# Patient Record
Sex: Female | Born: 1935 | Race: White | Hispanic: No | Smoking: Former smoker
Health system: Southern US, Community
[De-identification: ages and names within clinical notes are randomized; demographics above are authoritative.]

## PROBLEM LIST (undated history)

## (undated) DIAGNOSIS — I5030 Unspecified diastolic (congestive) heart failure: Secondary | ICD-10-CM

## (undated) DIAGNOSIS — E039 Hypothyroidism, unspecified: Secondary | ICD-10-CM

## (undated) DIAGNOSIS — E119 Type 2 diabetes mellitus without complications: Secondary | ICD-10-CM

## (undated) DIAGNOSIS — N189 Chronic kidney disease, unspecified: Secondary | ICD-10-CM

## (undated) DIAGNOSIS — J449 Chronic obstructive pulmonary disease, unspecified: Secondary | ICD-10-CM

## (undated) DIAGNOSIS — F32A Depression, unspecified: Secondary | ICD-10-CM

## (undated) DIAGNOSIS — I255 Ischemic cardiomyopathy: Secondary | ICD-10-CM

## (undated) DIAGNOSIS — I10 Essential (primary) hypertension: Secondary | ICD-10-CM

## (undated) DIAGNOSIS — M5410 Radiculopathy, site unspecified: Secondary | ICD-10-CM

## (undated) DIAGNOSIS — K5792 Diverticulitis of intestine, part unspecified, without perforation or abscess without bleeding: Secondary | ICD-10-CM

## (undated) DIAGNOSIS — M1990 Unspecified osteoarthritis, unspecified site: Secondary | ICD-10-CM

## (undated) DIAGNOSIS — J45909 Unspecified asthma, uncomplicated: Secondary | ICD-10-CM

## (undated) DIAGNOSIS — Z950 Presence of cardiac pacemaker: Secondary | ICD-10-CM

## (undated) DIAGNOSIS — E785 Hyperlipidemia, unspecified: Secondary | ICD-10-CM

## (undated) DIAGNOSIS — I509 Heart failure, unspecified: Secondary | ICD-10-CM

## (undated) DIAGNOSIS — I2510 Atherosclerotic heart disease of native coronary artery without angina pectoris: Secondary | ICD-10-CM

## (undated) DIAGNOSIS — I499 Cardiac arrhythmia, unspecified: Secondary | ICD-10-CM

## (undated) DIAGNOSIS — D649 Anemia, unspecified: Secondary | ICD-10-CM

## (undated) DIAGNOSIS — I4819 Other persistent atrial fibrillation: Secondary | ICD-10-CM

## (undated) DIAGNOSIS — I219 Acute myocardial infarction, unspecified: Secondary | ICD-10-CM

## (undated) DIAGNOSIS — I519 Heart disease, unspecified: Secondary | ICD-10-CM

## (undated) DIAGNOSIS — F329 Major depressive disorder, single episode, unspecified: Secondary | ICD-10-CM

## (undated) DIAGNOSIS — K219 Gastro-esophageal reflux disease without esophagitis: Secondary | ICD-10-CM

## (undated) HISTORY — DX: Atherosclerotic heart disease of native coronary artery without angina pectoris: I25.10

## (undated) HISTORY — DX: Essential (primary) hypertension: I10

## (undated) HISTORY — DX: Type 2 diabetes mellitus without complications: E11.9

## (undated) HISTORY — DX: Acute myocardial infarction, unspecified: I21.9

## (undated) HISTORY — DX: Unspecified diastolic (congestive) heart failure: I50.30

## (undated) HISTORY — DX: Radiculopathy, site unspecified: M54.10

## (undated) HISTORY — DX: Hypothyroidism, unspecified: E03.9

## (undated) HISTORY — PX: JOINT REPLACEMENT: SHX530

## (undated) HISTORY — DX: Hyperlipidemia, unspecified: E78.5

## (undated) HISTORY — DX: Cardiac arrhythmia, unspecified: I49.9

## (undated) HISTORY — DX: Unspecified asthma, uncomplicated: J45.909

## (undated) HISTORY — PX: CARDIAC CATHETERIZATION: SHX172

## (undated) HISTORY — DX: Major depressive disorder, single episode, unspecified: F32.9

## (undated) HISTORY — DX: Depression, unspecified: F32.A

## (undated) HISTORY — DX: Diverticulitis of intestine, part unspecified, without perforation or abscess without bleeding: K57.92

## (undated) HISTORY — DX: Heart failure, unspecified: I50.9

## (undated) HISTORY — DX: Heart disease, unspecified: I51.9

## (undated) HISTORY — PX: FOOT SURGERY: SHX648

## (undated) HISTORY — DX: Unspecified osteoarthritis, unspecified site: M19.90

## (undated) HISTORY — PX: APPENDECTOMY: SHX54

## (undated) HISTORY — PX: ABDOMINAL HYSTERECTOMY: SHX81

## (undated) HISTORY — DX: Chronic obstructive pulmonary disease, unspecified: J44.9

## (undated) HISTORY — DX: Gastro-esophageal reflux disease without esophagitis: K21.9

---

## 1993-12-11 HISTORY — PX: CORONARY ANGIOPLASTY WITH STENT PLACEMENT: SHX49

## 2012-09-11 HISTORY — PX: INSERT / REPLACE / REMOVE PACEMAKER: SUR710

## 2013-06-03 LAB — LIPID PANEL
Cholesterol: 191 mg/dL (ref 0–200)
HDL: 50 mg/dL (ref 35–70)
LDL Cholesterol: 101 mg/dL
Triglycerides: 121 mg/dL (ref 40–160)

## 2013-06-03 LAB — HEPATIC FUNCTION PANEL
ALT: 12 U/L (ref 7–35)
AST: 15 U/L (ref 13–35)
Alkaline Phosphatase: 59 U/L (ref 25–125)
Bilirubin, Total: 0.2 mg/dL

## 2013-06-03 LAB — BASIC METABOLIC PANEL
BUN: 25 mg/dL — AB (ref 4–21)
Creatinine: 1 mg/dL (ref 0.5–1.1)
Glucose: 138 mg/dL
POTASSIUM: 4.1 mmol/L (ref 3.4–5.3)
SODIUM: 133 mmol/L — AB (ref 137–147)

## 2013-06-03 LAB — HEMOGLOBIN A1C: HEMOGLOBIN A1C: 6.6 % — AB (ref 4.0–6.0)

## 2014-03-14 LAB — HM DIABETES EYE EXAM

## 2014-04-20 ENCOUNTER — Ambulatory Visit (INDEPENDENT_AMBULATORY_CARE_PROVIDER_SITE_OTHER): Payer: Medicare Other | Admitting: Nurse Practitioner

## 2014-04-20 ENCOUNTER — Encounter: Payer: Self-pay | Admitting: Nurse Practitioner

## 2014-04-20 VITALS — BP 136/72 | HR 74 | Temp 98.0°F | Resp 14 | Ht 59.0 in | Wt 204.5 lb

## 2014-04-20 DIAGNOSIS — Z7189 Other specified counseling: Secondary | ICD-10-CM

## 2014-04-20 DIAGNOSIS — Z7689 Persons encountering health services in other specified circumstances: Secondary | ICD-10-CM | POA: Insufficient documentation

## 2014-04-20 DIAGNOSIS — E119 Type 2 diabetes mellitus without complications: Secondary | ICD-10-CM | POA: Diagnosis not present

## 2014-04-20 DIAGNOSIS — M5442 Lumbago with sciatica, left side: Secondary | ICD-10-CM | POA: Diagnosis not present

## 2014-04-20 DIAGNOSIS — Z950 Presence of cardiac pacemaker: Secondary | ICD-10-CM

## 2014-04-20 DIAGNOSIS — M25462 Effusion, left knee: Secondary | ICD-10-CM

## 2014-04-20 DIAGNOSIS — E669 Obesity, unspecified: Secondary | ICD-10-CM

## 2014-04-20 DIAGNOSIS — E1169 Type 2 diabetes mellitus with other specified complication: Secondary | ICD-10-CM

## 2014-04-20 NOTE — Progress Notes (Signed)
Pre visit review using our clinic review tool, if applicable. No additional management support is needed unless otherwise documented below in the visit note. 

## 2014-04-20 NOTE — Progress Notes (Signed)
Subjective:    Patient ID: Evelyn Simmons, female    DOB: December 15, 1929, 81 y.o.   MRN: [id number]  HPI  Evelyn Simmons is a 81 yo female establishing care. Is accompanied by daughter.   1) Health Maintenance-   Diet- Balanced meals 3 daily  Exercise- Sedentary, has a cane to walk, walker at home pain and breathing   Immunizations- Unsure  Mammogram- N/A  Bone Density- Unsure   Colonoscopy- N/A  Eye Exam- UTD  Dental Exam- UTD- false teeth  2) Chronic Problems-  Pace maker- 71 BPM is the setting   2 L of Oxygen at night   3) Acute Problems-  Moved from [place], knee pain left knee. Bilateral knee replacement, but left knee has sharp pains and is swollen. She has trouble ambulating because of the pain. She is currently on hydrocodone and gabapentin. Pt also reports bilateral lower back pain and left leg radiation to knee.    Review of Systems  Constitutional: Negative for fever, chills, diaphoresis and fatigue.  Respiratory: Negative for chest tightness, shortness of breath and wheezing.   Cardiovascular: Positive for leg swelling. Negative for chest pain and palpitations.  Gastrointestinal: Negative for nausea, vomiting, abdominal pain and diarrhea.  Musculoskeletal: Positive for back pain, arthralgias and gait problem.       Ambulates with cane Left knee pain  Skin: Negative for rash.  Neurological: Negative for dizziness, weakness, numbness and headaches.  Psychiatric/Behavioral: The patient is not nervous/anxious.    Past Medical History  Diagnosis Date  . Asthma   . Arthritis   . Depression   . Diabetes mellitus without complication   . COPD (chronic obstructive pulmonary disease)   . Diverticulitis   . GERD (gastroesophageal reflux disease)   . Cardiac arrhythmia   . Heart disease   . Hyperlipidemia   . Hypertension   . Hypothyroid     pt unsure, not currently on medication    History   Social History  . Marital Status: Widowed    Spouse Name: N/A  . Number  of Children: N/A  . Years of Education: N/A   Occupational History  . Not on file.   Social History Main Topics  . Smoking status: Former Smoker    Quit date: 12/10/1993  . Smokeless tobacco: Former Systems developer  . Alcohol Use: 0.0 oz/week    0 Standard drinks or equivalent per week     Comment: Rare  . Drug Use: No  . Sexual Activity: Not Currently   Other Topics Concern  . Not on file   Social History Narrative   Moved from [place] with eldest daughter   7 children, 11 grandchildren, 6 GG children        Past Surgical History  Procedure Laterality Date  . Appendectomy    . Abdominal hysterectomy    . Foot surgery Bilateral     Over 25 years ago- Bunionectomy  . Joint replacement      bilateral knee replacements    Family History  Problem Relation Age of Onset  . Arthritis Mother   . Heart disease Mother   . Hypertension Mother   . Diabetes Mother   . Arthritis Father   . Heart disease Father   . Hypertension Father   . Cancer Sister     breast cancer  . Diabetes Brother   . Cancer Daughter     lung cancer  . Diabetes Sister     Allergies  Allergen Reactions  .  Sulfa Antibiotics     Pt unsure reaction, was told in hospital she had allergy  . Penicillins Nausea And Vomiting and Rash    No current outpatient prescriptions on file prior to visit.   No current facility-administered medications on file prior to visit.      Objective:   Physical Exam  Constitutional: She is oriented to person, place, and time. She appears well-developed and well-nourished. No distress.  BP 136/72 mmHg  Pulse 74  Temp(Src) 98 F (36.7 C) (Oral)  Resp 14  Ht 4\' 11"  (1.499 m)  Wt 204 lb 8 oz (92.761 kg)  BMI 41.28 kg/m2  SpO2 95%  HENT:  Head: Normocephalic and atraumatic.  Right Ear: External ear normal.  Left Ear: External ear normal.  Eyes: Right eye exhibits no discharge. Left eye exhibits no discharge. No scleral icterus.  Cardiovascular: Normal rate,  regular rhythm, normal heart sounds and intact distal pulses.  Exam reveals no gallop and no friction rub.   No murmur heard. Pulmonary/Chest: Effort normal. No respiratory distress. She has wheezes. She has no rales. She exhibits no tenderness.  Musculoskeletal: She exhibits edema and tenderness.  Left knee decreased ROM and effusion  Neurological: She is alert and oriented to person, place, and time. No cranial nerve deficit. She exhibits normal muscle tone. Coordination normal.  Skin: Skin is warm and dry. No rash noted. She is not diaphoretic.  Psychiatric: She has a normal mood and affect. Her behavior is normal. Judgment and thought content normal.      Assessment & Plan:

## 2014-04-20 NOTE — Patient Instructions (Addendum)
Welcome to [facility]!   We will contact you with your referrals- Cardiology, Ortho, and Endocrine.   Follow up next month to see how you are doing.

## 2014-04-21 ENCOUNTER — Encounter: Payer: Self-pay | Admitting: Nurse Practitioner

## 2014-04-21 DIAGNOSIS — M5442 Lumbago with sciatica, left side: Secondary | ICD-10-CM | POA: Insufficient documentation

## 2014-04-21 DIAGNOSIS — E1169 Type 2 diabetes mellitus with other specified complication: Secondary | ICD-10-CM | POA: Insufficient documentation

## 2014-04-21 DIAGNOSIS — Z950 Presence of cardiac pacemaker: Secondary | ICD-10-CM | POA: Insufficient documentation

## 2014-04-21 DIAGNOSIS — M25469 Effusion, unspecified knee: Secondary | ICD-10-CM | POA: Insufficient documentation

## 2014-04-21 DIAGNOSIS — E669 Obesity, unspecified: Secondary | ICD-10-CM

## 2014-04-21 DIAGNOSIS — N183 Chronic kidney disease, stage 3 unspecified: Secondary | ICD-10-CM | POA: Insufficient documentation

## 2014-04-21 NOTE — Assessment & Plan Note (Signed)
Discussed acute and chronic issues. Reviewed health maintenance measures, PFSHx, and immunizations. Referrals to Orthopedics, Cardiology, and Endocrinology.

## 2014-04-21 NOTE — Assessment & Plan Note (Addendum)
Needs to establish with a Cardiologist for any pacemaker and heart related needs she may have in the future. She reports having her pacemaker set to 71 BPM

## 2014-04-21 NOTE — Assessment & Plan Note (Signed)
Gait is off due to left knee effusion and pain. Knee was replaced in the past and has been problematic ever since. Referral to Ortho for further evaluation.

## 2014-04-21 NOTE — Assessment & Plan Note (Signed)
Low back pain with left leg radiculopathy with her left knee effusion. Will refer to ortho for further evaluation and treatment.

## 2014-04-21 NOTE — Assessment & Plan Note (Signed)
Pt was seeing an endocrinologist in the past about her diabetes. Will refer to Dr. Howell Rucks for further evaluation and treatment.

## 2014-04-28 ENCOUNTER — Other Ambulatory Visit: Payer: Self-pay | Admitting: *Deleted

## 2014-04-28 ENCOUNTER — Telehealth: Payer: Self-pay | Admitting: *Deleted

## 2014-04-28 MED ORDER — AMLODIPINE BESYLATE 2.5 MG PO TABS: 2.5000 mg | ORAL_TABLET | Freq: Every day | ORAL | Status: DC

## 2014-04-28 MED ORDER — ALPRAZOLAM 0.5 MG PO TABS: 0.5000 mg | ORAL_TABLET | Freq: Two times a day (BID) | ORAL | Status: DC | PRN

## 2014-04-28 NOTE — Telephone Encounter (Signed)
pts daughter called reports pt fell on Sunday night had left knee injury, was seen at [place].

## 2014-04-28 NOTE — Telephone Encounter (Signed)
Thanks for letting me know!

## 2014-05-09 ENCOUNTER — Encounter: Payer: Self-pay | Admitting: Cardiovascular Disease

## 2014-05-09 ENCOUNTER — Ambulatory Visit (INDEPENDENT_AMBULATORY_CARE_PROVIDER_SITE_OTHER): Payer: Medicare Other | Admitting: Cardiovascular Disease

## 2014-05-09 VITALS — BP 152/78 | HR 72 | Ht 59.0 in | Wt 204.8 lb

## 2014-05-09 DIAGNOSIS — Z955 Presence of coronary angioplasty implant and graft: Secondary | ICD-10-CM

## 2014-05-09 DIAGNOSIS — E1169 Type 2 diabetes mellitus with other specified complication: Secondary | ICD-10-CM

## 2014-05-09 DIAGNOSIS — E669 Obesity, unspecified: Secondary | ICD-10-CM

## 2014-05-09 DIAGNOSIS — E785 Hyperlipidemia, unspecified: Secondary | ICD-10-CM

## 2014-05-09 DIAGNOSIS — E119 Type 2 diabetes mellitus without complications: Secondary | ICD-10-CM

## 2014-05-09 DIAGNOSIS — I2510 Atherosclerotic heart disease of native coronary artery without angina pectoris: Secondary | ICD-10-CM | POA: Diagnosis not present

## 2014-05-09 DIAGNOSIS — Z950 Presence of cardiac pacemaker: Secondary | ICD-10-CM | POA: Diagnosis not present

## 2014-05-09 MED ORDER — ROSUVASTATIN CALCIUM 40 MG PO TABS: 40.0000 mg | ORAL_TABLET | Freq: Every day | ORAL | Status: DC

## 2014-05-09 MED ORDER — AMLODIPINE BESYLATE 2.5 MG PO TABS: 2.5000 mg | ORAL_TABLET | Freq: Every day | ORAL | Status: DC

## 2014-05-09 MED ORDER — FUROSEMIDE 40 MG PO TABS: 40.0000 mg | ORAL_TABLET | Freq: Two times a day (BID) | ORAL | Status: DC

## 2014-05-09 MED ORDER — AMIODARONE HCL 200 MG PO TABS: 200.0000 mg | ORAL_TABLET | Freq: Every day | ORAL | Status: DC

## 2014-05-09 NOTE — Progress Notes (Signed)
Patient ID: Evelyn Simmons, female    DOB: [birth year], 81 y.o.   MRN: [id number]  HPI Comments: [name] is a very pleasant 81 year old woman with a history of coronary artery disease dating back to October 1995 at which time she had 2 stents placed to her RCA, 2 additional stents at a later date that she is unaware of the details, bilateral total knee replacements, pacemaker in August 2014 presumably for sick sinus syndrome who presents to establish care in the [place] office.  She denies any significant shortness of breath or chest pain with exertion. She is relatively sedentary given her leg weakness. She has had falls in the past. She walks with a walker, reports having significant discomfort in her left leg with weakness, sciatica. She is scheduled to see orthopedics 05/19/2014. She wonders if it could be her hip. She's not had her pacemaker checked since 2014. It is a Sports coach K-1 [id number] implant August 2014  Daughter who presents with her reports that the patient is very sedentary at baseline, no regular exercise. She recently fell, and because of her injury was in bed for approximately 2 weeks during which time she became very weak. She is interested in PT for left leg weakness, gait instability. She will talk with orthopedics to order this.  EKG on today's visit shows normal sinus rhythm with rate 72 bpm, no significant ST or T-wave changes   Allergies  Allergen Reactions  . Sulfa Antibiotics     Pt unsure reaction, was told in hospital she had allergy  . Penicillins Nausea And Vomiting and Rash    Outpatient Encounter Prescriptions as of 05/09/2014  Medication Sig  . ALPRAZolam (XANAX) 0.5 MG tablet Take 1 tablet (0.5 mg total) by mouth 2 (two) times daily as needed for anxiety.  Marland Kitchen amiodarone (PACERONE) 200 MG tablet Take 1 tablet (200 mg total) by mouth daily.  Marland Kitchen amLODipine (NORVASC) 2.5 MG tablet Take 1 tablet (2.5 mg total) by mouth daily.  Marland Kitchen aspirin EC 81  MG tablet Take 81 mg by mouth daily.  . ferrous sulfate 325 (65 FE) MG tablet Take 325 mg by mouth 3 (three) times daily with meals.  . furosemide (LASIX) 40 MG tablet Take 1 tablet (40 mg total) by mouth 2 (two) times daily.  Marland Kitchen gabapentin (NEURONTIN) 300 MG capsule Take 300 mg by mouth 3 (three) times daily.  . Glucose Blood (PRODIGY AUTOCODE BLOOD GLUCOSE VI) by In Vitro route.  Marland Kitchen HYDROcodone-acetaminophen (NORCO) 10-325 MG per tablet Take 1 tablet by mouth every 6 (six) hours as needed.  . Liraglutide 18 MG/3ML SOPN Inject 0.6 mg into the skin daily.  . pantoprazole (PROTONIX) 40 MG tablet Take 40 mg by mouth daily.  . rosuvastatin (CRESTOR) 40 MG tablet Take 1 tablet (40 mg total) by mouth daily.  . traZODone (DESYREL) 100 MG tablet Take 100 mg by mouth at bedtime.  . [DISCONTINUED] amiodarone (PACERONE) 200 MG tablet Take 200 mg by mouth daily.  . [DISCONTINUED] amLODipine (NORVASC) 2.5 MG tablet Take 1 tablet (2.5 mg total) by mouth daily.  . [DISCONTINUED] furosemide (LASIX) 40 MG tablet Take 40 mg by mouth 2 (two) times daily.  . [DISCONTINUED] rosuvastatin (CRESTOR) 40 MG tablet Take 40 mg by mouth daily.    Past Medical History  Diagnosis Date  . Asthma   . Arthritis   . Depression   . Diabetes mellitus without complication   . COPD (chronic obstructive pulmonary disease)   . Diverticulitis   .  GERD (gastroesophageal reflux disease)   . Cardiac arrhythmia   . Heart disease   . Hyperlipidemia   . Hypertension   . Hypothyroid     pt unsure, not currently on medication  . MI (myocardial infarction)     1995  . Coronary artery disease     Past Surgical History  Procedure Laterality Date  . Appendectomy    . Abdominal hysterectomy    . Foot surgery Bilateral     Over 25 years ago- Bunionectomy  . Joint replacement      bilateral knee replacements  . Insert / replace / remove pacemaker  09/2012    Model # [id number]  serial # [id number]  . Cardiac catheterization    . Coronary  angioplasty with stent placement  12/11/1993    RCA    Social History  reports that she quit smoking about 20 years ago. She has quit using smokeless tobacco. She reports that she drinks alcohol. She reports that she does not use illicit drugs.  Family History family history includes Arthritis in her father and mother; Cancer in her daughter and sister; Diabetes in her brother, mother, and sister; Heart disease in her father and mother; Hypertension in her father and mother.   Review of Systems  Constitutional: Negative.   Respiratory: Negative.   Cardiovascular: Negative.   Gastrointestinal: Negative.   Musculoskeletal: Negative.   Skin: Negative.   Neurological: Negative.   Hematological: Negative.   Psychiatric/Behavioral: Negative.   All other systems reviewed and are negative.   BP 152/78 mmHg  Pulse 72  Ht 4\' 11"  (1.499 m)  Wt 204 lb 12 oz (92.874 kg)  BMI 41.33 kg/m2 Blood pressure with primary care recently was [id number] systolic  Physical Exam  Constitutional: She is oriented to person, place, and time. She appears well-developed and well-nourished.  HENT:  Head: Normocephalic.  Nose: Nose normal.  Mouth/Throat: Oropharynx is clear and moist.  Eyes: Conjunctivae are normal. Pupils are equal, round, and reactive to light.  Neck: Normal range of motion. Neck supple. No JVD present.  Cardiovascular: Normal rate, regular rhythm, S1 normal, S2 normal, normal heart sounds and intact distal pulses.  Exam reveals no gallop and no friction rub.   No murmur heard. Pulmonary/Chest: Effort normal and breath sounds normal. No respiratory distress. She has no wheezes. She has no rales. She exhibits no tenderness.  Abdominal: Soft. Bowel sounds are normal. She exhibits no distension. There is no tenderness.  Musculoskeletal: Normal range of motion. She exhibits no edema or tenderness.  Lymphadenopathy:    She has no cervical adenopathy.  Neurological: She is alert and oriented to  person, place, and time. Coordination normal.  Skin: Skin is warm and dry. No rash noted. No erythema.  Psychiatric: She has a normal mood and affect. Her behavior is normal. Judgment and thought content normal.    Assessment and Plan  Nursing note and vitals reviewed.

## 2014-05-09 NOTE — Patient Instructions (Addendum)
You are doing well. No medication changes were made.  We will schedule a pacemaker check  Call us if you would like to schedule PT Call the office if you would like [place] to order labs  Please call us if you have new issues that need to be addressed before your next appt.  Your physician wants you to follow-up in: 6 months.  You will receive a reminder letter in the mail two months in advance. If you don't receive a letter, please call our office to schedule the follow-up appointment.

## 2014-05-09 NOTE — Assessment & Plan Note (Signed)
Currently with no symptoms of angina. No further workup at this time. Continue current medication regimen. Records have been requested

## 2014-05-09 NOTE — Assessment & Plan Note (Signed)
We will schedule appointment with Dr. Caryl Comes, EP. She has Product/process development scientist. Presumably sick sinus syndrome placed August 2014 prior to stent placement to the RCA in October 2015

## 2014-05-09 NOTE — Assessment & Plan Note (Signed)
We have recommended that she stay on her Crestor. Lab work with primary care. Goal LDL less than 70

## 2014-05-09 NOTE — Assessment & Plan Note (Signed)
2 stents placed to her RCA. Additional stents placed but details are unavailable. We have requested records from [facility]

## 2014-05-09 NOTE — Assessment & Plan Note (Signed)
We have encouraged continued exercise, careful diet management in an effort to lose weight. Follow-up with primary care for medication adjustment if needed

## 2014-05-11 ENCOUNTER — Encounter: Payer: Self-pay | Admitting: Endocrinology

## 2014-05-11 ENCOUNTER — Ambulatory Visit (INDEPENDENT_AMBULATORY_CARE_PROVIDER_SITE_OTHER): Payer: Medicare Other | Admitting: Endocrinology

## 2014-05-11 VITALS — BP 136/78 | HR 73 | Resp 14 | Ht 59.0 in | Wt 201.5 lb

## 2014-05-11 DIAGNOSIS — E669 Obesity, unspecified: Secondary | ICD-10-CM | POA: Diagnosis not present

## 2014-05-11 DIAGNOSIS — E119 Type 2 diabetes mellitus without complications: Secondary | ICD-10-CM | POA: Diagnosis not present

## 2014-05-11 DIAGNOSIS — E785 Hyperlipidemia, unspecified: Secondary | ICD-10-CM | POA: Diagnosis not present

## 2014-05-11 DIAGNOSIS — I2510 Atherosclerotic heart disease of native coronary artery without angina pectoris: Secondary | ICD-10-CM | POA: Diagnosis not present

## 2014-05-11 DIAGNOSIS — E1169 Type 2 diabetes mellitus with other specified complication: Secondary | ICD-10-CM

## 2014-05-11 LAB — HM DIABETES FOOT EXAM: HM DIABETIC FOOT EXAM: NORMAL

## 2014-05-11 NOTE — Patient Instructions (Signed)
Check sugars 2 x daily ( before breakfast and before supper).  Record them in a log book and bring that/meter to next appointment.   Continue current medications for Diabetes- may need adjustments if A1c above target.   Will set up for brand name DM testing suuplies.   Please come back for a follow-up appointment in 4 months

## 2014-05-11 NOTE — Progress Notes (Signed)
Reason for visit-  Evelyn Simmons is a 81 y.o.-year-old female, referred by her PCP,  Doss, Velora Heckler, NP for management of Type 2 diabetes, uncontrolled, without complications. Associated hx of MI s/p stents, arrthymia s/p Pacemaker, cardiomyopathy and diastolic heart failure. Here with daughter.   Moved from [facility] to [place] about 1 month ago. Recently established care with Lorane Gell. Prior to that was seeing Dr Lucianne Lei in [place], [place]. Last seen around July 2015. Notes from Dr Reece Levy reviewed after the clinic visit.   HPI- Patient has been diagnosed with diabetes in 1995 when she had a MI. Recalls being initially on lifestyle modifications.   she has  been on insulin since a few years~2013/2014.   Patient is not sure of which medications she takes for DM. PCP notes mention her being on basal/bolus with lantus/novolog along with Victoza. Unclear what exactly the patient is taking, because she frequently changes her answers.    Pt is currently on a regimen of: - Victoza 0.6 mg Geronimo daily -lantus 10 units Reyno daily -Novolog? taking   Last hemoglobin A1c was: Reports that it was well controlled. 6.6% 05/2013 Lab Results  Component Value Date   HGBA1C 6.5 05/12/2014     Pt checks her sugars 1-2 a day . Uses Prodigy meter- prefers AccuChek glucometer. By meter review they are:  Gets them through [employer] [facility]. Date and time on meter not set right  PREMEAL Breakfast Lunch Dinner Bedtime Overall  Glucose range:     107-233  Mean/median:        POST-MEAL PC Breakfast PC Lunch PC Dinner  Glucose range:     Mean/median:      * higher sugars very occasional with dietary indiscretions.   Hypoglycemia-  No  Recent lows. Lowest sugar was n/a; she has hypoglycemia awareness at 70.  One low reading at night several months  FS 62 After skipping meals   Dietary habits- eats three times daily. Tries to limit carbs, sweetened beverages, sodas, desserts. At her heaviset 318 lbs  Was drinking  15 coke cans week now mainly water after being with daughter for past 3 weeks  Exercise- wheelchair mainly - fell recently and hurt left knee, total knee replacments b/l Weight -  Wt Readings from Last 3 Encounters:  05/11/14 201 lb 8 oz (91.4 kg)  05/09/14 204 lb 12 oz (92.874 kg)  04/20/14 204 lb 8 oz (92.761 kg)    Diabetes Complications- prior AKI secndary to NSAID ; 4 years ago  Nephropathy- Yes, ?stage 3  CKD, last BUN/creatinine- GFR  Lab Results  Component Value Date   BUN 22 05/12/2014   CREATININE 1.13 05/12/2014    05/2013- Creatinine 1.02, GFR 52, BUN 26  Retinopathy- No, Last DEE was in Feb 2016. [place].  Neuropathy- no numbness and tingling in )her feet. No known neuropathy. being treated with neurontin due to back issues/radiculopathy.   Associated history - has CAD and other cardiac hx . No prior stroke. No hypothyroidism. her last TSH was  Lab Results  Component Value Date   TSH 2.75 05/12/2014    Hyperlipidemia-  her last set of lipids were- Currently on Crestor 40. Tolerating well.   Lab Results  Component Value Date   CHOL 184 05/12/2014   HDL 42.10 05/12/2014   LDLCALC 113* 05/12/2014   TRIG 144.0 05/12/2014   CHOLHDL 4 05/12/2014    05/2013: TC 191, TG 121, LDL 101, HDL 50. Normal liver tests  Blood Pressure/HTN-  Patient's blood pressure is well controlled today on current regimen.  Pt has FH of DM in mother, most of siblngs.  I have reviewed the patient's past medical history, family and social history, surgical history, medications and allergies.  Past Medical History  Diagnosis Date  . Asthma   . Arthritis   . Depression   . Diabetes mellitus without complication   . COPD (chronic obstructive pulmonary disease)   . Diverticulitis   . GERD (gastroesophageal reflux disease)   . Cardiac arrhythmia   . Heart disease   . Hyperlipidemia   . Hypertension   . Hypothyroid     pt unsure, not currently on medication  . MI (myocardial  infarction)     1995  . Coronary artery disease   . Diastolic heart failure   . Radiculopathy    Past Surgical History  Procedure Laterality Date  . Appendectomy    . Abdominal hysterectomy    . Foot surgery Bilateral     Over 25 years ago- Bunionectomy  . Joint replacement      bilateral knee replacements  . Insert / replace / remove pacemaker  09/2012    Model # [id number]  serial # [id number]  . Cardiac catheterization    . Coronary angioplasty with stent placement  12/11/1993    RCA   Family History  Problem Relation Age of Onset  . Arthritis Mother   . Heart disease Mother   . Hypertension Mother   . Diabetes Mother   . Arthritis Father   . Heart disease Father   . Hypertension Father   . Cancer Sister     breast cancer  . Diabetes Brother   . Cancer Daughter     lung cancer  . Diabetes Sister    History   Social History  . Marital Status: Widowed    Spouse Name: N/A  . Number of Children: N/A  . Years of Education: N/A   Occupational History  . Not on file.   Social History Main Topics  . Smoking status: Former Smoker    Quit date: 12/10/1993  . Smokeless tobacco: Former Systems developer  . Alcohol Use: 0.0 oz/week    0 Standard drinks or equivalent per week     Comment: Rare  . Drug Use: No  . Sexual Activity: Not Currently   Other Topics Concern  . Not on file   Social History Narrative   Moved from [place] with eldest daughter   7 children, 11 grandchildren, 40 GG children       Current Outpatient Prescriptions on File Prior to Visit  Medication Sig Dispense Refill  . ALPRAZolam (XANAX) 0.5 MG tablet Take 1 tablet (0.5 mg total) by mouth 2 (two) times daily as needed for anxiety. 30 tablet 0  . amiodarone (PACERONE) 200 MG tablet Take 1 tablet (200 mg total) by mouth daily. 90 tablet 3  . amLODipine (NORVASC) 2.5 MG tablet Take 1 tablet (2.5 mg total) by mouth daily. 90 tablet 3  . aspirin EC 81 MG tablet Take 81 mg by mouth daily.    . ferrous  sulfate 325 (65 FE) MG tablet Take 325 mg by mouth 3 (three) times daily with meals.    . furosemide (LASIX) 40 MG tablet Take 1 tablet (40 mg total) by mouth 2 (two) times daily. 180 tablet 3  . gabapentin (NEURONTIN) 300 MG capsule Take 300 mg by mouth 3 (three) times daily.    . Glucose Blood (  PRODIGY AUTOCODE BLOOD GLUCOSE VI) by In Vitro route.    Marland Kitchen HYDROcodone-acetaminophen (NORCO) 10-325 MG per tablet Take 1 tablet by mouth every 6 (six) hours as needed.    . Liraglutide 18 MG/3ML SOPN Inject 0.6 mg into the skin daily.    . pantoprazole (PROTONIX) 40 MG tablet Take 40 mg by mouth daily.    . rosuvastatin (CRESTOR) 40 MG tablet Take 1 tablet (40 mg total) by mouth daily. 90 tablet 3  . traZODone (DESYREL) 100 MG tablet Take 100 mg by mouth at bedtime.     No current facility-administered medications on file prior to visit.   Allergies  Allergen Reactions  . Sulfa Antibiotics     Pt unsure reaction, was told in hospital she had allergy  . Penicillins Nausea And Vomiting and Rash     Review of Systems: [x]  complains of  [  ] denies General:   [  ] Recent weight change [ x ] Fatigue  [  ] Loss of appetite Eyes: [  ]  Vision Difficulty [  ]  Eye pain ENT: [  ]  Hearing difficulty [  ]  Difficulty Swallowing CVS: [  ] Chest pain [  ]  Palpitations/Irregular Heart beat [  ]  Shortness of breath lying flat [ x ] Swelling of legs Resp: [  ] Frequent Cough [  ] Shortness of Breath  [  ]  Wheezing GI: [  ] Heartburn  [  ] Nausea or Vomiting  [  ] Diarrhea [x  ] Constipation  [  ] Abdominal Pain GU: [ x ]  Polyuria  [ x ]  nocturia Bones/joints:  [  ]  Muscle aches  [  ] Joint Pain  [  ] Bone pain Skin/Hair/Nails: [  ]  Rash  [  ] New stretch marks [  ]  Itching [  ] Hair loss [  ]  Excessive hair growth Reproduction: [  ] Low sexual desire , [  ]  Women: Menstrual cycle problems [  ]  Women: Breast Discharge [  ] Men: Difficulty with erections [  ]  Men: Enlarged Breasts CNS: [  ]  Frequent Headaches [  ] Blurry vision [  ] Tremors [  ] Seizures [  ] Loss of consciousness [  ] Localized weakness Endocrine: [ x ]  Excess thirst [  ]  Feeling excessively hot [  ]  Feeling excessively cold Heme: [ x ]  Easy bruising [  ]  Enlarged glands or lumps in neck Allergy: [  ]  Food allergies [  ] Environmental allergies  PE: BP 136/78 mmHg  Pulse 73  Resp 14  Ht 4\' 11"  (1.499 m)  Wt 201 lb 8 oz (91.4 kg)  BMI 40.68 kg/m2  SpO2 95% Wt Readings from Last 3 Encounters:  05/11/14 201 lb 8 oz (91.4 kg)  05/09/14 204 lb 12 oz (92.874 kg)  04/20/14 204 lb 8 oz (92.761 kg)   GENERAL: No acute distress, well developed HEENT:  Eye exam shows normal external appearance. Oral exam shows normal mucosa .  NECK:   Neck exam shows no lymphadenopathy. No Carotids bruits. Thyroid is not enlarged and no nodules felt.  no acanthosis nigricans LUNGS:         Chest is symmetrical. Lungs are clear to auscultation.Marland Kitchen   HEART:         Heart sounds:  S1 and S2 are normal. ?systolic murmur.  ABDOMEN:  No Distention present. Liver and spleen are not palpable. No other mass or tenderness present.  EXTREMITIES:     There is min edema. 2+ DP pulses  NEUROLOGICAL:     Grossly intact.            Diabetic foot exam done with shoes and socks removed: Normal Monofilament testing bilaterally. No deformities of toes.  Right great toe nail dystrophic. Skin normal color. No open wounds. Dry skin.  MUSCULOSKELETAL:       There is no enlargement or gross deformity of the joints.  SKIN:       No rash  ASSESSMENT AND PLAN: Problem List Items Addressed This Visit      Endocrine   Diabetes mellitus type 2 in obese - Primary    Reviewed outside medical records and they were summarized in various sections of the note.  It appears from the sugar log and her last A1c that she has been very well controlled. I explained that given her age and comorbidities, I would aim for a higher A1c closer to 7.5% to reduce risk of  hypoglycemia.  She is past due for labs and will return for fasting labs this week.  At that time, she will also let me know what medications she is taking for Diabetes by looking at her supplies at home.  Will make further adjustments based on labs and corrected medication regimen reported by patient. For now, I have asked her to continue what she is doing. Will set her up for brand Accuchek meter with [facility]. She was asked to check 2 x daily.  Foot care, hypoglycemia, dietary modifications discussed in detail with patient and her daughter.        Relevant Orders   Comprehensive metabolic panel (Completed)   Hemoglobin A1c (Completed)   Microalbumin / creatinine urine ratio (Completed)   Lipid panel (Completed)   TSH (Completed)   T4, free (Completed)     Other   Hyperlipidemia    Continue current statin. Update fasting lipids later this week to assess control.       Relevant Orders   Comprehensive metabolic panel (Completed)   Hemoglobin A1c (Completed)   Microalbumin / creatinine urine ratio (Completed)   Lipid panel (Completed)   TSH (Completed)   T4, free (Completed)       - Return to clinic in 4 mo with sugar log/meter. Sooner if med changes are to be made.  [facility] 05/13/2014 1:41 PM

## 2014-05-11 NOTE — Progress Notes (Signed)
Pre visit review using our clinic review tool, if applicable. No additional management support is needed unless otherwise documented below in the visit note. 

## 2014-05-12 ENCOUNTER — Telehealth: Payer: Self-pay | Admitting: *Deleted

## 2014-05-12 ENCOUNTER — Ambulatory Visit: Payer: TRICARE For Life (TFL) | Admitting: Cardiovascular Disease

## 2014-05-12 ENCOUNTER — Other Ambulatory Visit (INDEPENDENT_AMBULATORY_CARE_PROVIDER_SITE_OTHER): Payer: Medicare Other

## 2014-05-12 DIAGNOSIS — E669 Obesity, unspecified: Secondary | ICD-10-CM

## 2014-05-12 DIAGNOSIS — E119 Type 2 diabetes mellitus without complications: Secondary | ICD-10-CM | POA: Diagnosis not present

## 2014-05-12 DIAGNOSIS — E1169 Type 2 diabetes mellitus with other specified complication: Secondary | ICD-10-CM

## 2014-05-12 DIAGNOSIS — E785 Hyperlipidemia, unspecified: Secondary | ICD-10-CM

## 2014-05-12 LAB — COMPREHENSIVE METABOLIC PANEL
ALT: 12 U/L (ref 0–35)
AST: 18 U/L (ref 0–37)
Albumin: 3.5 g/dL (ref 3.5–5.2)
Alkaline Phosphatase: 57 U/L (ref 39–117)
BILIRUBIN TOTAL: 0.3 mg/dL (ref 0.2–1.2)
BUN: 22 mg/dL (ref 6–23)
CO2: 30 meq/L (ref 19–32)
CREATININE: 1.13 mg/dL (ref 0.40–1.20)
Calcium: 10.4 mg/dL (ref 8.4–10.5)
Chloride: 104 mEq/L (ref 96–112)
GFR: 48.92 mL/min — AB (ref >60.00)
GLUCOSE: 105 mg/dL — AB (ref 70–99)
Potassium: 4.5 mEq/L (ref 3.5–5.1)
SODIUM: 141 meq/L (ref 135–145)
TOTAL PROTEIN: 6.2 g/dL (ref 6.0–8.3)

## 2014-05-12 LAB — LIPID PANEL
CHOLESTEROL: 184 mg/dL (ref 0–200)
HDL: 42.1 mg/dL (ref >39.00)
LDL Cholesterol: 113 mg/dL — ABNORMAL HIGH (ref 0–99)
NONHDL: 141.9
Total CHOL/HDL Ratio: 4
Triglycerides: 144 mg/dL (ref 0.0–149.0)
VLDL: 28.8 mg/dL (ref 0.0–40.0)

## 2014-05-12 LAB — MICROALBUMIN / CREATININE URINE RATIO
Creatinine,U: 109.2 mg/dL
Microalb Creat Ratio: 20.9 mg/g (ref 0.0–30.0)
Microalb, Ur: 22.8 mg/dL — ABNORMAL HIGH (ref 0.0–1.9)

## 2014-05-12 LAB — T4, FREE: Free T4: 0.92 ng/dL (ref 0.60–1.60)

## 2014-05-12 LAB — TSH: TSH: 2.75 u[IU]/mL (ref 0.35–4.50)

## 2014-05-12 LAB — HEMOGLOBIN A1C: HEMOGLOBIN A1C: 6.5 % (ref 4.6–6.5)

## 2014-05-12 NOTE — Telephone Encounter (Signed)
Pt left note with front desk that she is taking Novolog and Victoza every day. Called pt to clarify. States she is not taking Lantus. States she takes Victoza 0.6 mg daily and Novolog at bedtime 10 units if sugar > 150. Takes about 7:00-8:00 pm, without a snack. Denies any low readings overnight. Advised I would call back with any further advice from Dr. Howell Rucks

## 2014-05-13 ENCOUNTER — Encounter: Payer: Self-pay | Admitting: Endocrinology

## 2014-05-13 ENCOUNTER — Encounter: Payer: Self-pay | Admitting: *Deleted

## 2014-05-13 MED ORDER — ACCU-CHEK NANO SMARTVIEW W/DEVICE KIT: PACK | Status: DC

## 2014-05-13 MED ORDER — ACCU-CHEK MULTICLIX LANCETS MISC: Status: DC

## 2014-05-13 NOTE — Telephone Encounter (Signed)
Recent labs reviewed- see annotation.  Please ask her to do the following-  Check sugars 2 x daily.  Continue Victoza 0.6 mg daily.  Stop Novolog.  Donot take lantus.   Follow back in 1 month. Please make appt.  Report back sooner if seeing sugars that are higher than 200 on most occasions.

## 2014-05-13 NOTE — Telephone Encounter (Signed)
Spoke to patient and patient's daughter, verbalized understanding. 1 month appt scheduled 06/15/14

## 2014-05-13 NOTE — Assessment & Plan Note (Signed)
Reviewed outside medical records and they were summarized in various sections of the note.  It appears from the sugar log and her last A1c that she has been very well controlled. I explained that given her age and comorbidities, I would aim for a higher A1c closer to 7.5% to reduce risk of hypoglycemia.  She is past due for labs and will return for fasting labs this week.  At that time, she will also let me know what medications she is taking for Diabetes by looking at her supplies at home.  Will make further adjustments based on labs and corrected medication regimen reported by patient. For now, I have asked her to continue what she is doing. Will set her up for brand Accuchek meter with [facility]. She was asked to check 2 x daily.  Foot care, hypoglycemia, dietary modifications discussed in detail with patient and her daughter.

## 2014-05-13 NOTE — Assessment & Plan Note (Signed)
Continue current statin. Update fasting lipids later this week to assess control.

## 2014-05-16 ENCOUNTER — Ambulatory Visit: Payer: TRICARE For Life (TFL) | Admitting: Nurse Practitioner

## 2014-05-23 ENCOUNTER — Encounter: Payer: Self-pay | Admitting: Nurse Practitioner

## 2014-05-23 ENCOUNTER — Ambulatory Visit (INDEPENDENT_AMBULATORY_CARE_PROVIDER_SITE_OTHER): Payer: Medicare Other | Admitting: Nurse Practitioner

## 2014-05-23 VITALS — BP 118/62 | HR 70 | Temp 94.7°F | Resp 16 | Ht 59.0 in | Wt 206.1 lb

## 2014-05-23 DIAGNOSIS — E119 Type 2 diabetes mellitus without complications: Secondary | ICD-10-CM | POA: Diagnosis not present

## 2014-05-23 DIAGNOSIS — I2510 Atherosclerotic heart disease of native coronary artery without angina pectoris: Secondary | ICD-10-CM

## 2014-05-23 DIAGNOSIS — M25469 Effusion, unspecified knee: Secondary | ICD-10-CM

## 2014-05-23 DIAGNOSIS — E669 Obesity, unspecified: Secondary | ICD-10-CM

## 2014-05-23 DIAGNOSIS — E1169 Type 2 diabetes mellitus with other specified complication: Secondary | ICD-10-CM

## 2014-05-23 DIAGNOSIS — Z7409 Other reduced mobility: Secondary | ICD-10-CM | POA: Diagnosis not present

## 2014-05-23 DIAGNOSIS — M5442 Lumbago with sciatica, left side: Secondary | ICD-10-CM

## 2014-05-23 MED ORDER — HYDROCODONE-ACETAMINOPHEN 10-325 MG PO TABS: 1.0000 | ORAL_TABLET | ORAL | Status: DC | PRN

## 2014-05-23 NOTE — Patient Instructions (Addendum)
We will contact you about your referral for aquatic physical therapy.   We will follow up in 3 months.   We will contact the [facility] program to see if they need anything on our end.

## 2014-05-23 NOTE — Progress Notes (Signed)
Subjective:    Patient ID: Evelyn Simmons, female    DOB: [birth year], 81 y.o.   MRN: [id number]  HPI  Ms. Schinke is a 81 yo female following up for Diabetes, knee effusion, and pacemaker. She is with her daughter today.   1) Referred to ortho- Back was the main problem, lower back   Only using wheelchair Trying to get into [facility]  Already completed orientation, currently working with Education officer, museum, wants me to speak with Binnie Kand (spelling?)  Cardiology- Dr. Rockey Situ w/ EKG  Still on Crestor And Endocrinology- Dr. Howell Rucks  2) Xanax- This week last dose, cannot remember date  Norco- daily, states it stops working after 4 hours   3) Diet- Not drinking sweet drinks, no white bread or potatoes  Still eating pie, cake  Wt Readings from Last 3 Encounters:  05/23/14 206 lb 1.9 oz (93.495 kg)  05/11/14 201 lb 8 oz (91.4 kg)  05/09/14 204 lb 12 oz (92.874 kg)   Mobility- None, lying in the bed most of the time due to pain from back and into leg.   Review of Systems  Constitutional: Negative for fever, chills, diaphoresis, appetite change and fatigue.  Respiratory: Negative for chest tightness, shortness of breath and wheezing.   Cardiovascular: Negative for chest pain, palpitations and leg swelling.  Gastrointestinal: Negative for nausea, vomiting and diarrhea.  Musculoskeletal: Positive for back pain and arthralgias.  Skin: Negative for rash.  Neurological: Negative for dizziness, weakness, numbness and headaches.  Psychiatric/Behavioral: The patient is nervous/anxious.    Past Medical History  Diagnosis Date  . Asthma   . Arthritis   . Depression   . Diabetes mellitus without complication   . COPD (chronic obstructive pulmonary disease)   . Diverticulitis   . GERD (gastroesophageal reflux disease)   . Cardiac arrhythmia   . Heart disease   . Hyperlipidemia   . Hypertension   . Hypothyroid     pt unsure, not currently on medication  . MI (myocardial infarction)    1995  . Coronary artery disease   . Diastolic heart failure   . Radiculopathy     History   Social History  . Marital Status: Widowed    Spouse Name: N/A  . Number of Children: N/A  . Years of Education: N/A   Occupational History  . Not on file.   Social History Main Topics  . Smoking status: Former Smoker    Quit date: 12/10/1993  . Smokeless tobacco: Former Systems developer  . Alcohol Use: 0.0 oz/week    0 Standard drinks or equivalent per week     Comment: Rare  . Drug Use: No  . Sexual Activity: Not Currently   Other Topics Concern  . Not on file   Social History Narrative   Moved from [place] with eldest daughter   7 children, 11 grandchildren, 18 GG children        Past Surgical History  Procedure Laterality Date  . Appendectomy    . Abdominal hysterectomy    . Foot surgery Bilateral     Over 25 years ago- Bunionectomy  . Joint replacement      bilateral knee replacements  . Insert / replace / remove pacemaker  09/2012    Model # [id number]  serial # [id number]  . Cardiac catheterization    . Coronary angioplasty with stent placement  12/11/1993    RCA    Family History  Problem Relation Age of Onset  .  Arthritis Mother   . Heart disease Mother   . Hypertension Mother   . Diabetes Mother   . Arthritis Father   . Heart disease Father   . Hypertension Father   . Cancer Sister     breast cancer  . Diabetes Brother   . Cancer Daughter     lung cancer  . Diabetes Sister     Allergies  Allergen Reactions  . Sulfa Antibiotics     Pt unsure reaction, was told in hospital she had allergy  . Penicillins Nausea And Vomiting and Rash    Current Outpatient Prescriptions on File Prior to Visit  Medication Sig Dispense Refill  . ALPRAZolam (XANAX) 0.5 MG tablet Take 1 tablet (0.5 mg total) by mouth 2 (two) times daily as needed for anxiety. 30 tablet 0  . amiodarone (PACERONE) 200 MG tablet Take 1 tablet (200 mg total) by mouth daily. 90 tablet 3  . amLODipine  (NORVASC) 2.5 MG tablet Take 1 tablet (2.5 mg total) by mouth daily. 90 tablet 3  . aspirin EC 81 MG tablet Take 81 mg by mouth daily.    . Blood Glucose Monitoring Suppl (ACCU-CHEK NANO SMARTVIEW) W/DEVICE KIT Please issue nano meter by AccuChek along with test strips at checking frequency of 2x daily. 90 day supply. E11.9 1 kit 1  . ferrous sulfate 325 (65 FE) MG tablet Take 325 mg by mouth 3 (three) times daily with meals.    . furosemide (LASIX) 40 MG tablet Take 1 tablet (40 mg total) by mouth 2 (two) times daily. 180 tablet 3  . gabapentin (NEURONTIN) 300 MG capsule Take 300 mg by mouth 3 (three) times daily.    . Glucose Blood (PRODIGY AUTOCODE BLOOD GLUCOSE VI) by In Vitro route.    . Lancets (ACCU-CHEK MULTICLIX) lancets Test sugars twice daily 200 each 2  . pantoprazole (PROTONIX) 40 MG tablet Take 40 mg by mouth daily.    . rosuvastatin (CRESTOR) 40 MG tablet Take 1 tablet (40 mg total) by mouth daily. 90 tablet 3  . traZODone (DESYREL) 100 MG tablet Take 100 mg by mouth at bedtime.     No current facility-administered medications on file prior to visit.       Objective:   Physical Exam  Constitutional: She is oriented to person, place, and time. She appears well-developed and well-nourished. No distress.  BP 118/62 mmHg  Pulse 70  Temp(Src) 94.7 F (34.8 C) (Oral)  Resp 16  Ht _0  (1.499 m)  Wt 206 lb 1.9 oz (93.495 kg)  BMI 41.61 kg/m2  SpO2 94%   HENT:  Head: Normocephalic and atraumatic.  Right Ear: External ear normal.  Left Ear: External ear normal.  Eyes: Right eye exhibits no discharge. Left eye exhibits no discharge. No scleral icterus.  Neck: Normal range of motion. Neck supple.  Cardiovascular: Normal rate, regular rhythm and normal heart sounds.  Exam reveals no gallop and no friction rub.   No murmur heard. Pulmonary/Chest: Effort normal and breath sounds normal. No respiratory distress. She has no wheezes. She has no rales. She exhibits no tenderness.    Musculoskeletal: She exhibits edema and tenderness.  Knees swollen from OA.   Lymphadenopathy:    She has no cervical adenopathy.  Neurological: She is alert and oriented to person, place, and time. No cranial nerve deficit. She exhibits normal muscle tone. Coordination normal.  Skin: Skin is warm and dry. No rash noted. She is not diaphoretic.  Psychiatric: She has  a normal mood and affect. Her behavior is normal. Judgment and thought content normal.      Assessment & Plan:

## 2014-05-23 NOTE — Progress Notes (Signed)
Pre visit review using our clinic review tool, if applicable. No additional management support is needed unless otherwise documented below in the visit note. 

## 2014-05-29 DIAGNOSIS — Z7409 Other reduced mobility: Secondary | ICD-10-CM | POA: Insufficient documentation

## 2014-05-29 NOTE — Assessment & Plan Note (Signed)
Seeing ortho.   

## 2014-05-29 NOTE — Assessment & Plan Note (Signed)
Referring for aquatic physical therapy.

## 2014-05-29 NOTE — Assessment & Plan Note (Signed)
Seeing Dr. Howell Rucks for endocrinology work up. Not drinking sweet drinks, cut out white bread and potatoes, likes pie and cake.

## 2014-05-29 NOTE — Assessment & Plan Note (Addendum)
Still present. Will refill Norco and move from every 6 to every 4 hours. She reports ortho said there was nothing they can do for her.

## 2014-05-30 ENCOUNTER — Other Ambulatory Visit: Payer: Self-pay | Admitting: *Deleted

## 2014-05-30 MED ORDER — FREESTYLE LANCETS MISC: Status: DC

## 2014-05-30 MED ORDER — GLUCOSE BLOOD VI STRP: ORAL_STRIP | Status: DC

## 2014-06-03 ENCOUNTER — Telehealth: Payer: Self-pay

## 2014-06-03 NOTE — Telephone Encounter (Signed)
The pt's daughter dropped off a handicap parking request on 4/22 at 3:09

## 2014-06-06 NOTE — Telephone Encounter (Signed)
Noted. Given to NP.

## 2014-06-09 ENCOUNTER — Telehealth: Payer: Self-pay | Admitting: Nurse Practitioner

## 2014-06-09 NOTE — Telephone Encounter (Signed)
[facility] called to notify the patient has been admitted to the [facility] program as of May 1st. If you have any question please call Butch Penny at [phone].

## 2014-06-10 NOTE — Telephone Encounter (Signed)
Thanks! Handicap parking pass is ready to be picked up.

## 2014-06-10 NOTE — Telephone Encounter (Signed)
Message left paperwork is complete and ready to be picked up.

## 2014-06-15 ENCOUNTER — Ambulatory Visit: Payer: Medicare Other | Admitting: Endocrinology

## 2014-06-23 ENCOUNTER — Encounter: Payer: Self-pay | Admitting: Nurse Practitioner

## 2014-06-27 ENCOUNTER — Ambulatory Visit: Payer: Medicare Other | Admitting: Physical Therapy

## 2014-06-28 ENCOUNTER — Encounter: Payer: Self-pay | Admitting: Internal Medicine

## 2014-06-28 ENCOUNTER — Ambulatory Visit (INDEPENDENT_AMBULATORY_CARE_PROVIDER_SITE_OTHER): Payer: PRIVATE HEALTH INSURANCE | Admitting: Internal Medicine

## 2014-06-28 ENCOUNTER — Other Ambulatory Visit: Payer: Self-pay

## 2014-06-28 VITALS — BP 112/78 | HR 67 | Ht 59.0 in | Wt 204.2 lb

## 2014-06-28 DIAGNOSIS — I480 Paroxysmal atrial fibrillation: Secondary | ICD-10-CM

## 2014-06-28 DIAGNOSIS — I5030 Unspecified diastolic (congestive) heart failure: Secondary | ICD-10-CM

## 2014-06-28 DIAGNOSIS — I509 Heart failure, unspecified: Secondary | ICD-10-CM

## 2014-06-28 DIAGNOSIS — I2510 Atherosclerotic heart disease of native coronary artery without angina pectoris: Secondary | ICD-10-CM

## 2014-06-28 DIAGNOSIS — Z950 Presence of cardiac pacemaker: Secondary | ICD-10-CM | POA: Diagnosis not present

## 2014-06-28 DIAGNOSIS — I495 Sick sinus syndrome: Secondary | ICD-10-CM | POA: Diagnosis not present

## 2014-06-28 DIAGNOSIS — I25119 Atherosclerotic heart disease of native coronary artery with unspecified angina pectoris: Secondary | ICD-10-CM | POA: Diagnosis not present

## 2014-06-28 LAB — CUP PACEART INCLINIC DEVICE CHECK
Brady Statistic RA Percent Paced: 64 %
Date Time Interrogation Session: 20160517040000
Lead Channel Impedance Value: 445 Ohm
Lead Channel Pacing Threshold Amplitude: 0.6 V
Lead Channel Pacing Threshold Amplitude: 0.6 V
Lead Channel Pacing Threshold Pulse Width: 0.4 ms
Lead Channel Pacing Threshold Pulse Width: 0.5 ms
Lead Channel Sensing Intrinsic Amplitude: 12 mV
Lead Channel Setting Pacing Amplitude: 2 V
Lead Channel Setting Sensing Sensitivity: 2.5 mV
MDC IDC MSMT LEADCHNL RA IMPEDANCE VALUE: 502 Ohm
MDC IDC MSMT LEADCHNL RA SENSING INTR AMPL: 4.4 mV
MDC IDC SET LEADCHNL RV PACING AMPLITUDE: 1.4 V
MDC IDC SET LEADCHNL RV PACING PULSEWIDTH: 0.4 ms
MDC IDC STAT BRADY RV PERCENT PACED: 1 % — AB
Pulse Gen Serial Number: 161924
Zone Setting Detection Interval: 375 ms

## 2014-06-28 NOTE — Progress Notes (Signed)
ELECTROPHYSIOLOGY CONSULT NOTE  Patient ID: Evelyn Simmons, MRN: [id number], DOB/AGE: 1929-10-25 81 y.o. Admit date: (Not on file) Date of Consult: 06/28/2014  Primary Physician: Rubbie Battiest, NP Primary Cardiologist: TG  Chief Complaint: establish   HPI Evelyn Simmons is a 81 y.o. 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 They described atrial fibrillation for which ELIQUIS was started and then subsequently discontinued for reasons that are not clear. This described anemia with iron replacement therapy.  She has a history of coronary artery disease with a history of stenting in 1995. She last underwent catheterization 12/11 at which point she had high-grade stenosis of her RCA stented. Echocardiograms had demonstrated normal LV function variably and questions of aortic stenosis. Most recent echo 2013 was read as no aortic stenosis and moderate MR. She has dyspnea on exertion.         Past Medical History  Diagnosis Date  . Asthma   . Arthritis   . Depression   . Diabetes mellitus without complication   . COPD (chronic obstructive pulmonary disease)   . Diverticulitis   . GERD (gastroesophageal reflux disease)   . Cardiac arrhythmia   . Heart disease   . Hyperlipidemia   . Hypertension   . Hypothyroid     pt unsure, not currently on medication  . MI (myocardial infarction)     1995  . Coronary artery disease   . Diastolic heart failure   . Radiculopathy       Surgical History:  Past Surgical History  Procedure Laterality Date  . Appendectomy    . Abdominal hysterectomy    . Foot surgery Bilateral     Over 25 years ago- Bunionectomy  . Joint replacement      bilateral knee replacements  . Insert / replace / remove pacemaker  09/2012    Model # [id number]  serial # [id number]  . Cardiac catheterization    . Coronary angioplasty with stent placement  12/11/1993    RCA     Home  Meds: Prior to Admission medications   Medication Sig Start Date End Date Taking? Authorizing Provider  ALPRAZolam Duanne Moron) 0.5 MG tablet Take 1 tablet (0.5 mg total) by mouth 2 (two) times daily as needed for anxiety. 04/28/14   Rubbie Battiest, NP  amiodarone (PACERONE) 200 MG tablet Take 1 tablet (200 mg total) by mouth daily. 05/09/14   Minna Merritts, MD  amLODipine (NORVASC) 2.5 MG tablet Take 1 tablet (2.5 mg total) by mouth daily. 05/09/14   Minna Merritts, MD  aspirin EC 81 MG tablet Take 81 mg by mouth daily.    Historical Provider, MD  Blood Glucose Monitoring Suppl (ACCU-CHEK NANO SMARTVIEW) W/DEVICE KIT Please issue nano meter by AccuChek along with test strips at checking frequency of 2x daily. 90 day supply. E11.9 05/13/14   Haydee Monica, MD  ferrous sulfate 325 (65 FE) MG tablet Take 325 mg by mouth 3 (three) times daily with meals.    Historical Provider, MD  furosemide (LASIX) 40 MG tablet Take 1 tablet (40 mg total) by mouth 2 (two) times daily. 05/09/14   Minna Merritts, MD  gabapentin (NEURONTIN) 300 MG capsule Take 300 mg by mouth 3 (three) times daily.    Historical Provider, MD  glucose blood (FREESTYLE LITE) test strip Use as instructed 05/30/14   Rubbie Battiest, NP  HYDROcodone-acetaminophen [facility]) [id number]  MG per tablet Take 1 tablet by mouth every 4 (four) hours as needed. 05/23/14   Rubbie Battiest, NP  Lancets (FREESTYLE) lancets Use as instructed 05/30/14   Rubbie Battiest, NP  pantoprazole (PROTONIX) 40 MG tablet Take 40 mg by mouth daily.    Historical Provider, MD  rosuvastatin (CRESTOR) 40 MG tablet Take 1 tablet (40 mg total) by mouth daily. 05/09/14   Minna Merritts, MD  traZODone (DESYREL) 100 MG tablet Take 100 mg by mouth at bedtime.    Historical Provider, MD    Allergies:  Allergies  Allergen Reactions  . Sulfa Antibiotics     Pt unsure reaction, was told in hospital she had allergy  . Penicillins Nausea And Vomiting and Rash    History   Social History    . Marital Status: Widowed    Spouse Name: N/A  . Number of Children: N/A  . Years of Education: N/A   Occupational History  . Not on file.   Social History Main Topics  . Smoking status: Former Smoker    Quit date: 12/10/1993  . Smokeless tobacco: Former Systems developer  . Alcohol Use: 0.0 oz/week    0 Standard drinks or equivalent per week     Comment: Rare  . Drug Use: No  . Sexual Activity: Not Currently   Other Topics Concern  . Not on file   Social History Narrative   Moved from [place] with eldest daughter   7 children, 11 grandchildren, 63 GG children         Family History  Problem Relation Age of Onset  . Arthritis Mother   . Heart disease Mother   . Hypertension Mother   . Diabetes Mother   . Arthritis Father   . Heart disease Father   . Hypertension Father   . Cancer Sister     breast cancer  . Diabetes Brother   . Cancer Daughter     lung cancer  . Diabetes Sister      ROS:  Please see the history of present illness.  No ear All other systems reviewed and negative.    Physical Exam:   Blood pressure 112/78, pulse 67, height _0  (1.499 m), weight 204 lb 4 oz (92.647 kg). General: Well developed, well nourished female in no acute distress. Head: Normocephalic, atraumatic, sclera non-icteric, no xanthomas, nares are without discharge. EENT: normal Lymph Nodes:  none Back: without scoliosis/kyphosis , no CVA tendersness Neck: Negative for carotid bruits. JVD not elevated. Lungs: Clear bilaterally to auscultation without wheezes, rales, or rhonchi. Breathing is unlabored. Heart: RRR with S1 S2. 2 /6 systolic  murmur , rubs, or gallops appreciated. Abdomen: Soft, non-tender, non-distended with normoactive bowel sounds. No hepatomegaly. No rebound/guarding. No obvious abdominal masses. Msk:  Strength and tone appear normal for age. Extremities: No clubbing or cyanosis. tr edema.  Distal pedal pulses are 2+ and equal bilaterally. Skin: Warm and  Dry Neuro: Alert and oriented X 3. CN III-XII intact Grossly normal sensory and motor function . Psych:  Responds to questions appropriately with a normal affect.      Labs: Cardiac Enzymes No results for input(s): CKTOTAL, CKMB, TROPONINI in the last 72 hours. CBC No results found for: WBC, HGB, HCT, MCV, PLT PROTIME: No results for input(s): LABPROT, INR in the last 72 hours. Chemistry No results for input(s): NA, K, CL, CO2, BUN, CREATININE, CALCIUM, PROT, BILITOT, ALKPHOS, ALT, AST, GLUCOSE in the last 168 hours.  Invalid input(s): LABALBU Lipids Lab Results  Component Value Date   CHOL 184 05/12/2014   HDL 42.10 05/12/2014   LDLCALC 113* 05/12/2014   TRIG 144.0 05/12/2014   BNP No results found for: PROBNP Thyroid Function Tests: No results for input(s): TSH, T4TOTAL, T3FREE, THYROIDAB in the last 72 hours.  Invalid input(s): FREET3    Miscellaneous No results found for: DDIMER  Radiology/Studies:  No results found.  EKG: sinus 72  21/11/39 IMI   Assessment and Plan  Sinus node dysfunction  Pacemaker-[place]  Atrial fibrillation  Ischemic heart disease with prior stenting  HFpEF    The patient is euvolemic. We will not adjust her medications. There is no evidence of atrial fibrillation. Hence, while is not clear to me why her apixaban was stopped previously, we will leave her on her aspirin for now. In the event that she has interval atrial fibrillation, we will resume anticoagulation.  Without symptoms of ischemia  Stable sinus node dysfunction she is very symptomatic bradycardia   She is on amiodarone presumably for atrial fibrillation. LFTs and thyroid function testing were normal 3/16 I would be inclined at the next visit to discontinue it   The patient is euvolemic. Virl Axe

## 2014-06-28 NOTE — Patient Instructions (Signed)
Medication Instructions:  Your physician recommends that you continue on your current medications as directed. Please refer to the Current Medication list given to you today.   Labwork: None  Testing/Procedures: Remote monitoring is used to monitor your Pacemaker of ICD from home. This monitoring reduces the number of office visits required to check your device to one time per year. It allows [place] to keep an eye on the functioning of your device to ensure it is working properly. You are scheduled for a device check from home on September 27, 2014. You may send your transmission at any time that day. If you have a wireless device, the transmission will be sent automatically. After your physician reviews your transmission, you will receive a postcard with your next transmission date.    Follow-Up: Your physician wants you to follow-up in: ONE YEAR with Dr. Caryl Comes.  You will receive a reminder letter in the mail two months in advance. If you don't receive a letter, please call our office to schedule the follow-up appointment.   Any Other Special Instructions Will Be Listed Below (If Applicable).

## 2014-06-29 ENCOUNTER — Telehealth: Payer: Self-pay

## 2014-06-29 NOTE — Telephone Encounter (Signed)
Pt sister called, states when pt was in the office yesterday, she was supposed to get a "cable to check her device", but she did not get it. Please call.

## 2014-06-29 NOTE — Telephone Encounter (Signed)
Daughter made aware that monitor cell adapter and power cord were ordered and will be arriving by mail. Understanding verbalized.

## 2014-06-30 ENCOUNTER — Encounter: Payer: Medicare Other | Admitting: Physical Therapy

## 2014-07-04 ENCOUNTER — Encounter: Payer: Medicare Other | Admitting: Physical Therapy

## 2014-07-07 ENCOUNTER — Encounter: Payer: Medicare Other | Admitting: Physical Therapy

## 2014-08-29 ENCOUNTER — Ambulatory Visit: Payer: Medicare Other | Admitting: Nurse Practitioner

## 2014-09-07 ENCOUNTER — Ambulatory Visit: Payer: Medicare Other | Admitting: Endocrinology

## 2014-09-27 ENCOUNTER — Ambulatory Visit (INDEPENDENT_AMBULATORY_CARE_PROVIDER_SITE_OTHER): Payer: PRIVATE HEALTH INSURANCE | Admitting: *Deleted

## 2014-09-27 DIAGNOSIS — I495 Sick sinus syndrome: Secondary | ICD-10-CM

## 2014-09-27 NOTE — Progress Notes (Signed)
Remote pacemaker transmission.   

## 2014-10-04 LAB — CUP PACEART REMOTE DEVICE CHECK
Battery Remaining Percentage: 100 %
Brady Statistic RA Percent Paced: 86 %
Date Time Interrogation Session: 20160816040200
Lead Channel Pacing Threshold Pulse Width: 0.4 ms
Lead Channel Pacing Threshold Pulse Width: 0.5 ms
Lead Channel Setting Pacing Amplitude: 1.3 V
Lead Channel Setting Pacing Pulse Width: 0.4 ms
Lead Channel Setting Sensing Sensitivity: 2.5 mV
MDC IDC MSMT BATTERY REMAINING LONGEVITY: 78 mo
MDC IDC MSMT LEADCHNL RA IMPEDANCE VALUE: 397 Ohm
MDC IDC MSMT LEADCHNL RA PACING THRESHOLD AMPLITUDE: 0.6 V
MDC IDC MSMT LEADCHNL RV IMPEDANCE VALUE: 394 Ohm
MDC IDC MSMT LEADCHNL RV PACING THRESHOLD AMPLITUDE: 0.9 V
MDC IDC PG SERIAL: 161924
MDC IDC SET LEADCHNL RA PACING AMPLITUDE: 2 V
MDC IDC STAT BRADY RV PERCENT PACED: 0 %
Zone Setting Detection Interval: 375 ms

## 2014-10-21 ENCOUNTER — Encounter: Payer: Self-pay | Admitting: Cardiology

## 2014-11-01 ENCOUNTER — Encounter: Payer: Self-pay | Admitting: Internal Medicine

## 2014-12-29 ENCOUNTER — Ambulatory Visit (INDEPENDENT_AMBULATORY_CARE_PROVIDER_SITE_OTHER): Payer: PRIVATE HEALTH INSURANCE | Admitting: *Deleted

## 2014-12-29 DIAGNOSIS — I495 Sick sinus syndrome: Secondary | ICD-10-CM | POA: Diagnosis not present

## 2014-12-29 NOTE — Progress Notes (Signed)
Remote pacemaker transmission.   

## 2015-01-10 LAB — CUP PACEART REMOTE DEVICE CHECK
Battery Remaining Longevity: 78 mo
Brady Statistic RA Percent Paced: 82 %
Date Time Interrogation Session: 20161117050100
Implantable Lead Location: 753859
Implantable Lead Location: 753860
Implantable Lead Model: 4469
Implantable Lead Serial Number: 581162
Lead Channel Impedance Value: 406 Ohm
Lead Channel Pacing Threshold Amplitude: 0.8 V
Lead Channel Pacing Threshold Pulse Width: 0.4 ms
Lead Channel Sensing Intrinsic Amplitude: 11.6 mV
Lead Channel Setting Pacing Amplitude: 2 V
Lead Channel Setting Pacing Pulse Width: 0.4 ms
MDC IDC LEAD IMPLANT DT: 20140813
MDC IDC LEAD IMPLANT DT: 20140813
MDC IDC LEAD MODEL: 4470
MDC IDC LEAD SERIAL: 755425
MDC IDC MSMT BATTERY REMAINING PERCENTAGE: 100 %
MDC IDC MSMT LEADCHNL RA IMPEDANCE VALUE: 367 Ohm
MDC IDC SET LEADCHNL RV PACING AMPLITUDE: 1.3 V
MDC IDC SET LEADCHNL RV SENSING SENSITIVITY: 2.5 mV
MDC IDC STAT BRADY RV PERCENT PACED: 0 %
Pulse Gen Serial Number: 161924

## 2015-01-11 ENCOUNTER — Encounter: Payer: Self-pay | Admitting: Cardiology

## 2015-03-30 ENCOUNTER — Ambulatory Visit (INDEPENDENT_AMBULATORY_CARE_PROVIDER_SITE_OTHER): Payer: PRIVATE HEALTH INSURANCE | Admitting: *Deleted

## 2015-03-30 DIAGNOSIS — I495 Sick sinus syndrome: Secondary | ICD-10-CM | POA: Diagnosis not present

## 2015-03-30 NOTE — Progress Notes (Signed)
Remote pacemaker transmission.   

## 2015-04-16 LAB — CUP PACEART REMOTE DEVICE CHECK
Battery Remaining Longevity: 72 mo
Brady Statistic RA Percent Paced: 89 %
Brady Statistic RV Percent Paced: 0 %
Implantable Lead Implant Date: 20140813
Implantable Lead Location: 753860
Implantable Lead Model: 4469
Implantable Lead Model: 4470
Implantable Lead Serial Number: 581162
Implantable Lead Serial Number: 755425
Lead Channel Impedance Value: 404 Ohm
Lead Channel Impedance Value: 411 Ohm
Lead Channel Pacing Threshold Amplitude: 0.4 V
Lead Channel Pacing Threshold Amplitude: 0.8 V
Lead Channel Pacing Threshold Pulse Width: 0.4 ms
Lead Channel Setting Pacing Amplitude: 1.2 V
Lead Channel Setting Sensing Sensitivity: 2.5 mV
MDC IDC LEAD IMPLANT DT: 20140813
MDC IDC LEAD LOCATION: 753859
MDC IDC MSMT BATTERY REMAINING PERCENTAGE: 100 %
MDC IDC MSMT LEADCHNL RA PACING THRESHOLD PULSEWIDTH: 0.5 ms
MDC IDC PG SERIAL: 161924
MDC IDC SESS DTM: 20170216054900
MDC IDC SET LEADCHNL RA PACING AMPLITUDE: 2 V
MDC IDC SET LEADCHNL RV PACING PULSEWIDTH: 0.4 ms

## 2015-04-16 NOTE — Progress Notes (Signed)
Normal remote reviewed.  Next follow up 06/2015 in clinic.  

## 2015-04-26 ENCOUNTER — Encounter: Payer: Self-pay | Admitting: Cardiology

## 2015-09-04 ENCOUNTER — Ambulatory Visit (INDEPENDENT_AMBULATORY_CARE_PROVIDER_SITE_OTHER): Payer: PRIVATE HEALTH INSURANCE | Admitting: *Deleted

## 2015-09-04 DIAGNOSIS — I495 Sick sinus syndrome: Secondary | ICD-10-CM

## 2015-09-04 NOTE — Progress Notes (Signed)
Remote pacemaker transmission.   

## 2015-09-06 ENCOUNTER — Encounter: Payer: Self-pay | Admitting: Cardiology

## 2015-09-06 LAB — CUP PACEART REMOTE DEVICE CHECK
Battery Remaining Longevity: 66 mo
Battery Remaining Percentage: 100 %
Brady Statistic RA Percent Paced: 82 %
Brady Statistic RV Percent Paced: 0 %
Date Time Interrogation Session: 20170723051600
Implantable Lead Implant Date: 20140813
Implantable Lead Location: 753860
Implantable Lead Model: 4469
Lead Channel Impedance Value: 442 Ohm
Lead Channel Pacing Threshold Amplitude: 0.9 V
Lead Channel Pacing Threshold Pulse Width: 0.4 ms
Lead Channel Setting Pacing Amplitude: 2 V
Lead Channel Setting Pacing Pulse Width: 0.4 ms
Lead Channel Setting Sensing Sensitivity: 2.5 mV
MDC IDC LEAD IMPLANT DT: 20140813
MDC IDC LEAD LOCATION: 753859
MDC IDC LEAD MODEL: 4470
MDC IDC LEAD SERIAL: 581162
MDC IDC LEAD SERIAL: 755425
MDC IDC MSMT LEADCHNL RA PACING THRESHOLD AMPLITUDE: 0.4 V
MDC IDC MSMT LEADCHNL RA PACING THRESHOLD PULSEWIDTH: 0.5 ms
MDC IDC MSMT LEADCHNL RV IMPEDANCE VALUE: 407 Ohm
MDC IDC PG SERIAL: 161924
MDC IDC SET LEADCHNL RV PACING AMPLITUDE: 1.4 V

## 2015-09-28 ENCOUNTER — Telehealth: Payer: Self-pay | Admitting: *Deleted

## 2015-09-28 NOTE — Telephone Encounter (Signed)
LMTCB/sss  Patient needs appt for PAF.

## 2015-10-02 NOTE — Telephone Encounter (Signed)
[facility] requesting call back.  Gave device clinic phone number.  Patient is scheduled for an appointment in [place] with Dr. Caryl Comes on 10/03/15 at 8:45am.

## 2015-10-02 NOTE — Telephone Encounter (Signed)
Pt daughter calling asking about upcoming appointment  She states everything is to go through pace Please call back.

## 2015-10-03 ENCOUNTER — Encounter: Payer: TRICARE For Life (TFL) | Admitting: Internal Medicine

## 2015-10-03 ENCOUNTER — Encounter: Payer: Self-pay | Admitting: *Deleted

## 2015-10-03 NOTE — Progress Notes (Deleted)
Patient Care Team: Rubbie Battiest, NP as PCP - General (Nurse Practitioner)   HPI  Evelyn Simmons is a 82 y.o. 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 They described atrial fibrillation for which ELIQUIS was started and then subsequently discontinued for reasons that are not clear. This described anemia with iron replacement therapy.  She has a history of coronary artery disease with a history of stenting in 1995. She last underwent catheterization 12/11 at which point she had high-grade stenosis of her RCA stented. Echocardiograms had demonstrated normal LV function variably and questions of aortic stenosis. Most recent echo 2013 was read as no aortic stenosis and moderate MR. She has dyspnea on exertion. Records and Results Reviewed***  Past Medical History:  Diagnosis Date  . Arthritis   . Asthma   . Cardiac arrhythmia   . COPD (chronic obstructive pulmonary disease)   . Coronary artery disease   . Depression   . Diabetes mellitus without complication   . Diastolic heart failure   . Diverticulitis   . GERD (gastroesophageal reflux disease)   . Heart disease   . Hyperlipidemia   . Hypertension   . Hypothyroid    pt unsure, not currently on medication  . MI (myocardial infarction)    1995  . Radiculopathy     Past Surgical History:  Procedure Laterality Date  . ABDOMINAL HYSTERECTOMY    . APPENDECTOMY    . CARDIAC CATHETERIZATION    . CORONARY ANGIOPLASTY WITH STENT PLACEMENT  12/11/1993   RCA  . FOOT SURGERY Bilateral    Over 25 years ago- Bunionectomy  . INSERT / REPLACE / REMOVE PACEMAKER  09/2012   Model # [id number]  serial # [id number]  . JOINT REPLACEMENT     bilateral knee replacements    Current Outpatient Prescriptions  Medication Sig Dispense Refill  . ALPRAZolam (XANAX) 0.5 MG tablet Take 1 tablet (0.5 mg total) by mouth 2 (two) times daily as needed for anxiety.  30 tablet 0  . amiodarone (PACERONE) 200 MG tablet Take 1 tablet (200 mg total) by mouth daily. 90 tablet 3  . amLODipine (NORVASC) 2.5 MG tablet Take 1 tablet (2.5 mg total) by mouth daily. 90 tablet 3  . aspirin EC 81 MG tablet Take 81 mg by mouth daily.    . Blood Glucose Monitoring Suppl (ACCU-CHEK NANO SMARTVIEW) W/DEVICE KIT Please issue nano meter by AccuChek along with test strips at checking frequency of 2x daily. 90 day supply. E11.9 1 kit 1  . ferrous sulfate 325 (65 FE) MG tablet Take 325 mg by mouth 3 (three) times daily with meals.    . furosemide (LASIX) 40 MG tablet Take 1 tablet (40 mg total) by mouth 2 (two) times daily. 180 tablet 3  . gabapentin (NEURONTIN) 300 MG capsule Take 300 mg by mouth 3 (three) times daily.    Marland Kitchen glucose blood (FREESTYLE LITE) test strip Use as instructed 200 each 3  . HYDROcodone-acetaminophen (NORCO) 10-325 MG per tablet Take 1 tablet by mouth every 4 (four) hours as needed. 90 tablet 0  . Lancets (FREESTYLE) lancets Use as instructed 200 each 3  . pantoprazole (PROTONIX) 40 MG tablet Take 40 mg by mouth daily.    . rosuvastatin (CRESTOR) 40 MG tablet Take 1 tablet (40 mg total) by mouth daily. 90 tablet 3  . traZODone (DESYREL) 100 MG tablet Take 100  mg by mouth at bedtime.     No current facility-administered medications for this visit.     Allergies  Allergen Reactions  . Sulfa Antibiotics     Pt unsure reaction, was told in hospital she had allergy  . Penicillins Nausea And Vomiting and Rash      Review of Systems negative except from HPI and PMH  Physical Exam There were no vitals taken for this visit. Well developed and well nourished in no acute distress HENT normal E scleral and icterus clear Neck Supple JVP flat; carotids brisk and full Clear to ausculation {CARD RHYTHM:10874} ***Regular rate and rhythm, no murmurs gallops or rub Soft with active bowel sounds No clubbing cyanosis {Numbers; edema:17696} Edema Alert and  oriented, grossly normal motor and sensory function Skin Warm and Dry    Assessment and  Plan  Sinus node dysfunction  Pacemaker-[place]  Atrial fibrillation  Ischemic heart disease with prior stenting  HFpEF     Current medicines are reviewed at length with the patient today .  The patient does not*** have concerns regarding medicines.

## 2015-10-10 ENCOUNTER — Telehealth: Payer: Self-pay

## 2015-10-10 NOTE — Telephone Encounter (Signed)
l mom to schedule missed appt with Dr. Caryl Comes

## 2015-10-10 NOTE — Telephone Encounter (Signed)
-----   Message from Emily Filbert, RN sent at 10/10/2015  8:14 AM EDT ----- Patient no showed on 8/22 with SK. Do you mind calling to try to get her back in (not super urgent). She had some a-fib on her device interrogation and needs to discuss anticoagulation. She/ her family were aware of this per the device clinic.  Thanks!

## 2015-11-07 ENCOUNTER — Encounter: Payer: TRICARE For Life (TFL) | Admitting: Internal Medicine

## 2016-01-02 ENCOUNTER — Encounter: Payer: Self-pay | Admitting: Internal Medicine

## 2016-01-02 ENCOUNTER — Ambulatory Visit (INDEPENDENT_AMBULATORY_CARE_PROVIDER_SITE_OTHER): Payer: PRIVATE HEALTH INSURANCE | Admitting: Internal Medicine

## 2016-01-02 VITALS — BP 164/70 | HR 71 | Ht 59.0 in | Wt 184.2 lb

## 2016-01-02 DIAGNOSIS — I495 Sick sinus syndrome: Secondary | ICD-10-CM | POA: Diagnosis not present

## 2016-01-02 DIAGNOSIS — I509 Heart failure, unspecified: Secondary | ICD-10-CM

## 2016-01-02 DIAGNOSIS — I4891 Unspecified atrial fibrillation: Secondary | ICD-10-CM

## 2016-01-02 DIAGNOSIS — Z79899 Other long term (current) drug therapy: Secondary | ICD-10-CM

## 2016-01-02 DIAGNOSIS — I2510 Atherosclerotic heart disease of native coronary artery without angina pectoris: Secondary | ICD-10-CM | POA: Diagnosis not present

## 2016-01-02 DIAGNOSIS — Z950 Presence of cardiac pacemaker: Secondary | ICD-10-CM

## 2016-01-02 MED ORDER — AMLODIPINE BESYLATE 5 MG PO TABS: 5.0000 mg | ORAL_TABLET | Freq: Every day | ORAL | Status: DC

## 2016-01-02 NOTE — Patient Instructions (Addendum)
Medication Instructions: - Your physician has recommended you make the following change in your medication: 1) Decrease amiodarone to 200 mg- take one tablet by mouth once daily 2) Start amlodipine 5 mg- take one tablet by mouth once daily  Labwork: -Your physician recommends that you return for lab work next week: CMET/ TSH (please go the [facility]- 1st desk on the right to check in for labs)  Procedures/Testing: - none ordered  Follow-Up: - Remote monitoring is used to monitor your Pacemaker of ICD from home. This monitoring reduces the number of office visits required to check your device to one time per year. It allows [place] to keep an eye on the functioning of your device to ensure it is working properly. You are scheduled for a device check from home on 04/02/17. You may send your transmission at any time that day. If you have a wireless device, the transmission will be sent automatically. After your physician reviews your transmission, you will receive a postcard with your next transmission date.  - Your physician wants you to follow-up in: 1 year with Dr. Caryl Comes. You will receive a reminder letter in the mail two months in advance. If you don't receive a letter, please call our office to schedule the follow-up appointment.  Any Additional Special Instructions Will Be Listed Below (If Applicable).     If you need a refill on your cardiac medications before your next appointment, please call your pharmacy.

## 2016-01-02 NOTE — Progress Notes (Signed)
Patient Care Team: Rubbie Battiest, NP as PCP - General (Nurse Practitioner)   HPI  Evelyn Simmons is a 82 y.o. 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 She treated with iron replacement therapy.  Denies interval problems with chest pain or shortness of breath. She's had no edema. Her biggest issue is back pain  She has a history of coronary artery disease with a history of stenting in 1995. She last underwent catheterization 12/11 at which point she had high-grade stenosis of her RCA stented. Echocardiograms had demonstrated normal LV function variably and questions of aortic stenosis. Most recent echo 2013 was read as no aortic stenosis and moderate MR.   She notes her whole blood pressure 150 range; she is not aware of her home medicines   3/16 Cr 1.13    Past Medical History:  Diagnosis Date  . Arthritis   . Asthma   . Cardiac arrhythmia   . COPD (chronic obstructive pulmonary disease) ([place])   . Coronary artery disease   . Depression   . Diabetes mellitus without complication ([place])   . Diastolic heart failure ([place])   . Diverticulitis   . GERD (gastroesophageal reflux disease)   . Heart disease   . Hyperlipidemia   . Hypertension   . Hypothyroid    pt unsure, not currently on medication  . MI (myocardial infarction)    1995  . Radiculopathy     Past Surgical History:  Procedure Laterality Date  . ABDOMINAL HYSTERECTOMY    . APPENDECTOMY    . CARDIAC CATHETERIZATION    . CORONARY ANGIOPLASTY WITH STENT PLACEMENT  12/11/1993   RCA  . FOOT SURGERY Bilateral    Over 25 years ago- Bunionectomy  . INSERT / REPLACE / REMOVE PACEMAKER  09/2012   Model # [id number]  serial # [id number]  . JOINT REPLACEMENT     bilateral knee replacements    Current Outpatient Prescriptions    Medication Sig Dispense Refill  . ALPRAZolam (XANAX) 0.5 MG tablet Take 1 tablet (0.5 mg total) by mouth 2 (two) times daily as needed for anxiety. 30 tablet 0  . amiodarone (PACERONE) 200 MG tablet Take 1 tablet (200 mg total) by mouth daily. 90 tablet 3  . amLODipine (NORVASC) 2.5 MG tablet Take 1 tablet (2.5 mg total) by mouth daily. 90 tablet 3  . aspirin EC 81 MG tablet Take 81 mg by mouth daily.    . Blood Glucose Monitoring Suppl (ACCU-CHEK NANO SMARTVIEW) W/DEVICE KIT Please issue nano meter by AccuChek along with test strips at checking frequency of 2x daily. 90 day supply. E11.9 1 kit 1  . ferrous sulfate 325 (65 FE) MG tablet Take 325 mg by mouth 3 (three) times daily with meals.    . furosemide (LASIX) 40 MG tablet Take 1 tablet (40 mg total) by mouth 2 (two) times daily. 180 tablet 3  . gabapentin (NEURONTIN) 300 MG capsule Take 300 mg by mouth 3 (three) times daily.    Marland Kitchen glucose blood (FREESTYLE LITE) test strip Use as instructed 200 each 3  . HYDROcodone-acetaminophen (NORCO) 10-325 MG per tablet Take 1 tablet by mouth every 4 (four) hours as needed. 90 tablet 0  . Lancets (FREESTYLE) lancets Use as instructed 200 each 3  . pantoprazole (PROTONIX) 40 MG tablet  Take 40 mg by mouth daily.    . rosuvastatin (CRESTOR) 40 MG tablet Take 1 tablet (40 mg total) by mouth daily. 90 tablet 3  . traZODone (DESYREL) 100 MG tablet Take 100 mg by mouth at bedtime.     No current facility-administered medications for this visit.     Allergies  Allergen Reactions  . Sulfa Antibiotics     Pt unsure reaction, was told in hospital she had allergy  . Penicillins Nausea And Vomiting and Rash      Review of Systems negative except from HPI and PMH  Physical Exam BP (!) 164/70 (BP Location: Right Arm, Patient Position: Sitting, Cuff Size: Normal)   Pulse 71   Ht '4\' 11"'  (1.499 m)   Wt 184 lb 4 oz (83.6 kg)   BMI 37.21 kg/m  Well developed and well nourished in no acute distress HENT  normal E scleral and icterus clear Neck Supple JVP flat; carotids brisk and full Clear to ausculation Device pocket well healed; without hematoma or erythema.  There is no tethering Regular rate and rhythm, no murmurs gallops or rub Soft with active bowel sounds No clubbing cyanosis  Edema Alert and oriented, grossly normal motor and sensory function Skin Warm and Dry    Assessment and  Plan  Sinus node dysfunction  Pacemaker-[place]  Atrial fibrillation  Hypertension   Ischemic heart disease with prior stenting  HFpEF  Euvolemic continue current meds  Without symptoms of ischemia-- will continue ASA  Will need to clarify if she is on amiodarone--she is not sure  Afib will be left anticoagulated 2/2 hx of transfusion requiring bleeding  BP poorly controlled  Will have to get med list, and wil plan to double amlodipine     Current medicines are reviewed at length with the patient today .  The patient does not  have concerns regarding medicines but also is largely unaware.

## 2016-01-03 ENCOUNTER — Telehealth: Payer: Self-pay | Admitting: *Deleted

## 2016-01-03 NOTE — Telephone Encounter (Signed)
I called and spoke with Dr. Benjiman Core at [facility]) in [facility]- [phone]. I was told by staff there that I would need to relay information on the patient regarding medication changes directly to Dr. Lutricia Feil.  I advised Dr. Lutricia Feil that Dr. Caryl Comes had seen the patient in the office on 11/21 and she was very unclear about her medications. She called when she got home and advised she was taking amiodarone 200 mg BID, and we need to have her take amiodarone 200 mg once daily- per Dr. Lutricia Feil, that is the dose the patient was supposed to be taking.  She is unsure if the patient was looking at her bottle from her initial RX as their patient's receive medications in a bubble pack. I also advised her the patient's blood pressure was 164/70 in the office and that Dr. Caryl Comes was going to start amlodipine 5 mg once daily. I inquired that I thought the patient may have been on this before as it was on her medication list for [place], but she did not report taking this when she called from home. Per Dr. Lutricia Feil, the patient had previously been on this and SBP's were down in the 100-120 range- she states she will order amlodipine 2.5 mg once daily for the patient.   I also advised she will need a repeat CMET/ TSH due to her amiodarone. Per Dr. Lutricia Feil, she did check these on the patient back in the summer- 08/2015= BUN/ Creatinine- 30/1.98 & AST/ ALT- 21/23 10/11/15= TSH- 1.92  Will forward to Dr. Caryl Comes to review.

## 2016-01-11 ENCOUNTER — Encounter: Payer: Self-pay | Admitting: Internal Medicine

## 2016-01-11 NOTE — Telephone Encounter (Signed)
Dr Caryl Comes aware of my conversation with the MD at [facility].

## 2016-02-29 LAB — CUP PACEART INCLINIC DEVICE CHECK
Date Time Interrogation Session: 20171121050000
Implantable Lead Location: 753859
Implantable Lead Model: 4469
Implantable Lead Serial Number: 755425
Implantable Pulse Generator Implant Date: 20140813
Lead Channel Pacing Threshold Amplitude: 0.6 V
Lead Channel Pacing Threshold Pulse Width: 0.4 ms
Lead Channel Sensing Intrinsic Amplitude: 12.1 mV
Lead Channel Sensing Intrinsic Amplitude: 7.6 mV
Lead Channel Setting Pacing Amplitude: 2 V
Lead Channel Setting Pacing Pulse Width: 0.4 ms
MDC IDC LEAD IMPLANT DT: 20140813
MDC IDC LEAD IMPLANT DT: 20140813
MDC IDC LEAD LOCATION: 753860
MDC IDC LEAD SERIAL: 581162
MDC IDC MSMT LEADCHNL RA IMPEDANCE VALUE: 503 Ohm
MDC IDC MSMT LEADCHNL RA PACING THRESHOLD AMPLITUDE: 0.4 V
MDC IDC MSMT LEADCHNL RA PACING THRESHOLD PULSEWIDTH: 0.5 ms
MDC IDC MSMT LEADCHNL RV IMPEDANCE VALUE: 408 Ohm
MDC IDC PG SERIAL: 161924
MDC IDC SET LEADCHNL RV PACING AMPLITUDE: 1.3 V
MDC IDC SET LEADCHNL RV SENSING SENSITIVITY: 2.5 mV
MDC IDC STAT BRADY RA PERCENT PACED: 81 %
MDC IDC STAT BRADY RV PERCENT PACED: 1 % — AB

## 2016-04-02 ENCOUNTER — Ambulatory Visit (INDEPENDENT_AMBULATORY_CARE_PROVIDER_SITE_OTHER): Payer: Medicare (Managed Care) | Admitting: *Deleted

## 2016-04-02 DIAGNOSIS — I495 Sick sinus syndrome: Secondary | ICD-10-CM

## 2016-04-02 NOTE — Progress Notes (Signed)
Remote pacemaker transmission.   

## 2016-04-03 ENCOUNTER — Encounter: Payer: Self-pay | Admitting: Cardiology

## 2016-04-03 LAB — CUP PACEART REMOTE DEVICE CHECK
Battery Remaining Longevity: 60 mo
Brady Statistic RA Percent Paced: 80 %
Brady Statistic RV Percent Paced: 0 %
Implantable Lead Implant Date: 20140813
Implantable Lead Location: 753860
Implantable Lead Model: 4469
Implantable Lead Model: 4470
Implantable Lead Serial Number: 581162
Lead Channel Impedance Value: 382 Ohm
Lead Channel Impedance Value: 456 Ohm
Lead Channel Pacing Threshold Amplitude: 0.9 V
Lead Channel Pacing Threshold Pulse Width: 0.4 ms
Lead Channel Setting Pacing Amplitude: 1.4 V
Lead Channel Setting Pacing Amplitude: 2 V
Lead Channel Setting Sensing Sensitivity: 2.5 mV
MDC IDC LEAD IMPLANT DT: 20140813
MDC IDC LEAD LOCATION: 753859
MDC IDC LEAD SERIAL: 755425
MDC IDC MSMT BATTERY REMAINING PERCENTAGE: 94 %
MDC IDC MSMT LEADCHNL RA PACING THRESHOLD AMPLITUDE: 0.4 V
MDC IDC MSMT LEADCHNL RA PACING THRESHOLD PULSEWIDTH: 0.5 ms
MDC IDC PG IMPLANT DT: 20140813
MDC IDC PG SERIAL: 161924
MDC IDC SESS DTM: 20180220050100
MDC IDC SET LEADCHNL RV PACING PULSEWIDTH: 0.4 ms

## 2016-07-02 ENCOUNTER — Ambulatory Visit (INDEPENDENT_AMBULATORY_CARE_PROVIDER_SITE_OTHER): Payer: Medicare (Managed Care) | Admitting: *Deleted

## 2016-07-02 DIAGNOSIS — I495 Sick sinus syndrome: Secondary | ICD-10-CM

## 2016-07-02 NOTE — Progress Notes (Signed)
Remote pacemaker transmission.   

## 2016-07-03 ENCOUNTER — Encounter: Payer: Self-pay | Admitting: Cardiology

## 2016-07-09 LAB — CUP PACEART REMOTE DEVICE CHECK
Battery Remaining Longevity: 60 mo
Brady Statistic RV Percent Paced: 0 %
Implantable Lead Implant Date: 20140813
Implantable Lead Location: 753860
Implantable Lead Model: 4469
Implantable Lead Model: 4470
Implantable Pulse Generator Implant Date: 20140813
Lead Channel Impedance Value: 454 Ohm
Lead Channel Pacing Threshold Amplitude: 0.9 V
Lead Channel Setting Pacing Amplitude: 2 V
MDC IDC LEAD IMPLANT DT: 20140813
MDC IDC LEAD LOCATION: 753859
MDC IDC LEAD SERIAL: 581162
MDC IDC LEAD SERIAL: 755425
MDC IDC MSMT BATTERY REMAINING PERCENTAGE: 90 %
MDC IDC MSMT LEADCHNL RA PACING THRESHOLD AMPLITUDE: 0.4 V
MDC IDC MSMT LEADCHNL RA PACING THRESHOLD PULSEWIDTH: 0.5 ms
MDC IDC MSMT LEADCHNL RV IMPEDANCE VALUE: 383 Ohm
MDC IDC MSMT LEADCHNL RV PACING THRESHOLD PULSEWIDTH: 0.4 ms
MDC IDC PG SERIAL: 161924
MDC IDC SESS DTM: 20180522041300
MDC IDC SET LEADCHNL RV PACING AMPLITUDE: 1.3 V
MDC IDC SET LEADCHNL RV PACING PULSEWIDTH: 0.4 ms
MDC IDC SET LEADCHNL RV SENSING SENSITIVITY: 2.5 mV
MDC IDC STAT BRADY RA PERCENT PACED: 82 %

## 2016-08-27 ENCOUNTER — Encounter (INDEPENDENT_AMBULATORY_CARE_PROVIDER_SITE_OTHER): Payer: Self-pay | Admitting: Ophthalmology

## 2016-08-28 ENCOUNTER — Encounter (INDEPENDENT_AMBULATORY_CARE_PROVIDER_SITE_OTHER): Payer: Self-pay | Admitting: Ophthalmology

## 2016-10-01 ENCOUNTER — Ambulatory Visit (INDEPENDENT_AMBULATORY_CARE_PROVIDER_SITE_OTHER): Payer: No Typology Code available for payment source | Admitting: *Deleted

## 2016-10-01 DIAGNOSIS — I495 Sick sinus syndrome: Secondary | ICD-10-CM

## 2016-10-02 NOTE — Progress Notes (Signed)
Remote pacemaker transmission.   

## 2016-10-11 ENCOUNTER — Encounter: Payer: Self-pay | Admitting: Cardiology

## 2016-11-19 LAB — CUP PACEART REMOTE DEVICE CHECK
Battery Remaining Percentage: 85 %
Brady Statistic RA Percent Paced: 85 %
Implantable Lead Implant Date: 20140813
Implantable Lead Location: 753859
Implantable Lead Location: 753860
Implantable Lead Model: 4469
Implantable Lead Serial Number: 581162
Lead Channel Pacing Threshold Amplitude: 0.4 V
Lead Channel Pacing Threshold Pulse Width: 0.4 ms
Lead Channel Setting Pacing Amplitude: 1.4 V
Lead Channel Setting Pacing Amplitude: 2 V
Lead Channel Setting Pacing Pulse Width: 0.4 ms
Lead Channel Setting Sensing Sensitivity: 2.5 mV
MDC IDC LEAD IMPLANT DT: 20140813
MDC IDC LEAD SERIAL: 755425
MDC IDC MSMT BATTERY REMAINING LONGEVITY: 54 mo
MDC IDC MSMT LEADCHNL RA IMPEDANCE VALUE: 448 Ohm
MDC IDC MSMT LEADCHNL RA PACING THRESHOLD PULSEWIDTH: 0.5 ms
MDC IDC MSMT LEADCHNL RV IMPEDANCE VALUE: 364 Ohm
MDC IDC MSMT LEADCHNL RV PACING THRESHOLD AMPLITUDE: 0.9 V
MDC IDC PG IMPLANT DT: 20140813
MDC IDC PG SERIAL: 161924
MDC IDC SESS DTM: 20180821040100
MDC IDC STAT BRADY RV PERCENT PACED: 0 %

## 2016-12-31 ENCOUNTER — Ambulatory Visit (INDEPENDENT_AMBULATORY_CARE_PROVIDER_SITE_OTHER): Payer: No Typology Code available for payment source | Admitting: *Deleted

## 2016-12-31 DIAGNOSIS — I495 Sick sinus syndrome: Secondary | ICD-10-CM

## 2017-01-01 NOTE — Progress Notes (Signed)
Remote pacemaker transmission.   

## 2017-01-02 LAB — CUP PACEART REMOTE DEVICE CHECK
Brady Statistic RA Percent Paced: 85 %
Date Time Interrogation Session: 20181120051500
Implantable Lead Implant Date: 20140813
Implantable Lead Location: 753860
Implantable Lead Model: 4469
Implantable Lead Model: 4470
Implantable Lead Serial Number: 755425
Implantable Pulse Generator Implant Date: 20140813
Lead Channel Impedance Value: 377 Ohm
Lead Channel Pacing Threshold Amplitude: 0.9 V
Lead Channel Pacing Threshold Pulse Width: 0.4 ms
Lead Channel Setting Pacing Amplitude: 1.5 V
MDC IDC LEAD IMPLANT DT: 20140813
MDC IDC LEAD LOCATION: 753859
MDC IDC LEAD SERIAL: 581162
MDC IDC MSMT BATTERY REMAINING LONGEVITY: 54 mo
MDC IDC MSMT BATTERY REMAINING PERCENTAGE: 82 %
MDC IDC MSMT LEADCHNL RA IMPEDANCE VALUE: 445 Ohm
MDC IDC MSMT LEADCHNL RA PACING THRESHOLD AMPLITUDE: 0.4 V
MDC IDC MSMT LEADCHNL RA PACING THRESHOLD PULSEWIDTH: 0.5 ms
MDC IDC PG SERIAL: 161924
MDC IDC SET LEADCHNL RA PACING AMPLITUDE: 2 V
MDC IDC SET LEADCHNL RV PACING PULSEWIDTH: 0.4 ms
MDC IDC SET LEADCHNL RV SENSING SENSITIVITY: 2.5 mV
MDC IDC STAT BRADY RV PERCENT PACED: 0 %

## 2017-01-09 ENCOUNTER — Encounter: Payer: Self-pay | Admitting: Cardiology

## 2017-04-01 ENCOUNTER — Ambulatory Visit (INDEPENDENT_AMBULATORY_CARE_PROVIDER_SITE_OTHER): Payer: No Typology Code available for payment source | Admitting: *Deleted

## 2017-04-01 DIAGNOSIS — I495 Sick sinus syndrome: Secondary | ICD-10-CM | POA: Diagnosis not present

## 2017-04-01 NOTE — Progress Notes (Signed)
Remote pacemaker transmission.   

## 2017-04-02 LAB — CUP PACEART REMOTE DEVICE CHECK
Battery Remaining Longevity: 48 mo
Battery Remaining Percentage: 77 %
Brady Statistic RA Percent Paced: 84 %
Brady Statistic RV Percent Paced: 0 %
Date Time Interrogation Session: 20190219050100
Implantable Lead Implant Date: 20140813
Implantable Lead Location: 753859
Implantable Lead Location: 753860
Implantable Lead Model: 4470
Implantable Lead Serial Number: 755425
Lead Channel Impedance Value: 419 Ohm
Lead Channel Pacing Threshold Amplitude: 0.4 V
Lead Channel Pacing Threshold Pulse Width: 0.4 ms
Lead Channel Pacing Threshold Pulse Width: 0.5 ms
Lead Channel Setting Pacing Amplitude: 1.4 V
Lead Channel Setting Pacing Amplitude: 2 V
Lead Channel Setting Pacing Pulse Width: 0.4 ms
MDC IDC LEAD IMPLANT DT: 20140813
MDC IDC LEAD SERIAL: 581162
MDC IDC MSMT LEADCHNL RV IMPEDANCE VALUE: 352 Ohm
MDC IDC MSMT LEADCHNL RV PACING THRESHOLD AMPLITUDE: 0.9 V
MDC IDC PG IMPLANT DT: 20140813
MDC IDC SET LEADCHNL RV SENSING SENSITIVITY: 2.5 mV
Pulse Gen Serial Number: 161924

## 2017-04-03 ENCOUNTER — Encounter: Payer: Self-pay | Admitting: Cardiology

## 2017-07-01 ENCOUNTER — Ambulatory Visit (INDEPENDENT_AMBULATORY_CARE_PROVIDER_SITE_OTHER): Payer: No Typology Code available for payment source | Admitting: *Deleted

## 2017-07-01 DIAGNOSIS — I495 Sick sinus syndrome: Secondary | ICD-10-CM

## 2017-07-01 DIAGNOSIS — R001 Bradycardia, unspecified: Secondary | ICD-10-CM

## 2017-07-01 NOTE — Progress Notes (Signed)
Remote pacemaker transmission.   

## 2017-07-08 ENCOUNTER — Encounter: Payer: No Typology Code available for payment source | Admitting: Internal Medicine

## 2017-07-18 LAB — CUP PACEART REMOTE DEVICE CHECK
Battery Remaining Percentage: 73 %
Brady Statistic RA Percent Paced: 82 %
Date Time Interrogation Session: 20190521040200
Implantable Lead Implant Date: 20140813
Implantable Lead Location: 753859
Implantable Lead Serial Number: 581162
Implantable Pulse Generator Implant Date: 20140813
Lead Channel Impedance Value: 363 Ohm
Lead Channel Pacing Threshold Amplitude: 1 V
Lead Channel Pacing Threshold Pulse Width: 0.4 ms
Lead Channel Pacing Threshold Pulse Width: 0.5 ms
Lead Channel Setting Pacing Pulse Width: 0.4 ms
Lead Channel Setting Sensing Sensitivity: 2.5 mV
MDC IDC LEAD IMPLANT DT: 20140813
MDC IDC LEAD LOCATION: 753860
MDC IDC LEAD SERIAL: 755425
MDC IDC MSMT BATTERY REMAINING LONGEVITY: 48 mo
MDC IDC MSMT LEADCHNL RA IMPEDANCE VALUE: 464 Ohm
MDC IDC MSMT LEADCHNL RA PACING THRESHOLD AMPLITUDE: 0.4 V
MDC IDC SET LEADCHNL RA PACING AMPLITUDE: 2 V
MDC IDC SET LEADCHNL RV PACING AMPLITUDE: 1.4 V
MDC IDC STAT BRADY RV PERCENT PACED: 0 %
Pulse Gen Serial Number: 161924

## 2017-07-24 ENCOUNTER — Other Ambulatory Visit: Payer: Self-pay | Admitting: Ophthalmology

## 2017-07-24 DIAGNOSIS — H3412 Central retinal artery occlusion, left eye: Secondary | ICD-10-CM

## 2017-08-01 ENCOUNTER — Ambulatory Visit: Payer: No Typology Code available for payment source

## 2017-08-12 ENCOUNTER — Encounter: Payer: Self-pay | Admitting: Internal Medicine

## 2017-08-12 ENCOUNTER — Encounter

## 2017-08-12 ENCOUNTER — Ambulatory Visit (INDEPENDENT_AMBULATORY_CARE_PROVIDER_SITE_OTHER): Payer: No Typology Code available for payment source | Admitting: Internal Medicine

## 2017-08-12 VITALS — BP 120/58 | HR 76 | Ht 59.0 in | Wt 176.0 lb

## 2017-08-12 DIAGNOSIS — I4891 Unspecified atrial fibrillation: Secondary | ICD-10-CM | POA: Diagnosis not present

## 2017-08-12 DIAGNOSIS — Z79899 Other long term (current) drug therapy: Secondary | ICD-10-CM

## 2017-08-12 DIAGNOSIS — H499 Unspecified paralytic strabismus: Secondary | ICD-10-CM

## 2017-08-12 DIAGNOSIS — I495 Sick sinus syndrome: Secondary | ICD-10-CM | POA: Diagnosis not present

## 2017-08-12 DIAGNOSIS — H5462 Unqualified visual loss, left eye, normal vision right eye: Secondary | ICD-10-CM | POA: Diagnosis not present

## 2017-08-12 DIAGNOSIS — Z950 Presence of cardiac pacemaker: Secondary | ICD-10-CM

## 2017-08-12 MED ORDER — AMIODARONE HCL 100 MG PO TABS: 100.0000 mg | ORAL_TABLET | Freq: Every day | ORAL | 6 refills | Status: DC

## 2017-08-12 NOTE — Progress Notes (Addendum)
Patient Care Team: Leticia Penna, MD as PCP - General (Family Medicine)   HPI  Evelyn Simmons is a 84 y.o. 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 She treated with iron replacement therapy. Takes amiodarone  History of coronary artery disease with stenting in 1995. Last underwent catheterization 12/11-- high-grade stenosis of her RCA >>stented.    Patient denies symptoms of GI intolerance, sun sensitivity, neurological symptoms attributable to amiodarone.    Date Cr K TSH LFTs Hgb PFTs  3/16  1.13 4.5 2.75 12     2/17       9.3    7/19 1.1 4.0 1.45 19 10.9    DATE TEST EF   12/11 LHC  RCA stented   12/13 TEE 45%   8/17 Echo   >55 % AS mild (mean Grad 13)         No real issues with shortness of breath or chest pain.  She ambulates with a walker.  About 2 weeks ago she had the abrupt loss of vision in her left eye.  It was painless.      Past Medical History:  Diagnosis Date  . Arthritis   . Asthma   . Cardiac arrhythmia   . COPD (chronic obstructive pulmonary disease) ([place])   . Coronary artery disease   . Depression   . Diabetes mellitus without complication ([place])   . Diastolic heart failure ([place])   . Diverticulitis   . GERD (gastroesophageal reflux disease)   . Heart disease   . Hyperlipidemia   . Hypertension   . Hypothyroid    pt unsure, not currently on medication  . MI (myocardial infarction) ([place])    1995  . Radiculopathy     Past Surgical History:  Procedure Laterality Date  . ABDOMINAL HYSTERECTOMY    . APPENDECTOMY    . CARDIAC CATHETERIZATION    . CORONARY ANGIOPLASTY WITH STENT PLACEMENT  12/11/1993   RCA  . FOOT SURGERY Bilateral    Over 25 years ago- Bunionectomy  . INSERT / REPLACE / REMOVE PACEMAKER  09/2012   Model # O270   serial # [id number]  . JOINT REPLACEMENT     bilateral knee replacements    Current Outpatient Medications  Medication Sig Dispense Refill  . albuterol (PROVENTIL HFA;VENTOLIN HFA) 108 (90 Base) MCG/ACT inhaler Inhale 2 puffs into the lungs every 6 (six) hours as needed for wheezing or shortness of breath.    Marland Kitchen amiodarone (PACERONE) 200 MG tablet Take one tablet (200 mg) by mouth once daily    . amLODipine (NORVASC) 2.5 MG tablet Take 2.5 mg by mouth daily.    Marland Kitchen aspirin EC 81 MG tablet Take 81 mg by mouth daily.    . Blood Glucose Monitoring Suppl (ACCU-CHEK NANO SMARTVIEW) W/DEVICE KIT Please issue nano meter by AccuChek along with test strips at checking frequency of 2x daily. 90 day supply. E11.9 1 kit 1  . cholecalciferol (VITAMIN D) 1000 units tablet Take 1,000 Units by mouth daily.    . cyclobenzaprine (FLEXERIL) 5 MG tablet Take 5 mg by mouth 3 (three) times daily as needed for muscle spasms.    . DULoxetine (CYMBALTA) 30 MG capsule Take 30 mg by mouth daily.    . furosemide (LASIX) 40 MG tablet Take 1 tablet (40  mg total) by mouth 2 (two) times daily. 180 tablet 3  . glucose blood (FREESTYLE LITE) test strip Use as instructed 200 each 3  . HYDROcodone-acetaminophen (NORCO) 10-325 MG per tablet Take 1 tablet by mouth every 4 (four) hours as needed. 90 tablet 0  . ipratropium-albuterol (DUONEB) 0.5-2.5 (3) MG/3ML SOLN Take 3 mLs by nebulization.    . Iron-Vitamin C (VITRON-C PO) Take by mouth. Take one tablet by mouth twice daily on Monday/ Wednesday/ Friday    . Lancets (FREESTYLE) lancets Use as instructed 200 each 3  . liraglutide (VICTOZA) 18 MG/3ML SOPN Inject into the skin.    . pantoprazole (PROTONIX) 20 MG tablet Take 20 mg by mouth daily.    . rosuvastatin (CRESTOR) 40 MG tablet Take 1 tablet (40 mg total) by mouth daily. 90 tablet 3  . Sennosides-Docusate Sodium (SENEXON-S PO) Take by mouth. Take two tablets by mouth once daily    . traZODone (DESYREL) 100 MG tablet Take 100 mg  by mouth at bedtime.     No current facility-administered medications for this visit.     Allergies  Allergen Reactions  . Other Other (See Comments)  . Sulfa Antibiotics     Pt unsure reaction, was told in hospital she had allergy  . Penicillin G Rash  . Penicillins Nausea And Vomiting and Rash      Review of Systems negative except from HPI and PMH  Physical Exam BP (!) 120/58 (BP Location: Left Arm, Patient Position: Sitting, Cuff Size: Normal)   Pulse 76   Ht 4' 11" (1.499 m)   Wt 176 lb (79.8 kg)   BMI 35.55 kg/m  Well developed and nourished in no acute distress HENT normal Neck supple with JVP-flat Clear Perhaps a low pitched bruit in her left neck Regular rate and rhythm, no murmurs or gallops Abd-soft with active BS No Clubbing cyanosis trace edema Skin-warm and dry A & Oriented  Grossly normal sensory and motor function Minimal vision to her left eye    ECG Atrial paced at 76 Interval 23/11/39  Assessment and  Plan  Sinus node dysfunction  Pacemaker-[place]  Atrial fibrillation  Hypertension   Ischemic heart disease with prior stenting  HFpEF  Anemia  Loss of vision left eye   Without symptoms of ischemia  Her visual loss unassociated with a field cut suggests retinal artery occlusion.  This is supported by the lack of pain which might of suggested retinal venous occlusion.  She has had no atrial fibrillation detected on her device and statistically retinal artery occlusion is much more frequently associate with carotid artery disease and atrial fibrillation.  Still.  I have spoken about her with Dr. Erlinda Hong from neurology.  Will obtain carotid Dopplers CTA of the vessels of her head and neck and she is to see her ophthalmologist in the next couple of weeks.  For now we will continue her on aspirin although antiplatelet therapy with clopidogrel might be preferred  As there is been no interval atrial fibrillation of note, we will decrease the  amiodarone from 200--100 mg daily.  Will need to check her amiodarone surveillance labs.  Last hemoglobin in the chart was a couple years ago was 9.3.  We will recheck this.  More than 50% of 40 min was spent in counseling related to the above  Current medicines are reviewed at length with the patient today .  The patient does not  have concerns regarding medicines but also is largely unaware.

## 2017-08-12 NOTE — Patient Instructions (Addendum)
Medication Instructions: - Your physician has recommended you make the following change in your medication:   1) DECREASE amiodarone to 100 mg- take 1 tablet (100 mg) by mouth once daily  Labwork: - Your physician recommends that you have lab work today: CMET/ CBC/ TSH  Procedures/Testing: - Non-Cardiac CT Angiography (CTA) of the head & neck, is a special type of CT scan that uses a computer to produce multi-dimensional views of major blood vessels throughout the body. In CT angiography, a contrast material is injected through an IV to help visualize the blood vessels  Wednesday 08/20/17 at 2:00 pm (arrive at 1:45 pm) -[place] off of [address] on for 4 hours prior to the procedure   - Your physician has requested that you have a carotid duplex. This test is an ultrasound of the carotid arteries in your neck. It looks at blood flow through these arteries that supply the brain with blood. Allow one hour for this exam. There are no restrictions or special instructions.  Follow-Up: - Remote monitoring is used to monitor your Pacemaker of ICD from home. This monitoring reduces the number of office visits required to check your device to one time per year. It allows [place] to keep an eye on the functioning of your device to ensure it is working properly. You are scheduled for a device check from home on 09/30/17. You may send your transmission at any time that day. If you have a wireless device, the transmission will be sent automatically. After your physician reviews your transmission, you will receive a postcard with your next transmission date.  - Your physician wants you to follow-up in: 6 months with Dr. Caryl Comes. You will receive a reminder letter in the mail two months in advance. If you don't receive a letter, please call our office to schedule the follow-up appointment.  Any Additional Special Instructions Will Be Listed Below (If Applicable).     If you  need a refill on your cardiac medications before your next appointment, please call your pharmacy.

## 2017-08-20 ENCOUNTER — Telehealth: Payer: Self-pay | Admitting: Internal Medicine

## 2017-08-20 ENCOUNTER — Ambulatory Visit: Admission: RE | Admit: 2017-08-20 | Payer: Medicare (Managed Care) | Source: Ambulatory Visit

## 2017-08-20 ENCOUNTER — Other Ambulatory Visit: Payer: No Typology Code available for payment source

## 2017-08-20 NOTE — Telephone Encounter (Signed)
Patient calling to let us know ct was cancelled because of insurance per [facility]   Patient has [facility] and was advised to touch base with them as well.

## 2017-08-21 ENCOUNTER — Telehealth: Payer: Self-pay | Admitting: Internal Medicine

## 2017-08-21 NOTE — Telephone Encounter (Signed)
I called the patient with her lab results Dr. Caryl Comes had ordered for her to have done through [facility]- BMP/CBC/ TSH. All labs normal.  The patient proceeded to tell me that her CTA of the head and neck was cancelled by someone yesterday and she was unsure why.  I reviewed her appointments as we did not cancel the test. Per review of her cancelled appts, this was done by someone not in our practice with a comment that no authorization had been obtained.  I advised the patient I would have to call to find out who cancelled this and follow back up with her.  She voices understanding.   I called [facility] outpatient imaging where this was scheduled and they were unaware of anybody who had/ would cancel this.  I have sent a message to our pre-cert dept to please see if the CTA of the head/ neck requires a PA. I will also call the scheduling dept after I find out from pre-cert if this requires an authorization.

## 2017-08-22 NOTE — Telephone Encounter (Signed)
I spoke with the patient. She is aware that we will need to cancel her carotid US for Monday and that Dr. Caryl Comes will have to call and speak with the physician at [facility] to try to get approval for her test.  I advised her I will call her back once Dr. Caryl Comes speaks with [facility].  She is agreeable and voices understanding.

## 2017-08-22 NOTE — Telephone Encounter (Signed)
Pt is returning the call from yesterday

## 2017-08-22 NOTE — Telephone Encounter (Signed)
I left a message for the patient to call.  Reviewed with the pre-cert dept regarding the patient's CT head/ neck. Per Charmaine, the patient's CT head/ neck & carotid US have all been denied by the [facility] physician stating that they are not needed.   Dr. Caryl Comes will need to call [facility] to discuss.  Per Charmaine: Dr. Caryl Comes if you wish to speak w/[facility] MD please call [phone], Dr. Manson Allan (?spelling from VM)

## 2017-08-29 ENCOUNTER — Other Ambulatory Visit: Payer: Self-pay | Admitting: Family Medicine

## 2017-08-29 DIAGNOSIS — H3412 Central retinal artery occlusion, left eye: Secondary | ICD-10-CM

## 2017-09-02 NOTE — Telephone Encounter (Signed)
Late entry- per Dr. Caryl Comes, he called and spoke with the physician at [facility] on 08/26/17 and was told they reached out to the patient and she decided she would most likely not want anything done if something was shown on her ultrasound/ CT. She has decided not to proceed with either test.

## 2017-09-10 ENCOUNTER — Ambulatory Visit
Admission: RE | Admit: 2017-09-10 | Discharge: 2017-09-10 | Disposition: A | Discharge status: Home / Self Care | Payer: Medicare (Managed Care) | Source: Ambulatory Visit | Attending: Family Medicine | Admitting: Family Medicine

## 2017-09-10 DIAGNOSIS — H3412 Central retinal artery occlusion, left eye: Secondary | ICD-10-CM | POA: Diagnosis not present

## 2017-09-10 DIAGNOSIS — I6523 Occlusion and stenosis of bilateral carotid arteries: Secondary | ICD-10-CM | POA: Diagnosis not present

## 2017-09-30 ENCOUNTER — Ambulatory Visit (INDEPENDENT_AMBULATORY_CARE_PROVIDER_SITE_OTHER): Payer: Medicare (Managed Care) | Admitting: *Deleted

## 2017-09-30 DIAGNOSIS — I495 Sick sinus syndrome: Secondary | ICD-10-CM | POA: Diagnosis not present

## 2017-10-01 NOTE — Progress Notes (Signed)
Remote pacemaker transmission.   

## 2017-11-04 LAB — CUP PACEART REMOTE DEVICE CHECK
Implantable Lead Location: 753859
Implantable Lead Model: 4469
Implantable Lead Model: 4470
Implantable Lead Serial Number: 581162
Implantable Lead Serial Number: 755425
MDC IDC LEAD IMPLANT DT: 20140813
MDC IDC LEAD IMPLANT DT: 20140813
MDC IDC LEAD LOCATION: 753860
MDC IDC PG IMPLANT DT: 20140813
MDC IDC SESS DTM: 20190924072922
Pulse Gen Serial Number: 161924

## 2017-12-29 ENCOUNTER — Other Ambulatory Visit: Payer: Self-pay | Admitting: Family Medicine

## 2017-12-29 DIAGNOSIS — R1084 Generalized abdominal pain: Secondary | ICD-10-CM

## 2017-12-30 ENCOUNTER — Ambulatory Visit (INDEPENDENT_AMBULATORY_CARE_PROVIDER_SITE_OTHER): Payer: No Typology Code available for payment source

## 2017-12-30 ENCOUNTER — Ambulatory Visit: Admission: RE | Admit: 2017-12-30 | Payer: No Typology Code available for payment source | Source: Ambulatory Visit

## 2017-12-30 DIAGNOSIS — I495 Sick sinus syndrome: Secondary | ICD-10-CM | POA: Diagnosis not present

## 2018-01-01 NOTE — Progress Notes (Signed)
Remote pacemaker transmission.   

## 2018-02-25 LAB — CUP PACEART REMOTE DEVICE CHECK
Brady Statistic RV Percent Paced: 0 %
Date Time Interrogation Session: 20191119061000
Implantable Lead Implant Date: 20140813
Implantable Lead Location: 753860
Implantable Lead Serial Number: 755425
Implantable Pulse Generator Implant Date: 20140813
Lead Channel Impedance Value: 364 Ohm
Lead Channel Pacing Threshold Amplitude: 0.5 V
Lead Channel Pacing Threshold Amplitude: 1.1 V
Lead Channel Pacing Threshold Pulse Width: 0.5 ms
MDC IDC LEAD IMPLANT DT: 20140813
MDC IDC LEAD LOCATION: 753859
MDC IDC LEAD SERIAL: 581162
MDC IDC MSMT BATTERY REMAINING LONGEVITY: 42 mo
MDC IDC MSMT BATTERY REMAINING PERCENTAGE: 64 %
MDC IDC MSMT LEADCHNL RA IMPEDANCE VALUE: 457 Ohm
MDC IDC MSMT LEADCHNL RV PACING THRESHOLD PULSEWIDTH: 0.4 ms
MDC IDC SET LEADCHNL RA PACING AMPLITUDE: 2 V
MDC IDC SET LEADCHNL RV PACING AMPLITUDE: 1.5 V
MDC IDC SET LEADCHNL RV PACING PULSEWIDTH: 0.4 ms
MDC IDC SET LEADCHNL RV SENSING SENSITIVITY: 2.5 mV
MDC IDC STAT BRADY RA PERCENT PACED: 64 %
Pulse Gen Serial Number: 161924

## 2018-03-31 ENCOUNTER — Ambulatory Visit (INDEPENDENT_AMBULATORY_CARE_PROVIDER_SITE_OTHER): Payer: No Typology Code available for payment source

## 2018-03-31 DIAGNOSIS — I495 Sick sinus syndrome: Secondary | ICD-10-CM

## 2018-04-01 LAB — CUP PACEART REMOTE DEVICE CHECK
Battery Remaining Longevity: 36 mo
Battery Remaining Percentage: 61 %
Brady Statistic RA Percent Paced: 66 %
Date Time Interrogation Session: 20200218050100
Implantable Lead Implant Date: 20140813
Implantable Lead Location: 753859
Implantable Lead Model: 4469
Implantable Lead Model: 4470
Lead Channel Impedance Value: 470 Ohm
Lead Channel Pacing Threshold Pulse Width: 0.4 ms
Lead Channel Setting Pacing Amplitude: 1.4 V
Lead Channel Setting Pacing Amplitude: 2 V
Lead Channel Setting Pacing Pulse Width: 0.4 ms
Lead Channel Setting Sensing Sensitivity: 2.5 mV
MDC IDC LEAD IMPLANT DT: 20140813
MDC IDC LEAD LOCATION: 753860
MDC IDC LEAD SERIAL: 581162
MDC IDC LEAD SERIAL: 755425
MDC IDC MSMT LEADCHNL RA PACING THRESHOLD AMPLITUDE: 0.5 V
MDC IDC MSMT LEADCHNL RA PACING THRESHOLD PULSEWIDTH: 0.5 ms
MDC IDC MSMT LEADCHNL RV IMPEDANCE VALUE: 356 Ohm
MDC IDC MSMT LEADCHNL RV PACING THRESHOLD AMPLITUDE: 0.9 V
MDC IDC PG IMPLANT DT: 20140813
MDC IDC PG SERIAL: 161924
MDC IDC STAT BRADY RV PERCENT PACED: 0 %

## 2018-04-08 NOTE — Progress Notes (Signed)
Remote pacemaker transmission.   

## 2018-04-13 ENCOUNTER — Encounter: Payer: Self-pay | Admitting: Cardiology

## 2018-05-05 ENCOUNTER — Telehealth: Payer: Self-pay | Admitting: Internal Medicine

## 2018-05-05 NOTE — Telephone Encounter (Signed)
   Primary Cardiologist:  Virl Axe   Patient contacted directly by Dr. Caryl Comes.  History reviewed.  No symptoms to suggest any unstable cardiac conditions.  Based on discussion, with current pandemic situation, we will be postponing this appointment for [facility] with a plan for f/u in 8-12 weeks or sooner if feasible/necessary.  If symptoms change, she has been instructed to contact our office.   Routing to C19 CANCEL pool for tracking (P CV DIV CV19 CANCEL - reason for visit "other.") and assigning priority (1 = 4-6 wks, 2 = 6-12 wks, 3 = >12 wks).   Alvis Lemmings, RN  05/05/2018 4:21 PM         .

## 2018-05-05 NOTE — Telephone Encounter (Signed)
Called and spoke to pt regarding appointment 05/12/18 for device followup  Currently symptoms are stable* and we Discussed rescheduling of the appt  8-12 weeks from now  Pt is agreeable and advised to call if interval problems

## 2018-05-12 ENCOUNTER — Encounter: Payer: No Typology Code available for payment source | Admitting: Internal Medicine

## 2018-05-21 NOTE — Telephone Encounter (Signed)
RECALL PLACED FOR 09/12/2018 

## 2018-06-30 ENCOUNTER — Other Ambulatory Visit: Payer: Self-pay

## 2018-06-30 ENCOUNTER — Ambulatory Visit (INDEPENDENT_AMBULATORY_CARE_PROVIDER_SITE_OTHER): Payer: No Typology Code available for payment source | Admitting: *Deleted

## 2018-06-30 DIAGNOSIS — I495 Sick sinus syndrome: Secondary | ICD-10-CM

## 2018-06-30 LAB — CUP PACEART REMOTE DEVICE CHECK
Battery Remaining Longevity: 36 mo
Battery Remaining Percentage: 56 %
Brady Statistic RA Percent Paced: 68 %
Brady Statistic RV Percent Paced: 0 %
Date Time Interrogation Session: 20200519045400
Implantable Lead Implant Date: 20140813
Implantable Lead Implant Date: 20140813
Implantable Lead Location: 753859
Implantable Lead Location: 753860
Implantable Lead Model: 4469
Implantable Lead Model: 4470
Implantable Lead Serial Number: 581162
Implantable Lead Serial Number: 755425
Implantable Pulse Generator Implant Date: 20140813
Lead Channel Impedance Value: 351 Ohm
Lead Channel Impedance Value: 467 Ohm
Lead Channel Pacing Threshold Amplitude: 0.5 V
Lead Channel Pacing Threshold Amplitude: 1 V
Lead Channel Pacing Threshold Pulse Width: 0.4 ms
Lead Channel Pacing Threshold Pulse Width: 0.5 ms
Lead Channel Setting Pacing Amplitude: 1.5 V
Lead Channel Setting Pacing Amplitude: 2 V
Lead Channel Setting Pacing Pulse Width: 0.4 ms
Lead Channel Setting Sensing Sensitivity: 2.5 mV
Pulse Gen Serial Number: 161924

## 2018-07-09 ENCOUNTER — Encounter: Payer: Self-pay | Admitting: Cardiology

## 2018-07-09 NOTE — Progress Notes (Signed)
Remote pacemaker transmission.   

## 2018-09-29 ENCOUNTER — Encounter: Payer: No Typology Code available for payment source | Admitting: *Deleted

## 2018-09-30 ENCOUNTER — Telehealth: Payer: Self-pay

## 2018-09-30 NOTE — Telephone Encounter (Signed)
Left message for patient to remind of missed remote transmission.  

## 2018-11-27 ENCOUNTER — Ambulatory Visit (INDEPENDENT_AMBULATORY_CARE_PROVIDER_SITE_OTHER): Payer: Medicare (Managed Care) | Admitting: *Deleted

## 2018-11-27 DIAGNOSIS — I495 Sick sinus syndrome: Secondary | ICD-10-CM

## 2018-11-27 DIAGNOSIS — R001 Bradycardia, unspecified: Secondary | ICD-10-CM

## 2018-11-30 LAB — CUP PACEART REMOTE DEVICE CHECK
Battery Remaining Longevity: 30 mo
Battery Remaining Percentage: 53 %
Brady Statistic RA Percent Paced: 70 %
Brady Statistic RV Percent Paced: 0 %
Date Time Interrogation Session: 20201016055600
Implantable Lead Implant Date: 20140813
Implantable Lead Implant Date: 20140813
Implantable Lead Location: 753859
Implantable Lead Location: 753860
Implantable Lead Model: 4469
Implantable Lead Model: 4470
Implantable Lead Serial Number: 581162
Implantable Lead Serial Number: 755425
Implantable Pulse Generator Implant Date: 20140813
Lead Channel Impedance Value: 376 Ohm
Lead Channel Impedance Value: 500 Ohm
Lead Channel Pacing Threshold Amplitude: 0.5 V
Lead Channel Pacing Threshold Amplitude: 1 V
Lead Channel Pacing Threshold Pulse Width: 0.4 ms
Lead Channel Pacing Threshold Pulse Width: 0.5 ms
Lead Channel Setting Pacing Amplitude: 1.5 V
Lead Channel Setting Pacing Amplitude: 2 V
Lead Channel Setting Pacing Pulse Width: 0.4 ms
Lead Channel Setting Sensing Sensitivity: 2.5 mV
Pulse Gen Serial Number: 161924

## 2018-12-02 NOTE — Progress Notes (Signed)
Remote pacemaker transmission.   

## 2019-05-28 ENCOUNTER — Ambulatory Visit (INDEPENDENT_AMBULATORY_CARE_PROVIDER_SITE_OTHER): Payer: Medicare (Managed Care) | Admitting: *Deleted

## 2019-05-28 DIAGNOSIS — Z950 Presence of cardiac pacemaker: Secondary | ICD-10-CM

## 2019-05-28 LAB — CUP PACEART REMOTE DEVICE CHECK
Battery Remaining Longevity: 30 mo
Battery Remaining Percentage: 52 %
Brady Statistic RA Percent Paced: 71 %
Brady Statistic RV Percent Paced: 0 %
Date Time Interrogation Session: 20210416002800
Implantable Lead Implant Date: 20140813
Implantable Lead Implant Date: 20140813
Implantable Lead Location: 753859
Implantable Lead Location: 753860
Implantable Lead Model: 4469
Implantable Lead Model: 4470
Implantable Lead Serial Number: 581162
Implantable Lead Serial Number: 755425
Implantable Pulse Generator Implant Date: 20140813
Lead Channel Impedance Value: 371 Ohm
Lead Channel Impedance Value: 485 Ohm
Lead Channel Pacing Threshold Amplitude: 0.5 V
Lead Channel Pacing Threshold Amplitude: 1 V
Lead Channel Pacing Threshold Pulse Width: 0.4 ms
Lead Channel Pacing Threshold Pulse Width: 0.5 ms
Lead Channel Setting Pacing Amplitude: 1.5 V
Lead Channel Setting Pacing Amplitude: 2 V
Lead Channel Setting Pacing Pulse Width: 0.4 ms
Lead Channel Setting Sensing Sensitivity: 2.5 mV
Pulse Gen Serial Number: 161924

## 2019-05-28 NOTE — Progress Notes (Signed)
PPM Remote  

## 2019-08-27 ENCOUNTER — Ambulatory Visit (INDEPENDENT_AMBULATORY_CARE_PROVIDER_SITE_OTHER): Payer: Medicare (Managed Care) | Admitting: *Deleted

## 2019-08-27 DIAGNOSIS — I495 Sick sinus syndrome: Secondary | ICD-10-CM

## 2019-08-27 LAB — CUP PACEART REMOTE DEVICE CHECK
Battery Remaining Longevity: 30 mo
Battery Remaining Percentage: 50 %
Brady Statistic RA Percent Paced: 72 %
Brady Statistic RV Percent Paced: 0 %
Date Time Interrogation Session: 20210716073900
Implantable Lead Implant Date: 20140813
Implantable Lead Implant Date: 20140813
Implantable Lead Location: 753859
Implantable Lead Location: 753860
Implantable Lead Model: 4469
Implantable Lead Model: 4470
Implantable Lead Serial Number: 581162
Implantable Lead Serial Number: 755425
Implantable Pulse Generator Implant Date: 20140813
Lead Channel Impedance Value: 364 Ohm
Lead Channel Impedance Value: 478 Ohm
Lead Channel Pacing Threshold Amplitude: 0.5 V
Lead Channel Pacing Threshold Amplitude: 1 V
Lead Channel Pacing Threshold Pulse Width: 0.4 ms
Lead Channel Pacing Threshold Pulse Width: 0.5 ms
Lead Channel Setting Pacing Amplitude: 1.5 V
Lead Channel Setting Pacing Amplitude: 2 V
Lead Channel Setting Pacing Pulse Width: 0.4 ms
Lead Channel Setting Sensing Sensitivity: 2.5 mV
Pulse Gen Serial Number: 161924

## 2019-08-30 NOTE — Progress Notes (Signed)
Remote pacemaker transmission.   

## 2019-09-01 ENCOUNTER — Telehealth: Payer: Self-pay

## 2019-09-01 NOTE — Telephone Encounter (Signed)
AF alert >6 hours and ongoing. Presenting AF w/ VR 80-120bpm. NST events show AF w/ RVR at times. On Pacerone, no [place] noted.   Pt not on [place] due to history of bleeding.  No upcoming appts scheduled.    Attmepted to reach pt, no answer, LVM with Dc hours and # to return call.

## 2019-09-16 NOTE — Telephone Encounter (Addendum)
Second attempt:  LMOVM requesting call back to DC. Direct number and office hours provided.  Contacted Polly (DPR). LMOVM requesting that pt call the DC.

## 2019-09-17 ENCOUNTER — Inpatient Hospital Stay
Admission: EM | Admit: 2019-09-17 | Discharge: 2019-09-19 | DRG: 291 | Disposition: A | Discharge status: Home / Self Care | Payer: Medicare (Managed Care) | Attending: Internal Medicine | Admitting: Internal Medicine

## 2019-09-17 ENCOUNTER — Other Ambulatory Visit: Payer: Self-pay

## 2019-09-17 ENCOUNTER — Emergency Department: Payer: Medicare (Managed Care)

## 2019-09-17 DIAGNOSIS — I5031 Acute diastolic (congestive) heart failure: Secondary | ICD-10-CM | POA: Diagnosis not present

## 2019-09-17 DIAGNOSIS — E785 Hyperlipidemia, unspecified: Secondary | ICD-10-CM | POA: Diagnosis present

## 2019-09-17 DIAGNOSIS — K219 Gastro-esophageal reflux disease without esophagitis: Secondary | ICD-10-CM | POA: Diagnosis present

## 2019-09-17 DIAGNOSIS — M1990 Unspecified osteoarthritis, unspecified site: Secondary | ICD-10-CM | POA: Diagnosis present

## 2019-09-17 DIAGNOSIS — I5033 Acute on chronic diastolic (congestive) heart failure: Secondary | ICD-10-CM | POA: Diagnosis present

## 2019-09-17 DIAGNOSIS — I2510 Atherosclerotic heart disease of native coronary artery without angina pectoris: Secondary | ICD-10-CM | POA: Diagnosis present

## 2019-09-17 DIAGNOSIS — I495 Sick sinus syndrome: Secondary | ICD-10-CM | POA: Diagnosis present

## 2019-09-17 DIAGNOSIS — Z881 Allergy status to other antibiotic agents status: Secondary | ICD-10-CM

## 2019-09-17 DIAGNOSIS — F329 Major depressive disorder, single episode, unspecified: Secondary | ICD-10-CM | POA: Diagnosis present

## 2019-09-17 DIAGNOSIS — Z950 Presence of cardiac pacemaker: Secondary | ICD-10-CM

## 2019-09-17 DIAGNOSIS — Z8249 Family history of ischemic heart disease and other diseases of the circulatory system: Secondary | ICD-10-CM

## 2019-09-17 DIAGNOSIS — I509 Heart failure, unspecified: Secondary | ICD-10-CM

## 2019-09-17 DIAGNOSIS — I4891 Unspecified atrial fibrillation: Secondary | ICD-10-CM | POA: Diagnosis present

## 2019-09-17 DIAGNOSIS — I4819 Other persistent atrial fibrillation: Secondary | ICD-10-CM | POA: Diagnosis present

## 2019-09-17 DIAGNOSIS — Z9181 History of falling: Secondary | ICD-10-CM

## 2019-09-17 DIAGNOSIS — I130 Hypertensive heart and chronic kidney disease with heart failure and stage 1 through stage 4 chronic kidney disease, or unspecified chronic kidney disease: Secondary | ICD-10-CM | POA: Diagnosis not present

## 2019-09-17 DIAGNOSIS — I252 Old myocardial infarction: Secondary | ICD-10-CM

## 2019-09-17 DIAGNOSIS — Z8261 Family history of arthritis: Secondary | ICD-10-CM

## 2019-09-17 DIAGNOSIS — Z882 Allergy status to sulfonamides status: Secondary | ICD-10-CM

## 2019-09-17 DIAGNOSIS — N184 Chronic kidney disease, stage 4 (severe): Secondary | ICD-10-CM | POA: Diagnosis not present

## 2019-09-17 DIAGNOSIS — J9611 Chronic respiratory failure with hypoxia: Secondary | ICD-10-CM

## 2019-09-17 DIAGNOSIS — Z87891 Personal history of nicotine dependence: Secondary | ICD-10-CM

## 2019-09-17 DIAGNOSIS — D631 Anemia in chronic kidney disease: Secondary | ICD-10-CM | POA: Diagnosis present

## 2019-09-17 DIAGNOSIS — Z79899 Other long term (current) drug therapy: Secondary | ICD-10-CM

## 2019-09-17 DIAGNOSIS — Z9981 Dependence on supplemental oxygen: Secondary | ICD-10-CM

## 2019-09-17 DIAGNOSIS — J9621 Acute and chronic respiratory failure with hypoxia: Secondary | ICD-10-CM | POA: Diagnosis present

## 2019-09-17 DIAGNOSIS — E1122 Type 2 diabetes mellitus with diabetic chronic kidney disease: Secondary | ICD-10-CM

## 2019-09-17 DIAGNOSIS — Z880 Allergy status to penicillin: Secondary | ICD-10-CM

## 2019-09-17 DIAGNOSIS — Z7982 Long term (current) use of aspirin: Secondary | ICD-10-CM

## 2019-09-17 DIAGNOSIS — I959 Hypotension, unspecified: Secondary | ICD-10-CM | POA: Diagnosis present

## 2019-09-17 DIAGNOSIS — J449 Chronic obstructive pulmonary disease, unspecified: Secondary | ICD-10-CM | POA: Diagnosis present

## 2019-09-17 DIAGNOSIS — Z833 Family history of diabetes mellitus: Secondary | ICD-10-CM

## 2019-09-17 DIAGNOSIS — N1832 Chronic kidney disease, stage 3b: Secondary | ICD-10-CM | POA: Diagnosis present

## 2019-09-17 DIAGNOSIS — Z955 Presence of coronary angioplasty implant and graft: Secondary | ICD-10-CM

## 2019-09-17 DIAGNOSIS — E039 Hypothyroidism, unspecified: Secondary | ICD-10-CM | POA: Diagnosis present

## 2019-09-17 DIAGNOSIS — Z20822 Contact with and (suspected) exposure to covid-19: Secondary | ICD-10-CM | POA: Diagnosis present

## 2019-09-17 DIAGNOSIS — M5410 Radiculopathy, site unspecified: Secondary | ICD-10-CM | POA: Diagnosis present

## 2019-09-17 DIAGNOSIS — E7800 Pure hypercholesterolemia, unspecified: Secondary | ICD-10-CM | POA: Diagnosis present

## 2019-09-17 HISTORY — DX: Atherosclerotic heart disease of native coronary artery without angina pectoris: I25.10

## 2019-09-17 HISTORY — DX: Other persistent atrial fibrillation: I48.19

## 2019-09-17 LAB — TROPONIN I (HIGH SENSITIVITY)
Troponin I (High Sensitivity): 19 ng/L — ABNORMAL HIGH (ref <18)
Troponin I (High Sensitivity): 21 ng/L — ABNORMAL HIGH (ref <18)

## 2019-09-17 LAB — BASIC METABOLIC PANEL
Anion gap: 11 (ref 5–15)
BUN: 19 mg/dL (ref 8–23)
CO2: 23 mmol/L (ref 22–32)
Calcium: 9.8 mg/dL (ref 8.9–10.3)
Chloride: 106 mmol/L (ref 98–111)
Creatinine, Ser: 1.58 mg/dL — ABNORMAL HIGH (ref 0.44–1.00)
GFR calc Af Amer: 34 mL/min — ABNORMAL LOW (ref >60)
GFR calc non Af Amer: 29 mL/min — ABNORMAL LOW (ref >60)
Glucose, Bld: 195 mg/dL — ABNORMAL HIGH (ref 70–99)
Potassium: 3.7 mmol/L (ref 3.5–5.1)
Sodium: 140 mmol/L (ref 135–145)

## 2019-09-17 LAB — BRAIN NATRIURETIC PEPTIDE: B Natriuretic Peptide: 175.3 pg/mL — ABNORMAL HIGH (ref 0.0–100.0)

## 2019-09-17 LAB — CBC
HCT: 34.2 % — ABNORMAL LOW (ref 36.0–46.0)
Hemoglobin: 11 g/dL — ABNORMAL LOW (ref 12.0–15.0)
MCH: 30.1 pg (ref 26.0–34.0)
MCHC: 32.2 g/dL (ref 30.0–36.0)
MCV: 93.4 fL (ref 80.0–100.0)
Platelets: 316 10*3/uL (ref 150–400)
RBC: 3.66 MIL/uL — ABNORMAL LOW (ref 3.87–5.11)
RDW: 14.9 % (ref 11.5–15.5)
WBC: 6.4 10*3/uL (ref 4.0–10.5)
nRBC: 0 % (ref 0.0–0.2)

## 2019-09-17 LAB — TSH: TSH: 1.461 u[IU]/mL (ref 0.350–4.500)

## 2019-09-17 LAB — SARS CORONAVIRUS 2 BY RT PCR (HOSPITAL ORDER, PERFORMED IN ~~LOC~~ HOSPITAL LAB): SARS Coronavirus 2: NEGATIVE

## 2019-09-17 MED ORDER — ONDANSETRON HCL 4 MG/2ML IJ SOLN: 4.0000 mg | Freq: Four times a day (QID) | INTRAMUSCULAR | Status: DC | PRN

## 2019-09-17 MED ORDER — ROSUVASTATIN CALCIUM 20 MG PO TABS
40.0000 mg | ORAL_TABLET | Freq: Every day | ORAL | Status: DC
  Administered 2019-09-18: 40 mg via ORAL
  Filled 2019-09-17 (×2): qty 2
  Filled 2019-09-17: qty 4
  Filled 2019-09-17: qty 2

## 2019-09-17 MED ORDER — SODIUM CHLORIDE 0.9% FLUSH
3.0000 mL | Freq: Two times a day (BID) | INTRAVENOUS | Status: DC
  Administered 2019-09-17 – 2019-09-18 (×2): 3 mL via INTRAVENOUS

## 2019-09-17 MED ORDER — ASPIRIN EC 81 MG PO TBEC
81.0000 mg | DELAYED_RELEASE_TABLET | Freq: Every day | ORAL | Status: DC
  Administered 2019-09-18: 81 mg via ORAL
  Filled 2019-09-17 (×2): qty 1

## 2019-09-17 MED ORDER — ENOXAPARIN SODIUM 30 MG/0.3ML ~~LOC~~ SOLN
30.0000 mg | SUBCUTANEOUS | Status: DC
  Administered 2019-09-17 – 2019-09-18 (×2): 30 mg via SUBCUTANEOUS
  Filled 2019-09-17 (×2): qty 0.3

## 2019-09-17 MED ORDER — METOPROLOL TARTRATE 25 MG PO TABS
12.5000 mg | ORAL_TABLET | Freq: Four times a day (QID) | ORAL | Status: DC
  Administered 2019-09-18 (×2): 12.5 mg via ORAL
  Filled 2019-09-17 (×2): qty 1

## 2019-09-17 MED ORDER — SODIUM CHLORIDE 0.9% FLUSH: 3.0000 mL | INTRAVENOUS | Status: DC | PRN

## 2019-09-17 MED ORDER — PANTOPRAZOLE SODIUM 20 MG PO TBEC
20.0000 mg | DELAYED_RELEASE_TABLET | Freq: Every day | ORAL | Status: DC
  Administered 2019-09-18: 20 mg via ORAL
  Filled 2019-09-17 (×3): qty 1

## 2019-09-17 MED ORDER — AMIODARONE HCL IN DEXTROSE 360-4.14 MG/200ML-% IV SOLN
60.0000 mg/h | INTRAVENOUS | Status: DC
  Administered 2019-09-17 (×2): 60 mg/h via INTRAVENOUS
  Filled 2019-09-17 (×3): qty 200

## 2019-09-17 MED ORDER — CARVEDILOL 6.25 MG PO TABS: 6.2500 mg | ORAL_TABLET | Freq: Two times a day (BID) | ORAL | Status: DC

## 2019-09-17 MED ORDER — AMIODARONE HCL IN DEXTROSE 360-4.14 MG/200ML-% IV SOLN: 30.0000 mg/h | INTRAVENOUS | Status: DC

## 2019-09-17 MED ORDER — TRAZODONE HCL 100 MG PO TABS
100.0000 mg | ORAL_TABLET | Freq: Every day | ORAL | Status: DC
  Filled 2019-09-17: qty 1

## 2019-09-17 MED ORDER — ACETAMINOPHEN 325 MG PO TABS
650.0000 mg | ORAL_TABLET | ORAL | Status: DC | PRN
  Administered 2019-09-17 – 2019-09-18 (×2): 650 mg via ORAL
  Filled 2019-09-17 (×2): qty 2

## 2019-09-17 MED ORDER — SODIUM CHLORIDE 0.9% FLUSH: 3.0000 mL | Freq: Once | INTRAVENOUS | Status: DC

## 2019-09-17 MED ORDER — FUROSEMIDE 10 MG/ML IJ SOLN: 40.0000 mg | Freq: Two times a day (BID) | INTRAMUSCULAR | Status: DC

## 2019-09-17 MED ORDER — HYDROCODONE-ACETAMINOPHEN 10-325 MG PO TABS: 1.0000 | ORAL_TABLET | Freq: Four times a day (QID) | ORAL | Status: DC | PRN

## 2019-09-17 MED ORDER — FUROSEMIDE 10 MG/ML IJ SOLN
40.0000 mg | Freq: Every day | INTRAMUSCULAR | Status: DC
  Administered 2019-09-18: 40 mg via INTRAVENOUS
  Filled 2019-09-17 (×2): qty 4

## 2019-09-17 MED ORDER — IPRATROPIUM-ALBUTEROL 0.5-2.5 (3) MG/3ML IN SOLN: 3.0000 mL | Freq: Four times a day (QID) | RESPIRATORY_TRACT | Status: DC | PRN

## 2019-09-17 MED ORDER — CYCLOBENZAPRINE HCL 10 MG PO TABS: 5.0000 mg | ORAL_TABLET | Freq: Three times a day (TID) | ORAL | Status: DC | PRN

## 2019-09-17 MED ORDER — VITAMIN D 25 MCG (1000 UNIT) PO TABS
1000.0000 [IU] | ORAL_TABLET | Freq: Every day | ORAL | Status: DC
  Administered 2019-09-18: 1000 [IU] via ORAL
  Filled 2019-09-17 (×2): qty 1

## 2019-09-17 MED ORDER — AMIODARONE LOAD VIA INFUSION
150.0000 mg | Freq: Once | INTRAVENOUS | Status: AC
  Administered 2019-09-17: 150 mg via INTRAVENOUS
  Filled 2019-09-17: qty 83.34

## 2019-09-17 MED ORDER — FUROSEMIDE 10 MG/ML IJ SOLN
40.0000 mg | Freq: Once | INTRAMUSCULAR | Status: AC
  Administered 2019-09-17: 40 mg via INTRAVENOUS
  Filled 2019-09-17: qty 4

## 2019-09-17 MED ORDER — SENNOSIDES-DOCUSATE SODIUM 8.6-50 MG PO TABS
2.0000 | ORAL_TABLET | Freq: Every day | ORAL | Status: DC
  Administered 2019-09-17 – 2019-09-18 (×2): 2 via ORAL
  Filled 2019-09-17 (×2): qty 2

## 2019-09-17 MED ORDER — DULOXETINE HCL 30 MG PO CPEP
30.0000 mg | ORAL_CAPSULE | Freq: Every day | ORAL | Status: DC
  Administered 2019-09-18: 30 mg via ORAL
  Filled 2019-09-17 (×2): qty 1

## 2019-09-17 MED ORDER — SODIUM CHLORIDE 0.9 % IV SOLN: 250.0000 mL | INTRAVENOUS | Status: DC | PRN

## 2019-09-17 MED ORDER — INSULIN ASPART 100 UNIT/ML ~~LOC~~ SOLN: 0.0000 [IU] | Freq: Three times a day (TID) | SUBCUTANEOUS | Status: DC

## 2019-09-17 MED ORDER — AMIODARONE HCL 100 MG PO TABS
100.0000 mg | ORAL_TABLET | Freq: Every day | ORAL | Status: DC
  Filled 2019-09-17: qty 1

## 2019-09-17 NOTE — ED Notes (Signed)
Pt transported to xray 

## 2019-09-17 NOTE — Progress Notes (Signed)
pacerone decreased to 16.7 cc hr

## 2019-09-17 NOTE — Consult Note (Signed)
  Amiodarone Drug - Drug Interaction Consult Note  Recommendations:   No changes recommended at this time  pharmacy will continue to follow for DDIs with amiodarone  Current DDIs include carvedilol, ondansetron (prn), rosuvastatin and furosemide  Amiodarone is metabolized by the cytochrome P450 system and therefore has the potential to cause many drug interactions. Amiodarone has an average plasma half-life of 50 days (range 20 to 100 days).   There is potential for drug interactions to occur several weeks or months after stopping treatment and the onset of drug interactions may be slow after initiating amiodarone.   [x]  Statins: Increased risk of myopathy. Simvastatin- restrict dose to 20mg  daily. Other statins: counsel patients to report any muscle pain or weakness immediately.  []  Anticoagulants: Amiodarone can increase anticoagulant effect. Consider warfarin dose reduction. Patients should be monitored closely and the dose of anticoagulant altered accordingly, remembering that amiodarone levels take several weeks to stabilize.  []  Antiepileptics: Amiodarone can increase plasma concentration of phenytoin, the dose should be reduced. Note that small changes in phenytoin dose can result in large changes in levels. Monitor patient and counsel on signs of toxicity.  [x]  Beta blockers: increased risk of bradycardia, AV block and myocardial depression. Sotalol - avoid concomitant use.  []   Calcium channel blockers (diltiazem and verapamil): increased risk of bradycardia, AV block and myocardial depression.  []   Cyclosporine: Amiodarone increases levels of cyclosporine. Reduced dose of cyclosporine is recommended.  []  Digoxin dose should be halved when amiodarone is started.  [x]  Diuretics: increased risk of cardiotoxicity if hypokalemia occurs.  []  Oral hypoglycemic agents (glyburide, glipizide, glimepiride): increased risk of hypoglycemia. Patient's glucose levels should be monitored  closely when initiating amiodarone therapy.   []  Drugs that prolong the QT interval:  Torsades de pointes risk may be increased with concurrent use - avoid if possible.  Monitor QTc, also keep magnesium/potassium WNL if concurrent therapy can't be avoided. Marland Kitchen Antibiotics: e.g. fluoroquinolones, erythromycin. . Antiarrhythmics: e.g. quinidine, procainamide, disopyramide, sotalol. . Antipsychotics: e.g. phenothiazines, haloperidol.  . Lithium, tricyclic antidepressants, and methadone. Thank You,  Evelyn Simmons  09/17/2019 12:59 PM

## 2019-09-17 NOTE — Consult Note (Signed)
Cardiology Consultation:   Patient ID: Lucella Pommier; [id number]; [birth year]   Admit date: 09/17/2019 Date of Consult: 09/17/2019  Primary Care Provider: Leticia Penna, MD Primary Cardiologist: Gaynelle Cage Electrophysiologist:  Caryl Comes   Patient Profile:   Ellisyn Icenhower is a 86 y.o. female with a hx of CAD status post remote PCI to the LAD followed by PCI/stenting to the RCA in 01/2010, sick sinus syndrome s/p [employer] PPM in 2014, persistent Afib previously on Eliquis which was subsequently discontinued secondary to bleeding requiring transfusions and iron infusions currently on aspirin and amiodarone, HFpEF, anemia, DM2, HTN, HLD, chronic hypoxic respiratory failure on supplemental oxygen via nasal cannula at 3 L secondary to COPD secondary to prior tobacco use, and GERD who is being seen today for the evaluation of acute on chronic HFpEF and A. fib with RVR at the request of Dr. Francine Graven.  History of Present Illness:   Ms. Zervas was previously followed by cardiology in [place], [place] for persistent A. fib and sick sinus syndrome, undergoing [employer] dual-chamber pacemaker on 09/23/2012.  Her anticoagulation has previously been discontinued secondary to bleeding requiring transfusions and iron infusion.  She has been maintained on aspirin and amiodarone therapy.  Regarding her ischemic heart disease, she last underwent nuclear stress testing in 2016 which revealed a normal LVEF and no evidence of ischemia.  Most recent echo from 2016 showed an LVEF of 55%, basal to mid inferior wall hypokinesis, grade 2 diastolic dysfunction, mild aortic stenosis, mitral annular calcification with moderate mitral regurgitation, mild tricuspid regurgitation, mild biatrial enlargement, and normal RV systolic function.  She was last seen in the office in 08/2017 and had recently noted abrupt vision loss of the left eye suggestive of retinal artery occlusion.  Leading up to this event  device interrogation showed no evidence of A. fib around that timeframe.  Subsequent carotid artery ultrasound showed less than 50% bilateral ICA stenosis.  She is followed by Claudia Desanctis.  Our office was notified on 09/01/2019 she developed A. fib with ventricular rates ranging from 80 to 120 bpm with a duration greater than 6 hours.  Multiple messages were left for the patient to contact our office.  She was in her usual state of health until a couple days prior when she reported feeling like her "pacemaker was firing."  She was hoping to go see [facility] for this though prior to being seen she developed sudden onset of dyspnea.  EMS was called who found the patient in A. fib with RVR with relative hypotension and was started on an amnio drip.  Upon the patient's arrival to [facility] they were found to have BP 119 trending to 98 systolic, HR 92J to 1 teens bpm, temp afebrile, oxygen saturation 100% on nasal cannula at 3 L, weight documented at 79.4 kg. EKG showed A. fib with RVR transitioning to V paced rhythm, 114 bpm, no acute ST-T changes, CXR showed cardiomegaly with pulmonary vascular congestion as well as a small left pleural effusion and mild interstitial edema. Labs showed BNP 175, initial high-sensitivity troponin 19 with a delta pending, potassium 3.7, BUN 19, serum creatinine 1.58 with last serum 1.1 from 08/2017, Hgb 11.0 which is stable when compared to CBC from 08/2017.  In the ED she received IV Lasix 40 mg x 1 and reported vigorous urine output, though none documented.  She was also continued on amiodarone drip secondary to A. fib with RVR with relative hypotension precluding beta-blocker or calcium  channel blocker.  She has not received digoxin given renal dysfunction of uncertain chronicity.  She has denied any chest pain, palpitations, dizziness, presyncope, or syncope.  She has stable 3 pillow orthopnea.  She does report some lower extremity swelling without abdominal distention, PND, or  early satiety.  She does add excess salt to her food.  She is uncertain what her baseline weight is or how her weight trend has been.  She reports compliance with PTA dose to Lasix 40 mg twice daily.  Currently, she notes her dyspnea is improving.  She remains in A. fib on amiodarone drip with ventricular rates in the 90s to low 100s bpm.  Past Medical History:  Diagnosis Date  . Arthritis   . Asthma   . CAD (coronary artery disease)   . COPD (chronic obstructive pulmonary disease) ([place])   . Depression   . Diabetes mellitus without complication ([place])   . Diastolic heart failure ([place])   . Diverticulitis   . GERD (gastroesophageal reflux disease)   . Heart disease   . Hyperlipidemia   . Hypertension   . Hypothyroid    pt unsure, not currently on medication  . MI (myocardial infarction) ([place])    1995  . Persistent atrial fibrillation ([place])   . Radiculopathy     Past Surgical History:  Procedure Laterality Date  . ABDOMINAL HYSTERECTOMY    . APPENDECTOMY    . CARDIAC CATHETERIZATION    . CORONARY ANGIOPLASTY WITH STENT PLACEMENT  12/11/1993   RCA  . FOOT SURGERY Bilateral    Over 25 years ago- Bunionectomy  . INSERT / REPLACE / REMOVE PACEMAKER  09/2012   Model # S063  serial # [id number]  . JOINT REPLACEMENT     bilateral knee replacements     Home Meds: Prior to Admission medications   Medication Sig Start Date End Date Taking? Authorizing Provider  albuterol (PROVENTIL HFA;VENTOLIN HFA) 108 (90 Base) MCG/ACT inhaler Inhale 2 puffs into the lungs every 6 (six) hours as needed for wheezing or shortness of breath.    [provider]  amiodarone (PACERONE) 100 MG tablet Take 1 tablet (100 mg total) by mouth daily. 08/12/17   Deboraha Sprang, MD  amLODipine (NORVASC) 2.5 MG tablet Take 2.5 mg by mouth daily.    [provider]  aspirin EC 81 MG tablet Take 81 mg by mouth daily.    [provider]  Blood Glucose Monitoring Suppl (ACCU-CHEK NANO SMARTVIEW)  W/DEVICE KIT Please issue nano meter by AccuChek along with test strips at checking frequency of 2x daily. 90 day supply. E11.9 05/13/14   Phadke, Karsten Ro, MD  cholecalciferol (VITAMIN D) 1000 units tablet Take 1,000 Units by mouth daily.    [provider]  cyclobenzaprine (FLEXERIL) 5 MG tablet Take 5 mg by mouth 3 (three) times daily as needed for muscle spasms.    [provider]  DULoxetine (CYMBALTA) 30 MG capsule Take 30 mg by mouth daily.    [provider]  furosemide (LASIX) 40 MG tablet Take 1 tablet (40 mg total) by mouth 2 (two) times daily. 05/09/14   Minna Merritts, MD  glucose blood (FREESTYLE LITE) test strip Use as instructed 05/30/14   Rubbie Battiest, RN  HYDROcodone-acetaminophen (NORCO) 10-325 MG per tablet Take 1 tablet by mouth every 4 (four) hours as needed. 05/23/14   Rubbie Battiest, RN  ipratropium-albuterol (DUONEB) 0.5-2.5 (3) MG/3ML SOLN Take 3 mLs by nebulization.    [provider]  Iron-Vitamin C (VITRON-C PO) Take by mouth. Take one tablet by mouth twice daily on Monday/ Wednesday/ Friday    [provider]  Lancets (FREESTYLE) lancets Use as instructed 05/30/14   Rubbie Battiest, RN  liraglutide (VICTOZA) 18 MG/3ML SOPN Inject into the skin.    [provider]  pantoprazole (PROTONIX) 20 MG tablet Take 20 mg by mouth daily.    [provider]  rosuvastatin (CRESTOR) 40 MG tablet Take 1 tablet (40 mg total) by mouth daily. 05/09/14   Minna Merritts, MD  Sennosides-Docusate Sodium (SENEXON-S PO) Take by mouth. Take two tablets by mouth once daily    [provider]  traZODone (DESYREL) 100 MG tablet Take 100 mg by mouth at bedtime.    [provider]    Inpatient Medications: Scheduled Meds: . aspirin EC  81 mg Oral Daily  . cholecalciferol  1,000 Units Oral Daily  . DULoxetine  30 mg Oral Daily  . enoxaparin (LOVENOX) injection  30 mg Subcutaneous Q24H  . [START ON 09/18/2019]  furosemide  40 mg Intravenous Daily  . insulin aspart  0-15 Units Subcutaneous TID WC  . metoprolol tartrate  12.5 mg Oral Q6H  . pantoprazole  20 mg Oral Daily  . rosuvastatin  40 mg Oral Daily  . senna-docusate  2 tablet Oral QHS  . sodium chloride flush  3 mL Intravenous Once  . sodium chloride flush  3 mL Intravenous Q12H  . traZODone  100 mg Oral QHS   Continuous Infusions: . sodium chloride     PRN Meds: sodium chloride, acetaminophen, cyclobenzaprine, HYDROcodone-acetaminophen, ipratropium-albuterol, ondansetron (ZOFRAN) IV, sodium chloride flush  Allergies:   Allergies  Allergen Reactions  . Other Other (See Comments)  . Sulfa Antibiotics     Pt unsure reaction, was told in hospital she had allergy  . Penicillin G Rash  . Penicillins Nausea And Vomiting and Rash    Social History:   Social History   Socioeconomic History  . Marital status: Widowed    Spouse name: Not on file  . Number of children: Not on file  . Years of education: Not on file  . Highest education level: Not on file  Occupational History  . Not on file  Tobacco Use  . Smoking status: Former Smoker    Quit date: 12/10/1993    Years since quitting: 25.7  . Smokeless tobacco: Former Network engineer and Sexual Activity  . Alcohol use: Yes    Alcohol/week: 0.0 standard drinks    Comment: Rare  . Drug use: No  . Sexual activity: Not Currently  Other Topics Concern  . Not on file  Social History Narrative   Moved from [place] with eldest daughter   7 children, 11 grandchildren, 68 GG children    Social Determinants of Health   Financial Resource Strain:   . Difficulty of Paying Living Expenses:   Food Insecurity:   . Worried About Charity fundraiser in the Last Year:   . Arboriculturist in the Last Year:   Transportation Needs:   . Film/video editor (Medical):   Marland Kitchen Lack of Transportation (Non-Medical):   Physical Activity:   . Days of Exercise per Week:   . Minutes of  Exercise per Session:   Stress:   . Feeling of Stress :   Social Connections:   . Frequency of Communication with Friends and Family:   . Frequency of Social  Gatherings with Friends and Family:   . Attends Religious Services:   . Active Member of Clubs or Organizations:   . Attends Archivist Meetings:   Marland Kitchen Marital Status:   Intimate Partner Violence:   . Fear of Current or Ex-Partner:   . Emotionally Abused:   Marland Kitchen Physically Abused:   . Sexually Abused:      Family History:   Family History  Problem Relation Age of Onset  . Arthritis Mother   . Heart disease Mother   . Hypertension Mother   . Diabetes Mother   . Arthritis Father   . Heart disease Father   . Hypertension Father   . Cancer Sister        breast cancer  . Diabetes Brother   . Cancer Daughter        lung cancer  . Diabetes Sister     ROS:  Review of Systems  Constitutional: Positive for malaise/fatigue. Negative for chills, diaphoresis, fever and weight loss.  HENT: Positive for congestion.   Eyes: Negative for discharge and redness.  Respiratory: Positive for shortness of breath. Negative for cough, sputum production and wheezing.   Cardiovascular: Positive for orthopnea and leg swelling. Negative for chest pain, palpitations, claudication and PND.       Stable, 3-pillow orthopnea   Gastrointestinal: Negative for abdominal pain, heartburn, nausea and vomiting.  Musculoskeletal: Negative for falls and myalgias.  Skin: Negative for rash.  Neurological: Positive for weakness. Negative for dizziness, tingling, tremors, sensory change, speech change, focal weakness and loss of consciousness.  Endo/Heme/Allergies: Does not bruise/bleed easily.  Psychiatric/Behavioral: Negative for substance abuse. The patient is not nervous/anxious.   All other systems reviewed and are negative.     Physical Exam/Data:   Vitals:   09/17/19 1300 09/17/19 1330 09/17/19 1334 09/17/19 1529  BP: 115/65 123/73  98/68    Pulse: (!) 105 (!) 104 95 89  Resp: 18 17 (!) 25 19  Temp:    98.2 F (36.8 C)  TempSrc:    Oral  SpO2: 100% 100% 100% 100%  Weight:    79.2 kg  Height:    '4\' 11"'  (1.499 m)   No intake or output data in the 24 hours ending 09/17/19 1703 Filed Weights   09/17/19 1050 09/17/19 1529  Weight: 79.4 kg 79.2 kg   Body mass index is 35.27 kg/m.   Physical Exam: General: Well developed, well nourished, in no acute distress. Head: Normocephalic, atraumatic, sclera non-icteric, no xanthomas, nares without discharge.  Neck: Negative for carotid bruits. JVD is mildly elevated. Lungs: Clear bilaterally to auscultation without wheezes, rales, or rhonchi. Breathing is unlabored. Heart: Irregularly irregular and mildly tachycardic with S1 S2. No murmurs, rubs, or gallops appreciated. Abdomen: Soft, non-tender, non-distended with normoactive bowel sounds. No hepatomegaly. No rebound/guarding. No obvious abdominal masses. Msk:  Strength and tone appear normal for age. Extremities: No clubbing or cyanosis. No edema. Distal pedal pulses are 2+ and equal bilaterally. Neuro: Alert and oriented X 3. No facial asymmetry. No focal deficit. Moves all extremities spontaneously. Psych:  Responds to questions appropriately with a normal affect.   EKG:  The EKG was personally reviewed and demonstrates: A. fib with RVR with transition to V pacing, 114 bpm, no acute ST-T changes Telemetry:  Telemetry was personally reviewed and demonstrates: Afib, 90s to low 100s bpm  Weights: Filed Weights   09/17/19 1050 09/17/19 1529  Weight: 79.4 kg 79.2 kg    Relevant CV Studies:  Nuclear  stress test 07/2014 Jefm Bryant): LVEF= 63 %   FINDINGS:  Regional wall motion:reveals normal myocardial thickening and wall  motion.  The overall quality of the study is good.  Artifacts noted: yes  Left ventricular cavity: enlarged.Permanent right ventricular uptake   Perfusion Analysis:SPECT images demonstrate  homogeneous tracer  distribution throughout the myocardium. __________  2D echo 07/2014 Jefm Bryant): INTERPRETATION  NORMAL LEFT VENTRICULAR SYSTOLIC FUNCTION  NORMAL RIGHT VENTRICULAR SYSTOLIC FUNCTION  MODERATE VALVULAR REGURGITATION (See above)  MILD VALVULAR STENOSIS (See above)  MILD BIATRIAL ENLARGEMENT  BASAL-TO-MID INFERIOR WALL HYPOKINESIS  PACER WIRE NOTED  Normal overall left ventricular function ejection fraction greater than 55%     Laboratory Data:  Chemistry Recent Labs  Lab 09/17/19 1055  NA 140  K 3.7  CL 106  CO2 23  GLUCOSE 195*  BUN 19  CREATININE 1.58*  CALCIUM 9.8  GFRNONAA 29*  GFRAA 34*  ANIONGAP 11    No results for input(s): PROT, ALBUMIN, AST, ALT, ALKPHOS, BILITOT in the last 168 hours. Hematology Recent Labs  Lab 09/17/19 1055  WBC 6.4  RBC 3.66*  HGB 11.0*  HCT 34.2*  MCV 93.4  MCH 30.1  MCHC 32.2  RDW 14.9  PLT 316   Cardiac EnzymesNo results for input(s): TROPONINI in the last 168 hours. No results for input(s): TROPIPOC in the last 168 hours.  BNP Recent Labs  Lab 09/17/19 1344  BNP 175.3*    DDimer No results for input(s): DDIMER in the last 168 hours.  Radiology/Studies:  DG Chest 2 View  Result Date: 09/17/2019 IMPRESSION: Cardiomegaly with pulmonary vascular congestion. Pacemaker leads attached to right atrium and right ventricle. There is a small left pleural effusion with mild interstitial edema. The appearance is felt to be indicative of a degree of congestive heart failure. No consolidation. Aortic Atherosclerosis (ICD10-I70.0). Electronically Signed   By: Lowella Grip III M.D.   On: 09/17/2019 11:27    Assessment and Plan:   1.  Persistent A. fib with RVR: -Onset appears to be approximately 09/01/2018 in looking at telephone note from that date in which she was noted to have greater than 6 hours of A. fib with ventricular rates ranging from 180 to 120 bpm -Ventricular rates overall are reasonably  controlled in the 90s to low 100s bpm at this time -Overall, this is somewhat of a difficult situation given her prior history of GI bleeding on Eliquis and preference to avoid anticoagulation moving forward -CHA2DS2-VASc at least 7 (CHF, HTN, age x 2, DM, vascular disease, gender) -Given she has not been anticoagulated and declines anticoagulation at this time we will discontinue amiodarone drip and oral amiodarone in an effort to reduce potential for pharmacologic cardioversion as onset of her A. fib is uncertain -Start Lopressor 12.5 mg every 6 hours with hold parameters systolic BP less than 734 mmHg systolic -Digoxin is not a great option given her underlying renal dysfunction of uncertain chronicity with a serum creatinine of 1.58 at this time -Replete potassium to goal 4.0 -TSH pending  2.  Acute on chronic HFpEF: -Improving following IV Lasix in the ED -She does not appear grossly volume overloaded on exam with only minimal elevation of JVP, faint crackles along the bilateral bases, and no lower extremity swelling -Likely exacerbated by A. fib -No current documented urine output to date, though patient reports vigorous urine output in the ED with significant symptom improvement -Decrease Lasix to 40 mg daily starting on 8/7 with holding of p.m. dose Lasix this  afternoon -Echo pending -Daily weights -Strict I's and O's -CHF education  3.  CAD involving native coronary arteries with elevated troponin: -Initial troponin minimally elevated at 19 with delta pending -No symptoms of chest pain -No indication for heparin drip at this time without dynamic elevation of troponin or symptoms consistent with unstable angina -Echo pending -ASA -Crestor -At this time, barring significant elevation in troponin or abnormal echo there are no plans for inpatient ischemic evaluation  4.  AKI: -Chronicity of her kidney disease is uncertain with a serum creatinine of 1.58 today with most recent serum  creatinine 1.1 from 2019  -Monitor with diuresis  5.  Anemia: -Uncertain etiology with history of prior GI bleeding -Hemoglobin this admission of 11.0 is stable when compared to lab value from 08/2017 -Management and follow-up per primary service  6.  HLD: -PTA Crestor -Check lipid panel for further risk stratification  7.  HTN: -Blood pressure soft on IV amiodarone which will be discontinued as outlined above -Hold PTA amlodipine to allow for BP room for adequate rate control -Metoprolol as above  8.  Sick sinus syndrome: -Status post [employer] PPM -Device appears to be functioning normally -Recommend follow-up with EP as an outpatient as she has not been seen since 2019  9. Chronic hypoxic respiratory failure: -Dyspnea is improving -Remains on supplemental oxygen at baseline 3 L -Management per internal medicine    For questions or updates, please contact [facility] Please consult [email or web address] for contact info under Cardiology/STEMI.   Signed, Christell Faith, PA-C [place] Pager: [phone] 09/17/2019, 5:03 PM

## 2019-09-17 NOTE — ED Provider Notes (Signed)
[facility] Emergency Department Provider Note    First MD Initiated Contact with Patient 09/17/19 1045     (approximate)  I have reviewed the triage vital signs and the nursing notes.   HISTORY  Chief Complaint Shortness of Breath (afib RVR)    HPI Evelyn Simmons is a 86 y.o. female with past medical history as listed below presents to the ER for evaluation of shortness of breath and palpitations chest discomfort feeling like something was stuck in her throat earlier this morning.  States his symptoms have since resolved.  Denies any chest pain.  Does wear chronic oxygen.  Denies any pressure.  No cough or congestion.    Past Medical History:  Diagnosis Date  . Arthritis   . Asthma   . Cardiac arrhythmia   . COPD (chronic obstructive pulmonary disease) ([place])   . Coronary artery disease   . Depression   . Diabetes mellitus without complication ([place])   . Diastolic heart failure ([place])   . Diverticulitis   . GERD (gastroesophageal reflux disease)   . Heart disease   . Hyperlipidemia   . Hypertension   . Hypothyroid    pt unsure, not currently on medication  . MI (myocardial infarction) ([place])    1995  . Radiculopathy    Family History  Problem Relation Age of Onset  . Arthritis Mother   . Heart disease Mother   . Hypertension Mother   . Diabetes Mother   . Arthritis Father   . Heart disease Father   . Hypertension Father   . Cancer Sister        breast cancer  . Diabetes Brother   . Cancer Daughter        lung cancer  . Diabetes Sister    Past Surgical History:  Procedure Laterality Date  . ABDOMINAL HYSTERECTOMY    . APPENDECTOMY    . CARDIAC CATHETERIZATION    . CORONARY ANGIOPLASTY WITH STENT PLACEMENT  12/11/1993   RCA  . FOOT SURGERY Bilateral    Over 25 years ago- Bunionectomy  . INSERT / REPLACE / REMOVE PACEMAKER  09/2012   Model # N629  serial # [id number]  . JOINT REPLACEMENT     bilateral knee replacements   Patient  Active Problem List   Diagnosis Date Noted  . Atrial fibrillation with rapid ventricular response ([place]) 09/17/2019  . Acute diastolic CHF (congestive heart failure) ([place]) 09/17/2019  . Mobility impaired 05/29/2014  . CAD (coronary artery disease) 05/09/2014  . History of coronary artery stent placement 05/09/2014  . Hyperlipidemia 05/09/2014  . Bilateral low back pain with left-sided sciatica 04/21/2014  . Knee effusion 04/21/2014  . Type 2 diabetes mellitus with stage 4 chronic kidney disease ([place]) 04/21/2014  . Pacemaker 04/21/2014  . Encounter to establish care 04/20/2014      Prior to Admission medications   Medication Sig Start Date End Date Taking? Authorizing Provider  albuterol (PROVENTIL HFA;VENTOLIN HFA) 108 (90 Base) MCG/ACT inhaler Inhale 2 puffs into the lungs every 6 (six) hours as needed for wheezing or shortness of breath.    [provider]  amiodarone (PACERONE) 100 MG tablet Take 1 tablet (100 mg total) by mouth daily. 08/12/17   Deboraha Sprang, MD  amLODipine (NORVASC) 2.5 MG tablet Take 2.5 mg by mouth daily.    [provider]  aspirin EC 81 MG tablet Take 81 mg by mouth daily.    [provider]  Blood Glucose Monitoring  Suppl (ACCU-CHEK NANO SMARTVIEW) W/DEVICE KIT Please issue nano meter by AccuChek along with test strips at checking frequency of 2x daily. 90 day supply. E11.9 05/13/14   Phadke, Karsten Ro, MD  cholecalciferol (VITAMIN D) 1000 units tablet Take 1,000 Units by mouth daily.    [provider]  cyclobenzaprine (FLEXERIL) 5 MG tablet Take 5 mg by mouth 3 (three) times daily as needed for muscle spasms.    [provider]  DULoxetine (CYMBALTA) 30 MG capsule Take 30 mg by mouth daily.    [provider]  furosemide (LASIX) 40 MG tablet Take 1 tablet (40 mg total) by mouth 2 (two) times daily. 05/09/14   Minna Merritts, MD  glucose blood (FREESTYLE LITE) test strip Use as instructed 05/30/14   Rubbie Battiest, RN  HYDROcodone-acetaminophen (NORCO) 10-325 MG per tablet Take 1 tablet by mouth every 4 (four) hours as needed. 05/23/14   Rubbie Battiest, RN  ipratropium-albuterol (DUONEB) 0.5-2.5 (3) MG/3ML SOLN Take 3 mLs by nebulization.    [provider]  Iron-Vitamin C (VITRON-C PO) Take by mouth. Take one tablet by mouth twice daily on Monday/ Wednesday/ Friday    [provider]  Lancets (FREESTYLE) lancets Use as instructed 05/30/14   Rubbie Battiest, RN  liraglutide (VICTOZA) 18 MG/3ML SOPN Inject into the skin.    [provider]  pantoprazole (PROTONIX) 20 MG tablet Take 20 mg by mouth daily.    [provider]  rosuvastatin (CRESTOR) 40 MG tablet Take 1 tablet (40 mg total) by mouth daily. 05/09/14   Minna Merritts, MD  Sennosides-Docusate Sodium (SENEXON-S PO) Take by mouth. Take two tablets by mouth once daily    [provider]  traZODone (DESYREL) 100 MG tablet Take 100 mg by mouth at bedtime.    [provider]    Allergies Other, Sulfa antibiotics, Penicillin g, and Penicillins    Social History Social History   Tobacco Use  . Smoking status: Former Smoker    Quit date: 12/10/1993    Years since quitting: 25.7  . Smokeless tobacco: Former Network engineer Use Topics  . Alcohol use: Yes    Alcohol/week: 0.0 standard drinks    Comment: Rare  . Drug use: No    Review of Systems Patient denies headaches, rhinorrhea, blurry vision, numbness, shortness of breath, chest pain, edema, cough, abdominal pain, nausea, vomiting, diarrhea, dysuria, fevers, rashes or hallucinations unless otherwise stated above in HPI. ____________________________________________   PHYSICAL EXAM:  VITAL SIGNS: Vitals:   09/17/19 1330 09/17/19 1334  BP: 123/73   Pulse: (!) 104 95  Resp: 17 (!) 25  Temp:    SpO2: 100% 100%    Constitutional: Alert and oriented.  Eyes: Conjunctivae are normal.  Head: Atraumatic. Nose: No  congestion/rhinnorhea. Mouth/Throat: Mucous membranes are moist.   Neck: No stridor. Painless ROM.  Cardiovascular: Normal rate, regular rhythm. Grossly normal heart sounds.  Good peripheral circulation. Respiratory: tachypnea with bibasilar crackles Gastrointestinal: Soft and nontender. No distention. No abdominal bruits. No CVA tenderness. Genitourinary:  Musculoskeletal: No lower extremity tenderness, 2+ edema.  No joint effusions. Neurologic:  Normal speech and language. No gross focal neurologic deficits are appreciated. No facial droop Skin:  Skin is warm, dry and intact. No rash noted. Psychiatric: Mood and affect are normal. Speech and behavior are normal.  ____________________________________________   LABS (all labs ordered are listed, but only abnormal results are displayed)  Results for orders placed or performed during  the hospital encounter of 09/17/19 (from the past 24 hour(s))  Basic metabolic panel     Status: Abnormal   Collection Time: 09/17/19 10:55 AM  Result Value Ref Range   Sodium 140 135 - 145 mmol/L   Potassium 3.7 3.5 - 5.1 mmol/L   Chloride 106 98 - 111 mmol/L   CO2 23 22 - 32 mmol/L   Glucose, Bld 195 (H) 70 - 99 mg/dL   BUN 19 8 - 23 mg/dL   Creatinine, Ser 1.58 (H) 0.44 - 1.00 mg/dL   Calcium 9.8 8.9 - 10.3 mg/dL   GFR calc non Af Amer 29 (L) >60 mL/min   GFR calc Af Amer 34 (L) >60 mL/min   Anion gap 11 5 - 15  CBC     Status: Abnormal   Collection Time: 09/17/19 10:55 AM  Result Value Ref Range   WBC 6.4 4.0 - 10.5 K/uL   RBC 3.66 (L) 3.87 - 5.11 MIL/uL   Hemoglobin 11.0 (L) 12.0 - 15.0 g/dL   HCT 34.2 (L) 36 - 46 %   MCV 93.4 80.0 - 100.0 fL   MCH 30.1 26.0 - 34.0 pg   MCHC 32.2 30.0 - 36.0 g/dL   RDW 14.9 11.5 - 15.5 %   Platelets 316 150 - 400 K/uL   nRBC 0.0 0.0 - 0.2 %  Troponin I (High Sensitivity)     Status: Abnormal   Collection Time: 09/17/19 10:55 AM  Result Value Ref Range   Troponin I (High Sensitivity) 19 (H) <18 ng/L    Brain natriuretic peptide     Status: Abnormal   Collection Time: 09/17/19  1:44 PM  Result Value Ref Range   B Natriuretic Peptide 175.3 (H) 0.0 - 100.0 pg/mL   ____________________________________________  EKG My review and personal interpretation at Time: 10:49   Indication: sob  Rate: 115  Rhythm: afib Axis: normal Other: afib with rvr, wide qrs complex, abnormal ekg ____________________________________________  RADIOLOGY  I personally reviewed all radiographic images ordered to evaluate for the above acute complaints and reviewed radiology reports and findings.  These findings were personally discussed with the patient.  Please see medical record for radiology report.  ____________________________________________   PROCEDURES  Procedure(s) performed:  .Critical Care Performed by: Merlyn Lot, MD Authorized by: Merlyn Lot, MD   Critical care provider statement:    Critical care time (minutes):  35   Critical care time was exclusive of:  Separately billable procedures and treating other patients   Critical care was necessary to treat or prevent imminent or life-threatening deterioration of the following conditions:  Cardiac failure and respiratory failure   Critical care was time spent personally by me on the following activities:  Development of treatment plan with patient or surrogate, discussions with consultants, evaluation of patient's response to treatment, examination of patient, obtaining history from patient or surrogate, ordering and performing treatments and interventions, ordering and review of laboratory studies, ordering and review of radiographic studies, pulse oximetry, re-evaluation of patient's condition and review of old charts      Critical Care performed: yes ____________________________________________   INITIAL IMPRESSION / ASSESSMENT AND PLAN / ED COURSE  Pertinent labs & imaging results that were available during my care of the  patient were reviewed by me and considered in my medical decision making (see chart for details).   DDX: Asthma, copd, CHF, pna, ptx, malignancy, Pe, anemia   Adalei Ransom is a 86 y.o. who presents to the ED with shortness  of breath and symptoms as described above.  Patient presents with A. fib with RVR and hypoxia with O2 requirement.  Chest x-ray with signs of pulmonary edema.  Not febrile.  Does have some lower extremity edema as well.  The patient will be placed on continuous pulse oximetry and telemetry for monitoring.  Laboratory evaluation will be sent to evaluate for the above complaints.     Clinical Course as of Sep 16 1449  Fri Sep 17, 2019  1158 Discussed case with patient's primary care provider he states that she only uses oxygen as needed and as she is needing oxygen with findings suggestive of CHF will give a dose of Lasix.  Her heart rate is improving on amiodarone infusion.   [PR]    Clinical Course User Index [PR] Merlyn Lot, MD    The patient was evaluated in Emergency Department today for the symptoms described in the history of present illness. He/she was evaluated in the context of the global COVID-19 pandemic, which necessitated consideration that the patient might be at risk for infection with the SARS-CoV-2 virus that causes COVID-19. Institutional protocols and algorithms that pertain to the evaluation of patients at risk for COVID-19 are in a state of rapid change based on information released by regulatory bodies including the CDC and federal and state organizations. These policies and algorithms were followed during the patient's care in the ED.  As part of my medical decision making, I reviewed the following data within the [place] notes reviewed and incorporated, Labs reviewed, notes from prior ED visits and [place] Controlled Substance Database   ____________________________________________   FINAL CLINICAL IMPRESSION(S) / ED  DIAGNOSES  Final diagnoses:  Acute on chronic congestive heart failure, unspecified heart failure type ([place])  Atrial fibrillation with rapid ventricular response (HCC)  Acute on chronic respiratory failure with hypoxia ([place])      NEW MEDICATIONS STARTED DURING THIS VISIT:  New Prescriptions   No medications on file     Note:  This document was prepared using Dragon voice recognition software and may include unintentional dictation errors.    Merlyn Lot, MD 09/17/19 1451

## 2019-09-17 NOTE — H&P (Addendum)
History and Physical    Evelyn Simmons HEN:[id number] DOB: May 08, 1929 DOA: 09/17/2019  PCP: Leticia Penna, MD   Patient coming from: [facility]  I have personally briefly reviewed patient's old medical records in [place]  Chief Complaint: Shortness of breath  HPI: Evelyn Simmons is a 86 y.o. female with medical history significant for COPD with chronic respiratory failure on 3 L of oxygen as needed, history of A. fib and status post pacemaker placement.  Patient was brought into the ER by EMS for evaluation of shortness of breath which started suddenly this morning.  Shortness of breath is mostly with exertion.  She denies having any chest pain, no cough, no fever, no chills, no nausea, no vomiting, no diaphoresis or palpitations.  She has no lower extremity swelling EMS patient was noted to be in rapid A. fib, blood pressure was 100/68 and her pulse oximetry was 97% on 3 L. Upon arrival to the ER patient was noted to be in rapid A. fib and was started on amiodarone drip. Labs show a BUN of 19, creatinine of 1.58, BNP of 175, troponin of 19 Twelve-lead EKG reviewed by me shows rapid atrial fibrillation Chest x-ray reviewed by me shows cardiomegaly with pulmonary vascular congestion. Pacemaker leads attached to right atrium and right ventricle. There is a small left pleural effusion with mild interstitial edema. The appearance is felt to be indicative of a degree of congestive heart failure.   ED Course: Patient is an 86 year old female who presents to the ER for evaluation of shortness of breath and is noted to be in A. fib with a rapid ventricular rate.  Chest x-ray is also suggestive of CHF.  Patient was started on amiodarone drip and will be admitted to the hospital for further evaluation.  Review of Systems: As per HPI otherwise 10 point review of systems negative.    Past Medical History:  Diagnosis Date  . Arthritis   . Asthma   . Cardiac arrhythmia   . COPD  (chronic obstructive pulmonary disease) ([place])   . Coronary artery disease   . Depression   . Diabetes mellitus without complication ([place])   . Diastolic heart failure ([place])   . Diverticulitis   . GERD (gastroesophageal reflux disease)   . Heart disease   . Hyperlipidemia   . Hypertension   . Hypothyroid    pt unsure, not currently on medication  . MI (myocardial infarction) ([place])    1995  . Radiculopathy     Past Surgical History:  Procedure Laterality Date  . ABDOMINAL HYSTERECTOMY    . APPENDECTOMY    . CARDIAC CATHETERIZATION    . CORONARY ANGIOPLASTY WITH STENT PLACEMENT  12/11/1993   RCA  . FOOT SURGERY Bilateral    Over 25 years ago- Bunionectomy  . INSERT / REPLACE / REMOVE PACEMAKER  09/2012   Model # T614  serial # [id number]  . JOINT REPLACEMENT     bilateral knee replacements     reports that she quit smoking about 25 years ago. She has quit using smokeless tobacco. She reports current alcohol use. She reports that she does not use drugs.  Allergies  Allergen Reactions  . Other Other (See Comments)  . Sulfa Antibiotics     Pt unsure reaction, was told in hospital she had allergy  . Penicillin G Rash  . Penicillins Nausea And Vomiting and Rash    Family History  Problem Relation Age of Onset  . Arthritis Mother   .  Heart disease Mother   . Hypertension Mother   . Diabetes Mother   . Arthritis Father   . Heart disease Father   . Hypertension Father   . Cancer Sister        breast cancer  . Diabetes Brother   . Cancer Daughter        lung cancer  . Diabetes Sister      Prior to Admission medications   Medication Sig Start Date End Date Taking? Authorizing Provider  albuterol (PROVENTIL HFA;VENTOLIN HFA) 108 (90 Base) MCG/ACT inhaler Inhale 2 puffs into the lungs every 6 (six) hours as needed for wheezing or shortness of breath.    [provider]  amiodarone (PACERONE) 100 MG tablet Take 1 tablet (100 mg total) by mouth daily. 08/12/17   Deboraha Sprang, MD  amLODipine (NORVASC) 2.5 MG tablet Take 2.5 mg by mouth daily.    [provider]  aspirin EC 81 MG tablet Take 81 mg by mouth daily.    [provider]  Blood Glucose Monitoring Suppl (ACCU-CHEK NANO SMARTVIEW) W/DEVICE KIT Please issue nano meter by AccuChek along with test strips at checking frequency of 2x daily. 90 day supply. E11.9 05/13/14   Phadke, Karsten Ro, MD  cholecalciferol (VITAMIN D) 1000 units tablet Take 1,000 Units by mouth daily.    [provider]  cyclobenzaprine (FLEXERIL) 5 MG tablet Take 5 mg by mouth 3 (three) times daily as needed for muscle spasms.    [provider]  DULoxetine (CYMBALTA) 30 MG capsule Take 30 mg by mouth daily.    [provider]  furosemide (LASIX) 40 MG tablet Take 1 tablet (40 mg total) by mouth 2 (two) times daily. 05/09/14   Minna Merritts, MD  glucose blood (FREESTYLE LITE) test strip Use as instructed 05/30/14   Rubbie Battiest, RN  HYDROcodone-acetaminophen (NORCO) 10-325 MG per tablet Take 1 tablet by mouth every 4 (four) hours as needed. 05/23/14   Rubbie Battiest, RN  ipratropium-albuterol (DUONEB) 0.5-2.5 (3) MG/3ML SOLN Take 3 mLs by nebulization.    [provider]  Iron-Vitamin C (VITRON-C PO) Take by mouth. Take one tablet by mouth twice daily on Monday/ Wednesday/ Friday    [provider]  Lancets (FREESTYLE) lancets Use as instructed 05/30/14   Rubbie Battiest, RN  liraglutide (VICTOZA) 18 MG/3ML SOPN Inject into the skin.    [provider]  pantoprazole (PROTONIX) 20 MG tablet Take 20 mg by mouth daily.    [provider]  rosuvastatin (CRESTOR) 40 MG tablet Take 1 tablet (40 mg total) by mouth daily. 05/09/14   Minna Merritts, MD  Sennosides-Docusate Sodium (SENEXON-S PO) Take by mouth. Take two tablets by mouth once daily    [provider]  traZODone (DESYREL) 100 MG tablet Take 100 mg by mouth at bedtime.    [provider]    Physical Exam: Vitals:   09/17/19 1050 09/17/19 1300 09/17/19 1330 09/17/19 1334  BP:  115/65 123/73   Pulse:  (!) 105 (!) 104 95  Resp:  18 17 (!) 25  Temp:      TempSrc:      SpO2:  100% 100% 100%  Weight: 79.4 kg     Height: _0  (1.499 m)        Vitals:   09/17/19 1050 09/17/19 1300 09/17/19 1330 09/17/19 1334  BP:  115/65 123/73   Pulse:  (!) 105 (!) 104 95  Resp:  18 17 (!) 25  Temp:      TempSrc:      SpO2:  100% 100% 100%  Weight: 79.4 kg     Height: _0  (1.499 m)       Constitutional: NAD, alert and oriented x 3 Eyes: PERRL, lids and conjunctivae normal ENMT: Mucous membranes are moist.  Neck: normal, supple, no masses, no thyromegaly Respiratory: Rales at the bases, scattered wheezing, no crackles. Normal respiratory effort. No accessory muscle use.  Cardiovascular: Rapid A. fib, no murmurs / rubs / gallops. No extremity edema. 2+ pedal pulses. No carotid bruits.  Abdomen: no tenderness, no masses palpated. No hepatosplenomegaly. Bowel sounds positive.  Musculoskeletal: no clubbing / cyanosis. No joint deformity upper and lower extremities.  Skin: no rashes, lesions, ulcers.  Neurologic: No gross focal neurologic deficit. Psychiatric: Normal mood and affect.   Labs on Admission: I have personally reviewed following labs and imaging studies  CBC: Recent Labs  Lab 09/17/19 1055  WBC 6.4  HGB 11.0*  HCT 34.2*  MCV 93.4  PLT 409   Basic Metabolic Panel: Recent Labs  Lab 09/17/19 1055  NA 140  K 3.7  CL 106  CO2 23  GLUCOSE 195*  BUN 19  CREATININE 1.58*  CALCIUM 9.8   GFR: Estimated Creatinine Clearance: 22.8 mL/min (A) (by C-G formula based on SCr of 1.58 mg/dL (H)). Liver Function Tests: No results for input(s): AST, ALT, ALKPHOS, BILITOT, PROT, ALBUMIN in the last 168 hours. No results for input(s): LIPASE, AMYLASE in the last 168 hours. No results for input(s): AMMONIA in the last 168 hours. Coagulation Profile: No results  for input(s): INR, PROTIME in the last 168 hours. Cardiac Enzymes: No results for input(s): CKTOTAL, CKMB, CKMBINDEX, TROPONINI in the last 168 hours. BNP (last 3 results) No results for input(s): PROBNP in the last 8760 hours. HbA1C: No results for input(s): HGBA1C in the last 72 hours. CBG: No results for input(s): GLUCAP in the last 168 hours. Lipid Profile: No results for input(s): CHOL, HDL, LDLCALC, TRIG, CHOLHDL, LDLDIRECT in the last 72 hours. Thyroid Function Tests: No results for input(s): TSH, T4TOTAL, FREET4, T3FREE, THYROIDAB in the last 72 hours. Anemia Panel: No results for input(s): VITAMINB12, FOLATE, FERRITIN, TIBC, IRON, RETICCTPCT in the last 72 hours. Urine analysis: No results found for: COLORURINE, APPEARANCEUR, LABSPEC, [place], GLUCOSEU, HGBUR, BILIRUBINUR, KETONESUR, PROTEINUR, UROBILINOGEN, NITRITE, LEUKOCYTESUR  Radiological Exams on Admission: DG Chest 2 View  Result Date: 09/17/2019 CLINICAL DATA:  Shortness of breath EXAM: CHEST - 2 VIEW COMPARISON:  None. FINDINGS: There is cardiomegaly with a degree of pulmonary venous hypertension. There is mild interstitial edema bilaterally. There is a small left pleural effusion. There is no appreciable airspace consolidation. There is aortic atherosclerosis. Pacemaker leads are attached to the right atrium and right ventricle. No adenopathy. No bone lesions. IMPRESSION: Cardiomegaly with pulmonary vascular congestion. Pacemaker leads attached to right atrium and right ventricle. There is a small left pleural effusion with mild interstitial edema. The appearance is felt to be indicative of a degree of congestive heart failure. No consolidation. Aortic Atherosclerosis (ICD10-I70.0). Electronically Signed   By: Lowella Grip III M.D.   On: 09/17/2019 11:27    EKG: Independently reviewed.  A. fib rapid ventricular rate  Assessment/Plan Principal Problem:   Acute diastolic CHF (congestive heart failure) (HCC) Active  Problems:   Type 2 diabetes mellitus with stage 4 chronic kidney disease (HCC)   Atrial fibrillation with rapid ventricular response ([place])  Acute diastolic dysfunction CHF Most likely secondary to rapid A. Fib Patient had a 2D echocardiogram in 2017 showed an LVEF of 55% We will place patient on Lasix 40 mg IV every 12 Patient now placed on a beta-blocker since he has an acute exacerbation Repeat 2D echocardiogram Consult cardiology Maintain low-sodium diet   A. fib with rapid ventricular rate Patient has a known history of atrial fibrillation but presents today tachycardic She is currently on amiodarone drip We will resume oral amiodarone and wean off the drip as tolerated Obtain TSH level Serial cardiac enzymes   Type 2 diabetes mellitus with stage IV chronic kidney disease Maintain consistent carbohydrate diet Glycemic control with sliding scale coverage   Anemia secondary to chronic kidney disease H&H is stable  DVT prophylaxis: Lovenox Code Status: Full code Family Communication: Greater than 50% of time was spent discussing patient's condition and plan of care with her at the bedside.  CODE STATUS was discussed and she wants to be a DO NOT RESUSCITATE.  She verbalizes understanding and agrees with the plan. Disposition Plan: Back to previous home environment Consults called: Cardiology    Aleeta Schmaltz MD [facility]     09/17/2019, 2:35 PM

## 2019-09-17 NOTE — ED Triage Notes (Signed)
Pt to ED via [facility] from [facility]. Per EMS pt was getting in wheelchair Lucianne Lei when she started having sudden SOB. Pt is on 3L [place] chronically. Pt denies CP. Pt stating she felt like her pacemaker wasn't firing. Upon EMS arrival pt afib RVR, BP 100/68, O2 96-97% 3L .   Upon arrival pt in NAD. Pt stating she hasn't felt good x2 days. Pt stating she feels like there is a lump in her throat.

## 2019-09-18 ENCOUNTER — Inpatient Hospital Stay (HOSPITAL_COMMUNITY)
Admit: 2019-09-18 | Discharge: 2019-09-18 | Disposition: A | Discharge status: Home / Self Care | Payer: Medicare (Managed Care) | Attending: Internal Medicine | Admitting: Internal Medicine

## 2019-09-18 DIAGNOSIS — I5033 Acute on chronic diastolic (congestive) heart failure: Secondary | ICD-10-CM | POA: Diagnosis present

## 2019-09-18 DIAGNOSIS — I252 Old myocardial infarction: Secondary | ICD-10-CM | POA: Diagnosis not present

## 2019-09-18 DIAGNOSIS — E785 Hyperlipidemia, unspecified: Secondary | ICD-10-CM

## 2019-09-18 DIAGNOSIS — N183 Chronic kidney disease, stage 3 unspecified: Secondary | ICD-10-CM

## 2019-09-18 DIAGNOSIS — Z9981 Dependence on supplemental oxygen: Secondary | ICD-10-CM | POA: Diagnosis not present

## 2019-09-18 DIAGNOSIS — I5031 Acute diastolic (congestive) heart failure: Secondary | ICD-10-CM | POA: Diagnosis not present

## 2019-09-18 DIAGNOSIS — N1832 Chronic kidney disease, stage 3b: Secondary | ICD-10-CM | POA: Diagnosis present

## 2019-09-18 DIAGNOSIS — D631 Anemia in chronic kidney disease: Secondary | ICD-10-CM | POA: Diagnosis present

## 2019-09-18 DIAGNOSIS — E7800 Pure hypercholesterolemia, unspecified: Secondary | ICD-10-CM | POA: Diagnosis present

## 2019-09-18 DIAGNOSIS — K219 Gastro-esophageal reflux disease without esophagitis: Secondary | ICD-10-CM | POA: Diagnosis present

## 2019-09-18 DIAGNOSIS — F329 Major depressive disorder, single episode, unspecified: Secondary | ICD-10-CM | POA: Diagnosis present

## 2019-09-18 DIAGNOSIS — M5410 Radiculopathy, site unspecified: Secondary | ICD-10-CM | POA: Diagnosis present

## 2019-09-18 DIAGNOSIS — I130 Hypertensive heart and chronic kidney disease with heart failure and stage 1 through stage 4 chronic kidney disease, or unspecified chronic kidney disease: Secondary | ICD-10-CM | POA: Diagnosis present

## 2019-09-18 DIAGNOSIS — J9621 Acute and chronic respiratory failure with hypoxia: Secondary | ICD-10-CM | POA: Diagnosis present

## 2019-09-18 DIAGNOSIS — M1990 Unspecified osteoarthritis, unspecified site: Secondary | ICD-10-CM | POA: Diagnosis present

## 2019-09-18 DIAGNOSIS — I495 Sick sinus syndrome: Secondary | ICD-10-CM | POA: Diagnosis present

## 2019-09-18 DIAGNOSIS — I4891 Unspecified atrial fibrillation: Secondary | ICD-10-CM | POA: Diagnosis present

## 2019-09-18 DIAGNOSIS — I509 Heart failure, unspecified: Secondary | ICD-10-CM

## 2019-09-18 DIAGNOSIS — I2510 Atherosclerotic heart disease of native coronary artery without angina pectoris: Secondary | ICD-10-CM | POA: Diagnosis present

## 2019-09-18 DIAGNOSIS — J449 Chronic obstructive pulmonary disease, unspecified: Secondary | ICD-10-CM | POA: Diagnosis present

## 2019-09-18 DIAGNOSIS — J9611 Chronic respiratory failure with hypoxia: Secondary | ICD-10-CM | POA: Diagnosis not present

## 2019-09-18 DIAGNOSIS — E039 Hypothyroidism, unspecified: Secondary | ICD-10-CM | POA: Diagnosis present

## 2019-09-18 DIAGNOSIS — Z7982 Long term (current) use of aspirin: Secondary | ICD-10-CM | POA: Diagnosis not present

## 2019-09-18 DIAGNOSIS — I4819 Other persistent atrial fibrillation: Secondary | ICD-10-CM | POA: Diagnosis present

## 2019-09-18 DIAGNOSIS — I959 Hypotension, unspecified: Secondary | ICD-10-CM | POA: Diagnosis present

## 2019-09-18 DIAGNOSIS — E1122 Type 2 diabetes mellitus with diabetic chronic kidney disease: Secondary | ICD-10-CM | POA: Diagnosis present

## 2019-09-18 DIAGNOSIS — Z79899 Other long term (current) drug therapy: Secondary | ICD-10-CM | POA: Diagnosis not present

## 2019-09-18 DIAGNOSIS — Z9181 History of falling: Secondary | ICD-10-CM | POA: Diagnosis not present

## 2019-09-18 DIAGNOSIS — Z20822 Contact with and (suspected) exposure to covid-19: Secondary | ICD-10-CM | POA: Diagnosis present

## 2019-09-18 LAB — BASIC METABOLIC PANEL
Anion gap: 9 (ref 5–15)
BUN: 16 mg/dL (ref 8–23)
CO2: 29 mmol/L (ref 22–32)
Calcium: 9.5 mg/dL (ref 8.9–10.3)
Chloride: 105 mmol/L (ref 98–111)
Creatinine, Ser: 1.34 mg/dL — ABNORMAL HIGH (ref 0.44–1.00)
GFR calc Af Amer: 41 mL/min — ABNORMAL LOW (ref >60)
GFR calc non Af Amer: 36 mL/min — ABNORMAL LOW (ref >60)
Glucose, Bld: 94 mg/dL (ref 70–99)
Potassium: 3.1 mmol/L — ABNORMAL LOW (ref 3.5–5.1)
Sodium: 143 mmol/L (ref 135–145)

## 2019-09-18 LAB — ECHOCARDIOGRAM COMPLETE
AR max vel: 1.22 cm2
AV Peak grad: 12.4 mmHg
Ao pk vel: 1.76 m/s
Area-P 1/2: 5.88 cm2
Height: 59 in
S' Lateral: 3.56 cm
Weight: 2795.2 oz

## 2019-09-18 LAB — LIPID PANEL
Cholesterol: 122 mg/dL (ref 0–200)
HDL: 34 mg/dL — ABNORMAL LOW (ref >40)
LDL Cholesterol: 70 mg/dL (ref 0–99)
Total CHOL/HDL Ratio: 3.6 RATIO
Triglycerides: 89 mg/dL (ref <150)
VLDL: 18 mg/dL (ref 0–40)

## 2019-09-18 LAB — GLUCOSE, CAPILLARY: Glucose-Capillary: 106 mg/dL — ABNORMAL HIGH (ref 70–99)

## 2019-09-18 LAB — HEMOGLOBIN A1C
Hgb A1c MFr Bld: 6.9 % — ABNORMAL HIGH (ref 4.8–5.6)
Mean Plasma Glucose: 151 mg/dL

## 2019-09-18 MED ORDER — FUROSEMIDE 40 MG PO TABS
80.0000 mg | ORAL_TABLET | Freq: Two times a day (BID) | ORAL | Status: DC
  Filled 2019-09-18: qty 2

## 2019-09-18 MED ORDER — METOPROLOL SUCCINATE ER 25 MG PO TB24: 12.5000 mg | ORAL_TABLET | Freq: Every day | ORAL | Status: DC

## 2019-09-18 MED ORDER — FUROSEMIDE 40 MG PO TABS: ORAL_TABLET | ORAL | 3 refills | Status: DC

## 2019-09-18 MED ORDER — FUROSEMIDE 10 MG/ML IJ SOLN
80.0000 mg | Freq: Once | INTRAMUSCULAR | Status: AC
  Administered 2019-09-18: 80 mg via INTRAVENOUS
  Filled 2019-09-18: qty 8

## 2019-09-18 MED ORDER — POTASSIUM CHLORIDE ER 20 MEQ PO TBCR: EXTENDED_RELEASE_TABLET | ORAL | 0 refills | Status: DC

## 2019-09-18 MED ORDER — METOPROLOL SUCCINATE ER 25 MG PO TB24
12.5000 mg | ORAL_TABLET | Freq: Every day | ORAL | Status: DC
  Administered 2019-09-18: 12.5 mg via ORAL
  Filled 2019-09-18 (×2): qty 1

## 2019-09-18 MED ORDER — AMIODARONE HCL 200 MG PO TABS
100.0000 mg | ORAL_TABLET | Freq: Every day | ORAL | Status: DC
  Administered 2019-09-18: 100 mg via ORAL
  Filled 2019-09-18: qty 1

## 2019-09-18 MED ORDER — FUROSEMIDE 40 MG PO TABS: 40.0000 mg | ORAL_TABLET | Freq: Two times a day (BID) | ORAL | Status: DC

## 2019-09-18 MED ORDER — POTASSIUM CHLORIDE CRYS ER 20 MEQ PO TBCR
40.0000 meq | EXTENDED_RELEASE_TABLET | Freq: Once | ORAL | Status: AC
  Administered 2019-09-18: 40 meq via ORAL
  Filled 2019-09-18: qty 2

## 2019-09-18 MED ORDER — METOPROLOL SUCCINATE ER 25 MG PO TB24: 12.5000 mg | ORAL_TABLET | Freq: Every day | ORAL | 0 refills | Status: DC

## 2019-09-18 NOTE — TOC Progression Note (Signed)
Transition of Care [facility]) - Progression Note    Patient Details  Name: Jaylee Freeze MRN: [id number] Date of Birth: Aug 16, 1929  Transition of Care [facility]) CM/SW Contact  Zigmund Daniel Dorian Pod, RN Phone [place] 09/18/2019, 4:27 PM  Clinical Narrative:     Plans were for discharge today however pt not stable for discharge as noted. Patient currently being diuresed with IV Lasix for acute diastolic congestive heart failure. RN met with pt and completed heart failure screening and education. Pt will complete understanding and aware to seek medical attention with any precipitating symptoms. Aware of the importance of daily weights and fluid retention if noted more this her usually swelling.   TOC will continue to follow.  Note pt may need transport voucher upon discharge.       Expected Discharge Plan and Services           Expected Discharge Date: 09/18/19                                     Social Determinants of Health (SDOH) Interventions    Readmission Risk Interventions No flowsheet data found.

## 2019-09-18 NOTE — Progress Notes (Signed)
Patient ID: Evelyn Simmons, female   DOB: 12-30-1929, 86 y.o.   MRN: [id number] [facility] PROGRESS NOTE  Evelyn Simmons WUJ:[id number] DOB: 07-24-1929 DOA: 09/17/2019 PCP: Leticia Penna, MD  HPI/Subjective: Patient states that she was feeling better and wanted to go home.  She was lying flat when I saw her.  She changed her mind this afternoon after cardiology ordered another dose of IV Lasix.  Objective: Vitals:   09/18/19 0730 09/18/19 1143  BP: (!) 121/59 93/65  Pulse: 86 (!) 102  Resp: 19 18  Temp: 98.4 F (36.9 C) 98.6 F (37 C)  SpO2: 90% 98%    Intake/Output Summary (Last 24 hours) at 09/18/2019 1432 Last data filed at 09/18/2019 0800 Gross per 24 hour  Intake 598.06 ml  Output 250 ml  Net 348.06 ml   Filed Weights   09/17/19 1050 09/17/19 1529 09/18/19 0423  Weight: 79.4 kg 79.2 kg 79.2 kg    ROS: Review of Systems  Respiratory: Negative for shortness of breath.   Cardiovascular: Negative for chest pain.  Gastrointestinal: Negative for abdominal pain.   Exam: Physical Exam HENT:     Nose: No mucosal edema.     Mouth/Throat:     Pharynx: No oropharyngeal exudate.  Eyes:     General: Lids are normal.     Conjunctiva/sclera: Conjunctivae normal.     Pupils: Pupils are equal, round, and reactive to light.  Cardiovascular:     Rate and Rhythm: Tachycardia present. Rhythm irregularly irregular.     Heart sounds: Normal heart sounds, S1 normal and S2 normal.  Pulmonary:     Breath sounds: Examination of the right-lower field reveals decreased breath sounds. Examination of the left-lower field reveals decreased breath sounds. Decreased breath sounds present. No wheezing, rhonchi or rales.  Abdominal:     Palpations: Abdomen is soft.     Tenderness: There is no abdominal tenderness.  Musculoskeletal:     Right lower leg: Swelling present.     Left lower leg: Swelling present.  Skin:    General: Skin is warm.     Findings: No rash.  Neurological:     Mental  Status: She is alert and oriented to person, place, and time.       Data Reviewed: Basic Metabolic Panel: Recent Labs  Lab 09/17/19 1055 09/18/19 0406  NA 140 143  K 3.7 3.1*  CL 106 105  CO2 23 29  GLUCOSE 195* 94  BUN 19 16  CREATININE 1.58* 1.34*  CALCIUM 9.8 9.5   CBC: Recent Labs  Lab 09/17/19 1055  WBC 6.4  HGB 11.0*  HCT 34.2*  MCV 93.4  PLT 316   BNP (last 3 results) Recent Labs    09/17/19 1344  BNP 175.3*     CBG: Recent Labs  Lab 09/18/19 0814  GLUCAP 106*    Recent Results (from the past 240 hour(s))  SARS Coronavirus 2 by RT PCR (hospital order, performed in [facility] hospital lab) Nasopharyngeal Nasopharyngeal Swab     Status: None   Collection Time: 09/17/19 10:55 AM   Specimen: Nasopharyngeal Swab  Result Value Ref Range Status   SARS Coronavirus 2 NEGATIVE NEGATIVE Final    Comment: (NOTE) SARS-CoV-2 target nucleic acids are NOT DETECTED.  The SARS-CoV-2 RNA is generally detectable in upper and lower respiratory specimens during the acute phase of infection. The lowest concentration of SARS-CoV-2 viral copies this assay can detect is 250 copies / mL. A negative result does not preclude SARS-CoV-2  infection and should not be used as the sole basis for treatment or other patient management decisions.  A negative result may occur with improper specimen collection / handling, submission of specimen other than nasopharyngeal swab, presence of viral mutation(s) within the areas targeted by this assay, and inadequate number of viral copies (<250 copies / mL). A negative result must be combined with clinical observations, patient history, and epidemiological information.  Fact Sheet for Patients:   StrictlyIdeas.no  Fact Sheet for Healthcare Providers: BankingDealers.co.za  This test is not yet approved or  cleared by the [place] FDA and has been authorized for detection and/or  diagnosis of SARS-CoV-2 by FDA under an Emergency Use Authorization (EUA).  This EUA will remain in effect (meaning this test can be used) for the duration of the COVID-19 declaration under Section 564(b)(1) of the Act, 21 U.S.C. section 360bbb-3(b)(1), unless the authorization is terminated or revoked sooner.  Performed at [facility], [address] [place], [place] [address]      Studies: DG Chest 2 View  Result Date: 09/17/2019 CLINICAL DATA:  Shortness of breath EXAM: CHEST - 2 VIEW COMPARISON:  None. FINDINGS: There is cardiomegaly with a degree of pulmonary venous hypertension. There is mild interstitial edema bilaterally. There is a small left pleural effusion. There is no appreciable airspace consolidation. There is aortic atherosclerosis. Pacemaker leads are attached to the right atrium and right ventricle. No adenopathy. No bone lesions. IMPRESSION: Cardiomegaly with pulmonary vascular congestion. Pacemaker leads attached to right atrium and right ventricle. There is a small left pleural effusion with mild interstitial edema. The appearance is felt to be indicative of a degree of congestive heart failure. No consolidation. Aortic Atherosclerosis (ICD10-I70.0). Electronically Signed   By: Lowella Grip III M.D.   On: 09/17/2019 11:27   ECHOCARDIOGRAM COMPLETE  Result Date: 09/18/2019    ECHOCARDIOGRAM REPORT   Patient Name:   Evelyn Simmons Date of Exam: 09/18/2019 Medical Rec #:  [id number]     Height:       59.0 in Accession #:    [id number]    Weight:       174.7 lb Date of Birth:  1929/12/12      BSA:          1.741 m Patient Age:    70 years      BP:           121/59 mmHg Patient Gender: F             HR:           86 bpm. Exam Location:  [facility] Procedure: 2D Echo, Cardiac Doppler and Color Doppler Indications:     CHF-Acute Diastolic 322.02 / R42.70  History:         Patient has no prior history of Echocardiogram examinations.                  CAD; Arrythmias:Atrial  Fibrillation. MI.  Sonographer:     Alyse Low Roar Referring Phys:  [place] Diagnosing Phys: Skeet Latch MD IMPRESSIONS  1. Left ventricular ejection fraction, by estimation, is 45 to 50%. The left ventricle has mildly decreased function. The left ventricle demonstrates global hypokinesis. There is mild concentric left ventricular hypertrophy. Left ventricular diastolic parameters are indeterminate. Elevated left ventricular end-diastolic pressure.  2. Right ventricular systolic function is mildly reduced. The right ventricular size is normal. There is mildly elevated pulmonary artery systolic pressure.  3. Left atrial size was severely  dilated.  4. Right atrial size was mildly dilated.  5. The mitral valve is normal in structure. Mild mitral valve regurgitation. No evidence of mitral stenosis.  6. The aortic valve is tricuspid. Aortic valve regurgitation is not visualized. No aortic stenosis is present.  7. The inferior vena cava is normal in size with <50% respiratory variability, suggesting right atrial pressure of 8 mmHg. FINDINGS  Left Ventricle: Left ventricular ejection fraction, by estimation, is 45 to 50%. The left ventricle has mildly decreased function. The left ventricle demonstrates global hypokinesis. The left ventricular internal cavity size was normal in size. There is  mild concentric left ventricular hypertrophy. Left ventricular diastolic parameters are indeterminate. Elevated left ventricular end-diastolic pressure. Right Ventricle: The right ventricular size is normal. No increase in right ventricular wall thickness. Right ventricular systolic function is mildly reduced. There is mildly elevated pulmonary artery systolic pressure. The tricuspid regurgitant velocity  is 2.92 m/s, and with an assumed right atrial pressure of 8 mmHg, the estimated right ventricular systolic pressure is 29.5 mmHg. Left Atrium: Left atrial size was severely dilated. Right Atrium: Right atrial size  was mildly dilated. Pericardium: Trivial pericardial effusion is present. Mitral Valve: The mitral valve is normal in structure. There is moderate thickening of the mitral valve leaflet(s). There is moderate calcification of the mitral valve leaflet(s). Normal mobility of the mitral valve leaflets. Severe mitral annular calcification. Mild mitral valve regurgitation, with posteriorly-directed jet. No evidence of mitral valve stenosis. Tricuspid Valve: The tricuspid valve is normal in structure. Tricuspid valve regurgitation is mild . No evidence of tricuspid stenosis. Aortic Valve: The aortic valve is tricuspid. . There is mild thickening and mild calcification of the aortic valve. Aortic valve regurgitation is not visualized. No aortic stenosis is present. There is mild thickening of the aortic valve. There is mild calcification of the aortic valve. Aortic valve peak gradient measures 12.4 mmHg. Pulmonic Valve: The pulmonic valve was normal in structure. Pulmonic valve regurgitation is not visualized. No evidence of pulmonic stenosis. Aorta: The aortic root is normal in size and structure. Venous: The inferior vena cava is normal in size with less than 50% respiratory variability, suggesting right atrial pressure of 8 mmHg. IAS/Shunts: No atrial level shunt detected by color flow Doppler. Additional Comments: A pacer wire is visualized.  LEFT VENTRICLE PLAX 2D LVIDd:         4.74 cm  Diastology LVIDs:         3.56 cm  LV e' lateral:   7.83 cm/s LV PW:         1.17 cm  LV E/e' lateral: 20.2 LV IVS:        1.26 cm  LV e' medial:    7.29 cm/s LVOT diam:     1.90 cm  LV E/e' medial:  21.7 LVOT Area:     2.84 cm  RIGHT VENTRICLE RV Mid diam:    2.61 cm RV S prime:     9.90 cm/s TAPSE (M-mode): 1.2 cm LEFT ATRIUM              Index       RIGHT ATRIUM           Index LA diam:        4.90 cm  2.81 cm/m  RA Area:     22.50 cm LA Vol (A2C):   147.0 ml 84.42 ml/m RA Volume:   54.40 ml  31.24 ml/m LA Vol (A4C):   115.0 ml  66.04  ml/m LA Biplane Vol: 130.0 ml 74.66 ml/m  AORTIC VALVE                PULMONIC VALVE AV Area (Vmax): 1.22 cm    PV Vmax:        1.10 m/s AV Vmax:        176.00 cm/s PV Peak grad:   4.8 mmHg AV Peak Grad:   12.4 mmHg   RVOT Peak grad: 2 mmHg LVOT Vmax:      75.60 cm/s  AORTA Ao Root diam: 2.50 cm MITRAL VALVE                TRICUSPID VALVE MV Area (PHT): 5.88 cm     TR Peak grad:   34.1 mmHg MV Decel Time: 129 msec     TR Vmax:        292.00 cm/s MV E velocity: 158.00 cm/s                             SHUNTS                             Systemic Diam: 1.90 cm Skeet Latch MD Electronically signed by Skeet Latch MD Signature Date/Time: 09/18/2019/12:24:31 PM    Final     Scheduled Meds: . amiodarone  100 mg Oral Daily  . aspirin EC  81 mg Oral Daily  . cholecalciferol  1,000 Units Oral Daily  . DULoxetine  30 mg Oral Daily  . enoxaparin (LOVENOX) injection  30 mg Subcutaneous Q24H  . furosemide  40 mg Intravenous Daily  . furosemide  80 mg Intravenous Once  . [START ON 09/21/2019] furosemide  40 mg Oral BID  . [START ON 09/19/2019] furosemide  80 mg Oral BID  . insulin aspart  0-15 Units Subcutaneous TID WC  . metoprolol succinate  12.5 mg Oral Daily  . pantoprazole  20 mg Oral Daily  . rosuvastatin  40 mg Oral Daily  . senna-docusate  2 tablet Oral QHS  . sodium chloride flush  3 mL Intravenous Once  . sodium chloride flush  3 mL Intravenous Q12H  . traZODone  100 mg Oral QHS   Continuous Infusions: . sodium chloride      Assessment/Plan:  1. Persistent rapid atrial fibrillation.  Cardiology put back on oral amiodarone and changed beta-blocker over to Toprol-XL 12.5 mg daily.  No anticoagulation secondary to fall risk and bleeding risk. 2. Acute on chronic diastolic congestive heart failure.  Cardiology wrote for 80 mg of Lasix IV once now and 80 mg twice a day for 2 days then 40 mg twice a day after that.  Patient on Toprol-XL.  Blood pressure too low for any other  medications at this point. 3. 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  Watch closely with diuresis. 4. Hyperlipidemia on Crestor     Code Status:     Code Status Orders  (From admission, onward)         Start     Ordered   09/17/19 1425  Do not attempt resuscitation (DNR)  Continuous       Question Answer Comment  In the event of cardiac or respiratory ARREST Do not call a "code blue"   In the event of cardiac or respiratory ARREST Do not perform Intubation, CPR, defibrillation or ACLS   In the event of cardiac  or respiratory ARREST Use medication by any route, position, wound care, and other measures to relive pain and suffering. May use oxygen, suction and manual treatment of airway obstruction as needed for comfort.   Comments Discussed CODE STATUS with patient who wants to be placed in a DO NOT RESUSCITATE status      09/17/19 1426        Code Status History    This patient has a current code status but no historical code status.   Advance Care Planning Activity    Advance Directive Documentation     Most Recent Value  Type of Advance Directive Healthcare Power of Attorney, Living will, Out of facility DNR (pink MOST or yellow form)  Pre-existing out of facility DNR order (yellow form or pink MOST form) --  "MOST" Form in Place? --     Family Communication: Left message for daughter Disposition Plan: Status is: Inpatient   Dispo: The patient is from: Home              Anticipated d/c is to: Home              Anticipated d/c date is: Patient wanted to go home today but then changed her mind.  Anticipate going home tomorrow 09/19/2019              Patient currently being diuresed with IV Lasix for acute diastolic congestive heart failure.  Consultants:  Cardiology  Time spent: 38 minutes.  Speaking with cardiology, nursing staff and patient on 3 different occasions trying to coordinate care today.  [place]  [facility]  [employer]

## 2019-09-18 NOTE — Plan of Care (Signed)
  Problem: Clinical Measurements: Goal: Cardiovascular complication will be avoided Outcome: Progressing  Pt is taking PO metop Problem: Activity: Goal: Risk for activity intolerance will decrease Outcome: Progressing

## 2019-09-18 NOTE — Progress Notes (Signed)
*  PRELIMINARY RESULTS* Echocardiogram 2D Echocardiogram has been performed.  Evelyn Simmons C Talon Regala 09/18/2019, 11:59 AM

## 2019-09-18 NOTE — Progress Notes (Signed)
Progress Note  Patient Name: Evelyn Simmons Date of Encounter: 09/18/2019  [facility] Cardiologist: Virl Axe, MD   Subjective   Feeling well.  Reports that her breathing is back to baseline.  At home she does not typically use any oxygen in the summer.  She uses 2 L sometimes with exertion or in the winter.  Inpatient Medications    Scheduled Meds: . aspirin EC  81 mg Oral Daily  . cholecalciferol  1,000 Units Oral Daily  . DULoxetine  30 mg Oral Daily  . enoxaparin (LOVENOX) injection  30 mg Subcutaneous Q24H  . furosemide  40 mg Intravenous Daily  . insulin aspart  0-15 Units Subcutaneous TID WC  . metoprolol tartrate  12.5 mg Oral Q6H  . pantoprazole  20 mg Oral Daily  . rosuvastatin  40 mg Oral Daily  . senna-docusate  2 tablet Oral QHS  . sodium chloride flush  3 mL Intravenous Once  . sodium chloride flush  3 mL Intravenous Q12H  . traZODone  100 mg Oral QHS   Continuous Infusions: . sodium chloride     PRN Meds: sodium chloride, acetaminophen, cyclobenzaprine, HYDROcodone-acetaminophen, ipratropium-albuterol, ondansetron (ZOFRAN) IV, sodium chloride flush   Vital Signs    Vitals:   09/18/19 0423 09/18/19 0425 09/18/19 0730 09/18/19 1143  BP: 94/62 (!) 111/55 (!) 121/59 93/65  Pulse: 78 83 86 (!) 102  Resp:   19 18  Temp: 98.2 F (36.8 C)  98.4 F (36.9 C) 98.6 F (37 C)  TempSrc: Oral  Oral Oral  SpO2: 92% 98% 90% 98%  Weight: 79.2 kg     Height:        Intake/Output Summary (Last 24 hours) at 09/18/2019 1229 Last data filed at 09/18/2019 0800 Gross per 24 hour  Intake 598.06 ml  Output 250 ml  Net 348.06 ml   Last 3 Weights 09/18/2019 09/17/2019 09/17/2019  Weight (lbs) 174 lb 11.2 oz 174 lb 9.7 oz 175 lb  Weight (kg) 79.243 kg 79.2 kg 79.379 kg      Telemetry    Atrial fibrillation.  Rates 80s to 120s.  Mostly less than 100 bpm.- Personally Reviewed  ECG    09/17/2019: Atrial fibrillation.  Rate 114 bpm.  Prior anterior infarct.  Incomplete  left bundle branch block. - Personally Reviewed  Physical Exam   VS:  BP 93/65 (BP Location: Left Arm)   Pulse (!) 102   Temp 98.6 F (37 C) (Oral)   Resp 18   Ht 4\' 11"  (1.499 m)   Wt 79.2 kg   SpO2 98%   BMI 35.29 kg/m  , BMI Body mass index is 35.29 kg/m. GENERAL:  Well appearing.  Eating lunch in no acute distress HEENT: Pupils equal round and reactive, fundi not visualized, oral mucosa unremarkable NECK: JVP 1cm above clavicle at 45 degrees waveform within normal limits, carotid upstroke brisk and symmetric, no bruits, no thyromegaly LUNGS:  Clear to auscultation bilaterally HEART:  Irregularly irregular.  PMI not displaced or sustained,S1 and S2 within normal limits, no S3, no S4, no clicks, no rubs, no murmurs ABD:  Flat, positive bowel sounds normal in frequency in pitch, no bruits, no rebound, no guarding, no midline pulsatile mass, no hepatomegaly, no splenomegaly EXT:  2 plus pulses throughout, trace edema, no cyanosis no clubbing SKIN:  No rashes no nodules NEURO:  Cranial nerves II through XII grossly intact, motor grossly intact throughout PSYCH:  Cognitively intact, oriented to person place and time  Labs    High Sensitivity Troponin:   Recent Labs  Lab 09/17/19 1055 09/17/19 1539  TROPONINIHS 19* 21*      Chemistry Recent Labs  Lab 09/17/19 1055 09/18/19 0406  NA 140 143  K 3.7 3.1*  CL 106 105  CO2 23 29  GLUCOSE 195* 94  BUN 19 16  CREATININE 1.58* 1.34*  CALCIUM 9.8 9.5  GFRNONAA 29* 36*  GFRAA 34* 41*  ANIONGAP 11 9     Hematology Recent Labs  Lab 09/17/19 1055  WBC 6.4  RBC 3.66*  HGB 11.0*  HCT 34.2*  MCV 93.4  MCH 30.1  MCHC 32.2  RDW 14.9  PLT 316    BNP Recent Labs  Lab 09/17/19 1344  BNP 175.3*     DDimer No results for input(s): DDIMER in the last 168 hours.   Radiology    DG Chest 2 View  Result Date: 09/17/2019 CLINICAL DATA:  Shortness of breath EXAM: CHEST - 2 VIEW COMPARISON:  None. FINDINGS: There is  cardiomegaly with a degree of pulmonary venous hypertension. There is mild interstitial edema bilaterally. There is a small left pleural effusion. There is no appreciable airspace consolidation. There is aortic atherosclerosis. Pacemaker leads are attached to the right atrium and right ventricle. No adenopathy. No bone lesions. IMPRESSION: Cardiomegaly with pulmonary vascular congestion. Pacemaker leads attached to right atrium and right ventricle. There is a small left pleural effusion with mild interstitial edema. The appearance is felt to be indicative of a degree of congestive heart failure. No consolidation. Aortic Atherosclerosis (ICD10-I70.0). Electronically Signed   By: Lowella Grip III M.D.   On: 09/17/2019 11:27   ECHOCARDIOGRAM COMPLETE  Result Date: 09/18/2019    ECHOCARDIOGRAM REPORT   Patient Name:   Evelyn Simmons Date of Exam: 09/18/2019 Medical Rec #:  [id number]     Height:       59.0 in Accession #:    [id number]    Weight:       174.7 lb Date of Birth:  07-02-1929      BSA:          1.741 m Patient Age:    86 years      BP:           121/59 mmHg Patient Gender: F             HR:           86 bpm. Exam Location:  [facility] Procedure: 2D Echo, Cardiac Doppler and Color Doppler Indications:     CHF-Acute Diastolic 657.84 / O96.29  History:         Patient has no prior history of Echocardiogram examinations.                  CAD; Arrythmias:Atrial Fibrillation. MI.  Sonographer:     Alyse Low Roar Referring Phys:  [place] Diagnosing Phys: Skeet Latch MD IMPRESSIONS  1. Left ventricular ejection fraction, by estimation, is 45 to 50%. The left ventricle has mildly decreased function. The left ventricle demonstrates global hypokinesis. There is mild concentric left ventricular hypertrophy. Left ventricular diastolic parameters are indeterminate. Elevated left ventricular end-diastolic pressure.  2. Right ventricular systolic function is mildly reduced. The right ventricular size is  normal. There is mildly elevated pulmonary artery systolic pressure.  3. Left atrial size was severely dilated.  4. Right atrial size was mildly dilated.  5. The mitral valve is normal in structure. Mild mitral valve regurgitation. No evidence of  mitral stenosis.  6. The aortic valve is tricuspid. Aortic valve regurgitation is not visualized. No aortic stenosis is present.  7. The inferior vena cava is normal in size with <50% respiratory variability, suggesting right atrial pressure of 8 mmHg. FINDINGS  Left Ventricle: Left ventricular ejection fraction, by estimation, is 45 to 50%. The left ventricle has mildly decreased function. The left ventricle demonstrates global hypokinesis. The left ventricular internal cavity size was normal in size. There is  mild concentric left ventricular hypertrophy. Left ventricular diastolic parameters are indeterminate. Elevated left ventricular end-diastolic pressure. Right Ventricle: The right ventricular size is normal. No increase in right ventricular wall thickness. Right ventricular systolic function is mildly reduced. There is mildly elevated pulmonary artery systolic pressure. The tricuspid regurgitant velocity  is 2.92 m/s, and with an assumed right atrial pressure of 8 mmHg, the estimated right ventricular systolic pressure is 29.5 mmHg. Left Atrium: Left atrial size was severely dilated. Right Atrium: Right atrial size was mildly dilated. Pericardium: Trivial pericardial effusion is present. Mitral Valve: The mitral valve is normal in structure. There is moderate thickening of the mitral valve leaflet(s). There is moderate calcification of the mitral valve leaflet(s). Normal mobility of the mitral valve leaflets. Severe mitral annular calcification. Mild mitral valve regurgitation, with posteriorly-directed jet. No evidence of mitral valve stenosis. Tricuspid Valve: The tricuspid valve is normal in structure. Tricuspid valve regurgitation is mild . No evidence of  tricuspid stenosis. Aortic Valve: The aortic valve is tricuspid. . There is mild thickening and mild calcification of the aortic valve. Aortic valve regurgitation is not visualized. No aortic stenosis is present. There is mild thickening of the aortic valve. There is mild calcification of the aortic valve. Aortic valve peak gradient measures 12.4 mmHg. Pulmonic Valve: The pulmonic valve was normal in structure. Pulmonic valve regurgitation is not visualized. No evidence of pulmonic stenosis. Aorta: The aortic root is normal in size and structure. Venous: The inferior vena cava is normal in size with less than 50% respiratory variability, suggesting right atrial pressure of 8 mmHg. IAS/Shunts: No atrial level shunt detected by color flow Doppler. Additional Comments: A pacer wire is visualized.  LEFT VENTRICLE PLAX 2D LVIDd:         4.74 cm  Diastology LVIDs:         3.56 cm  LV e' lateral:   7.83 cm/s LV PW:         1.17 cm  LV E/e' lateral: 20.2 LV IVS:        1.26 cm  LV e' medial:    7.29 cm/s LVOT diam:     1.90 cm  LV E/e' medial:  21.7 LVOT Area:     2.84 cm  RIGHT VENTRICLE RV Mid diam:    2.61 cm RV S prime:     9.90 cm/s TAPSE (M-mode): 1.2 cm LEFT ATRIUM              Index       RIGHT ATRIUM           Index LA diam:        4.90 cm  2.81 cm/m  RA Area:     22.50 cm LA Vol (A2C):   147.0 ml 84.42 ml/m RA Volume:   54.40 ml  31.24 ml/m LA Vol (A4C):   115.0 ml 66.04 ml/m LA Biplane Vol: 130.0 ml 74.66 ml/m  AORTIC VALVE  PULMONIC VALVE AV Area (Vmax): 1.22 cm    PV Vmax:        1.10 m/s AV Vmax:        176.00 cm/s PV Peak grad:   4.8 mmHg AV Peak Grad:   12.4 mmHg   RVOT Peak grad: 2 mmHg LVOT Vmax:      75.60 cm/s  AORTA Ao Root diam: 2.50 cm MITRAL VALVE                TRICUSPID VALVE MV Area (PHT): 5.88 cm     TR Peak grad:   34.1 mmHg MV Decel Time: 129 msec     TR Vmax:        292.00 cm/s MV E velocity: 158.00 cm/s                             SHUNTS                              Systemic Diam: 1.90 cm Skeet Latch MD Electronically signed by Skeet Latch MD Signature Date/Time: 09/18/2019/12:24:31 PM    Final     Cardiac Studies   Echo 09/18/19: IMPRESSIONS  1. Left ventricular ejection fraction, by estimation, is 45 to 50%. The  left ventricle has mildly decreased function. The left ventricle  demonstrates global hypokinesis. There is mild concentric left ventricular  hypertrophy. Left ventricular diastolic  parameters are indeterminate. Elevated left ventricular end-diastolic  pressure.  2. Right ventricular systolic function is mildly reduced. The right  ventricular size is normal. There is mildly elevated pulmonary artery  systolic pressure.  3. Left atrial size was severely dilated.  4. Right atrial size was mildly dilated.  5. The mitral valve is normal in structure. Mild mitral valve  regurgitation. No evidence of mitral stenosis.  6. The aortic valve is tricuspid. Aortic valve regurgitation is not  visualized. No aortic stenosis is present.  7. The inferior vena cava is normal in size with <50% respiratory  variability, suggesting right atrial pressure of 8 mmHg.   Patient Profile     86 y.o. female with CAD s/p LAD and RCA PCI, SSS s/p PPM, persistent atrial fibrillation not on anticoagulation, prior GI bleed, hypertension, hyperlipidemia, diabetes, anemia, and COPD on home O2 admitted with atrial fibrillation rapid ventricular response.  Assessment & Plan    # Persistent atrial fibrillation:  She was admitted with shortness of breath and palpitations and found to be in atrial fibrillation with rapid ventricular response.  She started on IV amiodarone and diuresed with IV Lasix.  She is now off IV amiodarone.  Resume her home dose of 100 mg daily.  She was started on metoprolol tartrate but did not receive her evening dose due to low blood pressure.  We will switch this to metoprolol succinate 12.5 mg daily.  Heart rates only went into the  low 100s with ambulation.  Continue aspirin.  She is not on oral anticoagulation due to prior GI bleeds.  # Acute on chronic diastolic heart failure: She was admitted with acute on chronic diastolic heart failure and atrial fibrillation with rapid ventricular response.  Urinary output has not been well-recorded.  Her weight is essentially unchanged from yesterday and she has not really diuresed.  She remains on supplemental oxygen at 3 L and typically does not need to use any at home.  If she does use  it it typically is only 2 L.  I think she is still mildly volume overloaded.  Renal function is slightly improved from yesterday at 1.34 down from 1.58.  There are no recent labs available, though her last creatinine 5 years ago was around 1.1. Echo this admission reveals LVEF 45-50%, unchanged from TEE in 2013.  We will increase Lasix to 80 mg IV x1.  Thus far she is only -250 mL with her 40 mg dose this morning.  If she is still here tomorrow we will need to reassess her volume status.  If she decides to leave we will put her on 80 mg p.o. twice daily for 2 days followed by resuming her 40 mg p.o. twice daily home dose.  # CAD s/p PCI: # Pure hypercholesterolemia: Not an active issue.  Continue aspirin and add metoprolol as above.  Continue rosuvastatin.  LDL 70.  # Essential hypertension: Blood pressure has been running low.  Her home amlodipine is on hold.  Diuresing as above.  We have added metoprolol as above.  # Dispo:  Ms. Saulter would really like to go home today because her [birth year] is tomorrow.  We discussed the fact that it would be better for her to stay 1 more day and watch her blood pressure and renal function as we try to diurese her.  If she is adamant about leaving then we would plan to start her on Lasix 80 mg twice daily for 2 days on 8/8 and 8/9.  She would then go back to 40 mg twice daily.  She will need close follow-up with cardiology or the atrial fibrillation clinic.  We will  arrange this.      For questions or updates, please contact [place] Please consult [email or web address] for contact info under        Signed, Skeet Latch, MD  09/18/2019, 12:29 PM

## 2019-09-19 DIAGNOSIS — J9611 Chronic respiratory failure with hypoxia: Secondary | ICD-10-CM

## 2019-09-19 LAB — BASIC METABOLIC PANEL
Anion gap: 10 (ref 5–15)
BUN: 20 mg/dL (ref 8–23)
CO2: 29 mmol/L (ref 22–32)
Calcium: 9.4 mg/dL (ref 8.9–10.3)
Chloride: 102 mmol/L (ref 98–111)
Creatinine, Ser: 1.39 mg/dL — ABNORMAL HIGH (ref 0.44–1.00)
GFR calc Af Amer: 39 mL/min — ABNORMAL LOW (ref >60)
GFR calc non Af Amer: 34 mL/min — ABNORMAL LOW (ref >60)
Glucose, Bld: 129 mg/dL — ABNORMAL HIGH (ref 70–99)
Potassium: 3.6 mmol/L (ref 3.5–5.1)
Sodium: 141 mmol/L (ref 135–145)

## 2019-09-19 LAB — GLUCOSE, CAPILLARY: Glucose-Capillary: 136 mg/dL — ABNORMAL HIGH (ref 70–99)

## 2019-09-19 MED ORDER — POTASSIUM CHLORIDE ER 20 MEQ PO TBCR: EXTENDED_RELEASE_TABLET | ORAL | 0 refills | Status: DC

## 2019-09-19 MED ORDER — METOPROLOL SUCCINATE ER 25 MG PO TB24: 12.5000 mg | ORAL_TABLET | Freq: Every day | ORAL | 0 refills | Status: DC

## 2019-09-19 MED ORDER — FUROSEMIDE 40 MG PO TABS: ORAL_TABLET | ORAL | 3 refills | Status: DC

## 2019-09-19 NOTE — Progress Notes (Signed)
Patient is alert and oriented resperation even and unlabored denies any complaint of pain or needs at this time  Son here to pick up patient

## 2019-09-19 NOTE — Discharge Summary (Signed)
[facility] NAME: Evelyn Simmons    MR#:  [id number]  DATE OF BIRTH:  [birth year]  DATE OF ADMISSION:  09/17/2019 ADMITTING PHYSICIAN: Loletha Grayer, MD  DATE OF DISCHARGE: 09/19/2019 11:28 AM  PRIMARY CARE PHYSICIAN: [facility]   ADMISSION DIAGNOSIS:  Atrial fibrillation with rapid ventricular response (HCC) [C94.70] Acute diastolic CHF (congestive heart failure) (HCC) [I50.31] Acute on chronic respiratory failure with hypoxia (HCC) [J96.21] Acute on chronic congestive heart failure, unspecified heart failure type (HCC) [I50.9] CHF (congestive heart failure) ([place]) [I50.9]  DISCHARGE DIAGNOSIS:  Principal Problem:   Acute diastolic CHF (congestive heart failure) (HCC) Active Problems:   CKD stage 3 due to type 2 diabetes mellitus (HCC)   Atrial fibrillation with rapid ventricular response (HCC)   CHF (congestive heart failure) ([place])   SECONDARY DIAGNOSIS:   Past Medical History:  Diagnosis Date  . Arthritis   . Asthma   . CAD (coronary artery disease)   . COPD (chronic obstructive pulmonary disease) ([place])   . Depression   . Diabetes mellitus without complication ([place])   . Diastolic heart failure ([place])   . Diverticulitis   . GERD (gastroesophageal reflux disease)   . Heart disease   . Hyperlipidemia   . Hypertension   . Hypothyroid    pt unsure, not currently on medication  . MI (myocardial infarction) ([place])    1995  . Persistent atrial fibrillation ([place])   . Radiculopathy     HOSPITAL COURSE:   1.  Rapid atrial fibrillation on presentation.  Cardiology put back on oral amiodarone and we added Toprol-XL 12.5 mg daily.  No anticoagulation secondary to fall risk and bleeding risk.  Heart rate 101 upon discharge. 2.  Acute on chronic diastolic congestive heart failure.  Cardiology wrote for Lasix 80 mg twice a day for 2 days will give it for one more day upon discharge.  Continue Toprol-XL.  Blood pressure too low  for any other heart failure medications. 3.  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  GFR thirty-four.  Continue diabetic medications. 4.  Hyperlipidemia on Crestor. 5.  Chronic respiratory failure on oxygen 2 L chronically  DISCHARGE CONDITIONS:   Satisfactory  CONSULTS OBTAINED:  Treatment Team:  Nelva Bush, MD  DRUG ALLERGIES:   Allergies  Allergen Reactions  . Other Other (See Comments)  . Sulfa Antibiotics     Pt unsure reaction, was told in hospital she had allergy  . Penicillin G Rash  . Penicillins Nausea And Vomiting and Rash    DISCHARGE MEDICATIONS:   Allergies as of 09/19/2019      Reactions   Other Other (See Comments)   Sulfa Antibiotics    Pt unsure reaction, was told in hospital she had allergy   Penicillin G Rash   Penicillins Nausea And Vomiting, Rash      Medication List    STOP taking these medications   amLODipine 2.5 MG tablet Commonly known as: NORVASC     TAKE these medications   Accu-Chek Nano SmartView w/Device Kit Please issue nano meter by AccuChek along with test strips at checking frequency of 2x daily. 90 day supply. E11.9   albuterol 108 (90 Base) MCG/ACT inhaler Commonly known as: VENTOLIN HFA Inhale 2 puffs into the lungs every 6 (six) hours as needed for wheezing or shortness of breath.   amiodarone 100 MG tablet Commonly known as: PACERONE Take 1 tablet (100 mg total) by mouth  daily.   aspirin EC 81 MG tablet Take 81 mg by mouth daily.   cholecalciferol 1000 units tablet Commonly known as: VITAMIN D Take 1,000 Units by mouth daily.   cyclobenzaprine 5 MG tablet Commonly known as: FLEXERIL Take 5 mg by mouth 3 (three) times daily as needed for muscle spasms.   DULoxetine 30 MG capsule Commonly known as: CYMBALTA Take 30 mg by mouth daily.   freestyle lancets Use as instructed   furosemide 40 MG tablet Commonly known as: LASIX Two tabs twice a day for one day then one tablet twice  a day afterwards What changed:   how much to take  how to take this  when to take this  additional instructions   glucose blood test strip Commonly known as: FREESTYLE LITE Use as instructed   HYDROcodone-acetaminophen 10-325 MG tablet Commonly known as: NORCO Take 1 tablet by mouth every 4 (four) hours as needed.   ipratropium-albuterol 0.5-2.5 (3) MG/3ML Soln Commonly known as: DUONEB Take 3 mLs by nebulization.   metoprolol succinate 25 MG 24 hr tablet Commonly known as: TOPROL-XL Take 0.5 tablets (12.5 mg total) by mouth daily.   pantoprazole 20 MG tablet Commonly known as: PROTONIX Take 20 mg by mouth daily.   Potassium Chloride ER 20 MEQ Tbcr One tablet twice a day for one day then one tablet daily afterwards   rosuvastatin 40 MG tablet Commonly known as: CRESTOR Take 1 tablet (40 mg total) by mouth daily.   SENEXON-S PO Take by mouth. Take two tablets by mouth once daily   traZODone 100 MG tablet Commonly known as: DESYREL Take 100 mg by mouth at bedtime.   Victoza 18 MG/3ML Sopn Generic drug: liraglutide Inject into the skin.   VITRON-C PO Take by mouth. Take one tablet by mouth twice daily on Monday/ Wednesday/ Friday        DISCHARGE INSTRUCTIONS:   [facility].  If you experience worsening of your admission symptoms, develop shortness of breath, life threatening emergency, suicidal or homicidal thoughts you must seek medical attention immediately by calling 911 or calling your MD immediately  if symptoms less severe.  You Must read complete instructions/literature along with all the possible adverse reactions/side effects for all the Medicines you take and that have been prescribed to you. Take any new Medicines after you have completely understood and accept all the possible adverse reactions/side effects.   Please note  You were cared for by a hospitalist during your hospital stay. If you have any questions  about your discharge medications or the care you received while you were in the hospital after you are discharged, you can call the unit and asked to speak with the hospitalist on call if the hospitalist that took care of you is not available. Once you are discharged, your primary care physician will handle any further medical issues. Please note that NO REFILLS for any discharge medications will be authorized once you are discharged, as it is imperative that you return to your primary care physician (or establish a relationship with a primary care physician if you do not have one) for your aftercare needs so that they can reassess your need for medications and monitor your lab values.    Today   CHIEF COMPLAINT:   Chief Complaint  Patient presents with  . Shortness of Breath    afib RVR    HISTORY OF PRESENT ILLNESS:  Evelyn Simmons  is a 86 y.o. female  patient came in with shortness of breath found to be in rapid atrial fibrillation and heart failure.   VITAL SIGNS:  Blood pressure 115/67, pulse (!) 101, temperature 98.3 F (36.8 C), temperature source Oral, resp. rate 18, height 4' 11" (1.499 m), weight 79.6 kg, SpO2 93 %.  I/O:    Intake/Output Summary (Last 24 hours) at 09/19/2019 1509 Last data filed at 09/19/2019 0940 Gross per 24 hour  Intake 360 ml  Output 700 ml  Net -340 ml    PHYSICAL EXAMINATION:  GENERAL:  86 y.o.-year-old patient lying in the bed with no acute distress.  EYES: Pupils equal, round, reactive to light and accommodation. No scleral icterus. Extraocular muscles intact.  HEENT: Head atraumatic, normocephalic. Oropharynx and nasopharynx clear.  LUNGS: Decreased breath sounds bilateral bases, no wheezing, rales,rhonchi or crepitation. No use of accessory muscles of respiration.  CARDIOVASCULAR: S1, S2 normal. No murmurs, rubs, or gallops.  ABDOMEN: Soft, non-tender, non-distended. Bowel sounds present. No organomegaly or mass.  EXTREMITIES: Trace pedal  edema.  NEUROLOGIC: Cranial nerves II through XII are intact. Muscle strength 5/5 in all extremities. Sensation intact. Gait not checked.  PSYCHIATRIC: The patient is alert and oriented x 3.  SKIN: No obvious rash, lesion, or ulcer.   DATA REVIEW:   CBC Recent Labs  Lab 09/17/19 1055  WBC 6.4  HGB 11.0*  HCT 34.2*  PLT 316    Chemistries  Recent Labs  Lab 09/19/19 0713  NA 141  K 3.6  CL 102  CO2 29  GLUCOSE 129*  BUN 20  CREATININE 1.39*  CALCIUM 9.4     Microbiology Results  Results for orders placed or performed during the hospital encounter of 09/17/19  SARS Coronavirus 2 by RT PCR (hospital order, performed in [facility] hospital lab) Nasopharyngeal Nasopharyngeal Swab     Status: None   Collection Time: 09/17/19 10:55 AM   Specimen: Nasopharyngeal Swab  Result Value Ref Range Status   SARS Coronavirus 2 NEGATIVE NEGATIVE Final    Comment: (NOTE) SARS-CoV-2 target nucleic acids are NOT DETECTED.  The SARS-CoV-2 RNA is generally detectable in upper and lower respiratory specimens during the acute phase of infection. The lowest concentration of SARS-CoV-2 viral copies this assay can detect is 250 copies / mL. A negative result does not preclude SARS-CoV-2 infection and should not be used as the sole basis for treatment or other patient management decisions.  A negative result may occur with improper specimen collection / handling, submission of specimen other than nasopharyngeal swab, presence of viral mutation(s) within the areas targeted by this assay, and inadequate number of viral copies (<250 copies / mL). A negative result must be combined with clinical observations, patient history, and epidemiological information.  Fact Sheet for Patients:   StrictlyIdeas.no  Fact Sheet for Healthcare Providers: BankingDealers.co.za  This test is not yet approved or  cleared by the [place] FDA and has been  authorized for detection and/or diagnosis of SARS-CoV-2 by FDA under an Emergency Use Authorization (EUA).  This EUA will remain in effect (meaning this test can be used) for the duration of the COVID-19 declaration under Section 564(b)(1) of the Act, 21 U.S.C. section 360bbb-3(b)(1), unless the authorization is terminated or revoked sooner.  Performed at [facility], [address] [place], [place] [address]     RADIOLOGY:  ECHOCARDIOGRAM COMPLETE  Result Date: 09/18/2019    ECHOCARDIOGRAM REPORT   Patient Name:   Evelyn Simmons Date of Exam: 09/18/2019 Medical Rec #:  [id number]     Height:       59.0 in Accession #:    [id number]    Weight:       174.7 lb Date of Birth:  February 19, 1929      BSA:          1.741 m Patient Age:    26 years      BP:           121/59 mmHg Patient Gender: F             HR:           86 bpm. Exam Location:  [facility] Procedure: 2D Echo, Cardiac Doppler and Color Doppler Indications:     CHF-Acute Diastolic 536.64 / Q03.47  History:         Patient has no prior history of Echocardiogram examinations.                  CAD; Arrythmias:Atrial Fibrillation. MI.  Sonographer:     Alyse Low Roar Referring Phys:  [place] Diagnosing Phys: Skeet Latch MD IMPRESSIONS  1. Left ventricular ejection fraction, by estimation, is 45 to 50%. The left ventricle has mildly decreased function. The left ventricle demonstrates global hypokinesis. There is mild concentric left ventricular hypertrophy. Left ventricular diastolic parameters are indeterminate. Elevated left ventricular end-diastolic pressure.  2. Right ventricular systolic function is mildly reduced. The right ventricular size is normal. There is mildly elevated pulmonary artery systolic pressure.  3. Left atrial size was severely dilated.  4. Right atrial size was mildly dilated.  5. The mitral valve is normal in structure. Mild mitral valve regurgitation. No evidence of mitral stenosis.  6. The aortic valve is  tricuspid. Aortic valve regurgitation is not visualized. No aortic stenosis is present.  7. The inferior vena cava is normal in size with <50% respiratory variability, suggesting right atrial pressure of 8 mmHg. FINDINGS  Left Ventricle: Left ventricular ejection fraction, by estimation, is 45 to 50%. The left ventricle has mildly decreased function. The left ventricle demonstrates global hypokinesis. The left ventricular internal cavity size was normal in size. There is  mild concentric left ventricular hypertrophy. Left ventricular diastolic parameters are indeterminate. Elevated left ventricular end-diastolic pressure. Right Ventricle: The right ventricular size is normal. No increase in right ventricular wall thickness. Right ventricular systolic function is mildly reduced. There is mildly elevated pulmonary artery systolic pressure. The tricuspid regurgitant velocity  is 2.92 m/s, and with an assumed right atrial pressure of 8 mmHg, the estimated right ventricular systolic pressure is 42.5 mmHg. Left Atrium: Left atrial size was severely dilated. Right Atrium: Right atrial size was mildly dilated. Pericardium: Trivial pericardial effusion is present. Mitral Valve: The mitral valve is normal in structure. There is moderate thickening of the mitral valve leaflet(s). There is moderate calcification of the mitral valve leaflet(s). Normal mobility of the mitral valve leaflets. Severe mitral annular calcification. Mild mitral valve regurgitation, with posteriorly-directed jet. No evidence of mitral valve stenosis. Tricuspid Valve: The tricuspid valve is normal in structure. Tricuspid valve regurgitation is mild . No evidence of tricuspid stenosis. Aortic Valve: The aortic valve is tricuspid. . There is mild thickening and mild calcification of the aortic valve. Aortic valve regurgitation is not visualized. No aortic stenosis is present. There is mild thickening of the aortic valve. There is mild calcification of the  aortic valve. Aortic valve peak gradient measures 12.4 mmHg. Pulmonic Valve: The pulmonic valve was normal in structure. Pulmonic valve regurgitation  is not visualized. No evidence of pulmonic stenosis. Aorta: The aortic root is normal in size and structure. Venous: The inferior vena cava is normal in size with less than 50% respiratory variability, suggesting right atrial pressure of 8 mmHg. IAS/Shunts: No atrial level shunt detected by color flow Doppler. Additional Comments: A pacer wire is visualized.  LEFT VENTRICLE PLAX 2D LVIDd:         4.74 cm  Diastology LVIDs:         3.56 cm  LV e' lateral:   7.83 cm/s LV PW:         1.17 cm  LV E/e' lateral: 20.2 LV IVS:        1.26 cm  LV e' medial:    7.29 cm/s LVOT diam:     1.90 cm  LV E/e' medial:  21.7 LVOT Area:     2.84 cm  RIGHT VENTRICLE RV Mid diam:    2.61 cm RV S prime:     9.90 cm/s TAPSE (M-mode): 1.2 cm LEFT ATRIUM              Index       RIGHT ATRIUM           Index LA diam:        4.90 cm  2.81 cm/m  RA Area:     22.50 cm LA Vol (A2C):   147.0 ml 84.42 ml/m RA Volume:   54.40 ml  31.24 ml/m LA Vol (A4C):   115.0 ml 66.04 ml/m LA Biplane Vol: 130.0 ml 74.66 ml/m  AORTIC VALVE                PULMONIC VALVE AV Area (Vmax): 1.22 cm    PV Vmax:        1.10 m/s AV Vmax:        176.00 cm/s PV Peak grad:   4.8 mmHg AV Peak Grad:   12.4 mmHg   RVOT Peak grad: 2 mmHg LVOT Vmax:      75.60 cm/s  AORTA Ao Root diam: 2.50 cm MITRAL VALVE                TRICUSPID VALVE MV Area (PHT): 5.88 cm     TR Peak grad:   34.1 mmHg MV Decel Time: 129 msec     TR Vmax:        292.00 cm/s MV E velocity: 158.00 cm/s                             SHUNTS                             Systemic Diam: 1.90 cm Skeet Latch MD Electronically signed by Skeet Latch MD Signature Date/Time: 09/18/2019/12:24:31 PM    Final       Management plans discussed with the patient, family and they are in agreement.  CODE STATUS:     Code Status Orders  (From admission, onward)          Start     Ordered   09/17/19 1425  Do not attempt resuscitation (DNR)  Continuous       Question Answer Comment  In the event of cardiac or respiratory ARREST Do not call a "code blue"   In the event of cardiac or respiratory ARREST Do not perform Intubation, CPR, defibrillation or ACLS   In the event  of cardiac or respiratory ARREST Use medication by any route, position, wound care, and other measures to relive pain and suffering. May use oxygen, suction and manual treatment of airway obstruction as needed for comfort.   Comments Discussed CODE STATUS with patient who wants to be placed in a DO NOT RESUSCITATE status      09/17/19 1426        Code Status History    This patient has a current code status but no historical code status.   Advance Care Planning Activity    Advance Directive Documentation     Most Recent Value  Type of Advance Directive Healthcare Power of Attorney, Living will, Out of facility DNR (pink MOST or yellow form)  Pre-existing out of facility DNR order (yellow form or pink MOST form) --  "MOST" Form in Place? --      TOTAL TIME TAKING CARE OF THIS PATIENT: 35 minutes.    Loletha Grayer M.D on 09/19/2019 at 3:09 PM  Between 7am to 6pm - Pager - [phone]  After 6pm go to [email or web address] - password [email or web address]  [facility]  CC: Primary care physician; [facility]

## 2019-09-19 NOTE — Discharge Instructions (Signed)
Chronic Respiratory Failure  Respiratory failure is a condition in which the lungs do not work well and the breathing (respiratory) system fails. When respiratory failure occurs, it becomes difficult for the lungs to get enough oxygen or to eliminate carbon dioxide or to do both duties. If the lungs do not work properly, the heart, brain, and other body systems do not get enough oxygen. Respiratory failure is life-threatening if it is not treated. Respiratory failure can be acute or chronic. Acute respiratory failure is sudden and severe and requires emergency medical treatment. Chronic respiratory failure happens over time, usually due to a medical condition that gets worse. What are the causes? This condition may be caused by any problem that affects the heart or lungs. Causes include:  Chronic bronchitis and emphysema (COPD).  Pulmonary fibrosis.  Water in the lungs due to heart failure, lung injury, or infection (pulmonary edema).  Asthma.  Nerve or muscle diseases that make chest movements difficult, such as Leta Baptist disease or Guillain-Barre syndrome.  A collapsed lung (pneumothorax).  Pulmonary hypertension.  Chronic sleep apnea.  Pneumonia.  Obesity.  A blood clot in a lung (pulmonary embolism).  Trauma to the chest that makes breathing difficult. What increases the risk? You are more likely to develop this condition if:  You are a smoker, or have a history of smoking.  You have a weak immune system.  You have a family history of breathing problems or lung disease.  You have a long term lung disease such as COPD. What are the signs or symptoms? Symptoms of this condition include:  Shortness of breath with or without activity.  Difficulty breathing.  Wheezing.  A fast or irregular heartbeat (arrhythmia).  Chest pain or tightness.  A bluish color to the fingernail or toenail beds (cyanosis).  Confusion.  Drowsiness.  Extreme fatigue, especially  with minimal activity. How is this diagnosed? This condition may be diagnosed based on:  Your medical history.  A physical exam.  Other tests, such as: ? A chest X-ray. ? A CT scan of your lungs. ? Blood tests, such as an arterial blood gas test. This test is done to check if you have enough oxygen in your blood. ? An electrocardiogram. This test records the electrical activity of your heart. ? An echocardiogram. This test uses sound waves to produce an image of your heart.  A check of your blood pressure, heart rate, breathing rate, and blood oxygen level. How is this treated? Treatment for this condition depends on the cause. Treatment can include the following:  Getting oxygen through a nasal cannula. This is a tube that goes in your nose.  Getting oxygen through a face mask.  Receiving noninvasive positive pressure ventilation. This is a method of breathing support in which a machine blows air into your lungs through a mask. The machine allows you to breathe on your own. It helps the body take in oxygen and eliminate carbon dioxide.  Using a ventilator. This is a breathing machine that delivers oxygen to the lungs through a breathing tube that is put into the trachea. This machine is used when you can no longer breathe well enough on your own.  Medicines to help with breathing, such as: ? Medicines that open up and relax air passages, such as bronchodilators. These may be given through a device that turns liquid medicines into a mist you can breathe in (nebulizer). These medicines help with breathing. ? Diuretics. These medicines get rid of extra fluid  out of your lungs, which can help you breathe better. ? Steroid medicines. These decrease inflammation in the lungs. ? Antibiotic medicines. These may be given to treat a bacterial infection, such as pneumonia.  Pulmonary rehabilitation. This is an exercise program that strengthens the muscles in your chest and helps you learn  breathing techniques in order to manage your condition. Follow these instructions at home: Medicines  Take over-the-counter and prescription medicines only as told by your health care provider.  If you were prescribed an antibiotic medicine, take it as told by your health care provider. Do not stop taking the antibiotic even if you start to feel better. General instructions  Use oxygen therapy and pulmonary rehabilitation if directed to by your health care provider. If you require home oxygen therapy, ask your health care provider whether you should purchase a pulse oximeter to measure your oxygen level at home.  Work with your health care provider to create a plan to help you deal with your condition. Follow this plan.  Do not use any products that contain nicotine or tobacco, such as cigarettes and e-cigarettes. If you need help quitting, ask your health care provider.  Avoid exposure to irritants that make your breathing problems worse. These include smoke, chemicals, and fumes.  Stay active, but balance activity with periods of rest. Exercise and physical activity will help you maintain your ability to do things you want to do.  Stay up to date on all vaccines, especially yearly influenza and pneumonia vaccines.  Avoid people who are sick as well as crowded places during the flu season.  Keep all follow-up visits as told by your health care provider. This is important. Contact a health care provider if:  Your shortness of breath gets worse and you cannot do the things you used to do.  You have increased mucus (sputum), wheezing, coughing, or loss of energy.  You are on oxygen therapy and you are starting to need more.  You need to use your medicines more often.  You have a fever. Get help right away if:  Your shortness of breath becomes worse.  You are unable to say more than a few words without having to catch your breath.  You develop chest pain or  tightness. Summary  Respiratory failure is a condition in which the lungs do not work well and the breathing system fails.  This condition can be very serious and is often life-threatening.  This condition is diagnosed with tests and can be treated with medicines or oxygen.  Contact a health care provider if your shortness of breath gets worse or if you need to use your oxygen or medicines more often than before. This information is not intended to replace advice given to you by your health care provider. Make sure you discuss any questions you have with your health care provider. Document Revised: 01/10/2017 Document Reviewed: 02/09/2016 Elsevier Patient Education  [place].   Atrial Fibrillation  Atrial fibrillation is a type of irregular or rapid heartbeat (arrhythmia). In atrial fibrillation, the top part of the heart (atria) beats in an irregular pattern. This makes the heart unable to pump blood normally and effectively. The goal of treatment is to prevent blood clots from forming, control your heart rate, or restore your heartbeat to a normal rhythm. If this condition is not treated, it can cause serious problems, such as a weakened heart muscle (cardiomyopathy) or a stroke. What are the causes? This condition is often caused by medical  conditions that damage the heart's electrical system. These include:  High blood pressure (hypertension). This is the most common cause.  Certain heart problems or conditions, such as heart failure, coronary artery disease, heart valve problems, or heart surgery.  Diabetes.  Overactive thyroid (hyperthyroidism).  Obesity.  Chronic kidney disease. In some cases, the cause of this condition is not known. What increases the risk? This condition is more likely to develop in:  Older people.  People who smoke.  Athletes who do endurance exercise.  People who have a family history of atrial fibrillation.  Men.  People who use  drugs.  People who drink a lot of alcohol.  People who have lung conditions, such as emphysema, pneumonia, or COPD.  People who have obstructive sleep apnea. What are the signs or symptoms? Symptoms of this condition include:  A feeling that your heart is racing or beating irregularly.  Discomfort or pain in your chest.  Shortness of breath.  Sudden light-headedness or weakness.  Tiring easily during exercise or activity.  Fatigue.  Syncope (fainting).  Sweating. In some cases, there are no symptoms. How is this diagnosed? Your health care provider may detect atrial fibrillation when taking your pulse. If detected, this condition may be diagnosed with:  An electrocardiogram (ECG) to check electrical signals of the heart.  An ambulatory cardiac monitor to record your heart's activity for a few days.  A transthoracic echocardiogram (TTE) to create pictures of your heart.  A transesophageal echocardiogram (TEE) to create even closer pictures of your heart.  A stress test to check your blood supply while you exercise.  Imaging tests, such as a CT scan or chest X-ray.  Blood tests. How is this treated? Treatment depends on underlying conditions and how you feel when you experience atrial fibrillation. This condition may be treated with:  Medicines to prevent blood clots or to treat heart rate or heart rhythm problems.  Electrical cardioversion to reset the heart's rhythm.  A pacemaker to correct abnormal heart rhythm.  Ablation to remove the heart tissue that sends abnormal signals.  Left atrial appendage closure to seal the area where blood clots can form. In some cases, underlying conditions will be treated. Follow these instructions at home: Medicines  Take over-the counter and prescription medicines only as told by your health care provider.  Do not take any new medicines without talking to your health care provider.  If you are taking blood  thinners: ? Talk with your health care provider before you take any medicines that contain aspirin or NSAIDs, such as ibuprofen. These medicines increase your risk for dangerous bleeding. ? Take your medicine exactly as told, at the same time every day. ? Avoid activities that could cause injury or bruising, and follow instructions about how to prevent falls. ? Wear a medical alert bracelet or carry a card that lists what medicines you take. Lifestyle      Do not use any products that contain nicotine or tobacco, such as cigarettes, e-cigarettes, and chewing tobacco. If you need help quitting, ask your health care provider.  Eat heart-healthy foods. Talk with a dietitian to make an eating plan that is right for you.  Exercise regularly as told by your health care provider.  Do not drink alcohol.  Lose weight if you are overweight.  Do not use drugs, including cannabis. General instructions  If you have obstructive sleep apnea, manage your condition as told by your health care provider.  Do not use diet pills  unless your health care provider approves. Diet pills can make heart problems worse.  Keep all follow-up visits as told by your health care provider. This is important. Contact a health care provider if you:  Notice a change in the rate, rhythm, or strength of your heartbeat.  Are taking a blood thinner and you notice more bruising.  Tire more easily when you exercise or do heavy work.  Have a sudden change in weight. Get help right away if you have:   Chest pain, abdominal pain, sweating, or weakness.  Trouble breathing.  Side effects of blood thinners, such as blood in your vomit, stool, or urine, or bleeding that cannot stop.  Any symptoms of a stroke. "BE FAST" is an easy way to remember the main warning signs of a stroke: ? B - Balance. Signs are dizziness, sudden trouble walking, or loss of balance. ? E - Eyes. Signs are trouble seeing or a sudden change in  vision. ? F - Face. Signs are sudden weakness or numbness of the face, or the face or eyelid drooping on one side. ? A - Arms. Signs are weakness or numbness in an arm. This happens suddenly and usually on one side of the body. ? S - Speech. Signs are sudden trouble speaking, slurred speech, or trouble understanding what people say. ? T - Time. Time to call emergency services. Write down what time symptoms started.  Other signs of a stroke, such as: ? A sudden, severe headache with no known cause. ? Nausea or vomiting. ? Seizure. These symptoms may represent a serious problem that is an emergency. Do not wait to see if the symptoms will go away. Get medical help right away. Call your local emergency services (911 in the [place] Do not drive yourself to the hospital. Summary  Atrial fibrillation is a type of irregular or rapid heartbeat (arrhythmia).  Symptoms include a feeling that your heart is beating fast or irregularly.  You may be given medicines to prevent blood clots or to treat heart rate or heart rhythm problems.  Get help right away if you have signs or symptoms of a stroke.  Get help right away if you cannot catch your breath or have chest pain or pressure. This information is not intended to replace advice given to you by your health care provider. Make sure you discuss any questions you have with your health care provider. Document Revised: 07/22/2018 Document Reviewed: 07/22/2018 Elsevier Patient Education  [place]. Atrial Fibrillation  Atrial fibrillation is a type of irregular or rapid heartbeat (arrhythmia). In atrial fibrillation, the top part of the heart (atria) beats in an irregular pattern. This makes the heart unable to pump blood normally and effectively. The goal of treatment is to prevent blood clots from forming, control your heart rate, or restore your heartbeat to a normal rhythm. If this condition is not treated, it can cause serious problems, such  as a weakened heart muscle (cardiomyopathy) or a stroke. What are the causes? This condition is often caused by medical conditions that damage the heart's electrical system. These include:  High blood pressure (hypertension). This is the most common cause.  Certain heart problems or conditions, such as heart failure, coronary artery disease, heart valve problems, or heart surgery.  Diabetes.  Overactive thyroid (hyperthyroidism).  Obesity.  Chronic kidney disease. In some cases, the cause of this condition is not known. What increases the risk? This condition is more likely to develop in:  Older people.  People who smoke.  Athletes who do endurance exercise.  People who have a family history of atrial fibrillation.  Men.  People who use drugs.  People who drink a lot of alcohol.  People who have lung conditions, such as emphysema, pneumonia, or COPD.  People who have obstructive sleep apnea. What are the signs or symptoms? Symptoms of this condition include:  A feeling that your heart is racing or beating irregularly.  Discomfort or pain in your chest.  Shortness of breath.  Sudden light-headedness or weakness.  Tiring easily during exercise or activity.  Fatigue.  Syncope (fainting).  Sweating. In some cases, there are no symptoms. How is this diagnosed? Your health care provider may detect atrial fibrillation when taking your pulse. If detected, this condition may be diagnosed with:  An electrocardiogram (ECG) to check electrical signals of the heart.  An ambulatory cardiac monitor to record your heart's activity for a few days.  A transthoracic echocardiogram (TTE) to create pictures of your heart.  A transesophageal echocardiogram (TEE) to create even closer pictures of your heart.  A stress test to check your blood supply while you exercise.  Imaging tests, such as a CT scan or chest X-ray.  Blood tests. How is this treated? Treatment  depends on underlying conditions and how you feel when you experience atrial fibrillation. This condition may be treated with:  Medicines to prevent blood clots or to treat heart rate or heart rhythm problems.  Electrical cardioversion to reset the heart's rhythm.  A pacemaker to correct abnormal heart rhythm.  Ablation to remove the heart tissue that sends abnormal signals.  Left atrial appendage closure to seal the area where blood clots can form. In some cases, underlying conditions will be treated. Follow these instructions at home: Medicines  Take over-the counter and prescription medicines only as told by your health care provider.  Do not take any new medicines without talking to your health care provider.  If you are taking blood thinners: ? Talk with your health care provider before you take any medicines that contain aspirin or NSAIDs, such as ibuprofen. These medicines increase your risk for dangerous bleeding. ? Take your medicine exactly as told, at the same time every day. ? Avoid activities that could cause injury or bruising, and follow instructions about how to prevent falls. ? Wear a medical alert bracelet or carry a card that lists what medicines you take. Lifestyle      Do not use any products that contain nicotine or tobacco, such as cigarettes, e-cigarettes, and chewing tobacco. If you need help quitting, ask your health care provider.  Eat heart-healthy foods. Talk with a dietitian to make an eating plan that is right for you.  Exercise regularly as told by your health care provider.  Do not drink alcohol.  Lose weight if you are overweight.  Do not use drugs, including cannabis. General instructions  If you have obstructive sleep apnea, manage your condition as told by your health care provider.  Do not use diet pills unless your health care provider approves. Diet pills can make heart problems worse.  Keep all follow-up visits as told by your  health care provider. This is important. Contact a health care provider if you:  Notice a change in the rate, rhythm, or strength of your heartbeat.  Are taking a blood thinner and you notice more bruising.  Tire more easily when you exercise or do heavy work.  Have a sudden change in weight. Get  help right away if you have:   Chest pain, abdominal pain, sweating, or weakness.  Trouble breathing.  Side effects of blood thinners, such as blood in your vomit, stool, or urine, or bleeding that cannot stop.  Any symptoms of a stroke. "BE FAST" is an easy way to remember the main warning signs of a stroke: ? B - Balance. Signs are dizziness, sudden trouble walking, or loss of balance. ? E - Eyes. Signs are trouble seeing or a sudden change in vision. ? F - Face. Signs are sudden weakness or numbness of the face, or the face or eyelid drooping on one side. ? A - Arms. Signs are weakness or numbness in an arm. This happens suddenly and usually on one side of the body. ? S - Speech. Signs are sudden trouble speaking, slurred speech, or trouble understanding what people say. ? T - Time. Time to call emergency services. Write down what time symptoms started.  Other signs of a stroke, such as: ? A sudden, severe headache with no known cause. ? Nausea or vomiting. ? Seizure. These symptoms may represent a serious problem that is an emergency. Do not wait to see if the symptoms will go away. Get medical help right away. Call your local emergency services (911 in the [place] Do not drive yourself to the hospital. Summary  Atrial fibrillation is a type of irregular or rapid heartbeat (arrhythmia).  Symptoms include a feeling that your heart is beating fast or irregularly.  You may be given medicines to prevent blood clots or to treat heart rate or heart rhythm problems.  Get help right away if you have signs or symptoms of a stroke.  Get help right away if you cannot catch your breath or  have chest pain or pressure. This information is not intended to replace advice given to you by your health care provider. Make sure you discuss any questions you have with your health care provider. Document Revised: 07/22/2018 Document Reviewed: 07/22/2018 Elsevier Patient Education  [place].   Heart Failure, Diagnosis  Heart failure means that your heart is not able to pump blood in the right way. This makes it hard for your body to work well. Heart failure is usually a long-term (chronic) condition. You must take good care of yourself and follow your treatment plan from your doctor. What are the causes? This condition may be caused by:  High blood pressure.  Build up of cholesterol and fat in the arteries.  Heart attack. This injures the heart muscle.  Heart valves that do not open and close properly.  Damage of the heart muscle. This is also called cardiomyopathy.  Lung disease.  Abnormal heart rhythms. What increases the risk? The risk of heart failure goes up as a person ages. This condition is also more likely to develop in people who:  Are overweight.  Are female.  Smoke or chew tobacco.  Abuse alcohol or illegal drugs.  Have taken medicines that can damage the heart.  Have diabetes.  Have abnormal heart rhythms.  Have thyroid problems.  Have low blood counts (anemia). What are the signs or symptoms? Symptoms of this condition include:  Shortness of breath.  Coughing.  Swelling of the feet, ankles, legs, or belly.  Losing weight for no reason.  Trouble breathing.  Waking from sleep because of the need to sit up and get more air.  Rapid heartbeat.  Being very tired.  Feeling dizzy, or feeling like you may pass  out (faint).  Having no desire to eat.  Feeling like you may vomit (nauseous).  Peeing (urinating) more at night.  Feeling confused. How is this treated?     This condition may be treated with:  Medicines. These can  be given to treat blood pressure and to make the heart muscles stronger.  Changes in your daily life. These may include eating a healthy diet, staying at a healthy body weight, quitting tobacco and illegal drug use, or doing exercises.  Surgery. Surgery can be done to open blocked valves, or to put devices in the heart, such as pacemakers.  A donor heart (heart transplant). You will receive a healthy heart from a donor. Follow these instructions at home:  Treat other conditions as told by your doctor. These may include high blood pressure, diabetes, thyroid disease, or abnormal heart rhythms.  Learn as much as you can about heart failure.  Get support as you need it.  Keep all follow-up visits as told by your doctor. This is important. Summary  Heart failure means that your heart is not able to pump blood in the right way.  This condition is caused by high blood pressure, heart attack, or damage of the heart muscle.  Symptoms of this condition include shortness of breath and swelling of the feet, ankles, legs, or belly. You may also feel very tired or feel like you may vomit.  You may be treated with medicines, surgery, or changes in your daily life.  Treat other health conditions as told by your doctor. This information is not intended to replace advice given to you by your health care provider. Make sure you discuss any questions you have with your health care provider. Document Revised: 04/17/2018 Document Reviewed: 04/17/2018 Elsevier Patient Education  [place].

## 2019-09-20 ENCOUNTER — Telehealth: Payer: Self-pay | Admitting: Internal Medicine

## 2019-09-20 NOTE — Telephone Encounter (Signed)
Spoke with patient   She wants arrangements made with [facility] so they can scheduled pick up and pay for visit.   lmov to schedule [phone]

## 2019-09-20 NOTE — Telephone Encounter (Signed)
-----   Message from Rise Mu, PA-C sent at 09/19/2019  6:17 PM EDT ----- Can she get TCM follow up with an APP or primary cardiologist. Thanks!

## 2019-09-22 NOTE — Telephone Encounter (Signed)
Per notes in Epic, pt admitted 09/17/19-09/19/19 for AF w/RVR. Started on amiodarone and Toprol-XL 12.5mg  daily. No anticoagulation secondary to fall risk and bleeding risk.

## 2019-09-24 NOTE — Telephone Encounter (Signed)
To [employer] scheduling pool. Please call the patient to arrange for an in office appointment with Dr. Caryl Comes on a Tuesday. She has a device and is overdue by almost 2 years.   Thanks!

## 2019-09-27 NOTE — Telephone Encounter (Signed)
Patient was called and scheduled for October 5ht. [facility] was contacted and a vm was left for Elmyra Ricks to obtain [employer]

## 2019-09-29 ENCOUNTER — Other Ambulatory Visit: Payer: Self-pay

## 2019-09-29 ENCOUNTER — Encounter: Payer: Self-pay | Admitting: Family

## 2019-09-29 ENCOUNTER — Ambulatory Visit: Payer: Medicare (Managed Care) | Attending: Family | Admitting: Family

## 2019-09-29 VITALS — BP 127/85 | HR 96 | Resp 18 | Ht 59.0 in | Wt 179.0 lb

## 2019-09-29 DIAGNOSIS — E785 Hyperlipidemia, unspecified: Secondary | ICD-10-CM | POA: Insufficient documentation

## 2019-09-29 DIAGNOSIS — Z7901 Long term (current) use of anticoagulants: Secondary | ICD-10-CM | POA: Insufficient documentation

## 2019-09-29 DIAGNOSIS — K219 Gastro-esophageal reflux disease without esophagitis: Secondary | ICD-10-CM | POA: Diagnosis not present

## 2019-09-29 DIAGNOSIS — I252 Old myocardial infarction: Secondary | ICD-10-CM | POA: Diagnosis not present

## 2019-09-29 DIAGNOSIS — I10 Essential (primary) hypertension: Secondary | ICD-10-CM

## 2019-09-29 DIAGNOSIS — Z950 Presence of cardiac pacemaker: Secondary | ICD-10-CM | POA: Diagnosis not present

## 2019-09-29 DIAGNOSIS — Z7982 Long term (current) use of aspirin: Secondary | ICD-10-CM | POA: Diagnosis not present

## 2019-09-29 DIAGNOSIS — M1990 Unspecified osteoarthritis, unspecified site: Secondary | ICD-10-CM | POA: Diagnosis not present

## 2019-09-29 DIAGNOSIS — Z955 Presence of coronary angioplasty implant and graft: Secondary | ICD-10-CM | POA: Insufficient documentation

## 2019-09-29 DIAGNOSIS — E039 Hypothyroidism, unspecified: Secondary | ICD-10-CM | POA: Insufficient documentation

## 2019-09-29 DIAGNOSIS — I5042 Chronic combined systolic (congestive) and diastolic (congestive) heart failure: Secondary | ICD-10-CM | POA: Insufficient documentation

## 2019-09-29 DIAGNOSIS — I2510 Atherosclerotic heart disease of native coronary artery without angina pectoris: Secondary | ICD-10-CM | POA: Insufficient documentation

## 2019-09-29 DIAGNOSIS — H5462 Unqualified visual loss, left eye, normal vision right eye: Secondary | ICD-10-CM | POA: Insufficient documentation

## 2019-09-29 DIAGNOSIS — J3489 Other specified disorders of nose and nasal sinuses: Secondary | ICD-10-CM | POA: Diagnosis not present

## 2019-09-29 DIAGNOSIS — E119 Type 2 diabetes mellitus without complications: Secondary | ICD-10-CM | POA: Diagnosis not present

## 2019-09-29 DIAGNOSIS — R0602 Shortness of breath: Secondary | ICD-10-CM | POA: Insufficient documentation

## 2019-09-29 DIAGNOSIS — J449 Chronic obstructive pulmonary disease, unspecified: Secondary | ICD-10-CM | POA: Diagnosis not present

## 2019-09-29 DIAGNOSIS — K5900 Constipation, unspecified: Secondary | ICD-10-CM | POA: Insufficient documentation

## 2019-09-29 DIAGNOSIS — I5022 Chronic systolic (congestive) heart failure: Secondary | ICD-10-CM

## 2019-09-29 DIAGNOSIS — E1122 Type 2 diabetes mellitus with diabetic chronic kidney disease: Secondary | ICD-10-CM

## 2019-09-29 DIAGNOSIS — I110 Hypertensive heart disease with heart failure: Secondary | ICD-10-CM | POA: Diagnosis not present

## 2019-09-29 DIAGNOSIS — F329 Major depressive disorder, single episode, unspecified: Secondary | ICD-10-CM | POA: Insufficient documentation

## 2019-09-29 DIAGNOSIS — I4891 Unspecified atrial fibrillation: Secondary | ICD-10-CM | POA: Diagnosis not present

## 2019-09-29 DIAGNOSIS — Z79899 Other long term (current) drug therapy: Secondary | ICD-10-CM | POA: Insufficient documentation

## 2019-09-29 DIAGNOSIS — Z87891 Personal history of nicotine dependence: Secondary | ICD-10-CM | POA: Insufficient documentation

## 2019-09-29 DIAGNOSIS — Z8249 Family history of ischemic heart disease and other diseases of the circulatory system: Secondary | ICD-10-CM | POA: Insufficient documentation

## 2019-09-29 DIAGNOSIS — I480 Paroxysmal atrial fibrillation: Secondary | ICD-10-CM

## 2019-09-29 NOTE — Progress Notes (Signed)
Patient ID: Evelyn Simmons, female    DOB: Sep 11, 1929, 86 y.o.   MRN: [id number]  HPI  Evelyn Simmons is a 86 y/o female with a history of asthma, CAD, DM, hyperlipidemia, HTN, thyroid disease, depression, COPD, GERD, MI, atrial fibrillation, previous tobacco use and chronic heart failure.   Echo report from 09/18/19 reviewed and showed an EF of 45-50% along with mild LVH, mildly elevated PA pressure, severe LAE and mild MR.  Admitted 09/17/19 due to acute on chronic HF along with AF/ RVR. Oral medications adjusted. Initially given IV lasix with transition to oral diuretics. Cardiology consult obtained. Discharged after 2 days.    She presents today for her initial visit with a chief complaint of minimal shortness of breath upon moderate exertion. She describes this as chronic in nature having been present for several years. She has associated rhinorrhea, left eye blindness and occasional constipation along with this. She denies any difficulty sleeping, dizziness, abdominal distention, palpitations, pedal edema, chest pain, cough, weight gain or fatigue.   She says that she lives at [facility] and that there's a guy who lives between her walls and removes a panel out when he hears the water running in her shower. She says that she's reported this to the police, fire station, management at [facility] and to [employer] and "nothing's been done". She says that she suggested that instead of sending someone to look for this man between the walls, that a dog could be sent and it would sniff the man out.   Says that she has no intention of getting the COVID vaccine.   Past Medical History:  Diagnosis Date  . Arthritis   . Asthma   . CAD (coronary artery disease)   . CHF (congestive heart failure) ([place])   . COPD (chronic obstructive pulmonary disease) ([place])   . Depression   . Diabetes mellitus without complication ([place])   . Diastolic heart failure ([place])   . Diverticulitis   . GERD  (gastroesophageal reflux disease)   . Heart disease   . Hyperlipidemia   . Hypertension   . Hypothyroid    pt unsure, not currently on medication  . MI (myocardial infarction) ([place])    1995  . Persistent atrial fibrillation ([place])   . Radiculopathy    Past Surgical History:  Procedure Laterality Date  . ABDOMINAL HYSTERECTOMY    . APPENDECTOMY    . CARDIAC CATHETERIZATION    . CORONARY ANGIOPLASTY WITH STENT PLACEMENT  12/11/1993   RCA  . FOOT SURGERY Bilateral    Over 25 years ago- Bunionectomy  . INSERT / REPLACE / REMOVE PACEMAKER  09/2012   Model # D622  serial # [id number]  . JOINT REPLACEMENT     bilateral knee replacements   Family History  Problem Relation Age of Onset  . Arthritis Mother   . Heart disease Mother   . Hypertension Mother   . Diabetes Mother   . Arthritis Father   . Heart disease Father   . Hypertension Father   . Cancer Sister        breast cancer  . Diabetes Brother   . Cancer Daughter        lung cancer  . Diabetes Sister    Social History   Tobacco Use  . Smoking status: Former Smoker    Quit date: 12/10/1993    Years since quitting: 25.8  . Smokeless tobacco: Former Network engineer Use Topics  . Alcohol use: Yes  Alcohol/week: 0.0 standard drinks    Comment: Rare   Allergies  Allergen Reactions  . Other Other (See Comments)  . Sulfa Antibiotics     Pt unsure reaction, was told in hospital she had allergy  . Penicillin G Rash  . Penicillins Nausea And Vomiting and Rash   Prior to Admission medications   Medication Sig Start Date End Date Taking? Authorizing Provider  albuterol (PROVENTIL HFA;VENTOLIN HFA) 108 (90 Base) MCG/ACT inhaler Inhale 2 puffs into the lungs every 6 (six) hours as needed for wheezing or shortness of breath.   Yes [provider]  amiodarone (PACERONE) 100 MG tablet Take 1 tablet (100 mg total) by mouth daily. 08/12/17  Yes Deboraha Sprang, MD  aspirin EC 81 MG tablet Take 81 mg by mouth daily.   Yes  [provider]  Blood Glucose Monitoring Suppl (ACCU-CHEK NANO SMARTVIEW) W/DEVICE KIT Please issue nano meter by AccuChek along with test strips at checking frequency of 2x daily. 90 day supply. E11.9 05/13/14  Yes Phadke, Karsten Ro, MD  cholecalciferol (VITAMIN D) 1000 units tablet Take 1,000 Units by mouth daily.   Yes [provider]  cyclobenzaprine (FLEXERIL) 5 MG tablet Take 5 mg by mouth 3 (three) times daily as needed for muscle spasms.   Yes [provider]  DULoxetine (CYMBALTA) 30 MG capsule Take 30 mg by mouth daily.   Yes [provider]  furosemide (LASIX) 40 MG tablet Two tabs twice a day for one day then one tablet twice a day afterwards 09/19/19  Yes Wieting, Richard, MD  glucose blood (FREESTYLE LITE) test strip Use as instructed 05/30/14  Yes Doss, Velora Heckler, RN  HYDROcodone-acetaminophen (NORCO) 10-325 MG per tablet Take 1 tablet by mouth every 4 (four) hours as needed. 05/23/14  Yes Doss, Velora Heckler, RN  ipratropium-albuterol (DUONEB) 0.5-2.5 (3) MG/3ML SOLN Take 3 mLs by nebulization.   Yes [provider]  Iron-Vitamin C (VITRON-C PO) Take by mouth. Take one tablet by mouth twice daily on Monday/ Wednesday/ Friday   Yes [provider]  Lancets (FREESTYLE) lancets Use as instructed 05/30/14  Yes Doss, Velora Heckler, RN  liraglutide (VICTOZA) 18 MG/3ML SOPN Inject into the skin.   Yes [provider]  metoprolol succinate (TOPROL-XL) 25 MG 24 hr tablet Take 0.5 tablets (12.5 mg total) by mouth daily. 09/19/19  Yes Wieting, Richard, MD  pantoprazole (PROTONIX) 20 MG tablet Take 20 mg by mouth daily.   Yes [provider]  Potassium Chloride ER 20 MEQ TBCR One tablet twice a day for one day then one tablet daily afterwards 09/19/19  Yes Wieting, Richard, MD  rosuvastatin (CRESTOR) 40 MG tablet Take 1 tablet (40 mg total) by mouth daily. 05/09/14  Yes Minna Merritts, MD  Sennosides-Docusate Sodium (SENEXON-S PO) Take by  mouth. Take two tablets by mouth once daily   Yes [provider]  traZODone (DESYREL) 100 MG tablet Take 100 mg by mouth at bedtime.   Yes [provider]    Review of Systems  Constitutional: Negative for appetite change and fatigue.  HENT: Positive for rhinorrhea. Negative for congestion and sore throat.   Eyes: Positive for visual disturbance (left eye blindness).  Respiratory: Positive for shortness of breath (with moderate exertion). Negative for cough.   Cardiovascular: Negative for chest pain, palpitations and leg swelling.  Gastrointestinal: Positive for constipation (at times). Negative for abdominal distention and abdominal pain.  Endocrine: Negative.   Genitourinary: Negative.   Musculoskeletal:  Positive for arthralgias (toe pain). Negative for back pain.  Skin: Negative.   Allergic/Immunologic: Negative.   Neurological: Negative for dizziness and light-headedness.  Hematological: Negative for adenopathy. Does not bruise/bleed easily.  Psychiatric/Behavioral: Negative for dysphoric mood and sleep disturbance (sleeping on 3 pillows with oxgyen). The patient is not nervous/anxious.    Vitals:   09/29/19 1135  BP: 127/85  Pulse: 96  Resp: 18  SpO2: 98%  Weight: 179 lb (81.2 kg)  Height: '4\' 11"'  (1.499 m)   Wt Readings from Last 3 Encounters:  09/29/19 179 lb (81.2 kg)  09/19/19 175 lb 7.8 oz (79.6 kg)  08/12/17 176 lb (79.8 kg)   Lab Results  Component Value Date   CREATININE 1.39 (H) 09/19/2019   CREATININE 1.34 (H) 09/18/2019   CREATININE 1.58 (H) 09/17/2019    Physical Exam Vitals and nursing note reviewed.  Constitutional:      Appearance: Normal appearance.  HENT:     Head: Normocephalic and atraumatic.  Cardiovascular:     Rate and Rhythm: Normal rate. Rhythm irregular.  Pulmonary:     Effort: Pulmonary effort is normal. No respiratory distress.     Breath sounds: No wheezing or rales.  Abdominal:     General: There is no  distension.     Palpations: Abdomen is soft.  Musculoskeletal:        General: No tenderness.     Cervical back: Normal range of motion and neck supple.     Right lower leg: No edema.     Left lower leg: No edema.  Skin:    General: Skin is warm and dry.  Neurological:     General: No focal deficit present.     Mental Status: She is alert and oriented to person, place, and time.  Psychiatric:        Mood and Affect: Mood normal.        Behavior: Behavior normal.    Assessment & Plan:  1: Chronic heart failure with reduced EF along with structural changes (LVH/ LAE)- - NYHA class II - euvolemic today  - weighing herself daily; reminded her to call for an overnight weight gain of >2 pounds or a weekly weight gain of >5 pounds - not adding salt but doesn't have much control of the food she eats - not planning on getting COVID vaccine - consider adding entresto at future visits - BNP 09/17/19 was 175.3  2: HTN- - BP looks good today - sees PCP @ [facility] and says that she saw the provider Evelyn Simmons) last week - BMP from 09/19/19 reviewed and showed sodium 141, potassium 3.6, creatinine 1.39 and GFR 34  3: Atrial fibrillation- - sees EP Evelyn Simmons) 11/16/19 - says that she checks her HR at home and ranges from 80's - 160's  4: DM- - A1c 09/17/19 was 6.9%   Patient did not bring her medications nor a list. Each medication was verbally reviewed with the patient and she was encouraged to bring the bottles to every visit to confirm accuracy of list.  Return in 3 months or sooner for any questions/problems before then.

## 2019-09-29 NOTE — Patient Instructions (Signed)
Continue weighing daily and call for an overnight weight gain of > 2 pounds or a weekly weight gain of >5 pounds. 

## 2019-10-02 ENCOUNTER — Emergency Department
Admission: EM | Admit: 2019-10-02 | Discharge: 2019-10-02 | Disposition: A | Discharge status: Home / Self Care | Payer: Medicare (Managed Care) | Attending: Emergency Medicine | Admitting: Emergency Medicine

## 2019-10-02 ENCOUNTER — Emergency Department: Payer: Medicare (Managed Care)

## 2019-10-02 ENCOUNTER — Other Ambulatory Visit: Payer: Self-pay

## 2019-10-02 DIAGNOSIS — R0602 Shortness of breath: Secondary | ICD-10-CM | POA: Diagnosis not present

## 2019-10-02 DIAGNOSIS — Z5321 Procedure and treatment not carried out due to patient leaving prior to being seen by health care provider: Secondary | ICD-10-CM | POA: Diagnosis not present

## 2019-10-02 LAB — COMPREHENSIVE METABOLIC PANEL
ALT: 41 U/L (ref 0–44)
AST: 47 U/L — ABNORMAL HIGH (ref 15–41)
Albumin: 3.5 g/dL (ref 3.5–5.0)
Alkaline Phosphatase: 65 U/L (ref 38–126)
Anion gap: 10 (ref 5–15)
BUN: 25 mg/dL — ABNORMAL HIGH (ref 8–23)
CO2: 28 mmol/L (ref 22–32)
Calcium: 10.1 mg/dL (ref 8.9–10.3)
Chloride: 102 mmol/L (ref 98–111)
Creatinine, Ser: 1.59 mg/dL — ABNORMAL HIGH (ref 0.44–1.00)
GFR calc Af Amer: 33 mL/min — ABNORMAL LOW (ref >60)
GFR calc non Af Amer: 29 mL/min — ABNORMAL LOW (ref >60)
Glucose, Bld: 123 mg/dL — ABNORMAL HIGH (ref 70–99)
Potassium: 4.3 mmol/L (ref 3.5–5.1)
Sodium: 140 mmol/L (ref 135–145)
Total Bilirubin: 0.8 mg/dL (ref 0.3–1.2)
Total Protein: 6.4 g/dL — ABNORMAL LOW (ref 6.5–8.1)

## 2019-10-02 LAB — CBC
HCT: 31.7 % — ABNORMAL LOW (ref 36.0–46.0)
Hemoglobin: 9.7 g/dL — ABNORMAL LOW (ref 12.0–15.0)
MCH: 29.5 pg (ref 26.0–34.0)
MCHC: 30.6 g/dL (ref 30.0–36.0)
MCV: 96.4 fL (ref 80.0–100.0)
Platelets: 285 10*3/uL (ref 150–400)
RBC: 3.29 MIL/uL — ABNORMAL LOW (ref 3.87–5.11)
RDW: 15 % (ref 11.5–15.5)
WBC: 5.7 10*3/uL (ref 4.0–10.5)
nRBC: 0 % (ref 0.0–0.2)

## 2019-10-02 LAB — TROPONIN I (HIGH SENSITIVITY): Troponin I (High Sensitivity): 13 ng/L (ref <18)

## 2019-10-02 LAB — BRAIN NATRIURETIC PEPTIDE: B Natriuretic Peptide: 233.6 pg/mL — ABNORMAL HIGH (ref 0.0–100.0)

## 2019-10-02 NOTE — ED Triage Notes (Signed)
Pt arrives via ems this am c/o sob, states that she feels like she has a glob of mucous in her throat preventing her O2 from getting to her lungs. Pt recently was admitted for having too much fluid on her and she believes that this may have happened again

## 2019-10-04 ENCOUNTER — Emergency Department: Payer: Medicare (Managed Care)

## 2019-10-04 ENCOUNTER — Other Ambulatory Visit: Payer: Self-pay

## 2019-10-04 ENCOUNTER — Inpatient Hospital Stay
Admission: EM | Admit: 2019-10-04 | Discharge: 2019-10-11 | DRG: 291 | Disposition: A | Discharge status: Home / Self Care | Payer: Medicare (Managed Care) | Attending: Internal Medicine | Admitting: Internal Medicine

## 2019-10-04 DIAGNOSIS — Z6837 Body mass index (BMI) 37.0-37.9, adult: Secondary | ICD-10-CM

## 2019-10-04 DIAGNOSIS — T502X5A Adverse effect of carbonic-anhydrase inhibitors, benzothiadiazides and other diuretics, initial encounter: Secondary | ICD-10-CM | POA: Diagnosis present

## 2019-10-04 DIAGNOSIS — Z950 Presence of cardiac pacemaker: Secondary | ICD-10-CM

## 2019-10-04 DIAGNOSIS — Z90710 Acquired absence of both cervix and uterus: Secondary | ICD-10-CM

## 2019-10-04 DIAGNOSIS — J9611 Chronic respiratory failure with hypoxia: Secondary | ICD-10-CM | POA: Diagnosis not present

## 2019-10-04 DIAGNOSIS — I509 Heart failure, unspecified: Secondary | ICD-10-CM

## 2019-10-04 DIAGNOSIS — K5900 Constipation, unspecified: Secondary | ICD-10-CM | POA: Diagnosis present

## 2019-10-04 DIAGNOSIS — Z7982 Long term (current) use of aspirin: Secondary | ICD-10-CM

## 2019-10-04 DIAGNOSIS — I2510 Atherosclerotic heart disease of native coronary artery without angina pectoris: Secondary | ICD-10-CM | POA: Diagnosis present

## 2019-10-04 DIAGNOSIS — K219 Gastro-esophageal reflux disease without esophagitis: Secondary | ICD-10-CM | POA: Diagnosis present

## 2019-10-04 DIAGNOSIS — K5792 Diverticulitis of intestine, part unspecified, without perforation or abscess without bleeding: Secondary | ICD-10-CM | POA: Diagnosis present

## 2019-10-04 DIAGNOSIS — Z880 Allergy status to penicillin: Secondary | ICD-10-CM

## 2019-10-04 DIAGNOSIS — I252 Old myocardial infarction: Secondary | ICD-10-CM | POA: Diagnosis not present

## 2019-10-04 DIAGNOSIS — I5041 Acute combined systolic (congestive) and diastolic (congestive) heart failure: Secondary | ICD-10-CM | POA: Diagnosis not present

## 2019-10-04 DIAGNOSIS — I255 Ischemic cardiomyopathy: Secondary | ICD-10-CM | POA: Diagnosis present

## 2019-10-04 DIAGNOSIS — Z20822 Contact with and (suspected) exposure to covid-19: Secondary | ICD-10-CM | POA: Diagnosis present

## 2019-10-04 DIAGNOSIS — Z96653 Presence of artificial knee joint, bilateral: Secondary | ICD-10-CM | POA: Diagnosis present

## 2019-10-04 DIAGNOSIS — J449 Chronic obstructive pulmonary disease, unspecified: Secondary | ICD-10-CM | POA: Diagnosis present

## 2019-10-04 DIAGNOSIS — D631 Anemia in chronic kidney disease: Secondary | ICD-10-CM | POA: Diagnosis present

## 2019-10-04 DIAGNOSIS — Z7901 Long term (current) use of anticoagulants: Secondary | ICD-10-CM

## 2019-10-04 DIAGNOSIS — Z955 Presence of coronary angioplasty implant and graft: Secondary | ICD-10-CM

## 2019-10-04 DIAGNOSIS — Z8249 Family history of ischemic heart disease and other diseases of the circulatory system: Secondary | ICD-10-CM

## 2019-10-04 DIAGNOSIS — I5021 Acute systolic (congestive) heart failure: Secondary | ICD-10-CM | POA: Diagnosis not present

## 2019-10-04 DIAGNOSIS — K921 Melena: Secondary | ICD-10-CM | POA: Diagnosis present

## 2019-10-04 DIAGNOSIS — F419 Anxiety disorder, unspecified: Secondary | ICD-10-CM | POA: Diagnosis present

## 2019-10-04 DIAGNOSIS — I4819 Other persistent atrial fibrillation: Secondary | ICD-10-CM | POA: Diagnosis present

## 2019-10-04 DIAGNOSIS — I5033 Acute on chronic diastolic (congestive) heart failure: Secondary | ICD-10-CM | POA: Diagnosis not present

## 2019-10-04 DIAGNOSIS — R0602 Shortness of breath: Secondary | ICD-10-CM | POA: Diagnosis present

## 2019-10-04 DIAGNOSIS — E669 Obesity, unspecified: Secondary | ICD-10-CM | POA: Diagnosis present

## 2019-10-04 DIAGNOSIS — F329 Major depressive disorder, single episode, unspecified: Secondary | ICD-10-CM | POA: Diagnosis present

## 2019-10-04 DIAGNOSIS — N179 Acute kidney failure, unspecified: Secondary | ICD-10-CM | POA: Diagnosis present

## 2019-10-04 DIAGNOSIS — I459 Conduction disorder, unspecified: Secondary | ICD-10-CM | POA: Diagnosis present

## 2019-10-04 DIAGNOSIS — E1142 Type 2 diabetes mellitus with diabetic polyneuropathy: Secondary | ICD-10-CM | POA: Diagnosis present

## 2019-10-04 DIAGNOSIS — E785 Hyperlipidemia, unspecified: Secondary | ICD-10-CM | POA: Diagnosis present

## 2019-10-04 DIAGNOSIS — Z833 Family history of diabetes mellitus: Secondary | ICD-10-CM

## 2019-10-04 DIAGNOSIS — D62 Acute posthemorrhagic anemia: Secondary | ICD-10-CM | POA: Diagnosis not present

## 2019-10-04 DIAGNOSIS — I10 Essential (primary) hypertension: Secondary | ICD-10-CM | POA: Diagnosis not present

## 2019-10-04 DIAGNOSIS — Z882 Allergy status to sulfonamides status: Secondary | ICD-10-CM

## 2019-10-04 DIAGNOSIS — N1832 Chronic kidney disease, stage 3b: Secondary | ICD-10-CM | POA: Diagnosis present

## 2019-10-04 DIAGNOSIS — Z66 Do not resuscitate: Secondary | ICD-10-CM | POA: Diagnosis present

## 2019-10-04 DIAGNOSIS — I495 Sick sinus syndrome: Secondary | ICD-10-CM | POA: Diagnosis present

## 2019-10-04 DIAGNOSIS — K922 Gastrointestinal hemorrhage, unspecified: Secondary | ICD-10-CM | POA: Diagnosis not present

## 2019-10-04 DIAGNOSIS — E039 Hypothyroidism, unspecified: Secondary | ICD-10-CM | POA: Diagnosis present

## 2019-10-04 DIAGNOSIS — Z8261 Family history of arthritis: Secondary | ICD-10-CM

## 2019-10-04 DIAGNOSIS — Z79899 Other long term (current) drug therapy: Secondary | ICD-10-CM

## 2019-10-04 DIAGNOSIS — I700 Atherosclerosis of aorta: Secondary | ICD-10-CM | POA: Diagnosis present

## 2019-10-04 DIAGNOSIS — E1122 Type 2 diabetes mellitus with diabetic chronic kidney disease: Secondary | ICD-10-CM | POA: Diagnosis present

## 2019-10-04 DIAGNOSIS — I130 Hypertensive heart and chronic kidney disease with heart failure and stage 1 through stage 4 chronic kidney disease, or unspecified chronic kidney disease: Secondary | ICD-10-CM | POA: Diagnosis present

## 2019-10-04 DIAGNOSIS — E876 Hypokalemia: Secondary | ICD-10-CM | POA: Diagnosis not present

## 2019-10-04 DIAGNOSIS — I5043 Acute on chronic combined systolic (congestive) and diastolic (congestive) heart failure: Secondary | ICD-10-CM

## 2019-10-04 DIAGNOSIS — E875 Hyperkalemia: Secondary | ICD-10-CM | POA: Diagnosis not present

## 2019-10-04 DIAGNOSIS — N183 Chronic kidney disease, stage 3 unspecified: Secondary | ICD-10-CM | POA: Diagnosis not present

## 2019-10-04 DIAGNOSIS — Z87891 Personal history of nicotine dependence: Secondary | ICD-10-CM

## 2019-10-04 DIAGNOSIS — I480 Paroxysmal atrial fibrillation: Secondary | ICD-10-CM | POA: Diagnosis not present

## 2019-10-04 DIAGNOSIS — Z888 Allergy status to other drugs, medicaments and biological substances status: Secondary | ICD-10-CM

## 2019-10-04 DIAGNOSIS — D6489 Other specified anemias: Secondary | ICD-10-CM | POA: Diagnosis not present

## 2019-10-04 DIAGNOSIS — Z801 Family history of malignant neoplasm of trachea, bronchus and lung: Secondary | ICD-10-CM

## 2019-10-04 DIAGNOSIS — Z803 Family history of malignant neoplasm of breast: Secondary | ICD-10-CM

## 2019-10-04 HISTORY — DX: Ischemic cardiomyopathy: I25.5

## 2019-10-04 HISTORY — DX: Unspecified diastolic (congestive) heart failure: I50.30

## 2019-10-04 LAB — BASIC METABOLIC PANEL
Anion gap: 10 (ref 5–15)
BUN: 25 mg/dL — ABNORMAL HIGH (ref 8–23)
CO2: 29 mmol/L (ref 22–32)
Calcium: 10.4 mg/dL — ABNORMAL HIGH (ref 8.9–10.3)
Chloride: 107 mmol/L (ref 98–111)
Creatinine, Ser: 1.31 mg/dL — ABNORMAL HIGH (ref 0.44–1.00)
GFR calc Af Amer: 42 mL/min — ABNORMAL LOW (ref >60)
GFR calc non Af Amer: 36 mL/min — ABNORMAL LOW (ref >60)
Glucose, Bld: 138 mg/dL — ABNORMAL HIGH (ref 70–99)
Potassium: 4.1 mmol/L (ref 3.5–5.1)
Sodium: 146 mmol/L — ABNORMAL HIGH (ref 135–145)

## 2019-10-04 LAB — CBC
HCT: 31.1 % — ABNORMAL LOW (ref 36.0–46.0)
Hemoglobin: 9.4 g/dL — ABNORMAL LOW (ref 12.0–15.0)
MCH: 29.2 pg (ref 26.0–34.0)
MCHC: 30.2 g/dL (ref 30.0–36.0)
MCV: 96.6 fL (ref 80.0–100.0)
Platelets: 265 10*3/uL (ref 150–400)
RBC: 3.22 MIL/uL — ABNORMAL LOW (ref 3.87–5.11)
RDW: 15.6 % — ABNORMAL HIGH (ref 11.5–15.5)
WBC: 5.3 10*3/uL (ref 4.0–10.5)
nRBC: 0 % (ref 0.0–0.2)

## 2019-10-04 LAB — MAGNESIUM: Magnesium: 2 mg/dL (ref 1.7–2.4)

## 2019-10-04 LAB — SARS CORONAVIRUS 2 BY RT PCR (HOSPITAL ORDER, PERFORMED IN ~~LOC~~ HOSPITAL LAB): SARS Coronavirus 2: NEGATIVE

## 2019-10-04 LAB — TROPONIN I (HIGH SENSITIVITY)
Troponin I (High Sensitivity): 13 ng/L (ref <18)
Troponin I (High Sensitivity): 24 ng/L — ABNORMAL HIGH (ref <18)

## 2019-10-04 LAB — TSH: TSH: 3.163 u[IU]/mL (ref 0.350–4.500)

## 2019-10-04 LAB — BRAIN NATRIURETIC PEPTIDE: B Natriuretic Peptide: 259 pg/mL — ABNORMAL HIGH (ref 0.0–100.0)

## 2019-10-04 LAB — PROCALCITONIN: Procalcitonin: <0.1 ng/mL

## 2019-10-04 MED ORDER — FUROSEMIDE 10 MG/ML IJ SOLN
60.0000 mg | Freq: Once | INTRAMUSCULAR | Status: AC
  Administered 2019-10-04: 60 mg via INTRAVENOUS
  Filled 2019-10-04: qty 8

## 2019-10-04 MED ORDER — DULOXETINE HCL 30 MG PO CPEP
30.0000 mg | ORAL_CAPSULE | Freq: Every day | ORAL | Status: DC
  Administered 2019-10-05 – 2019-10-11 (×7): 30 mg via ORAL
  Filled 2019-10-04 (×8): qty 1

## 2019-10-04 MED ORDER — PANTOPRAZOLE SODIUM 20 MG PO TBEC: 20.0000 mg | DELAYED_RELEASE_TABLET | Freq: Every day | ORAL | Status: DC

## 2019-10-04 MED ORDER — ENOXAPARIN SODIUM 30 MG/0.3ML ~~LOC~~ SOLN: 30.0000 mg | SUBCUTANEOUS | Status: DC

## 2019-10-04 MED ORDER — ASPIRIN EC 81 MG PO TBEC: 81.0000 mg | DELAYED_RELEASE_TABLET | Freq: Every day | ORAL | Status: DC

## 2019-10-04 MED ORDER — DIGOXIN 0.1 MG/ML IJ SOLN
0.2500 mg | Freq: Once | INTRAMUSCULAR | Status: DC
  Filled 2019-10-04: qty 3

## 2019-10-04 MED ORDER — SENNOSIDES-DOCUSATE SODIUM 8.6-50 MG PO TABS: 1.0000 | ORAL_TABLET | Freq: Every evening | ORAL | Status: DC | PRN

## 2019-10-04 MED ORDER — METOPROLOL SUCCINATE ER 25 MG PO TB24
12.5000 mg | ORAL_TABLET | Freq: Every day | ORAL | Status: DC
  Administered 2019-10-04 – 2019-10-05 (×2): 12.5 mg via ORAL
  Filled 2019-10-04 (×2): qty 0.5

## 2019-10-04 MED ORDER — DIGOXIN 0.25 MG/ML IJ SOLN
0.2500 mg | Freq: Once | INTRAMUSCULAR | Status: AC
  Administered 2019-10-05: 0.25 mg via INTRAVENOUS
  Filled 2019-10-04: qty 2

## 2019-10-04 MED ORDER — ALPRAZOLAM 0.25 MG PO TABS
0.2500 mg | ORAL_TABLET | Freq: Two times a day (BID) | ORAL | Status: DC | PRN
  Administered 2019-10-05: 0.25 mg via ORAL
  Filled 2019-10-04: qty 1

## 2019-10-04 MED ORDER — APIXABAN 2.5 MG PO TABS
2.5000 mg | ORAL_TABLET | Freq: Two times a day (BID) | ORAL | Status: DC
  Administered 2019-10-04: 2.5 mg via ORAL
  Filled 2019-10-04 (×2): qty 1

## 2019-10-04 MED ORDER — ENOXAPARIN SODIUM 40 MG/0.4ML ~~LOC~~ SOLN: 40.0000 mg | SUBCUTANEOUS | Status: DC

## 2019-10-04 MED ORDER — VITAMIN D 25 MCG (1000 UNIT) PO TABS
1000.0000 [IU] | ORAL_TABLET | Freq: Every day | ORAL | Status: DC
  Administered 2019-10-05 – 2019-10-11 (×7): 1000 [IU] via ORAL
  Filled 2019-10-04 (×7): qty 1

## 2019-10-04 MED ORDER — ZOLPIDEM TARTRATE 5 MG PO TABS: 5.0000 mg | ORAL_TABLET | Freq: Every evening | ORAL | Status: DC | PRN

## 2019-10-04 MED ORDER — AMIODARONE HCL 100 MG PO TABS
100.0000 mg | ORAL_TABLET | Freq: Every day | ORAL | Status: DC
  Administered 2019-10-04 – 2019-10-05 (×2): 100 mg via ORAL
  Filled 2019-10-04 (×3): qty 1

## 2019-10-04 MED ORDER — HYDROCODONE-ACETAMINOPHEN 10-325 MG PO TABS
1.0000 | ORAL_TABLET | ORAL | Status: DC | PRN
  Administered 2019-10-09: 1 via ORAL
  Filled 2019-10-04: qty 1

## 2019-10-04 MED ORDER — SODIUM CHLORIDE 0.9% FLUSH
3.0000 mL | Freq: Two times a day (BID) | INTRAVENOUS | Status: DC
  Administered 2019-10-05 – 2019-10-10 (×11): 3 mL via INTRAVENOUS

## 2019-10-04 MED ORDER — PANTOPRAZOLE SODIUM 40 MG IV SOLR
40.0000 mg | Freq: Two times a day (BID) | INTRAVENOUS | Status: DC
  Administered 2019-10-05 – 2019-10-07 (×5): 40 mg via INTRAVENOUS
  Filled 2019-10-04 (×5): qty 40

## 2019-10-04 MED ORDER — ROSUVASTATIN CALCIUM 20 MG PO TABS: 40.0000 mg | ORAL_TABLET | Freq: Every day | ORAL | Status: DC

## 2019-10-04 MED ORDER — DULOXETINE HCL 30 MG PO CPEP: 30.0000 mg | ORAL_CAPSULE | Freq: Every day | ORAL | Status: DC

## 2019-10-04 MED ORDER — ONDANSETRON HCL 4 MG/2ML IJ SOLN: 4.0000 mg | Freq: Four times a day (QID) | INTRAMUSCULAR | Status: DC | PRN

## 2019-10-04 MED ORDER — DILTIAZEM HCL 25 MG/5ML IV SOLN: 5.0000 mg | Freq: Once | INTRAVENOUS | Status: DC

## 2019-10-04 MED ORDER — ACETAMINOPHEN 325 MG PO TABS
650.0000 mg | ORAL_TABLET | ORAL | Status: DC | PRN
  Administered 2019-10-08: 650 mg via ORAL
  Filled 2019-10-04: qty 2

## 2019-10-04 MED ORDER — SODIUM CHLORIDE 0.9 % IV SOLN: 250.0000 mL | INTRAVENOUS | Status: DC | PRN

## 2019-10-04 MED ORDER — CYCLOBENZAPRINE HCL 10 MG PO TABS
5.0000 mg | ORAL_TABLET | Freq: Three times a day (TID) | ORAL | Status: DC | PRN
  Administered 2019-10-08: 5 mg via ORAL
  Filled 2019-10-04: qty 1

## 2019-10-04 MED ORDER — ALBUTEROL SULFATE (2.5 MG/3ML) 0.083% IN NEBU: 2.5000 mg | INHALATION_SOLUTION | Freq: Four times a day (QID) | RESPIRATORY_TRACT | Status: DC | PRN

## 2019-10-04 MED ORDER — ROSUVASTATIN CALCIUM 10 MG PO TABS
40.0000 mg | ORAL_TABLET | Freq: Every day | ORAL | Status: DC
  Administered 2019-10-05 – 2019-10-10 (×6): 40 mg via ORAL
  Filled 2019-10-04 (×4): qty 4
  Filled 2019-10-04: qty 2
  Filled 2019-10-04: qty 4

## 2019-10-04 MED ORDER — IPRATROPIUM-ALBUTEROL 0.5-2.5 (3) MG/3ML IN SOLN: 3.0000 mL | RESPIRATORY_TRACT | Status: DC | PRN

## 2019-10-04 MED ORDER — POTASSIUM CHLORIDE CRYS ER 20 MEQ PO TBCR
20.0000 meq | EXTENDED_RELEASE_TABLET | Freq: Every day | ORAL | Status: DC
  Administered 2019-10-05: 20 meq via ORAL
  Filled 2019-10-04: qty 1

## 2019-10-04 MED ORDER — SODIUM CHLORIDE 0.9% FLUSH: 3.0000 mL | INTRAVENOUS | Status: DC | PRN

## 2019-10-04 MED ORDER — TRAZODONE HCL 100 MG PO TABS
100.0000 mg | ORAL_TABLET | Freq: Every day | ORAL | Status: DC
  Administered 2019-10-04 – 2019-10-10 (×7): 100 mg via ORAL
  Filled 2019-10-04 (×7): qty 1

## 2019-10-04 MED ORDER — FUROSEMIDE 10 MG/ML IJ SOLN
40.0000 mg | Freq: Two times a day (BID) | INTRAMUSCULAR | Status: DC
  Administered 2019-10-05 – 2019-10-06 (×4): 40 mg via INTRAVENOUS
  Filled 2019-10-04 (×4): qty 4

## 2019-10-04 NOTE — ED Notes (Signed)
Pacemaker interrogated using [employer]

## 2019-10-04 NOTE — ED Notes (Signed)
Dr Sidney Ace messaged via secure chat regarding pt afib 90-115. Awaiting response

## 2019-10-04 NOTE — ED Notes (Signed)
This RN watching monitor. Pt having small 2-3 runs of VTACH. Pt then started having 5-6 runs of VTACH. MD made aware.

## 2019-10-04 NOTE — ED Notes (Signed)
Wallowa, Flanagan Provider called to check on patient  If questions call Claudia Desanctis [phone] ask for her provider

## 2019-10-04 NOTE — H&P (Addendum)
[place]   PATIENT NAME: Evelyn Simmons    MR#:  [id number]  DATE OF BIRTH:  1929-09-06  DATE OF ADMISSION:  10/04/2019  PRIMARY CARE PHYSICIAN: She[place]   REQUESTING/REFERRING PHYSICIAN: Nance Pear, MD  CHIEF COMPLAINT:   Chief Complaint  Patient presents with  . Shortness of Breath    HISTORY OF PRESENT ILLNESS:  Evelyn Simmons  is a 86 y.o. Caucasian female with a known history of COPD and chronic respiratory failure on home O2 at 3 L/min, type diabetes mellitus, systolic and diastolic CHF, atrial fibrillation, status post pacemaker, dyslipidemia, hypertension and hypothyroidism, who presented to the emergency room with acute onset of worsening dyspnea over the last month and significantly worse over the last few days.  She came to the emergency room a couple of days ago and left before being seen.  She has been having chest congestion but denies any cough.  She was concerned about having fluid overload.  No fever chills.  No nausea vomiting or abdominal pain or diarrhea.  She admits to 3 pillow orthopnea and paroxysmal nocturnal dyspnea.  She denies any significant lower extremity edema.  She admits to dry cough without wheezing or hemoptysis.  No chest pain however she has been having palpitations.  No dysuria, oliguria or hematuria or flank pain.  She has been having dark stools and has noted bright red bleeding per rectum and mentions a history of diverticulosis and diverticulitis.  She had negative guaiac no.  Upon presentation to the ER, heart rate was 112 with respiratory rate of 24 and pulse oximetry 92% on room air with a blood pressure 180/63 and temperature 98.3.  Labs revealed a BUN of 25 and creatinine 1.35 compared to 25 and 1.592 days ago, BNP of 259 up from 233 then, high-sensitivity troponin I of 13 and later 24 and procalcitonin less than 0.1.  CBC showed anemia with hemoglobin of 9.4 hematocrit 31.1 close to her level 2 days ago however  compared to 11 and 34.2 on 09/17/2019.  COVID-19 PCR is currently pending.  Showed her pacemaker attached to the right atrium and right ventricle, persistent cardiomegaly with pulmonary vascular congestion and trace bilateral pleural effusions with suspected underlying CHF as well as an airspace opacity in the left lower lobe concerning for superimposed developing pneumonia.  It showed aortic atherosclerosis. EKG showed atrial fibrillation with a ventricular sponsor 126 and later 120 with nonspecific intraventricular conduction delay with left axis deviation, LVH and taste beats and triplets on her second EKG.  The patient's most recent 2D echo revealed an EF of 45 to 50% with mildly diminished systolic function, mild concentric LVH and inability to detect diastolic function, severe left atrial dilatation and mild right atrial dilatation.  The patient had dark stool but stool Hemoccult came back negative as above.  The patient was given 60 mg of IV Lasix.  She will be admitted to a progressive unit bed for further evaluation and management. PAST MEDICAL HISTORY:   Past Medical History:  Diagnosis Date  . Arthritis   . Asthma   . CAD (coronary artery disease)   . CHF (congestive heart failure) ([place])   . COPD (chronic obstructive pulmonary disease) ([place])   . Depression   . Diabetes mellitus without complication ([place])   . Diastolic heart failure ([place])   . Diverticulitis   . GERD (gastroesophageal reflux disease)   . Heart disease   . Hyperlipidemia   . Hypertension   .  Hypothyroid    pt unsure, not currently on medication  . MI (myocardial infarction) ([place])    1995  . Persistent atrial fibrillation ([place])   . Radiculopathy     PAST SURGICAL HISTORY:   Past Surgical History:  Procedure Laterality Date  . ABDOMINAL HYSTERECTOMY    . APPENDECTOMY    . CARDIAC CATHETERIZATION    . CORONARY ANGIOPLASTY WITH STENT PLACEMENT  12/11/1993   RCA  . FOOT SURGERY Bilateral    Over 25 years  ago- Bunionectomy  . INSERT / REPLACE / REMOVE PACEMAKER  09/2012   Model # G269  serial # [id number]  . JOINT REPLACEMENT     bilateral knee replacements    SOCIAL HISTORY:   Social History   Tobacco Use  . Smoking status: Former Smoker    Quit date: 12/10/1993    Years since quitting: 25.8  . Smokeless tobacco: Former Network engineer Use Topics  . Alcohol use: Yes    Alcohol/week: 0.0 standard drinks    Comment: Rare    FAMILY HISTORY:   Family History  Problem Relation Age of Onset  . Arthritis Mother   . Heart disease Mother   . Hypertension Mother   . Diabetes Mother   . Arthritis Father   . Heart disease Father   . Hypertension Father   . Cancer Sister        breast cancer  . Diabetes Brother   . Cancer Daughter        lung cancer  . Diabetes Sister     DRUG ALLERGIES:   Allergies  Allergen Reactions  . Other Other (See Comments)  . Sulfa Antibiotics     Pt unsure reaction, was told in hospital she had allergy  . Penicillin G Rash  . Penicillins Nausea And Vomiting and Rash    REVIEW OF SYSTEMS:   ROS As per history of present illness. All pertinent systems were reviewed above. Constitutional, HEENT, cardiovascular, respiratory, GI, GU, musculoskeletal, neuro, psychiatric, endocrine, integumentary and hematologic systems were reviewed and are otherwise negative/unremarkable except for positive findings mentioned above in the HPI.   MEDICATIONS AT HOME:   Prior to Admission medications   Medication Sig Start Date End Date Taking? Authorizing Provider  albuterol (PROVENTIL HFA;VENTOLIN HFA) 108 (90 Base) MCG/ACT inhaler Inhale 2 puffs into the lungs every 6 (six) hours as needed for wheezing or shortness of breath.    [provider]  amiodarone (PACERONE) 100 MG tablet Take 1 tablet (100 mg total) by mouth daily. 08/12/17   Deboraha Sprang, MD  aspirin EC 81 MG tablet Take 81 mg by mouth daily.    [provider]  Blood Glucose  Monitoring Suppl (ACCU-CHEK NANO SMARTVIEW) W/DEVICE KIT Please issue nano meter by AccuChek along with test strips at checking frequency of 2x daily. 90 day supply. E11.9 05/13/14   Phadke, Karsten Ro, MD  cholecalciferol (VITAMIN D) 1000 units tablet Take 1,000 Units by mouth daily.    [provider]  cyclobenzaprine (FLEXERIL) 5 MG tablet Take 5 mg by mouth 3 (three) times daily as needed for muscle spasms.    [provider]  DULoxetine (CYMBALTA) 30 MG capsule Take 30 mg by mouth daily.    [provider]  furosemide (LASIX) 40 MG tablet Two tabs twice a day for one day then one tablet twice a day afterwards 09/19/19   Loletha Grayer, MD  glucose blood (FREESTYLE LITE) test strip Use as instructed 05/30/14  Rubbie Battiest, RN  HYDROcodone-acetaminophen (NORCO) 10-325 MG per tablet Take 1 tablet by mouth every 4 (four) hours as needed. 05/23/14   Rubbie Battiest, RN  ipratropium-albuterol (DUONEB) 0.5-2.5 (3) MG/3ML SOLN Take 3 mLs by nebulization.    [provider]  Iron-Vitamin C (VITRON-C PO) Take by mouth. Take one tablet by mouth twice daily on Monday/ Wednesday/ Friday    [provider]  Lancets (FREESTYLE) lancets Use as instructed 05/30/14   Rubbie Battiest, RN  liraglutide (VICTOZA) 18 MG/3ML SOPN Inject into the skin.    [provider]  metoprolol succinate (TOPROL-XL) 25 MG 24 hr tablet Take 0.5 tablets (12.5 mg total) by mouth daily. 09/19/19   Loletha Grayer, MD  pantoprazole (PROTONIX) 20 MG tablet Take 20 mg by mouth daily.    [provider]  Potassium Chloride ER 20 MEQ TBCR One tablet twice a day for one day then one tablet daily afterwards 09/19/19   Loletha Grayer, MD  rosuvastatin (CRESTOR) 40 MG tablet Take 1 tablet (40 mg total) by mouth daily. 05/09/14   Minna Merritts, MD  Sennosides-Docusate Sodium (SENEXON-S PO) Take by mouth. Take two tablets by mouth once daily    [provider]  traZODone  (DESYREL) 100 MG tablet Take 100 mg by mouth at bedtime.    [provider]      VITAL SIGNS:  Blood pressure 130/77, pulse (!) 107, temperature 98.4 F (36.9 C), temperature source Oral, resp. rate 19, height _0  (1.499 m), weight 79.4 kg, SpO2 99 %.  PHYSICAL EXAMINATION:  Physical Exam  GENERAL:  86 y.o.-year-old Caucasian female patient lying in the bed with no acute distress.  EYES: Pupils equal, round, reactive to light and accommodation. No scleral icterus. Extraocular muscles intact.  HEENT: Head atraumatic, normocephalic. Oropharynx and nasopharynx clear.  NECK:  Supple, no jugular venous distention. No thyroid enlargement, no tenderness.  LUNGS: Diminished bibasal breath sounds with bibasal rales. CARDIOVASCULAR: Regular rate and rhythm, S1, S2 normal. No murmurs, rubs, or gallops.  ABDOMEN: Soft, nondistended, nontender. Bowel sounds present. No organomegaly or mass.  EXTREMITIES: Trace bilateral lower extremity pitting edema with no cyanosis, or clubbing.  NEUROLOGIC: Cranial nerves II through XII are intact. Muscle strength 5/5 in all extremities. Sensation intact. Gait not checked.  PSYCHIATRIC: The patient is alert and oriented x 3.  Normal affect and good eye contact. SKIN: No obvious rash, lesion, or ulcer.   LABORATORY PANEL:   CBC Recent Labs  Lab 10/04/19 0935  WBC 5.3  HGB 9.4*  HCT 31.1*  PLT 265   ------------------------------------------------------------------------------------------------------------------  Chemistries  Recent Labs  Lab 10/02/19 1032 10/02/19 1032 10/04/19 0935  NA 140   < > 146*  K 4.3   < > 4.1  CL 102   < > 107  CO2 28   < > 29  GLUCOSE 123*   < > 138*  BUN 25*   < > 25*  CREATININE 1.59*   < > 1.31*  CALCIUM 10.1   < > 10.4*  AST 47*  --   --   ALT 41  --   --   ALKPHOS 65  --   --   BILITOT 0.8  --   --    < > = values in this interval not displayed.    ------------------------------------------------------------------------------------------------------------------  Cardiac Enzymes No results for input(s): TROPONINI in the last 168 hours. ------------------------------------------------------------------------------------------------------------------  RADIOLOGY:  DG Chest 2 View  Result Date:  10/04/2019 CLINICAL DATA:  Shortness of breath with chest congestion EXAM: CHEST - 2 VIEW COMPARISON:  October 02, 2019 FINDINGS: There is ill-defined airspace opacity in the left lower lung region. There is a minimal pleural effusion on each side. No appreciable interstitial edema. There is cardiomegaly with mild pulmonary venous hypertension. Pacemaker leads are attached to the right atrium and right ventricle. There is aortic atherosclerosis. No adenopathy. No bone lesions. IMPRESSION: Persistent cardiomegaly with pulmonary vascular congestion. Trace pleural effusions bilaterally. There may be a degree of underlying congestive heart failure. There is airspace opacity in the left lower lung region concerning for superimposed developing pneumonia. Pacemaker leads attached to right atrium and right ventricle. Aortic Atherosclerosis (ICD10-I70.0). Electronically Signed   By: Lowella Grip III M.D.   On: 10/04/2019 10:08      IMPRESSION AND PLAN:   1.  Acute on chronic systolic and likely diastolic CHF. -The patient will be admitted to a progressive unit bed. -We will continue diuresis with IV Lasix. -We will follow serial troponin I's. -We will monitor I's and O's. -She had a very recent echo and therefore will not repeat it. -We will obtain a cardiology consultation in a.m. with [facility]. -I notified Dr. Dorris Carnes about the patient.  2.  Paroxysmal atrial fibrillation with intermittent rapid ventricular response.   The patient's CHA2DS2-VASc score is 6. -The patient will be continued on amiodarone and will utilize IV drip as needed. -We will  utilize IV digoxin as needed given borderline blood pressure.  3.  GI bleeding, likely of lower GI etiology, possibly due to diverticulosis and likely contributing to worsening/acute on chronic blood loss anemia. One cannot rule out upper GI etiology as well. -We will follow serial hemoglobins and hematocrits. -We will hold aspirin and avoid anticoagulants at this time. -We will place her on IV Protonix 40 mg every 12 hours. -We will obtain a GI consultation. -I notified Dr. Marius Ditch about the patient  4.  Essential hypertension. -We will continue Norvasc.  5.  Depression and anxiety. -We will continue Cymbalta and Seroquel as well as Xanax.  6.  Type II diabetes mellitus with peripheral neuropathy. -We will place her on supplemental coverage with NovoLog and continue Neurontin. -Basal coverage will be continued.  7.  Dyslipidemia. -Statin therapy will be resumed.  8.  DVT prophylaxis. -We will place her on Eliquis.  All the records are reviewed and case discussed with ED provider. The plan of care was discussed in details with the patient (and family). I answered all questions. The patient agreed to proceed with the above mentioned plan. Further management will depend upon hospital course.   CODE STATUS: Full code  Status is: Inpatient  Remains inpatient appropriate because:Ongoing diagnostic testing needed not appropriate for outpatient work up, Unsafe d/c plan, IV treatments appropriate due to intensity of illness or inability to take PO and Inpatient level of care appropriate due to severity of illness   Dispo: The patient is from: Home              Anticipated d/c is to: Home              Anticipated d/c date is: 2 days              Patient currently is not medically stable to d/c.   TOTAL TIME TAKING CARE OF THIS PATIENT: 60 minutes.    Christel Mormon M.D on 10/04/2019 at 7:40 PM  [facility]   From  7 PM-7 AM, contact night-coverage [email or web address]  CC:  Primary care physician; [place]   Note: This dictation was prepared with Diplomatic Services operational officer dictation along with smaller phrase technology. Any transcriptional typo errors that result from this process are unintentional.

## 2019-10-04 NOTE — ED Notes (Signed)
This RN attempted IV access, no success. IV ultrasounded by Marya Amsler RN

## 2019-10-04 NOTE — ED Notes (Signed)
See triage note, c/o SHOB "the whole month of august". Wears 2L [place] at home, 100% on 2 L [place]. Speaking in complete sentences

## 2019-10-04 NOTE — ED Notes (Signed)
Pharmacy contacted for missing med

## 2019-10-04 NOTE — ED Triage Notes (Signed)
Pt c/o SOB with sinus and chest congestion since she was d/c on 8/8 with fluid on her lungs. Pt is tachypnic on arrival

## 2019-10-04 NOTE — ED Provider Notes (Signed)
[facility] Emergency Department Provider Note  ____________________________________________   I have reviewed the triage vital signs and the nursing notes.   HISTORY  Chief Complaint Shortness of Breath   History limited by: Not Limited   HPI Evelyn Simmons is a 86 y.o. female who presents to the emergency department today because of concern for shortness of breath. The patient states that these symptoms have been getting progressively worse over the past few days. She did come to the emergency department two days ago for the symptoms but left before being seen. The patient has a feeling of congestion in the middle of her chest but denies any true chest pain. The patient denies any cough. Denies any fever. Did have recent hospitalization due to fluid overload and is concerned that she might be fluid overloaded again.   Records reviewed. Per medical record review patient has a history of recent hospitalization with daignoses of CHF, afib with rvr, respiratory failure.  Past Medical History:  Diagnosis Date  . Arthritis   . Asthma   . CAD (coronary artery disease)   . CHF (congestive heart failure) ([place])   . COPD (chronic obstructive pulmonary disease) ([place])   . Depression   . Diabetes mellitus without complication ([place])   . Diastolic heart failure ([place])   . Diverticulitis   . GERD (gastroesophageal reflux disease)   . Heart disease   . Hyperlipidemia   . Hypertension   . Hypothyroid    pt unsure, not currently on medication  . MI (myocardial infarction) ([place])    1995  . Persistent atrial fibrillation ([place])   . Radiculopathy     Patient Active Problem List   Diagnosis Date Noted  . Chronic respiratory failure with hypoxia ([place])   . CHF (congestive heart failure) ([place]) 09/18/2019  . Atrial fibrillation with rapid ventricular response ([place]) 09/17/2019  . Acute diastolic CHF (congestive heart failure) ([place]) 09/17/2019  . Mobility impaired  05/29/2014  . CAD (coronary artery disease) 05/09/2014  . History of coronary artery stent placement 05/09/2014  . Hyperlipidemia 05/09/2014  . Bilateral low back pain with left-sided sciatica 04/21/2014  . Knee effusion 04/21/2014  . CKD stage 3 due to type 2 diabetes mellitus ([place]) 04/21/2014  . Pacemaker 04/21/2014  . Encounter to establish care 04/20/2014    Past Surgical History:  Procedure Laterality Date  . ABDOMINAL HYSTERECTOMY    . APPENDECTOMY    . CARDIAC CATHETERIZATION    . CORONARY ANGIOPLASTY WITH STENT PLACEMENT  12/11/1993   RCA  . FOOT SURGERY Bilateral    Over 25 years ago- Bunionectomy  . INSERT / REPLACE / REMOVE PACEMAKER  09/2012   Model # Y694  serial # [id number]  . JOINT REPLACEMENT     bilateral knee replacements    Prior to Admission medications   Medication Sig Start Date End Date Taking? Authorizing Provider  albuterol (PROVENTIL HFA;VENTOLIN HFA) 108 (90 Base) MCG/ACT inhaler Inhale 2 puffs into the lungs every 6 (six) hours as needed for wheezing or shortness of breath.    [provider]  amiodarone (PACERONE) 100 MG tablet Take 1 tablet (100 mg total) by mouth daily. 08/12/17   Deboraha Sprang, MD  aspirin EC 81 MG tablet Take 81 mg by mouth daily.    [provider]  Blood Glucose Monitoring Suppl (ACCU-CHEK NANO SMARTVIEW) W/DEVICE KIT Please issue nano meter by AccuChek along with test strips at checking frequency of 2x daily. 90 day supply. E11.9 05/13/14  Phadke, Karsten Ro, MD  cholecalciferol (VITAMIN D) 1000 units tablet Take 1,000 Units by mouth daily.    [provider]  cyclobenzaprine (FLEXERIL) 5 MG tablet Take 5 mg by mouth 3 (three) times daily as needed for muscle spasms.    [provider]  DULoxetine (CYMBALTA) 30 MG capsule Take 30 mg by mouth daily.    [provider]  furosemide (LASIX) 40 MG tablet Two tabs twice a day for one day then one tablet twice a day afterwards 09/19/19   Loletha Grayer, MD  glucose blood (FREESTYLE LITE) test strip Use as instructed 05/30/14   Rubbie Battiest, RN  HYDROcodone-acetaminophen (NORCO) 10-325 MG per tablet Take 1 tablet by mouth every 4 (four) hours as needed. 05/23/14   Rubbie Battiest, RN  ipratropium-albuterol (DUONEB) 0.5-2.5 (3) MG/3ML SOLN Take 3 mLs by nebulization.    [provider]  Iron-Vitamin C (VITRON-C PO) Take by mouth. Take one tablet by mouth twice daily on Monday/ Wednesday/ Friday    [provider]  Lancets (FREESTYLE) lancets Use as instructed 05/30/14   Rubbie Battiest, RN  liraglutide (VICTOZA) 18 MG/3ML SOPN Inject into the skin.    [provider]  metoprolol succinate (TOPROL-XL) 25 MG 24 hr tablet Take 0.5 tablets (12.5 mg total) by mouth daily. 09/19/19   Loletha Grayer, MD  pantoprazole (PROTONIX) 20 MG tablet Take 20 mg by mouth daily.    [provider]  Potassium Chloride ER 20 MEQ TBCR One tablet twice a day for one day then one tablet daily afterwards 09/19/19   Loletha Grayer, MD  rosuvastatin (CRESTOR) 40 MG tablet Take 1 tablet (40 mg total) by mouth daily. 05/09/14   Minna Merritts, MD  Sennosides-Docusate Sodium (SENEXON-S PO) Take by mouth. Take two tablets by mouth once daily    [provider]  traZODone (DESYREL) 100 MG tablet Take 100 mg by mouth at bedtime.    [provider]    Allergies Other, Sulfa antibiotics, Penicillin g, and Penicillins  Family History  Problem Relation Age of Onset  . Arthritis Mother   . Heart disease Mother   . Hypertension Mother   . Diabetes Mother   . Arthritis Father   . Heart disease Father   . Hypertension Father   . Cancer Sister        breast cancer  . Diabetes Brother   . Cancer Daughter        lung cancer  . Diabetes Sister     Social History Social History   Tobacco Use  . Smoking status: Former Smoker    Quit date: 12/10/1993    Years since quitting: 25.8  . Smokeless tobacco: Former  Network engineer Use Topics  . Alcohol use: Yes    Alcohol/week: 0.0 standard drinks    Comment: Rare  . Drug use: No    Review of Systems Constitutional: No fever/chills Eyes: No visual changes. ENT: No sore throat. Cardiovascular: Denies chest pain. Positive for chest congestion. Respiratory: Positive for shortness of breath. Gastrointestinal: No abdominal pain.  No nausea, no vomiting.  No diarrhea.   Genitourinary: Negative for dysuria. Musculoskeletal: Negative for back pain. Skin: Negative for rash. Neurological: Negative for headaches, focal weakness or numbness.  ____________________________________________   PHYSICAL EXAM:  VITAL SIGNS: ED Triage Vitals  Enc Vitals Group     BP 10/04/19 0929 118/63     Pulse Rate 10/04/19 0929 (!) 112  Resp 10/04/19 0929 (!) 24     Temp 10/04/19 0930 98.3 F (36.8 C)     Temp Source 10/04/19 0929 Oral     SpO2 10/04/19 0929 92 %     Weight 10/04/19 0930 175 lb (79.4 kg)     Height 10/04/19 0930 '4\' 11"'  (1.499 m)     Head Circumference --      Peak Flow --      Pain Score 10/04/19 0929 0   Constitutional: Alert and oriented.  Eyes: Conjunctivae are normal.  ENT      Head: Normocephalic and atraumatic.      Nose: No congestion/rhinnorhea.      Mouth/Throat: Mucous membranes are moist.      Neck: No stridor. Hematological/Lymphatic/Immunilogical: No cervical lymphadenopathy. Cardiovascular: Normal rate, regular rhythm.  No murmurs, rubs, or gallops. Respiratory: Normal respiratory effort without tachypnea nor retractions. Breath sounds are clear and equal bilaterally. No wheezes/rales/rhonchi. Gastrointestinal: Soft and non tender. No rebound. No guarding.  Genitourinary: Deferred Musculoskeletal: Normal range of motion in all extremities. No lower extremity edema. Neurologic:  Normal speech and language. No gross focal neurologic deficits are appreciated.  Skin:  Skin is warm, dry and intact. No rash noted. Psychiatric:  Mood and affect are normal. Speech and behavior are normal. Patient exhibits appropriate insight and judgment.  ____________________________________________    LABS (pertinent positives/negatives)  Trop hs 13 BMP na 146, k 4.1, glu 138, cr 1.31 CBC wbc 5.3, hgb 9.4, plt 265 BNP 259.0 ____________________________________________   EKG  I, Nance Pear, attending physician, personally viewed and interpreted this EKG  EKG Time: 0941 Rate: 128 Rhythm: atrial fibrillation Axis: right axis deviation Intervals: qtc 461 QRS: Narrow ST changes: no st elevation Impression: abnormal ekg  ____________________________________________    RADIOLOGY  CXR Cardiomegaly with vascular congestions. Concern for left lower lobe opacity.  ____________________________________________   PROCEDURES  Procedures  ____________________________________________   INITIAL IMPRESSION / ASSESSMENT AND PLAN / ED COURSE  Pertinent labs & imaging results that were available during my care of the patient were reviewed by me and considered in my medical decision making (see chart for details).   Patient presented to the emergency department today because of concern for shortness of breath. Patient does have history of CHF. CXR is concerning for possible fluid overload. Did also show possible pneumonia however given lack of leukocytosis, no elevated procalcitonin and lack of fever doubt pneumonia. Will treat with lasix. Will plan on admission.  ____________________________________________   FINAL CLINICAL IMPRESSION(S) / ED DIAGNOSES  Final diagnoses:  SOB (shortness of breath)  Congestive heart failure, unspecified HF chronicity, unspecified heart failure type [facility])     Note: This dictation was prepared with Dragon dictation. Any transcriptional errors that result from this process are unintentional     Nance Pear, MD 10/04/19 1945

## 2019-10-04 NOTE — ED Notes (Signed)
Dr Sidney Ace messaged regarding BP 95/49, per MD hold cardizem an will place new orders

## 2019-10-05 ENCOUNTER — Encounter: Payer: Self-pay | Admitting: Family Medicine

## 2019-10-05 DIAGNOSIS — E785 Hyperlipidemia, unspecified: Secondary | ICD-10-CM

## 2019-10-05 DIAGNOSIS — I10 Essential (primary) hypertension: Secondary | ICD-10-CM

## 2019-10-05 DIAGNOSIS — D6489 Other specified anemias: Secondary | ICD-10-CM

## 2019-10-05 DIAGNOSIS — I5021 Acute systolic (congestive) heart failure: Secondary | ICD-10-CM

## 2019-10-05 DIAGNOSIS — I2510 Atherosclerotic heart disease of native coronary artery without angina pectoris: Secondary | ICD-10-CM

## 2019-10-05 DIAGNOSIS — E876 Hypokalemia: Secondary | ICD-10-CM

## 2019-10-05 DIAGNOSIS — I4819 Other persistent atrial fibrillation: Secondary | ICD-10-CM

## 2019-10-05 DIAGNOSIS — N183 Chronic kidney disease, stage 3 unspecified: Secondary | ICD-10-CM

## 2019-10-05 DIAGNOSIS — I5033 Acute on chronic diastolic (congestive) heart failure: Secondary | ICD-10-CM

## 2019-10-05 LAB — HEMOGLOBIN AND HEMATOCRIT, BLOOD
HCT: 30.4 % — ABNORMAL LOW (ref 36.0–46.0)
HCT: 32.6 % — ABNORMAL LOW (ref 36.0–46.0)
Hemoglobin: 9.6 g/dL — ABNORMAL LOW (ref 12.0–15.0)
Hemoglobin: 9.8 g/dL — ABNORMAL LOW (ref 12.0–15.0)

## 2019-10-05 LAB — BASIC METABOLIC PANEL
Anion gap: 12 (ref 5–15)
BUN: 21 mg/dL (ref 8–23)
CO2: 29 mmol/L (ref 22–32)
Calcium: 10.3 mg/dL (ref 8.9–10.3)
Chloride: 102 mmol/L (ref 98–111)
Creatinine, Ser: 1.22 mg/dL — ABNORMAL HIGH (ref 0.44–1.00)
GFR calc Af Amer: 46 mL/min — ABNORMAL LOW (ref >60)
GFR calc non Af Amer: 40 mL/min — ABNORMAL LOW (ref >60)
Glucose, Bld: 105 mg/dL — ABNORMAL HIGH (ref 70–99)
Potassium: 3.5 mmol/L (ref 3.5–5.1)
Sodium: 143 mmol/L (ref 135–145)

## 2019-10-05 MED ORDER — POTASSIUM CHLORIDE CRYS ER 20 MEQ PO TBCR
20.0000 meq | EXTENDED_RELEASE_TABLET | Freq: Two times a day (BID) | ORAL | Status: DC
  Administered 2019-10-05 – 2019-10-06 (×3): 20 meq via ORAL
  Filled 2019-10-05 (×3): qty 1

## 2019-10-05 MED ORDER — METOPROLOL TARTRATE 25 MG PO TABS
25.0000 mg | ORAL_TABLET | Freq: Two times a day (BID) | ORAL | Status: DC
  Administered 2019-10-05 – 2019-10-11 (×12): 25 mg via ORAL
  Filled 2019-10-05 (×12): qty 1

## 2019-10-05 NOTE — ED Notes (Signed)
Pt's skin very sensitive to tape. IV has slid out, no longer able to be used. Removed, and 20G started w/kerlix wrap

## 2019-10-05 NOTE — TOC Initial Note (Signed)
Transition of Care [facility]) - Initial/Assessment Note    Patient Details  Name: Evelyn Simmons MRN: [id number] Date of Birth: 07-21-1929  Transition of Care [facility]) CM/SW Contact:    Anselm Pancoast, RN Phone Number: 10/05/2019, 2:17 PM  Clinical Narrative:                 Spoke with patient at bedside. Patient states she lives alone other than an aide through [facility] 2 hours twice week. Uses walker for mobility. Patient states she is able to complete the majority of her ADL's and needs the aide mostly for housekeeping and grocery shopping. Patient states she is waiting to hear back from the MD regarding her pacemaker and her concerns.   Expected Discharge Plan: Home/Self Care Barriers to Discharge: Continued Medical Work up   Patient Goals and CMS Choice Patient states their goals for this hospitalization and ongoing recovery are:: Return home feeling better      Expected Discharge Plan and Services Expected Discharge Plan: Home/Self Care       Living arrangements for the past 2 months: Single Family Home                                      Prior Living Arrangements/Services Living arrangements for the past 2 months: Single Family Home Lives with:: Other (Comment) (has aide 2 hours a day x2 week) Patient language and need for interpreter reviewed:: Yes Do you feel safe going back to the place where you live?: Yes      Need for Family Participation in Patient Care: Yes (Comment) Care giver support system in place?: Yes (comment)   Criminal Activity/Legal Involvement Pertinent to Current Situation/Hospitalization: No - Comment as needed  Activities of Daily Living      Permission Sought/Granted                  Emotional Assessment Appearance:: Appears stated age Attitude/Demeanor/Rapport: Engaged, Ambitious Affect (typically observed): Accepting, Adaptable Orientation: : Oriented to Place, Oriented to Self, Oriented to  Time, Oriented to Situation Alcohol /  Substance Use: Not Applicable Psych Involvement: No (comment)  Admission diagnosis:  Acute CHF (congestive heart failure) ([place]) [I50.9] Patient Active Problem List   Diagnosis Date Noted  . Acute CHF (congestive heart failure) ([place]) 10/04/2019  . Chronic respiratory failure with hypoxia ([place])   . CHF (congestive heart failure) ([place]) 09/18/2019  . Atrial fibrillation with rapid ventricular response ([place]) 09/17/2019  . Acute diastolic CHF (congestive heart failure) ([place]) 09/17/2019  . Mobility impaired 05/29/2014  . CAD (coronary artery disease) 05/09/2014  . History of coronary artery stent placement 05/09/2014  . Hyperlipidemia 05/09/2014  . Bilateral low back pain with left-sided sciatica 04/21/2014  . Knee effusion 04/21/2014  . CKD stage 3 due to type 2 diabetes mellitus ([place]) 04/21/2014  . Pacemaker 04/21/2014  . Encounter to establish care 04/20/2014   PCP:  [facility]:   [place], [address] [place] [address] [place] [address] Phone: [phone] Fax: [phone]     Social Determinants of Health (SDOH) Interventions    Readmission Risk Interventions No flowsheet data found.

## 2019-10-05 NOTE — ED Notes (Signed)
Pharmacy contacted to send missing medication dose of cymbalta. Pharmacy states they will send medication to ED

## 2019-10-05 NOTE — Consult Note (Signed)
Cardiology Consult    Patient ID: Evelyn Simmons MRN: [id number], DOB/AGE: [birth year]   Admit date: 10/04/2019 Date of Consult: 10/05/2019  Primary Physician: [place] Primary Cardiologist: Virl Axe, MD Requesting Provider: Girard Cooter, MD  Patient Profile    Evelyn Simmons is a 86 y.o. female with a history of coronary artery disease status post prior LAD and RCA stenting (1995 and 2011 respectively), sick sinus syndrome status post [place] permanent pacemaker 2014, persistent atrial fibrillation not on anticoagulation secondary to prior history of bleeding and anemia, heart failure with midrange EF (45-50%), ICM, anemia, diabetes, hypertension, hyperlipidemia, chronic hypoxic respiratory failure on home O2, COPD, and GERD who is being seen today for the evaluation of atrial fibrillation and volume overload at the request of Dr. Girard Cooter.  Past Medical History   Past Medical History:  Diagnosis Date  . (HFmrEF) heart failure with mid-range ejection fraction ([place])    a. 09/2019 Echo: EF 45-50%, glob HK, mild conc LVH, mildly elev PASP. Sev dil LA. Mildly dil RA. Mild MR.  . Arthritis   . Asthma   . CAD (coronary artery disease)    a. 1995 s/p PCI/BMS LAD; b. 2011 s/p PCI/DES RCA.  Marland Kitchen CHF (congestive heart failure) ([place])   . COPD (chronic obstructive pulmonary disease) ([place])   . Depression   . Diabetes mellitus without complication ([place])   . Diverticulitis   . GERD (gastroesophageal reflux disease)   . Hyperlipidemia   . Hypertension   . Hypothyroid    pt unsure, not currently on medication  . Ischemic cardiomyopathy   . MI (myocardial infarction) ([place])    1995  . Persistent atrial fibrillation ([place])    a. No OAC 2/2 h/o bleeding/anemia; b. Longterm amio therapy; c. 08/2019 - recurrent afib-->rate-controlled.  . Radiculopathy     Past Surgical History:  Procedure Laterality Date  . ABDOMINAL HYSTERECTOMY    . APPENDECTOMY    . CARDIAC  CATHETERIZATION    . CORONARY ANGIOPLASTY WITH STENT PLACEMENT  12/11/1993   RCA  . FOOT SURGERY Bilateral    Over 25 years ago- Bunionectomy  . INSERT / REPLACE / REMOVE PACEMAKER  09/2012   Model # X211  serial # [id number]  . JOINT REPLACEMENT     bilateral knee replacements     Allergies  Allergies  Allergen Reactions  . Penicillins Nausea And Vomiting, Rash and Hives  . Other Other (See Comments)  . Sulfa Antibiotics     Pt unsure reaction, was told in hospital she had allergy  . Penicillin G Rash    History of Present Illness    Evelyn Simmons is a 86 y.o. female with a history of coronary artery disease status post prior LAD and RCA stenting (1995 and 2011 respectively), sick sinus syndrome status post [employer] permanent pacemaker 2014, persistent atrial fibrillation not on anticoagulation secondary to prior history of bleeding and anemia, heart failure with midrange EF (45-50%), ICM, anemia, diabetes, hypertension, hyperlipidemia, chronic hypoxic respiratory failure on home O2, COPD, and GERD.  In July of this year, she developed recurrent atrial fibrillation, which was noted by her primary care providers.  In early August, she began to experience palpitations and dyspnea and was admitted with A. fib, RVR.  She also was noted to be volume overloaded.  She was diuresed.  She was initially placed on intravenous amiodarone however, given lack of outpatient anticoagulation and unknown duration, this was discontinued and her home dose of amiodarone  100 mg daily was resumed for rate control.  Echocardiogram during admission showed an EF of 45 to 50%.  With prior history of GI bleed and ongoing anemia, decision made to discharge her on aspirin only.  Unfortunately, she reports that she has continued to have dyspnea on exertion ever since that hospitalization along with 3 pillow orthopnea and PND.  She has not been having any significant edema.  She actually presented to the emergency  department on the evening of August 21 where EKG showed A. fib.  She did not wait to be formally evaluated.  She continued to struggle over the weekend and presented back to the emergency department on August 23 where she was found to be in A. fib at a rate of 112.  She was hypertensive at 180/63.  Covid testing was negative.  High-sensitivity troponin was mildly elevated at 24, and chest x-ray showed cardiomegaly and vascular congestion.  She reported dark stool however this was Hemoccult negative.  She has been placed on intravenous Lasix and we have been asked to evaluate.   Inpatient Medications    . amiodarone  100 mg Oral Daily  . cholecalciferol  1,000 Units Oral Daily  . diltiazem  5 mg Intravenous Once  . DULoxetine  30 mg Oral Daily  . furosemide  40 mg Intravenous Q12H  . metoprolol succinate  12.5 mg Oral Daily  . pantoprazole (PROTONIX) IV  40 mg Intravenous Q12H  . potassium chloride SA  20 mEq Oral Daily  . rosuvastatin  40 mg Oral Daily  . sodium chloride flush  3 mL Intravenous Q12H  . traZODone  100 mg Oral QHS    Family History    Family History  Problem Relation Age of Onset  . Arthritis Mother   . Heart disease Mother   . Hypertension Mother   . Diabetes Mother   . Arthritis Father   . Heart disease Father   . Hypertension Father   . Cancer Sister        breast cancer  . Diabetes Brother   . Cancer Daughter        lung cancer  . Diabetes Sister    She indicated that her mother is deceased. She indicated that her father is deceased. She indicated that the status of her brother is unknown. She indicated that the status of her daughter is unknown.   Social History    Social History   Socioeconomic History  . Marital status: Widowed    Spouse name: Not on file  . Number of children: Not on file  . Years of education: Not on file  . Highest education level: Not on file  Occupational History  . Not on file  Tobacco Use  . Smoking status: Former  Smoker    Quit date: 12/10/1993    Years since quitting: 25.8  . Smokeless tobacco: Former Network engineer and Sexual Activity  . Alcohol use: Yes    Alcohol/week: 0.0 standard drinks    Comment: Rare  . Drug use: No  . Sexual activity: Not Currently  Other Topics Concern  . Not on file  Social History Narrative   Moved from [place] with eldest daughter   7 children, 11 grandchildren, 47 GG children    Social Determinants of Health   Financial Resource Strain:   . Difficulty of Paying Living Expenses: Not on file  Food Insecurity:   . Worried About Charity fundraiser in the Last  Year: Not on file  . Ran Out of Food in the Last Year: Not on file  Transportation Needs:   . Lack of Transportation (Medical): Not on file  . Lack of Transportation (Non-Medical): Not on file  Physical Activity:   . Days of Exercise per Week: Not on file  . Minutes of Exercise per Session: Not on file  Stress:   . Feeling of Stress : Not on file  Social Connections:   . Frequency of Communication with Friends and Family: Not on file  . Frequency of Social Gatherings with Friends and Family: Not on file  . Attends Religious Services: Not on file  . Active Member of Clubs or Organizations: Not on file  . Attends Archivist Meetings: Not on file  . Marital Status: Not on file  Intimate Partner Violence:   . Fear of Current or Ex-Partner: Not on file  . Emotionally Abused: Not on file  . Physically Abused: Not on file  . Sexually Abused: Not on file     Review of Systems    General:  No chills, fever, night sweats or weight changes.  Cardiovascular:  No chest pain, +++ dyspnea on exertion, +++ mild edema, +++ orthopnea, +++ palpitations, +++ paroxysmal nocturnal dyspnea. Dermatological: No rash, lesions/masses Respiratory: No cough, +++ dyspnea Urologic: No hematuria, dysuria Abdominal:   No nausea, vomiting, diarrhea, bright red blood per rectum, melena, or  hematemesis Neurologic:  No visual changes, wkns, changes in mental status. All other systems reviewed and are otherwise negative except as noted above.  Physical Exam    Blood pressure 131/65, pulse (!) 137, temperature 98.4 F (36.9 C), temperature source Oral, resp. rate (!) 21, height 4\' 11"  (1.499 m), weight 79.4 kg, SpO2 93 %.  General: Pleasant, NAD Psych: Normal affect. Neuro: Alert and oriented X 3. Moves all extremities spontaneously. HEENT: Normal  Neck: Supple without bruits.  Difficult to gauge JVP. Lungs:  Resp regular and unlabored, diminished at bases. Heart: Irregularly, irregular, no s3, s4, or murmurs. Abdomen: Soft, non-tender, non-distended, BS + x 4.  Extremities: No clubbing, cyanosis.  Trace bilateral lower extremity edema. DP/PT/Radials 2+ and equal bilaterally.  Labs    Cardiac Enzymes Recent Labs  Lab 09/17/19 1055 09/17/19 1539 10/02/19 1032 10/04/19 0935 10/04/19 1600  TROPONINIHS 19* 21* 13 13 24*      Lab Results  Component Value Date   WBC 5.3 10/04/2019   HGB 9.6 (L) 10/05/2019   HCT 30.4 (L) 10/05/2019   MCV 96.6 10/04/2019   PLT 265 10/04/2019    Recent Labs  Lab 10/02/19 1032 10/04/19 0935 10/05/19 0605  NA 140   < > 143  K 4.3   < > 3.5  CL 102   < > 102  CO2 28   < > 29  BUN 25*   < > 21  CREATININE 1.59*   < > 1.22*  CALCIUM 10.1   < > 10.3  PROT 6.4*  --   --   BILITOT 0.8  --   --   ALKPHOS 65  --   --   ALT 41  --   --   AST 47*  --   --   GLUCOSE 123*   < > 105*   < > = values in this interval not displayed.   Lab Results  Component Value Date   CHOL 122 09/18/2019   HDL 34 (L) 09/18/2019   LDLCALC 70 09/18/2019   TRIG 89  09/18/2019    Radiology Studies    DG Chest 2 View  Result Date: 10/04/2019 CLINICAL DATA:  Shortness of breath with chest congestion EXAM: CHEST - 2 VIEW COMPARISON:  October 02, 2019 FINDINGS: There is ill-defined airspace opacity in the left lower lung region. There is a minimal  pleural effusion on each side. No appreciable interstitial edema. There is cardiomegaly with mild pulmonary venous hypertension. Pacemaker leads are attached to the right atrium and right ventricle. There is aortic atherosclerosis. No adenopathy. No bone lesions. IMPRESSION: Persistent cardiomegaly with pulmonary vascular congestion. Trace pleural effusions bilaterally. There may be a degree of underlying congestive heart failure. There is airspace opacity in the left lower lung region concerning for superimposed developing pneumonia. Pacemaker leads attached to right atrium and right ventricle. Aortic Atherosclerosis (ICD10-I70.0). Electronically Signed   By: Lowella Grip III M.D.   On: 10/04/2019 10:08   DG Chest 2 View  Result Date: 10/02/2019 CLINICAL DATA:  86 year old female with history of shortness of breath. EXAM: CHEST - 2 VIEW COMPARISON:  Chest x-ray 09/17/2019. FINDINGS: Lung volumes are decreased. There is cephalization of the pulmonary vasculature and slight indistinctness of the interstitial markings suggestive of mild pulmonary edema. Trace left pleural effusion. No right pleural effusion. Heart size is mildly enlarged. Upper mediastinal contours are within normal limits. Aortic atherosclerosis. Left-sided pacemaker device in place with lead tips projecting over the expected location of the right atrium and right ventricle. IMPRESSION: 1. The appearance of the chest suggests mild congestive heart failure, as above. 2. Aortic atherosclerosis. Electronically Signed   By: Vinnie Langton M.D.   On: 10/02/2019 12:00    ECG & Cardiac Imaging    Atrial fibrillation, 120, ventricular pacing on demand, nonspecific ST/T changes- personally reviewed.  Assessment & Plan    1.  Acute on chronic heart failure with midrange ejection fraction/ischemic cardiomyopathy: Patient second admission this month in the setting of volume overload and rapid atrial fibrillation.  Upon last admission, echo  showed an EF of 45-50%.  Unfortunately, she has continued to have dyspnea on exertion, orthopnea, PND, mild lower extremity swelling since then.  She was actually the emergency department on August 21 where EKG continued to show atrial fibrillation and chest x-ray showed vascular congestion.  She left without being seen and continued to struggle over the weekend, prompting presentation August 23.  Chest x-ray has again shown mild CHF.  She has been treated with intravenous Lasix and notes good response though I's and O's are incomplete.  Agree with admission for diuresis.  Will be important to monitor daily weights and strict I's and O's.  Strict rate control will be important.  Continue beta-blocker.  2.  Persistent atrial fibrillation: Rapid rates in the setting of above.  She has been on amiodarone 100 mg daily and metoprolol XL 12.5 mg daily at home.  Review of last admission shows that pressures were frequently in the 90s, with likely limited titration of beta-blocker.  Pressures currently in the 130s I will increase Toprol-XL to 25 mg daily.  With mild LV dysfunction, would prefer to avoid diltiazem and with advanced age and CKD 3, would prefer to avoid digoxin.  As amiodarone is not helping to maintain sinus rhythm, it is of little utility at this point we will discontinue.  Suspect rates will improve some with diuresis.  According to records, we were first notified of recurrent atrial fibrillation back in July.  Left atrium was severely dilated on echo earlier this  month.  She is not anticoagulated secondary to history of GI bleeding and anemia and thus rate control is her only real option here.  3.  Essential hypertension: Stable.  Follow titration of beta-blocker and also diuresis.  4.  Coronary artery disease: No chest pain.  High-sensitivity troponin minimally elevated at 24.  Likely mild, demand ischemia in the setting of rapid rates and heart failure.  Continue aspirin, beta-blocker, and statin  therapy.  5.  Stage III chronic kidney disease: Stable on this presentation.  Follow with diuresis.  6.  Hyperlipidemia: On statin therapy with an LDL of 70 and early August.  7.  Normocytic anemia: H&H stable.  Fecal occult blood was negative.  8.  Mild hypokalemia: Potassium low end of normal at 3.5.  With diuresis, will require additional supplementation.  Signed, Murray Hodgkins, NP 10/05/2019, 4:20 PM  For questions or updates, please contact   Please consult [email or web address] for contact info under Cardiology/STEMI.

## 2019-10-05 NOTE — Progress Notes (Signed)
Pt assessed HR elevated no complaints verbalized. Will continue to monitor.

## 2019-10-05 NOTE — Progress Notes (Signed)
PROGRESS NOTE    Evelyn Simmons  INO:[id number] DOB: 1929/12/29 DOA: 10/04/2019 PCP: [facility]  Brief Narrative:  Evelyn Simmons  is a 86 y.o. Caucasian female with a known history of COPD and chronic respiratory failure on home O2 at 3 L/min, type diabetes mellitus, systolic and diastolic CHF, atrial fibrillation, status post pacemaker, dyslipidemia, hypertension and hypothyroidism, who presented to the emergency room with acute onset of worsening dyspnea over the last month and significantly worse over the last few days.  She came to the emergency room a couple of days ago and left before being seen.  She has been having chest congestion but denies any cough.  She was concerned about having fluid overload.  No fever chills.  No nausea vomiting or abdominal pain or diarrhea.  She admits to 3 pillow orthopnea and paroxysmal nocturnal dyspnea.  She denies any significant lower extremity edema.  She admits to dry cough without wheezing or hemoptysis.  No chest pain however she has been having palpitations.  No dysuria, oliguria or hematuria or flank pain.  She has been having dark stools and has noted bright red bleeding per rectum and mentions a history of diverticulosis and diverticulitis.  She had negative guaiac no.  Upon presentation to the ER, heart rate was 112 with respiratory rate of 24 and pulse oximetry 92% on room air with a blood pressure 180/63 and temperature 98.3.  Labs revealed a BUN of 25 and creatinine 1.35 compared to 25 and 1.592 days ago, BNP of 259 up from 233 then, high-sensitivity troponin I of 13 and later 24 and procalcitonin less than 0.1.  CBC showed anemia with hemoglobin of 9.4 hematocrit 31.1 close to her level 2 days ago however compared to 11 and 34.2 on 09/17/2019.  COVID-19 PCR is currently pending.  Showed her pacemaker attached to the right atrium and right ventricle, persistent cardiomegaly with pulmonary vascular congestion and trace bilateral pleural  effusions with suspected underlying CHF as well as an airspace opacity in the left lower lobe concerning for superimposed developing pneumonia.  It showed aortic atherosclerosis. EKG showed atrial fibrillation with a ventricular sponsor 126 and later 120 with nonspecific intraventricular conduction delay with left axis deviation, LVH and taste beats and triplets on her second EKG.  The patient's most recent 2D echo revealed an EF of 45 to 50% with mildly diminished systolic function, mild concentric LVH and inability to detect diastolic function, severe left atrial dilatation and mild right atrial dilatation.  The patient had dark stool but stool Hemoccult came back negative as above.  The patient was given 60 mg of IV Lasix.  She will be admitted to a progressive unit bed for further evaluation and management.   Assessment & Plan:    Acute on chronic systolic and likely diastolic CHF. -The patient will be admitted to a progressive unit bed. -We will continue diuresis with IV Lasix. -We will follow serial troponin I's. -We will monitor I's and O's. -She had a very recent echo and therefore will not repeat it. -We will obtain a cardiology consultation in a.m. with [facility]. -I notified Dr. Dorris Carnes about the patient.  --Patient's BNP on admission is 233, 259 yesterday on the 23rd.  Paroxysmal atrial fibrillation with intermittent rapid ventricular response.   The patient's CHA2DS2-VASc score is 6. -The patient will be continued on amiodarone and will utilize IV drip as needed. -We will utilize IV digoxin as needed given borderline blood pressure. -Pt on Toprol  xl 25 mg.  GI bleeding, likely of lower GI etiology, possibly due to diverticulosis and likely contributing to worsening/acute on chronic blood loss anemia. One cannot rule out upper GI etiology as well. -We will follow serial hemoglobins and hematocrits. -We will hold aspirin and avoid anticoagulants at this time. -We will  place her on IV Protonix 40 mg every 12 hours. -We will obtain a GI consultation. -I notified Dr. Marius Ditch about the patient.  CKD Stage III: --Patient's creatinine was normal in 2016 it was 1.13, the next blood work we have is from August 2021 on 6 August her creatinine was 1.58, 1.34, 1.39 on 8 August. Ring this admission patient's creatinine started at 1.59 on 21 August, 1.31 on 23 August, 1.22 on 24 August.  Patient's kidney function is improving.  We will of course avoid any nephrotoxic agents contrast and studies and renally dose all needed medications.  Essential hypertension. -We will continue Norvasc. - Continued Toprol at 25 mg daily.   Depression and anxiety. -We will continue Cymbalta and Seroquel as well as Xanax.  Type II diabetes mellitus with peripheral neuropathy. -We will place her on supplemental coverage with NovoLog and continue Neurontin. -Basal coverage will be continued. --Patient's hemoglobin A1c is 6.9 checked on 6 August of this year.  Dyslipidemia. -Statin therapy will be resumed.  DVT prophylaxis. -We will place her on Eliquis.   DVT prophylaxis: Patient currently on Eliquis.   Code Status: Full code  family Communication: Daughter however not at bedside.   Disposition Plan: To be determined.  Consultants:   Cardiology consulted: Dr. Adam Phenix.  Subjective: Patient seen today morning she is alert awake and oriented heart rate has been intermittently over 100.  Cardiology has been consulted.  Patient denies any chest pain pressure or shortness of breath right now states her breathing is a little bit better.  Currently patient lives with her daughter.  Objective: Vitals:   10/05/19 1300 10/05/19 1315 10/05/19 1530 10/05/19 1700  BP: 131/65   99/76  Pulse: 93 (!) 137  100  Resp: (!) 26 (!) 21 (!) 24   Temp:      TempSrc:      SpO2: 100% 93%  96%  Weight:      Height:        Intake/Output Summary (Last 24 hours) at 10/05/2019  1725 Last data filed at 10/05/2019 1144 Gross per 24 hour  Intake --  Output 1450 ml  Net -1450 ml   Filed Weights   10/04/19 0930  Weight: 79.4 kg    Examination: Blood pressure 99/76, pulse 100, temperature 98.4 F (36.9 C), temperature source Oral, resp. rate (!) 24, height 4\' 11"  (1.499 m), weight 79.4 kg, SpO2 96 %. General exam: Appears calm and comfortable  Respiratory system: Clear to auscultation. Respiratory effort normal. Cardiovascular system: S1 & S2 heard, RRR. No JVD, murmurs, rubs, gallops or clicks. No pedal edema. Gastrointestinal system: Abdomen is nondistended, soft and nontender. No organomegaly or masses felt. Normal bowel sounds heard. Central nervous system: Alert and oriented. No focal neurological deficits. Extremities: Symmetric 5 x 5 power. Skin: No rashes, lesions or ulcers Psychiatry: Judgement and insight appear normal. Mood & affect appropriate.   Data Reviewed: I have personally reviewed following labs and imaging studies  CBC: Recent Labs  Lab 10/02/19 1032 10/04/19 0935 10/05/19 0605  WBC 5.7 5.3  --   HGB 9.7* 9.4* 9.6*  HCT 31.7* 31.1* 30.4*  MCV 96.4 96.6  --  PLT 285 265  --    Basic Metabolic Panel: Recent Labs  Lab 10/02/19 1032 10/04/19 0935 10/05/19 0605  NA 140 146* 143  K 4.3 4.1 3.5  CL 102 107 102  CO2 28 29 29   GLUCOSE 123* 138* 105*  BUN 25* 25* 21  CREATININE 1.59* 1.31* 1.22*  CALCIUM 10.1 10.4* 10.3  MG  --  2.0  --    GFR: Estimated Creatinine Clearance: 29 mL/min (A) (by C-G formula based on SCr of 1.22 mg/dL (H)). Liver Function Tests: Recent Labs  Lab 10/02/19 1032  AST 47*  ALT 41  ALKPHOS 65  BILITOT 0.8  PROT 6.4*  ALBUMIN 3.5   No results for input(s): LIPASE, AMYLASE in the last 168 hours. No results for input(s): AMMONIA in the last 168 hours. Coagulation Profile: No results for input(s): INR, PROTIME in the last 168 hours. Cardiac Enzymes: No results for input(s): CKTOTAL, CKMB,  CKMBINDEX, TROPONINI in the last 168 hours. BNP (last 3 results) No results for input(s): PROBNP in the last 8760 hours. HbA1C: No results for input(s): HGBA1C in the last 72 hours. CBG: No results for input(s): GLUCAP in the last 168 hours. Lipid Profile: No results for input(s): CHOL, HDL, LDLCALC, TRIG, CHOLHDL, LDLDIRECT in the last 72 hours. Thyroid Function Tests: Recent Labs    10/04/19 0935  TSH 3.163   Anemia Panel: No results for input(s): VITAMINB12, FOLATE, FERRITIN, TIBC, IRON, RETICCTPCT in the last 72 hours. Sepsis Labs: Recent Labs  Lab 10/04/19 0935  PROCALCITON <0.10    Recent Results (from the past 240 hour(s))  SARS Coronavirus 2 by RT PCR (hospital order, performed in [facility] hospital lab) Nasopharyngeal Nasopharyngeal Swab     Status: None   Collection Time: 10/04/19  6:56 PM   Specimen: Nasopharyngeal Swab  Result Value Ref Range Status   SARS Coronavirus 2 NEGATIVE NEGATIVE Final    Comment: (NOTE) SARS-CoV-2 target nucleic acids are NOT DETECTED.  The SARS-CoV-2 RNA is generally detectable in upper and lower respiratory specimens during the acute phase of infection. The lowest concentration of SARS-CoV-2 viral copies this assay can detect is 250 copies / mL. A negative result does not preclude SARS-CoV-2 infection and should not be used as the sole basis for treatment or other patient management decisions.  A negative result may occur with improper specimen collection / handling, submission of specimen other than nasopharyngeal swab, presence of viral mutation(s) within the areas targeted by this assay, and inadequate number of viral copies (<250 copies / mL). A negative result must be combined with clinical observations, patient history, and epidemiological information.  Fact Sheet for Patients:   StrictlyIdeas.no  Fact Sheet for Healthcare Providers: BankingDealers.co.za  This test is not  yet approved or  cleared by the [place] FDA and has been authorized for detection and/or diagnosis of SARS-CoV-2 by FDA under an Emergency Use Authorization (EUA).  This EUA will remain in effect (meaning this test can be used) for the duration of the COVID-19 declaration under Section 564(b)(1) of the Act, 21 U.S.C. section 360bbb-3(b)(1), unless the authorization is terminated or revoked sooner.  Performed at [facility], [address] [place], [place] [address]        Radiology Studies: DG Chest 2 View  Result Date: 10/04/2019 CLINICAL DATA:  Shortness of breath with chest congestion EXAM: CHEST - 2 VIEW COMPARISON:  October 02, 2019 FINDINGS: There is ill-defined airspace opacity in the left lower lung region. There is  a minimal pleural effusion on each side. No appreciable interstitial edema. There is cardiomegaly with mild pulmonary venous hypertension. Pacemaker leads are attached to the right atrium and right ventricle. There is aortic atherosclerosis. No adenopathy. No bone lesions. IMPRESSION: Persistent cardiomegaly with pulmonary vascular congestion. Trace pleural effusions bilaterally. There may be a degree of underlying congestive heart failure. There is airspace opacity in the left lower lung region concerning for superimposed developing pneumonia. Pacemaker leads attached to right atrium and right ventricle. Aortic Atherosclerosis (ICD10-I70.0). Electronically Signed   By: Lowella Grip III M.D.   On: 10/04/2019 10:08    Scheduled Meds: . cholecalciferol  1,000 Units Oral Daily  . DULoxetine  30 mg Oral Daily  . furosemide  40 mg Intravenous Q12H  . metoprolol tartrate  25 mg Oral BID  . pantoprazole (PROTONIX) IV  40 mg Intravenous Q12H  . potassium chloride SA  20 mEq Oral BID  . rosuvastatin  40 mg Oral Daily  . sodium chloride flush  3 mL Intravenous Q12H  . traZODone  100 mg Oral QHS   Continuous Infusions: . sodium chloride       LOS: 1  day   Para Skeans, MD [facility] Pager [phone]. If 7PM-7AM, please contact night-coverage [email or web address] Password [email or web address] 10/05/2019, 5:25 PM

## 2019-10-05 NOTE — ED Notes (Signed)
Pt was given a warm blanket

## 2019-10-06 DIAGNOSIS — I509 Heart failure, unspecified: Secondary | ICD-10-CM

## 2019-10-06 DIAGNOSIS — I4819 Other persistent atrial fibrillation: Secondary | ICD-10-CM

## 2019-10-06 LAB — CBC WITH DIFFERENTIAL/PLATELET
Abs Immature Granulocytes: 0.01 10*3/uL (ref 0.00–0.07)
Basophils Absolute: 0 10*3/uL (ref 0.0–0.1)
Basophils Relative: 0 %
Eosinophils Absolute: 0.1 10*3/uL (ref 0.0–0.5)
Eosinophils Relative: 2 %
HCT: 29.2 % — ABNORMAL LOW (ref 36.0–46.0)
Hemoglobin: 8.9 g/dL — ABNORMAL LOW (ref 12.0–15.0)
Immature Granulocytes: 0 %
Lymphocytes Relative: 19 %
Lymphs Abs: 1 10*3/uL (ref 0.7–4.0)
MCH: 29.8 pg (ref 26.0–34.0)
MCHC: 30.5 g/dL (ref 30.0–36.0)
MCV: 97.7 fL (ref 80.0–100.0)
Monocytes Absolute: 0.8 10*3/uL (ref 0.1–1.0)
Monocytes Relative: 14 %
Neutro Abs: 3.6 10*3/uL (ref 1.7–7.7)
Neutrophils Relative %: 65 %
Platelets: 244 10*3/uL (ref 150–400)
RBC: 2.99 MIL/uL — ABNORMAL LOW (ref 3.87–5.11)
RDW: 15.9 % — ABNORMAL HIGH (ref 11.5–15.5)
WBC: 5.5 10*3/uL (ref 4.0–10.5)
nRBC: 0 % (ref 0.0–0.2)

## 2019-10-06 LAB — COMPREHENSIVE METABOLIC PANEL
ALT: 20 U/L (ref 0–44)
AST: 16 U/L (ref 15–41)
Albumin: 2.9 g/dL — ABNORMAL LOW (ref 3.5–5.0)
Alkaline Phosphatase: 51 U/L (ref 38–126)
Anion gap: 7 (ref 5–15)
BUN: 25 mg/dL — ABNORMAL HIGH (ref 8–23)
CO2: 33 mmol/L — ABNORMAL HIGH (ref 22–32)
Calcium: 9.7 mg/dL (ref 8.9–10.3)
Chloride: 103 mmol/L (ref 98–111)
Creatinine, Ser: 1.41 mg/dL — ABNORMAL HIGH (ref 0.44–1.00)
GFR calc Af Amer: 38 mL/min — ABNORMAL LOW (ref >60)
GFR calc non Af Amer: 33 mL/min — ABNORMAL LOW (ref >60)
Glucose, Bld: 113 mg/dL — ABNORMAL HIGH (ref 70–99)
Potassium: 4.3 mmol/L (ref 3.5–5.1)
Sodium: 143 mmol/L (ref 135–145)
Total Bilirubin: 0.9 mg/dL (ref 0.3–1.2)
Total Protein: 5.5 g/dL — ABNORMAL LOW (ref 6.5–8.1)

## 2019-10-06 LAB — MAGNESIUM: Magnesium: 1.8 mg/dL (ref 1.7–2.4)

## 2019-10-06 LAB — PHOSPHORUS: Phosphorus: 3 mg/dL (ref 2.5–4.6)

## 2019-10-06 LAB — GLUCOSE, CAPILLARY: Glucose-Capillary: 125 mg/dL — ABNORMAL HIGH (ref 70–99)

## 2019-10-06 NOTE — Progress Notes (Addendum)
PROGRESS NOTE    Evelyn Simmons   BJS:[id number]  DOB: January 22, 1930  PCP: [facility]    DOA: 10/04/2019 LOS: 2   Brief Narrative   Evelyn Simmons  is a 86 y.o. Caucasian female with a known history of COPD and chronic respiratory failure on home O2 at 3 L/min, type diabetes mellitus, systolic and diastolic CHF, atrial fibrillation, status post pacemaker, dyslipidemia, hypertension and hypothyroidism, who presented to the emergency room with acute onset of worsening dyspnea over the last month and significantly worse over the last few days.  She came to the emergency room a couple of days ago and left before being seen.  She has been having chest congestion but denies any cough.  She was concerned about having fluid overload.  No fever chills.  No nausea vomiting or abdominal pain or diarrhea.  She admits to 3 pillow orthopnea and paroxysmal nocturnal dyspnea.  She denies any significant lower extremity edema.  She admits to dry cough without wheezing or hemoptysis.  No chest pain however she has been having palpitations.  No dysuria, oliguria or hematuria or flank pain.  She has been having dark stools and has noted bright red bleeding per rectum and mentions a history of diverticulosis and diverticulitis.  She had negative guaiac no.   Upon presentation to the ER, heart rate was 112 with respiratory rate of 24 and pulse oximetry 92% on room air with a blood pressure 180/63 and temperature 98.3.  Labs revealed a BUN of 25 and creatinine 1.35 compared to 25 and 1.592 days ago, BNP of 259 up from 233 then, high-sensitivity troponin I of 13 and later 24 and procalcitonin less than 0.1.  CBC showed anemia with hemoglobin of 9.4 hematocrit 31.1 close to her level 2 days ago however compared to 11 and 34.2 on 09/17/2019.  COVID-19 PCR is currently pending.  Showed her pacemaker attached to the right atrium and right ventricle, persistent cardiomegaly with pulmonary vascular congestion and trace  bilateral pleural effusions with suspected underlying CHF as well as an airspace opacity in the left lower lobe concerning for superimposed developing pneumonia.  It showed aortic atherosclerosis. EKG showed atrial fibrillation with a ventricular sponsor 126 and later 120 with nonspecific intraventricular conduction delay with left axis deviation, LVH and taste beats and triplets on her second EKG.   The patient's most recent 2D echo revealed an EF of 45 to 50% with mildly diminished systolic function, mild concentric LVH and inability to detect diastolic function, severe left atrial dilatation and mild right atrial dilatation.   The patient had dark stool but stool Hemoccult came back negative as above.   The patient was given 60 mg of IV Lasix.  She will be admitted to a progressive unit bed for further evaluation and management.     Assessment & Plan   Active Problems:   Acute CHF (congestive heart failure) (HCC)   Persistent atrial fibrillation (HCC)   Acute on chronic systolic and likely diastolic CHF - POA.  Presented with progressive dyspnea, orthopnea, PND, imaging showed pulmonary vascular congestion, all consistent with decompensated CHF.  Recent Echo on 8/7 showed EF 45-50%, indeterminate diastolic parameters, mild MR.   --Cardiology following -Continue IV Lasix --strict I/O's and daily weights --continue metoprolol --no ACEI/ARB currently due to low BP's  Persistent A-fib with intermittent RVR - CHA2DS2-VASc score is 6.  Continue amiodarone and will utilize IV drip as needed.  Monitor on telemetry.  Continue Toprol 25 mg PO  daily.  Previously not on anticoagulation due to anemia.  GI bleeding - reported melena and some BRBPR on admission, with known history diverticulosis, this is likely lower GI source.  Hbg is being trended and stable around 9 (down from 11 earlier this month). --continue trend H/H --transfuse if Hbg < 7.0 --GI was consulted on admission -Hold aspirin and  all anticoagulants for now --Continue IV Protonix BID   CKD Stage IIIb - baseline Cr appears to be 1.2-1.3 recently.  Presented with Cr 1.59>>1.31>>1.22>>1.41 today.  Monitor closely with diuresis.  Avoid nephrotoxins and hypotension.  Renally dose meds as indicated.   Sick sinus syndrome - s/p pacemaker.  Monitor.  Essential hypertension - chronic, stable. Continue Norvasc and Toprol.    Depression and anxiety - continue Cymbalta and Seroquel as well as Xanax.   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  Continue sliding scale NovoLog and Neurontin.  Continue basal insulin.     Dyslipidemia - continue statin     Obesity: Body mass index is 34.82 kg/m.  Complicates overall care and prognosis.   DVT prophylaxis: Place and maintain sequential compression device Start: 10/04/19 2327   Diet:  Diet Orders (From admission, onward)     Start     Ordered   10/04/19 1858  Diet Heart Room service appropriate? Yes; Fluid consistency: Thin  Diet effective now       Question Answer Comment  Room service appropriate? Yes   Fluid consistency: Thin      10/04/19 1906              Code Status: DNR    Subjective 10/06/19    Patient seen at bedside today.  Reports feeling better, less short of breath today but has not been up moving around.  Says she lives alone with help of her daughter as needed.  Denies chest pain, F/C, N/V/D or other complaints.   Disposition Plan & Communication   Status is: Inpatient  Remains inpatient appropriate because:IV treatments appropriate due to intensity of illness or inability to take PO   Dispo: The patient is from: Home              Anticipated d/c is to: Home              Anticipated d/c date is: 2 days              Patient currently is not medically stable to d/c.   Family Communication: none at bedside, will attempt to call    Consults, Procedures, Significant Events   Consultants:    Cardiology Gastroenterology  Procedures:  None    Objective   Vitals:   10/06/19 0544 10/06/19 0545 10/06/19 1300 10/06/19 1700  BP: (!) 96/52  (!) 102/58 (!) 104/59  Pulse: 75  97 83  Resp:      Temp: 98.4 F (36.9 C)  98.9 F (37.2 C) 98.3 F (36.8 C)  TempSrc: Oral  Oral Oral  SpO2: 93%  100% 100%  Weight:  78.2 kg    Height:        Intake/Output Summary (Last 24 hours) at 10/06/2019 1830 Last data filed at 10/06/2019 1950 Gross per 24 hour  Intake 120 ml  Output 500 ml  Net -380 ml   Filed Weights   10/04/19 0930 10/05/19 1756 10/06/19 0545  Weight: 79.4 kg 78.2 kg 78.2 kg    Physical Exam:  General exam: awake, alert, no acute  distress, obese Respiratory system: CTAB, no wheezes, rales or rhonchi, normal respiratory effort. Cardiovascular system: normal S1/S2, RRR, no pedal edema.   Gastrointestinal system: soft, NT, ND, no HSM felt, +bowel sounds. Central nervous system: A&O x3. no gross focal neurologic deficits, normal speech Extremities: moves all, no cyanosis, normal tone Psychiatry: normal mood, congruent affect, judgement and insight appear normal  Labs   Data Reviewed: I have personally reviewed following labs and imaging studies  CBC: Recent Labs  Lab 10/02/19 1032 10/04/19 0935 10/05/19 0605 10/05/19 1832 10/06/19 0434  WBC 5.7 5.3  --   --  5.5  NEUTROABS  --   --   --   --  3.6  HGB 9.7* 9.4* 9.6* 9.8* 8.9*  HCT 31.7* 31.1* 30.4* 32.6* 29.2*  MCV 96.4 96.6  --   --  97.7  PLT 285 265  --   --  622   Basic Metabolic Panel: Recent Labs  Lab 10/02/19 1032 10/04/19 0935 10/05/19 0605 10/06/19 0434  NA 140 146* 143 143  K 4.3 4.1 3.5 4.3  CL 102 107 102 103  CO2 28 29 29  33*  GLUCOSE 123* 138* 105* 113*  BUN 25* 25* 21 25*  CREATININE 1.59* 1.31* 1.22* 1.41*  CALCIUM 10.1 10.4* 10.3 9.7  MG  --  2.0  --  1.8  PHOS  --   --   --  3.0   GFR: Estimated Creatinine Clearance: 24.9 mL/min (A) (by C-G formula based on SCr of  1.41 mg/dL (H)). Liver Function Tests: Recent Labs  Lab 10/02/19 1032 10/06/19 0434  AST 47* 16  ALT 41 20  ALKPHOS 65 51  BILITOT 0.8 0.9  PROT 6.4* 5.5*  ALBUMIN 3.5 2.9*   No results for input(s): LIPASE, AMYLASE in the last 168 hours. No results for input(s): AMMONIA in the last 168 hours. Coagulation Profile: No results for input(s): INR, PROTIME in the last 168 hours. Cardiac Enzymes: No results for input(s): CKTOTAL, CKMB, CKMBINDEX, TROPONINI in the last 168 hours. BNP (last 3 results) No results for input(s): PROBNP in the last 8760 hours. HbA1C: No results for input(s): HGBA1C in the last 72 hours. CBG: Recent Labs  Lab 10/06/19 0612  GLUCAP 125*   Lipid Profile: No results for input(s): CHOL, HDL, LDLCALC, TRIG, CHOLHDL, LDLDIRECT in the last 72 hours. Thyroid Function Tests: Recent Labs    10/04/19 0935  TSH 3.163   Anemia Panel: No results for input(s): VITAMINB12, FOLATE, FERRITIN, TIBC, IRON, RETICCTPCT in the last 72 hours. Sepsis Labs: Recent Labs  Lab 10/04/19 0935  PROCALCITON <0.10    Recent Results (from the past 240 hour(s))  SARS Coronavirus 2 by RT PCR (hospital order, performed in [facility] hospital lab) Nasopharyngeal Nasopharyngeal Swab     Status: None   Collection Time: 10/04/19  6:56 PM   Specimen: Nasopharyngeal Swab  Result Value Ref Range Status   SARS Coronavirus 2 NEGATIVE NEGATIVE Final    Comment: (NOTE) SARS-CoV-2 target nucleic acids are NOT DETECTED.  The SARS-CoV-2 RNA is generally detectable in upper and lower respiratory specimens during the acute phase of infection. The lowest concentration of SARS-CoV-2 viral copies this assay can detect is 250 copies / mL. A negative result does not preclude SARS-CoV-2 infection and should not be used as the sole basis for treatment or other patient management decisions.  A negative result may occur with improper specimen collection / handling, submission of specimen  other than nasopharyngeal swab, presence of viral  mutation(s) within the areas targeted by this assay, and inadequate number of viral copies (<250 copies / mL). A negative result must be combined with clinical observations, patient history, and epidemiological information.  Fact Sheet for Patients:   StrictlyIdeas.no  Fact Sheet for Healthcare Providers: BankingDealers.co.za  This test is not yet approved or  cleared by the [place] FDA and has been authorized for detection and/or diagnosis of SARS-CoV-2 by FDA under an Emergency Use Authorization (EUA).  This EUA will remain in effect (meaning this test can be used) for the duration of the COVID-19 declaration under Section 564(b)(1) of the Act, 21 U.S.C. section 360bbb-3(b)(1), unless the authorization is terminated or revoked sooner.  Performed at [facility], [address], [place], [place] [address]       Imaging Studies   No results found.   Medications   Scheduled Meds:  cholecalciferol  1,000 Units Oral Daily   DULoxetine  30 mg Oral Daily   furosemide  40 mg Intravenous Q12H   metoprolol tartrate  25 mg Oral BID   pantoprazole (PROTONIX) IV  40 mg Intravenous Q12H   potassium chloride SA  20 mEq Oral BID   rosuvastatin  40 mg Oral Daily   sodium chloride flush  3 mL Intravenous Q12H   traZODone  100 mg Oral QHS   Continuous Infusions:  sodium chloride         LOS: 2 days    Time spent: 30 minutes    Ezekiel Slocumb, DO [facility]  10/06/2019, 6:30 PM    If 7PM-7AM, please contact night-coverage. How to contact the [facility] Attending or Consulting provider [place] or covering provider during after hours [place], for this patient?    Check the care team in [facility] and look for a) attending/consulting [facility] provider listed and b) the [facility] team listed Log into [email or web address] and use [facility] universal password to access. If you do not  have the password, please contact the hospital operator. Locate the [facility] provider you are looking for under [facility] and page to a number that you can be directly reached. If you still have difficulty reaching the provider, please page the [facility] (Director on Call) for the Hospitalists listed on amion for assistance.

## 2019-10-06 NOTE — Progress Notes (Signed)
Progress Note  Patient Name: Evelyn Simmons Date of Encounter: 10/06/2019  [facility] Cardiologist: Virl Axe, MD   Subjective   No acute events overnight.  Shortness of breath improved with Lasix.  Net -1.2 L over the past 24 hours.  Inpatient Medications    Scheduled Meds: . cholecalciferol  1,000 Units Oral Daily  . DULoxetine  30 mg Oral Daily  . furosemide  40 mg Intravenous Q12H  . metoprolol tartrate  25 mg Oral BID  . pantoprazole (PROTONIX) IV  40 mg Intravenous Q12H  . potassium chloride SA  20 mEq Oral BID  . rosuvastatin  40 mg Oral Daily  . sodium chloride flush  3 mL Intravenous Q12H  . traZODone  100 mg Oral QHS   Continuous Infusions: . sodium chloride     PRN Meds: sodium chloride, acetaminophen, albuterol, ALPRAZolam, cyclobenzaprine, HYDROcodone-acetaminophen, ipratropium-albuterol, ondansetron (ZOFRAN) IV, senna-docusate, sodium chloride flush, zolpidem   Vital Signs    Vitals:   10/05/19 1756 10/05/19 2006 10/06/19 0544 10/06/19 0545  BP: 138/78 (!) 121/55 (!) 96/52   Pulse: (!) 118 72 75   Resp: 19     Temp: 97.7 F (36.5 C) 98.5 F (36.9 C) 98.4 F (36.9 C)   TempSrc:  Oral Oral   SpO2: 100% 100% 93%   Weight: 78.2 kg   78.2 kg  Height: 4\' 11"  (1.499 m)       Intake/Output Summary (Last 24 hours) at 10/06/2019 1455 Last data filed at 10/06/2019 3734 Gross per 24 hour  Intake 120 ml  Output 500 ml  Net -380 ml   Last 3 Weights 10/06/2019 10/05/2019 10/04/2019  Weight (lbs) 172 lb 6.4 oz 172 lb 6.4 oz 175 lb  Weight (kg) 78.2 kg 78.2 kg 79.379 kg      Telemetry    Atrial fibrillation, paced at times, heart rate 98- Personally Reviewed  ECG     - Personally Reviewed  Physical Exam   GEN: No acute distress.   Neck: No JVD Cardiac:  Irregular irregular Respiratory: Clear to auscultation bilaterally. GI: Soft, nontender, non-distended  MS: No edema; No deformity. Neuro:  Nonfocal  Psych: Normal affect   Labs    High  Sensitivity Troponin:   Recent Labs  Lab 09/17/19 1055 09/17/19 1539 10/02/19 1032 10/04/19 0935 10/04/19 1600  TROPONINIHS 19* 21* 13 13 24*      Chemistry Recent Labs  Lab 10/02/19 1032 10/02/19 1032 10/04/19 0935 10/05/19 0605 10/06/19 0434  NA 140   < > 146* 143 143  K 4.3   < > 4.1 3.5 4.3  CL 102   < > 107 102 103  CO2 28   < > 29 29 33*  GLUCOSE 123*   < > 138* 105* 113*  BUN 25*   < > 25* 21 25*  CREATININE 1.59*   < > 1.31* 1.22* 1.41*  CALCIUM 10.1   < > 10.4* 10.3 9.7  PROT 6.4*  --   --   --  5.5*  ALBUMIN 3.5  --   --   --  2.9*  AST 47*  --   --   --  16  ALT 41  --   --   --  20  ALKPHOS 65  --   --   --  51  BILITOT 0.8  --   --   --  0.9  GFRNONAA 29*   < > 36* 40* 33*  GFRAA 33*   < >  42* 46* 38*  ANIONGAP 10   < > 10 12 7    < > = values in this interval not displayed.     Hematology Recent Labs  Lab 10/02/19 1032 10/02/19 1032 10/04/19 0935 10/04/19 0935 10/05/19 0605 10/05/19 1832 10/06/19 0434  WBC 5.7  --  5.3  --   --   --  5.5  RBC 3.29*  --  3.22*  --   --   --  2.99*  HGB 9.7*   < > 9.4*   < > 9.6* 9.8* 8.9*  HCT 31.7*   < > 31.1*   < > 30.4* 32.6* 29.2*  MCV 96.4  --  96.6  --   --   --  97.7  MCH 29.5  --  29.2  --   --   --  29.8  MCHC 30.6  --  30.2  --   --   --  30.5  RDW 15.0  --  15.6*  --   --   --  15.9*  PLT 285  --  265  --   --   --  244   < > = values in this interval not displayed.    BNP Recent Labs  Lab 10/02/19 1032 10/04/19 0935  BNP 233.6* 259.0*     DDimer No results for input(s): DDIMER in the last 168 hours.   Radiology    No results found.  Cardiac Studies   09/18/2019 1. Left ventricular ejection fraction, by estimation, is 45 to 50%. The  left ventricle has mildly decreased function. The left ventricle  demonstrates global hypokinesis. There is mild concentric left ventricular  hypertrophy. Left ventricular diastolic  parameters are indeterminate. Elevated left ventricular  end-diastolic  pressure.  2. Right ventricular systolic function is mildly reduced. The right  ventricular size is normal. There is mildly elevated pulmonary artery  systolic pressure.  3. Left atrial size was severely dilated.  4. Right atrial size was mildly dilated.  5. The mitral valve is normal in structure. Mild mitral valve  regurgitation. No evidence of mitral stenosis.  6. The aortic valve is tricuspid. Aortic valve regurgitation is not  visualized. No aortic stenosis is present.  7. The inferior vena cava is normal in size with <50% respiratory  variability, suggesting right atrial pressure of 8 mmHg.   Patient Profile     86 y.o. female with history of CAD prior stents to LAD and RCA, sick sinus syndrome status post pacemaker, COPD, persistent atrial fibrillation, being seen due to volume overload and A. fib with RVR.  Assessment & Plan    1.  Persistent atrial fibrillation -Heart rate seemed to be visible controlled, around 95-101 -Continue Toprol-XL 25 mg daily. -Low blood pressures preventing escalation of heart failure or rate control medications. -Previously intolerant to anticoagulation with anemia.  2.  Mildly reduced EF -Toprol-XL as above -Low blood pressure preventing addition of ACE/ARBs -cont lasix, net -1.2 L creatinine stable.  3. SSS Pacemaker -outpatient follow-up with EP/Dr. Caryl Comes.  Total encounter time 35 minutes  Greater than 50% was spent in counseling and coordination of care with the patient Can be discharged when volume is reasonably controlled/reduced.  Beta-blocker can be escalated as BP permits.      Signed, Kate Sable, MD  10/06/2019, 2:55 PM

## 2019-10-06 NOTE — Plan of Care (Signed)
Patient presently resting in the bed, remains alert and oriented, no distress  noted, still diuresing patient with IV lasix , no respiratory distress noted  .   Problem: Education: Goal: Knowledge of General Education information will improve Description: Including pain rating scale, medication(s)/side effects and non-pharmacologic comfort measures Outcome: Progressing   Problem: Clinical Measurements: Goal: Ability to maintain clinical measurements within normal limits will improve Outcome: Progressing   Problem: Safety: Goal: Ability to remain free from injury will improve Outcome: Progressing

## 2019-10-06 NOTE — Plan of Care (Signed)
  Problem: Education: Goal: Knowledge of General Education information will improve Description: Including pain rating scale, medication(s)/side effects and non-pharmacologic comfort measures Outcome: Progressing   Problem: Clinical Measurements: Goal: Respiratory complications will improve Outcome: Progressing   Problem: Safety: Goal: Ability to remain free from injury will improve Outcome: Progressing   

## 2019-10-06 NOTE — Hospital Course (Addendum)
Evelyn Simmons  is a 86 y.o. Caucasian female with a known history of COPD and chronic respiratory failure on home O2 at 3 L/min, type diabetes mellitus, systolic and diastolic CHF, atrial fibrillation, status post pacemaker, dyslipidemia, hypertension and hypothyroidism, who presented to the emergency room with worsening dyspnea, 3 pillow orthopnea and paroxysmal nocturnal dyspnea.  No significant edema.  She also reported been having dark stools, and some bright red bleeding per rectum prior to admission.  Has history of diverticulosis and diverticulitis.  Negative guaiac in the ED.   ED course: HR 112, RR 24, O2 sat 92% on RA, hypertensive 180/63, afebrile.  Notable labs Cr 1.35, BUN 25, BNP 259, Hbg down to 9.4 from 11 on 09/17/19. Chest xray showed "persistent cardiomegaly with pulmonary vascular congestion. Trace pleural effusions bilaterally. There may be a degree of underlying congestive heart failure.  There is airspace opacity in the left lower lung region concerning for superimposed developing pneumonia." EKG showed atrial fibrillation with RVRin 120's.    Recent 2D echo revealed an EF of 45 to 50% with mildly diminished systolic function, mild concentric LVH and inability to detect diastolic function, severe left atrial dilatation and mild right atrial dilatation.

## 2019-10-07 ENCOUNTER — Inpatient Hospital Stay: Payer: Medicare (Managed Care)

## 2019-10-07 DIAGNOSIS — I5041 Acute combined systolic (congestive) and diastolic (congestive) heart failure: Secondary | ICD-10-CM

## 2019-10-07 DIAGNOSIS — N179 Acute kidney failure, unspecified: Secondary | ICD-10-CM

## 2019-10-07 DIAGNOSIS — R0602 Shortness of breath: Secondary | ICD-10-CM

## 2019-10-07 LAB — URINALYSIS, COMPLETE (UACMP) WITH MICROSCOPIC
Bilirubin Urine: NEGATIVE
Glucose, UA: NEGATIVE mg/dL
Ketones, ur: NEGATIVE mg/dL
Nitrite: NEGATIVE
Protein, ur: NEGATIVE mg/dL
Specific Gravity, Urine: 1.012 (ref 1.005–1.030)
WBC, UA: >50 WBC/hpf — ABNORMAL HIGH (ref 0–5)
pH: 5 (ref 5.0–8.0)

## 2019-10-07 LAB — BASIC METABOLIC PANEL
Anion gap: 7 (ref 5–15)
BUN: 35 mg/dL — ABNORMAL HIGH (ref 8–23)
CO2: 32 mmol/L (ref 22–32)
Calcium: 10 mg/dL (ref 8.9–10.3)
Chloride: 100 mmol/L (ref 98–111)
Creatinine, Ser: 1.74 mg/dL — ABNORMAL HIGH (ref 0.44–1.00)
GFR calc Af Amer: 30 mL/min — ABNORMAL LOW (ref >60)
GFR calc non Af Amer: 26 mL/min — ABNORMAL LOW (ref >60)
Glucose, Bld: 141 mg/dL — ABNORMAL HIGH (ref 70–99)
Potassium: 5.2 mmol/L — ABNORMAL HIGH (ref 3.5–5.1)
Sodium: 139 mmol/L (ref 135–145)

## 2019-10-07 LAB — CBC
HCT: 30.2 % — ABNORMAL LOW (ref 36.0–46.0)
Hemoglobin: 9.3 g/dL — ABNORMAL LOW (ref 12.0–15.0)
MCH: 29.6 pg (ref 26.0–34.0)
MCHC: 30.8 g/dL (ref 30.0–36.0)
MCV: 96.2 fL (ref 80.0–100.0)
Platelets: 250 10*3/uL (ref 150–400)
RBC: 3.14 MIL/uL — ABNORMAL LOW (ref 3.87–5.11)
RDW: 15.8 % — ABNORMAL HIGH (ref 11.5–15.5)
WBC: 5.7 10*3/uL (ref 4.0–10.5)
nRBC: 0 % (ref 0.0–0.2)

## 2019-10-07 LAB — BRAIN NATRIURETIC PEPTIDE: B Natriuretic Peptide: 154.1 pg/mL — ABNORMAL HIGH (ref 0.0–100.0)

## 2019-10-07 LAB — MAGNESIUM: Magnesium: 2 mg/dL (ref 1.7–2.4)

## 2019-10-07 LAB — PROCALCITONIN: Procalcitonin: <0.1 ng/mL

## 2019-10-07 MED ORDER — POLYETHYLENE GLYCOL 3350 17 G PO PACK
17.0000 g | PACK | Freq: Every day | ORAL | Status: DC
  Administered 2019-10-07 – 2019-10-11 (×3): 17 g via ORAL
  Filled 2019-10-07 (×3): qty 1

## 2019-10-07 MED ORDER — BISACODYL 5 MG PO TBEC
5.0000 mg | DELAYED_RELEASE_TABLET | Freq: Every day | ORAL | Status: DC | PRN
  Administered 2019-10-07: 5 mg via ORAL
  Filled 2019-10-07: qty 1

## 2019-10-07 MED ORDER — PANTOPRAZOLE SODIUM 20 MG PO TBEC
20.0000 mg | DELAYED_RELEASE_TABLET | Freq: Every day | ORAL | Status: DC
  Administered 2019-10-08 – 2019-10-11 (×4): 20 mg via ORAL
  Filled 2019-10-07 (×4): qty 1

## 2019-10-07 NOTE — Plan of Care (Signed)
  Problem: Clinical Measurements: Goal: Ability to maintain clinical measurements within normal limits will improve Outcome: Progressing  VSS, Continues to diurese.

## 2019-10-07 NOTE — Progress Notes (Addendum)
PROGRESS NOTE    Evelyn Simmons   OEV:[id number]  DOB: Oct 04, 1929  PCP: Rosa Sanchez    DOA: 10/04/2019 LOS: 3   Brief Narrative   Evelyn Simmons  is a 86 y.o. Caucasian female with a known history of COPD and chronic respiratory failure on home O2 at 3 L/min, type diabetes mellitus, systolic and diastolic CHF, atrial fibrillation, status post pacemaker, dyslipidemia, hypertension and hypothyroidism, who presented to the emergency room with acute onset of worsening dyspnea over the last month and significantly worse over the last few days.  She came to the emergency room a couple of days ago and left before being seen.  She has been having chest congestion but denies any cough.  She was concerned about having fluid overload.  No fever chills.  No nausea vomiting or abdominal pain or diarrhea.  She admits to 3 pillow orthopnea and paroxysmal nocturnal dyspnea.  She denies any significant lower extremity edema.  She admits to dry cough without wheezing or hemoptysis.  No chest pain however she has been having palpitations.  No dysuria, oliguria or hematuria or flank pain.  She has been having dark stools and has noted bright red bleeding per rectum and mentions a history of diverticulosis and diverticulitis.  She had negative guaiac no.   Upon presentation to the ER, heart rate was 112 with respiratory rate of 24 and pulse oximetry 92% on room air with a blood pressure 180/63 and temperature 98.3.  Labs revealed a BUN of 25 and creatinine 1.35 compared to 25 and 1.592 days ago, BNP of 259 up from 233 then, high-sensitivity troponin I of 13 and later 24 and procalcitonin less than 0.1.  CBC showed anemia with hemoglobin of 9.4 hematocrit 31.1 close to her level 2 days ago however compared to 11 and 34.2 on 09/17/2019.  COVID-19 PCR is currently pending.  Showed her pacemaker attached to the right atrium and right ventricle, persistent cardiomegaly with pulmonary vascular congestion and trace  bilateral pleural effusions with suspected underlying CHF as well as an airspace opacity in the left lower lobe concerning for superimposed developing pneumonia.  It showed aortic atherosclerosis. EKG showed atrial fibrillation with a ventricular sponsor 126 and later 120 with nonspecific intraventricular conduction delay with left axis deviation, LVH and taste beats and triplets on her second EKG.   The patient's most recent 2D echo revealed an EF of 45 to 50% with mildly diminished systolic function, mild concentric LVH and inability to detect diastolic function, severe left atrial dilatation and mild right atrial dilatation.   The patient had dark stool but stool Hemoccult came back negative as above.   The patient was given 60 mg of IV Lasix.  She will be admitted to a progressive unit bed for further evaluation and management.     Assessment & Plan   Active Problems:   Acute CHF (congestive heart failure) (HCC)   Persistent atrial fibrillation (HCC)   Acute on chronic systolic and likely diastolic CHF - POA.  Presented with progressive dyspnea, orthopnea, PND, imaging showed pulmonary vascular congestion, all consistent with decompensated CHF.  Recent Echo on 8/7 showed EF 45-50%, indeterminate diastolic parameters, mild MR.   --Cardiology following -Hold IV Lasix given bump in creatinine.  May torsemide tomorrow given 2 admissions this month despite compliance with Lasix at home.   --strict I/O's and daily weights --continue metoprolol --no ACEI/ARB currently due to low BP's --Pt wants outpatient follow up with [facility] cardiology, Dr. Rockey Situ  Persistent A-fib with intermittent RVR - CHA2DS2-VASc score is 6.  Continue amiodaroneand will utilize IV drip as needed.  Monitor on telemetry.  Continue Toprol 25 mg PO daily.  Previously not on anticoagulation due to anemia.    GI bleeding - reported melena and some BRBPR on admission, with known history diverticulosis, this islikely lower GI  source.  Hbg is being trended and stable around 9 (down from 11 earlier this month). --continue trend H/H --transfuse if Hbg < 7.0 --GI was consulted - cancelled given negative FOBT and no BM's yet this admission --started stool softeners --will monitor closely and get GI if recurs.   -Hold aspirin and all anticoagulants for now --Stop IV Protonix, resume home PO Protonix  AKI - not present on admission.  Secondary to diuresis.  Renal US negative for obstruction or other cause.  Hold diuresis today.  Daily BMP to monitor.    CKD Stage IIIb - baseline Cr appears to be 1.2-1.3 recently.  Presented with Cr 1.59>>1.31>>1.22>>1.41 >>1.77 today.  Monitor closely with diuresis.  Avoid nephrotoxins and hypotension.  Renally dose meds as indicated.    Sick sinus syndrome - s/p pacemaker.  Follow up with EP, Dr. Caryl Comes.  Essential hypertension - chronic, stable. Continue Toprol.   Depression and anxiety - continue Cymbalta and Seroquel as well as Xanax.  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  Continue sliding scale NovoLog and Neurontin.  Continue basal insulin.    Dyslipidemia - continue statin   GERD - continue home Protonix  Obesity: Body mass index is 34.34 kg/m.  Complicates overall care and prognosis.   DVT prophylaxis: Place and maintain sequential compression device Start: 10/04/19 2327   Primary Care through [place] program.  Lindell Noe [phone].     Diet:  Diet Orders (From admission, onward)    Start     Ordered   10/04/19 1858  Diet Heart Room service appropriate? Yes; Fluid consistency: Thin  Diet effective now       Question Answer Comment  Room service appropriate? Yes   Fluid consistency: Thin      10/04/19 1906            Code Status: DNR    Subjective 10/07/19    Patient seen at bedside today.  Says she has a cough from sinus drainage.  Denies any fevers chills or deep coughing.  Says she is feeling  better.  Hopes to go home soon.  Agreeable to PT today.   Disposition Plan & Communication   Status is: Inpatient  Remains inpatient appropriate because:IV treatments appropriate due to intensity of illness or inability to take PO. Continues to undergo diuresis.  Hypoxic with ambulation.   Dispo: The patient is from: Home              Anticipated d/c is to: Home              Anticipated d/c date is: 2 days              Patient currently is not medically stable to d/c.   Family Communication: none at bedside, will attempt to call    Consults, Procedures, Significant Events   Consultants:   Cardiology  Gastroenterology  Procedures:   None    Objective   Vitals:   10/07/19 0100 10/07/19 0500 10/07/19 0522 10/07/19 0524  BP:   (!) 97/58 (!) 107/59  Pulse:   87 79  Resp: (!) 26 Marland Kitchen)  21 20   Temp:   97.9 F (36.6 C)   TempSrc:   Oral   SpO2:   98% 100%  Weight:   77.1 kg   Height:        Intake/Output Summary (Last 24 hours) at 10/07/2019 0807 Last data filed at 10/06/2019 2126 Gross per 24 hour  Intake --  Output 200 ml  Net -200 ml   Filed Weights   10/05/19 1756 10/06/19 0545 10/07/19 0522  Weight: 78.2 kg 78.2 kg 77.1 kg    Physical Exam:  General exam: awake, alert, no acute distress, obese Respiratory system: CTAB diminished bases, no wheezes, rales or rhonchi, normal respiratory effort. Cardiovascular system: normal S1/S2, RRR, no pedal edema.   Gastrointestinal system: soft, NT, ND, no HSM felt, +bowel sounds. Central nervous system: A&O x3. no gross focal neurologic deficits, normal speech Extremities: moves all, no cyanosis, normal tone Psychiatry: normal mood, congruent affect, judgement and insight appear normal  Labs   Data Reviewed: I have personally reviewed following labs and imaging studies  CBC: Recent Labs  Lab 10/02/19 1032 10/02/19 1032 10/04/19 0935 10/05/19 0605 10/05/19 1832 10/06/19 0434 10/07/19 0627  WBC 5.7  --  5.3   --   --  5.5 5.7  NEUTROABS  --   --   --   --   --  3.6  --   HGB 9.7*   < > 9.4* 9.6* 9.8* 8.9* 9.3*  HCT 31.7*   < > 31.1* 30.4* 32.6* 29.2* 30.2*  MCV 96.4  --  96.6  --   --  97.7 96.2  PLT 285  --  265  --   --  244 250   < > = values in this interval not displayed.   Basic Metabolic Panel: Recent Labs  Lab 10/02/19 1032 10/04/19 0935 10/05/19 0605 10/06/19 0434 10/07/19 0627  NA 140 146* 143 143 139  K 4.3 4.1 3.5 4.3 5.2*  CL 102 107 102 103 100  CO2 28 29 29  33* 32  GLUCOSE 123* 138* 105* 113* 141*  BUN 25* 25* 21 25* 35*  CREATININE 1.59* 1.31* 1.22* 1.41* 1.74*  CALCIUM 10.1 10.4* 10.3 9.7 10.0  MG  --  2.0  --  1.8 2.0  PHOS  --   --   --  3.0  --    GFR: Estimated Creatinine Clearance: 20 mL/min (A) (by C-G formula based on SCr of 1.74 mg/dL (H)). Liver Function Tests: Recent Labs  Lab 10/02/19 1032 10/06/19 0434  AST 47* 16  ALT 41 20  ALKPHOS 65 51  BILITOT 0.8 0.9  PROT 6.4* 5.5*  ALBUMIN 3.5 2.9*   No results for input(s): LIPASE, AMYLASE in the last 168 hours. No results for input(s): AMMONIA in the last 168 hours. Coagulation Profile: No results for input(s): INR, PROTIME in the last 168 hours. Cardiac Enzymes: No results for input(s): CKTOTAL, CKMB, CKMBINDEX, TROPONINI in the last 168 hours. BNP (last 3 results) No results for input(s): PROBNP in the last 8760 hours. HbA1C: No results for input(s): HGBA1C in the last 72 hours. CBG: Recent Labs  Lab 10/06/19 0612  GLUCAP 125*   Lipid Profile: No results for input(s): CHOL, HDL, LDLCALC, TRIG, CHOLHDL, LDLDIRECT in the last 72 hours. Thyroid Function Tests: Recent Labs    10/04/19 0935  TSH 3.163   Anemia Panel: No results for input(s): VITAMINB12, FOLATE, FERRITIN, TIBC, IRON, RETICCTPCT in the last 72 hours. Sepsis Labs: Recent Labs  Lab  10/04/19 0935  PROCALCITON <0.10    Recent Results (from the past 240 hour(s))  SARS Coronavirus 2 by RT PCR (hospital order, performed  in [facility] hospital lab) Nasopharyngeal Nasopharyngeal Swab     Status: None   Collection Time: 10/04/19  6:56 PM   Specimen: Nasopharyngeal Swab  Result Value Ref Range Status   SARS Coronavirus 2 NEGATIVE NEGATIVE Final    Comment: (NOTE) SARS-CoV-2 target nucleic acids are NOT DETECTED.  The SARS-CoV-2 RNA is generally detectable in upper and lower respiratory specimens during the acute phase of infection. The lowest concentration of SARS-CoV-2 viral copies this assay can detect is 250 copies / mL. A negative result does not preclude SARS-CoV-2 infection and should not be used as the sole basis for treatment or other patient management decisions.  A negative result may occur with improper specimen collection / handling, submission of specimen other than nasopharyngeal swab, presence of viral mutation(s) within the areas targeted by this assay, and inadequate number of viral copies (<250 copies / mL). A negative result must be combined with clinical observations, patient history, and epidemiological information.  Fact Sheet for Patients:   StrictlyIdeas.no  Fact Sheet for Healthcare Providers: BankingDealers.co.za  This test is not yet approved or  cleared by the [place] FDA and has been authorized for detection and/or diagnosis of SARS-CoV-2 by FDA under an Emergency Use Authorization (EUA).  This EUA will remain in effect (meaning this test can be used) for the duration of the COVID-19 declaration under Section 564(b)(1) of the Act, 21 U.S.C. section 360bbb-3(b)(1), unless the authorization is terminated or revoked sooner.  Performed at [facility], [address] [place], [place] [address]       Imaging Studies   No results found.   Medications   Scheduled Meds: . cholecalciferol  1,000 Units Oral Daily  . DULoxetine  30 mg Oral Daily  . metoprolol tartrate  25 mg Oral BID  . pantoprazole  (PROTONIX) IV  40 mg Intravenous Q12H  . potassium chloride SA  20 mEq Oral BID  . rosuvastatin  40 mg Oral Daily  . sodium chloride flush  3 mL Intravenous Q12H  . traZODone  100 mg Oral QHS   Continuous Infusions: . sodium chloride         LOS: 3 days    Time spent: 25 minutes    Ezekiel Slocumb, DO [facility]  10/07/2019, 8:07 AM    If 7PM-7AM, please contact night-coverage. How to contact the [facility] Attending or Consulting provider [place] or covering provider during after hours [place], for this patient?    1. Check the care team in [facility] and look for a) attending/consulting [facility] provider listed and b) the [facility] team listed 2. Log into [email or web address] and use [place]'s universal password to access. If you do not have the password, please contact the hospital operator. 3. Locate the [facility] provider you are looking for under [facility] and page to a number that you can be directly reached. 4. If you still have difficulty reaching the provider, please page the [facility] (Director on Call) for the Hospitalists listed on amion for assistance.

## 2019-10-07 NOTE — Care Management Important Message (Signed)
Important Message  Patient Details  Name: Evelyn Simmons MRN: [id number] Date of Birth: 01/12/1930   Medicare Important Message Given:  Yes     Dannette Barbara 10/07/2019, 1:40 PM

## 2019-10-07 NOTE — TOC Progression Note (Signed)
Transition of Care [facility]) - Progression Note    Patient Details  Name: Evelyn Simmons MRN: [id number] Date of Birth: 05/06/1929  Transition of Care [facility]) CM/SW [place], RN Phone Number: 10/07/2019, 1:51 PM  Clinical Narrative:     Damaris Schooner with Nurse Juliann Pulse, she was checking on status of patient and wanted to give me the message the Dr Dionicia Abler is trying to get in touch with Attending.   Expected Discharge Plan: Home/Self Care Barriers to Discharge: Continued Medical Work up  Expected Discharge Plan and Services Expected Discharge Plan: Home/Self Care       Living arrangements for the past 2 months: Single Family Home                                       Social Determinants of Health (SDOH) Interventions    Readmission Risk Interventions No flowsheet data found.

## 2019-10-07 NOTE — Progress Notes (Signed)
Progress Note  Patient Name: Evelyn Simmons Date of Encounter: 10/07/2019  [facility] Cardiologist: Virl Axe, MD   Subjective   Reports that she is feeling somewhat better, breathing has improved with diuresis Discussed recent hospital course with her from 2 weeks ago and again this admission She has not been seen by outpatient cardiology either through our clinic or the [facility] She does not want to see cardiology through the [facility] and indicates today she would prefer to see [facility] Reports that she declined going to [facility] even when symptoms got worse She does want to stay with her primary care physician through [facility]  Denies significant leg swelling,  Reports that she only uses oxygen as needed at home Reports her baseline weight is 176, on the board weight is 170. "  They pulled all the fluid out"  Reports that she has very high fluid intake, several cups of coffee per day, Coca-Cola, lots of water She has not tried to moderate her fluid intake at home Reports compliance with her Lasix 40 twice daily. "  I take 2 a day a.m. and p.m."  Feels that she is relatively asymptomatic from her atrial fibrillation Notes indicating rate variable predominantly 80-90  Echocardiogram reviewed with her from September 18, 2019, ejection fraction 45 to 50%, RV function mildly reduced, mildly elevated right heart pressures, severely dilated left atrium, mildly dilated IVC  Inpatient Medications    Scheduled Meds: . cholecalciferol  1,000 Units Oral Daily  . DULoxetine  30 mg Oral Daily  . metoprolol tartrate  25 mg Oral BID  . pantoprazole (PROTONIX) IV  40 mg Intravenous Q12H  . polyethylene glycol  17 g Oral Daily  . rosuvastatin  40 mg Oral Daily  . sodium chloride flush  3 mL Intravenous Q12H  . traZODone  100 mg Oral QHS   Continuous Infusions: . sodium chloride     PRN Meds: sodium chloride, acetaminophen, albuterol, ALPRAZolam, bisacodyl,  cyclobenzaprine, HYDROcodone-acetaminophen, ipratropium-albuterol, ondansetron (ZOFRAN) IV, senna-docusate, sodium chloride flush, zolpidem   Vital Signs    Vitals:   10/07/19 0522 10/07/19 0524 10/07/19 0921 10/07/19 1145  BP: (!) 97/58 (!) 107/59 (!) 115/52 113/72  Pulse: 87 79 (!) 110 69  Resp: 20  18 19   Temp: 97.9 F (36.6 C)  98.3 F (36.8 C) 98.4 F (36.9 C)  TempSrc: Oral  Oral Oral  SpO2: 98% 100% 100% 100%  Weight: 77.1 kg     Height:        Intake/Output Summary (Last 24 hours) at 10/07/2019 1402 Last data filed at 10/07/2019 7564 Gross per 24 hour  Intake --  Output 700 ml  Net -700 ml   Last 3 Weights 10/07/2019 10/06/2019 10/05/2019  Weight (lbs) 170 lb 172 lb 6.4 oz 172 lb 6.4 oz  Weight (kg) 77.111 kg 78.2 kg 78.2 kg      Telemetry    Paced rhythm with underlying atrial fibrillation- Personally Reviewed  ECG     - Personally Reviewed  Physical Exam   GEN: No acute distress.   Neck: No JVD Cardiac:  Irregularly irregular no murmurs, rubs, or gallops.  Respiratory: Clear to auscultation bilaterally. GI: Soft, nontender, non-distended  MS: No edema; No deformity. Neuro:  Nonfocal  Psych: Normal affect   Labs    High Sensitivity Troponin:   Recent Labs  Lab 09/17/19 1055 09/17/19 1539 10/02/19 1032 10/04/19 0935 10/04/19 1600  TROPONINIHS 19* 21* 13 13 24*  Chemistry Recent Labs  Lab 10/02/19 1032 10/04/19 0935 10/05/19 0605 10/06/19 0434 10/07/19 0627  NA 140   < > 143 143 139  K 4.3   < > 3.5 4.3 5.2*  CL 102   < > 102 103 100  CO2 28   < > 29 33* 32  GLUCOSE 123*   < > 105* 113* 141*  BUN 25*   < > 21 25* 35*  CREATININE 1.59*   < > 1.22* 1.41* 1.74*  CALCIUM 10.1   < > 10.3 9.7 10.0  PROT 6.4*  --   --  5.5*  --   ALBUMIN 3.5  --   --  2.9*  --   AST 47*  --   --  16  --   ALT 41  --   --  20  --   ALKPHOS 65  --   --  51  --   BILITOT 0.8  --   --  0.9  --   GFRNONAA 29*   < > 40* 33* 26*  GFRAA 33*   < > 46* 38*  30*  ANIONGAP 10   < > 12 7 7    < > = values in this interval not displayed.     Hematology Recent Labs  Lab 10/04/19 0935 10/05/19 1610 10/05/19 1832 10/06/19 0434 10/07/19 0627  WBC 5.3  --   --  5.5 5.7  RBC 3.22*  --   --  2.99* 3.14*  HGB 9.4*   < > 9.8* 8.9* 9.3*  HCT 31.1*   < > 32.6* 29.2* 30.2*  MCV 96.6  --   --  97.7 96.2  MCH 29.2  --   --  29.8 29.6  MCHC 30.2  --   --  30.5 30.8  RDW 15.6*  --   --  15.9* 15.8*  PLT 265  --   --  244 250   < > = values in this interval not displayed.    BNP Recent Labs  Lab 10/02/19 1032 10/04/19 0935 10/07/19 0627  BNP 233.6* 259.0* 154.1*     DDimer No results for input(s): DDIMER in the last 168 hours.   Radiology    US RENAL  Result Date: 10/07/2019 CLINICAL DATA:  Acute kidney injury EXAM: RENAL / URINARY TRACT ULTRASOUND COMPLETE COMPARISON:  None. FINDINGS: Right Kidney: Renal measurements: 9.2 x 5.0 x 4.2 cm = volume: 101.0 mL. Echogenicity is increased. Renal cortical thickness is low normal. No perinephric fluid or hydronephrosis visualized. There is a cyst in the upper to mid right kidney measuring 1.1 x 1.2 x 1.4 cm. No sonographically demonstrable calculus or ureterectasis. Left Kidney: Renal measurements: 9.5 x 4.3 x 4.1 cm = volume: 86.1 mL. Echogenicity is increased. The renal cortical thickness is low. No perinephric fluid or hydronephrosis visualized. There is a cyst in the upper pole left kidney measuring 1.0 x 0.8 x 0.8 cm. There is a cyst in the lower pole left kidney measuring 1.0 x 0.7 x 0.9 cm. No sonographically demonstrable calculus or ureterectasis. Bladder: The urinary bladder wall thickness is normal. There is suspected mild debris in the dependent portion of the bladder. Flow from each distal ureter seen within the bladder. Other: None. IMPRESSION: 1. Increased renal echogenicity bilaterally, a finding indicative of medical renal disease. No obstructing focus in either kidney. 2.  Small renal cysts  bilaterally. 3. Question debris within the bladder. Correlation with urinalysis in this regard advised. Electronically Signed  By: Lowella Grip III M.D.   On: 10/07/2019 09:14    Cardiac Studies   Echocardiogram as above from September 18, 2019  Patient Profile     86 y.o. female with history of CAD prior stents to LAD and RCA, sick sinus syndrome status post pacemaker, COPD, persistent atrial fibrillation, being seen due to volume overload and A. fib with RVR.  Assessment & Plan    1.  Persistent atrial fibrillation On metoprolol tartrate 25 twice daily, blood pressure borderline low Not on amiodarone, has not been on anticoagulation Not a great candidate for digoxin given her age We will continue to monitor pressure, might be able to titrate an additional small dose perhaps 37.5 mg twice daily if blood pressure stabilizes in the next 24 hours. --Atrial fibrillation likely contributing to recurrent CHF symptoms though she does have very high fluid intake  2.   Acute on chronic diastolic and systolic CHF On metoprolol, not on ACE or ARB given low pressure --Long discussion concerning need to moderate her fluid intake, she has high-volume coffee, water, Coca-Cola daily Reports compliance with her Lasix 40 twice daily.  Given recent failures, 2 admissions in the past month, --- We will need to consider torsemide 40 in the morning, 20 in the afternoon alternating every other day with 20 twice daily with close monitoring of renal function  3. SSS Pacemaker -outpatient follow-up with EP/Dr. Caryl Comes.  Acute on chronic renal failure Climbing BUN/creatinine likely prerenal state, Lasix now on hold. Would  repeat BMP tomorrow before starting oral diuretics   Long discussion with her concerning recurrent admissions for CHF, discussed behaviors, volume intake, daily weighing, adjustment of diuretics  Total encounter time more than 45 minutes  Greater than 50% was spent in counseling and  coordination of care with the patient    For questions or updates, please contact [place] Please consult [email or web address] for contact info under        Signed, Ida Rogue, MD  10/07/2019, 2:02 PM

## 2019-10-07 NOTE — Evaluation (Signed)
Physical Therapy Evaluation Patient Details Name: Evelyn Simmons MRN: [id number] DOB: 29-Apr-1929 Today's Date: 10/07/2019   History of Present Illness  Evelyn Simmons is an 24yoF who comes [facility] on 8[facility] c SOB. Pt admitted in combined CHF exacerbation, also noted to have AF c RVR. PMH: COPD, CRF on 2L/min O2 PRN at home, combined CHF, AF, PPM, HTN, hypoTSH. Pt is a [facility] participant, mostly a household ambulator with [id number].  Clinical Impression  Pt admitted with above diagnosis. Pt currently with functional limitations due to the deficits listed below (see "PT Problem List"). Upon entry, pt in bed, awake and agreeable to participate. Minguard assist bed mobility, transfers, and AMB. AMB limited to ~53ft 2/2 DOE and desaturation to 79%. HR also elevated to 123 after AMB. Functional mobility assessment demonstrates increased effort/time requirements, poor tolerance, and need for physical assistance, whereas the patient performed these at a higher level of independence PTA. Pt typically able to AMB on room air ad lib. Pt has been inactive for several weeks since dyspnea issues began, hence she feels more weak, unsteady, and unsafe at home at present. Pt will benefit from skilled PT intervention to increase independence and safety with basic mobility in preparation for discharge to the venue listed below.       Follow Up Recommendations SNF;Supervision - Intermittent    Equipment Recommendations  None recommended by PT    Recommendations for Other Services       Precautions / Restrictions Precautions Precautions: Fall Precaution Comments: O2 sats Restrictions Weight Bearing Restrictions: No      Mobility  Bed Mobility Overal bed mobility: Needs Assistance Bed Mobility: Supine to Sit;Sit to Supine       Sit to supine: Mod assist   General bed mobility comments: needs assist with legs into bed  Transfers Overall transfer level: Needs assistance Equipment used: Rolling walker (2  wheeled) Transfers: Sit to/from Stand Sit to Stand: Min guard         General transfer comment: unsteady inititally  Ambulation/Gait Ambulation/Gait assistance: Min Gaffer (Feet): 50 Feet Assistive device: Rolling walker (2 wheeled)       General Gait Details: mostly steady with RW, no gross LOB; has significant SOB, 79% on room air whence awssessed.  Stairs            Wheelchair Mobility    Modified Rankin (Stroke Patients Only)       Balance Overall balance assessment: Modified Independent (not at baseline per patient)                                           Pertinent Vitals/Pain Pain Assessment: No/denies pain    Home Living Family/patient expects to be discharged to:: Private residence Living Arrangements: Alone Available Help at Discharge: Personal care attendant (Mon/Thur 2hrs; [facility] participant) Type of Home: Apartment Home Access: Level entry     Home Layout: One level Home Equipment: Wheelchair - manual;Cane - single point;Walker - 4 wheels Additional Comments: Pt is a PACE participant    Prior Function Level of Independence: Needs assistance   Gait / Transfers Assistance Needed: household AMB c rollator  ADL's / Homemaking Assistance Needed: uses O2 as needed since remote MI; AMB outside foractivity twice 2 weekly.  Comments: independent c bathing, dressing; no falls recently, generally confident with household distance.     Hand Dominance  Extremity/Trunk Assessment   Upper Extremity Assessment Upper Extremity Assessment: Generalized weakness    Lower Extremity Assessment Lower Extremity Assessment: Generalized weakness       Communication      Cognition Arousal/Alertness: Awake/alert Behavior During Therapy: WFL for tasks assessed/performed Overall Cognitive Status: Within Functional Limits for tasks assessed                                        General  Comments      Exercises     Assessment/Plan    PT Assessment Patient needs continued PT services  PT Problem List Decreased strength;Decreased range of motion;Decreased activity tolerance;Decreased balance;Decreased mobility;Decreased coordination;Decreased knowledge of precautions       PT Treatment Interventions Balance training;Gait training;Functional mobility training;Therapeutic activities;Therapeutic exercise;Patient/family education    PT Goals (Current goals can be found in the Care Plan section)  Acute Rehab PT Goals Patient Stated Goal: regain strength and balance PT Goal Formulation: With patient Time For Goal Achievement: 10/21/19 Potential to Achieve Goals: Good    Frequency Min 2X/week   Barriers to discharge Decreased caregiver support      Co-evaluation               AM-PAC PT "6 Clicks" Mobility  Outcome Measure Help needed turning from your back to your side while in a flat bed without using bedrails?: None Help needed moving from lying on your back to sitting on the side of a flat bed without using bedrails?: None Help needed moving to and from a bed to a chair (including a wheelchair)?: A Little Help needed standing up from a chair using your arms (e.g., wheelchair or bedside chair)?: A Little Help needed to walk in hospital room?: A Little Help needed climbing 3-5 steps with a railing? : A Lot 6 Click Score: 19    End of Session Equipment Utilized During Treatment: Gait belt;Oxygen Activity Tolerance: Patient tolerated treatment well;No increased pain;Treatment limited secondary to medical complications (Comment) Patient left: in bed;with call bell/phone within reach;with bed alarm set;with nursing/sitter in room Nurse Communication: Mobility status PT Visit Diagnosis: Unsteadiness on feet (R26.81);Difficulty in walking, not elsewhere classified (R26.2);Other abnormalities of gait and mobility (R26.89);Muscle weakness (generalized)  (M62.81);Dizziness and giddiness (R42)    Time: 1354-1440 PT Time Calculation (min) (ACUTE ONLY): 46 min   Charges:   PT Evaluation $PT Eval Moderate Complexity: 1 Mod PT Treatments $Therapeutic Exercise: 8-22 mins        4:01 PM, 10/07/19 Etta Grandchild, PT, DPT Physical Therapist - [facility]  [phone] ([place])    [place] C 10/07/2019, 3:59 PM

## 2019-10-08 LAB — CBC
HCT: 29 % — ABNORMAL LOW (ref 36.0–46.0)
Hemoglobin: 8.6 g/dL — ABNORMAL LOW (ref 12.0–15.0)
MCH: 29.2 pg (ref 26.0–34.0)
MCHC: 29.7 g/dL — ABNORMAL LOW (ref 30.0–36.0)
MCV: 98.3 fL (ref 80.0–100.0)
Platelets: 229 10*3/uL (ref 150–400)
RBC: 2.95 MIL/uL — ABNORMAL LOW (ref 3.87–5.11)
RDW: 15.5 % (ref 11.5–15.5)
WBC: 6.1 10*3/uL (ref 4.0–10.5)
nRBC: 0 % (ref 0.0–0.2)

## 2019-10-08 LAB — BASIC METABOLIC PANEL
Anion gap: 1 — ABNORMAL LOW (ref 5–15)
BUN: 35 mg/dL — ABNORMAL HIGH (ref 8–23)
CO2: 35 mmol/L — ABNORMAL HIGH (ref 22–32)
Calcium: 10.6 mg/dL — ABNORMAL HIGH (ref 8.9–10.3)
Chloride: 102 mmol/L (ref 98–111)
Creatinine, Ser: 1.63 mg/dL — ABNORMAL HIGH (ref 0.44–1.00)
GFR calc Af Amer: 32 mL/min — ABNORMAL LOW (ref >60)
GFR calc non Af Amer: 28 mL/min — ABNORMAL LOW (ref >60)
Glucose, Bld: 136 mg/dL — ABNORMAL HIGH (ref 70–99)
Potassium: 6 mmol/L — ABNORMAL HIGH (ref 3.5–5.1)
Sodium: 138 mmol/L (ref 135–145)

## 2019-10-08 LAB — MAGNESIUM: Magnesium: 1.9 mg/dL (ref 1.7–2.4)

## 2019-10-08 LAB — POTASSIUM: Potassium: 5.8 mmol/L — ABNORMAL HIGH (ref 3.5–5.1)

## 2019-10-08 MED ORDER — SENNA 8.6 MG PO TABS
1.0000 | ORAL_TABLET | Freq: Every day | ORAL | Status: DC
  Administered 2019-10-08 – 2019-10-10 (×3): 8.6 mg via ORAL
  Filled 2019-10-08 (×3): qty 1

## 2019-10-08 MED ORDER — SODIUM ZIRCONIUM CYCLOSILICATE 10 G PO PACK
10.0000 g | PACK | Freq: Three times a day (TID) | ORAL | Status: DC
  Administered 2019-10-08 – 2019-10-09 (×5): 10 g via ORAL
  Filled 2019-10-08 (×6): qty 1

## 2019-10-08 NOTE — Progress Notes (Signed)
OT Cancellation Note  Patient Details Name: Evelyn Simmons MRN: [id number] DOB: 18-Sep-1929   Cancelled Treatment:    Reason Eval/Treat Not Completed: Medical issues which prohibited therapy  OT consult received and chart reviewed. Upon chart review this date, noted that pt's K+ trending up (5.2 yesterday, now 6.0) falling outside of recommended therapy guidelines. Per care team, medication being ordered, but not yet administered. Will f/u for OT evaluation when pt more appropriate for participation. Thank you.  Gerrianne Scale, [place], OTR/L ascom [phone] 10/08/19, 11:34 AM

## 2019-10-08 NOTE — NC FL2 (Signed)
[place] LEVEL OF CARE SCREENING TOOL     IDENTIFICATION  Patient Name: Evelyn Simmons Birthdate: [birth year] Sex: female Admission Date (Current Location): 10/04/2019  [place] and [place] Number:  Engineering geologist and Address:  [facility], [address], [place], [place] [address]      Provider Number: [id number]  Attending Physician Name and Address:  Ezekiel Slocumb, DO  Relative Name and Phone Number:  Merilyn Baba [phone]    Current Level of Care: Hospital Recommended Level of Care: [place] Prior Approval Number:    Date Approved/Denied:   PASRR Number: [id number]  Discharge Plan: SNF    Current Diagnoses: Patient Active Problem List   Diagnosis Date Noted  . Persistent atrial fibrillation ([place])   . Acute CHF (congestive heart failure) ([place]) 10/04/2019  . Chronic respiratory failure with hypoxia ([place])   . CHF (congestive heart failure) ([place]) 09/18/2019  . Atrial fibrillation with rapid ventricular response ([place]) 09/17/2019  . Acute diastolic CHF (congestive heart failure) ([place]) 09/17/2019  . Mobility impaired 05/29/2014  . CAD (coronary artery disease) 05/09/2014  . History of coronary artery stent placement 05/09/2014  . Hyperlipidemia 05/09/2014  . Bilateral low back pain with left-sided sciatica 04/21/2014  . Knee effusion 04/21/2014  . CKD stage 3 due to type 2 diabetes mellitus ([place]) 04/21/2014  . Pacemaker 04/21/2014  . Encounter to establish care 04/20/2014    Orientation RESPIRATION BLADDER Height & Weight     Self, Time, Situation  Normal Incontinent Weight: 78 kg Height:  4\' 11"  (149.9 cm)  BEHAVIORAL SYMPTOMS/MOOD NEUROLOGICAL BOWEL NUTRITION STATUS      Continent Diet  AMBULATORY STATUS COMMUNICATION OF NEEDS Skin   Extensive Assist Verbally Normal                       Personal Care Assistance Level of Assistance  Bathing, Dressing Bathing Assistance: Limited  assistance   Dressing Assistance: Limited assistance     Functional Limitations Info  Sight Sight Info: Adequate (glasses)        SPECIAL CARE FACTORS FREQUENCY  PT (By licensed PT), OT (By licensed OT)     PT Frequency: 5x a week OT Frequency: 5x a week            Contractures Contractures Info: Not present    Additional Factors Info  Allergies Code Status Info: DNR Allergies Info: penicillin, sulfa, penicillin           Current Medications (10/08/2019):  This is the current hospital active medication list Current Facility-Administered Medications  Medication Dose Route Frequency Provider Last Rate Last Admin  . 0.9 %  sodium chloride infusion  250 mL Intravenous PRN Mansy, Jan A, MD      . acetaminophen (TYLENOL) tablet 650 mg  650 mg Oral Q4H PRN Mansy, Jan A, MD   650 mg at 10/08/19 1056  . albuterol (PROVENTIL) (2.5 MG/3ML) 0.083% nebulizer solution 2.5 mg  2.5 mg Inhalation Q6H PRN Mansy, Jan A, MD      . ALPRAZolam Duanne Moron) tablet 0.25 mg  0.25 mg Oral BID PRN Mansy, Jan A, MD   0.25 mg at 10/05/19 0029  . bisacodyl (DULCOLAX) EC tablet 5 mg  5 mg Oral Daily PRN Nicole Kindred A, DO   5 mg at 10/07/19 1156  . cholecalciferol (VITAMIN D3) tablet 1,000 Units  1,000 Units Oral Daily Mansy, Jan A, MD   1,000 Units at 10/08/19 1057  .  cyclobenzaprine (FLEXERIL) tablet 5 mg  5 mg Oral TID PRN Mansy, Jan A, MD   5 mg at 10/08/19 1057  . DULoxetine (CYMBALTA) DR capsule 30 mg  30 mg Oral Daily Mansy, Jan A, MD   30 mg at 10/08/19 1056  . HYDROcodone-acetaminophen (NORCO) 10-325 MG per tablet 1 tablet  1 tablet Oral Q4H PRN Mansy, Jan A, MD      . ipratropium-albuterol (DUONEB) 0.5-2.5 (3) MG/3ML nebulizer solution 3 mL  3 mL Nebulization Q4H PRN Mansy, Jan A, MD      . metoprolol tartrate (LOPRESSOR) tablet 25 mg  25 mg Oral BID Theora Gianotti, NP   25 mg at 10/08/19 1057  . ondansetron (ZOFRAN) injection 4 mg  4 mg Intravenous Q6H PRN Mansy, Jan A, MD      .  pantoprazole (PROTONIX) EC tablet 20 mg  20 mg Oral Daily Nicole Kindred A, DO   20 mg at 10/08/19 1057  . polyethylene glycol (MIRALAX / GLYCOLAX) packet 17 g  17 g Oral Daily Nicole Kindred A, DO   17 g at 10/08/19 1056  . rosuvastatin (CRESTOR) tablet 40 mg  40 mg Oral Daily Mansy, Jan A, MD   40 mg at 10/07/19 1602  . senna-docusate (Senokot-S) tablet 1 tablet  1 tablet Oral QHS PRN Mansy, Jan A, MD      . sodium chloride flush (NS) 0.9 % injection 3 mL  3 mL Intravenous Q12H Mansy, Jan A, MD   3 mL at 10/08/19 1057  . sodium chloride flush (NS) 0.9 % injection 3 mL  3 mL Intravenous PRN Mansy, Jan A, MD      . sodium zirconium cyclosilicate (LOKELMA) packet 10 g  10 g Oral TID Nicole Kindred A, DO      . traZODone (DESYREL) tablet 100 mg  100 mg Oral QHS Mansy, Jan A, MD   100 mg at 10/07/19 2152  . zolpidem (AMBIEN) tablet 5 mg  5 mg Oral QHS PRN Mansy, Arvella Merles, MD         Discharge Medications: Please see discharge summary for a list of discharge medications.  Relevant Imaging Results:  Relevant Lab Results:   Additional Information [id number]  Victorino Dike, RN

## 2019-10-08 NOTE — Progress Notes (Signed)
Progress Note  Patient Name: Evelyn Simmons Date of Encounter: 10/08/2019  [facility] Cardiologist: Virl Axe, MD   Subjective   Reports breathing is stable, Worked with PT yesterday, legs were very weak PT recommended rehab She prefers to go home though reports she does not have any help at home Again very adamant about not seeing cardiology through [facility]. "  I pay the bill, I decide" She would like follow-up with Dr. Caryl Comes, EP and to be followed by our clinic   Inpatient Medications    Scheduled Meds: . cholecalciferol  1,000 Units Oral Daily  . DULoxetine  30 mg Oral Daily  . metoprolol tartrate  25 mg Oral BID  . pantoprazole  20 mg Oral Daily  . polyethylene glycol  17 g Oral Daily  . rosuvastatin  40 mg Oral Daily  . sodium chloride flush  3 mL Intravenous Q12H  . sodium zirconium cyclosilicate  10 g Oral TID  . traZODone  100 mg Oral QHS   Continuous Infusions: . sodium chloride     PRN Meds: sodium chloride, acetaminophen, albuterol, ALPRAZolam, bisacodyl, cyclobenzaprine, HYDROcodone-acetaminophen, ipratropium-albuterol, ondansetron (ZOFRAN) IV, senna-docusate, sodium chloride flush, zolpidem   Vital Signs    Vitals:   10/07/19 2024 10/08/19 0430 10/08/19 0829 10/08/19 1132  BP: (!) 122/57 102/64 117/66 104/81  Pulse: 99 98 79 76  Resp: 20 20 19 19   Temp: 98.4 F (36.9 C) 98.6 F (37 C) 98.3 F (36.8 C) 97.7 F (36.5 C)  TempSrc: Oral Oral Oral Oral  SpO2: 97% 96% 95% 92%  Weight:  78 kg    Height:        Intake/Output Summary (Last 24 hours) at 10/08/2019 1411 Last data filed at 10/08/2019 1056 Gross per 24 hour  Intake 240 ml  Output 250 ml  Net -10 ml   Last 3 Weights 10/08/2019 10/07/2019 10/06/2019  Weight (lbs) 172 lb 170 lb 172 lb 6.4 oz  Weight (kg) 78.019 kg 77.111 kg 78.2 kg      Telemetry    Paced rhythm with underlying atrial fibrillation- Personally Reviewed  ECG     - Personally Reviewed  Physical Exam    Constitutional:  oriented to person, place, and time. No distress. Obese HENT:  Head: Normocephalic and atraumatic.  Eyes:  no discharge. No scleral icterus.  Neck: Normal range of motion. Neck supple. No JVD present.  Cardiovascular: Irregularly irregular normal heart sounds and intact distal pulses. Exam reveals no gallop and no friction rub. No edema No murmur heard. Pulmonary/Chest: Effort normal and breath sounds normal. No stridor. No respiratory distress.  no wheezes.  no rales.  no tenderness.  Abdominal: Soft.  no distension.  no tenderness.  Musculoskeletal: Normal range of motion.  no  tenderness or deformity.  Neurological:  normal muscle tone. Coordination normal. No atrophy Skin: Skin is warm and dry. No rash noted. not diaphoretic.  Psychiatric:  normal mood and affect. behavior is normal. Thought content normal.   Labs    High Sensitivity Troponin:   Recent Labs  Lab 09/17/19 1055 09/17/19 1539 10/02/19 1032 10/04/19 0935 10/04/19 1600  TROPONINIHS 19* 21* 13 13 24*      Chemistry Recent Labs  Lab 10/02/19 1032 10/04/19 0935 10/06/19 0434 10/07/19 0627 10/08/19 0718  NA 140   < > 143 139 138  K 4.3   < > 4.3 5.2* 6.0*  CL 102   < > 103 100 102  CO2 28   < >  33* 32 35*  GLUCOSE 123*   < > 113* 141* 136*  BUN 25*   < > 25* 35* 35*  CREATININE 1.59*   < > 1.41* 1.74* 1.63*  CALCIUM 10.1   < > 9.7 10.0 10.6*  PROT 6.4*  --  5.5*  --   --   ALBUMIN 3.5  --  2.9*  --   --   AST 47*  --  16  --   --   ALT 41  --  20  --   --   ALKPHOS 65  --  51  --   --   BILITOT 0.8  --  0.9  --   --   GFRNONAA 29*   < > 33* 26* 28*  GFRAA 33*   < > 38* 30* 32*  ANIONGAP 10   < > 7 7 1*   < > = values in this interval not displayed.     Hematology Recent Labs  Lab 10/06/19 0434 10/07/19 0627 10/08/19 0718  WBC 5.5 5.7 6.1  RBC 2.99* 3.14* 2.95*  HGB 8.9* 9.3* 8.6*  HCT 29.2* 30.2* 29.0*  MCV 97.7 96.2 98.3  MCH 29.8 29.6 29.2  MCHC 30.5 30.8 29.7*   RDW 15.9* 15.8* 15.5  PLT 244 250 229    BNP Recent Labs  Lab 10/02/19 1032 10/04/19 0935 10/07/19 0627  BNP 233.6* 259.0* 154.1*     DDimer No results for input(s): DDIMER in the last 168 hours.   Radiology    US RENAL  Result Date: 10/07/2019 CLINICAL DATA:  Acute kidney injury EXAM: RENAL / URINARY TRACT ULTRASOUND COMPLETE COMPARISON:  None. FINDINGS: Right Kidney: Renal measurements: 9.2 x 5.0 x 4.2 cm = volume: 101.0 mL. Echogenicity is increased. Renal cortical thickness is low normal. No perinephric fluid or hydronephrosis visualized. There is a cyst in the upper to mid right kidney measuring 1.1 x 1.2 x 1.4 cm. No sonographically demonstrable calculus or ureterectasis. Left Kidney: Renal measurements: 9.5 x 4.3 x 4.1 cm = volume: 86.1 mL. Echogenicity is increased. The renal cortical thickness is low. No perinephric fluid or hydronephrosis visualized. There is a cyst in the upper pole left kidney measuring 1.0 x 0.8 x 0.8 cm. There is a cyst in the lower pole left kidney measuring 1.0 x 0.7 x 0.9 cm. No sonographically demonstrable calculus or ureterectasis. Bladder: The urinary bladder wall thickness is normal. There is suspected mild debris in the dependent portion of the bladder. Flow from each distal ureter seen within the bladder. Other: None. IMPRESSION: 1. Increased renal echogenicity bilaterally, a finding indicative of medical renal disease. No obstructing focus in either kidney. 2.  Small renal cysts bilaterally. 3. Question debris within the bladder. Correlation with urinalysis in this regard advised. Electronically Signed   By: Lowella Grip III M.D.   On: 10/07/2019 09:14    Cardiac Studies   Echocardiogram  September 18, 2019  Echocardiogram reviewed with her from September 18, 2019, ejection fraction 45 to 50%, RV function mildly reduced, mildly elevated right heart pressures, severely dilated left atrium, mildly dilated IVC   Patient Profile     86 y.o. female  with history of CAD prior stents to LAD and RCA, sick sinus syndrome status post pacemaker, COPD, persistent atrial fibrillation, being seen due to volume overload and A. fib with RVR.  Assessment & Plan    1.  Persistent atrial fibrillation Tolerating metoprolol 25 twice daily, unable to advance dosing given hypotension  Not on amiodarone, has not been on anticoagulation Not a great candidate for digoxin given her age -Not on anticoagulation given anemia, falls, prior bleeding  2.   Acute on chronic diastolic and systolic CHF not on ACE or ARB given low pressure -Very high fluid intake at home, coffee, soda, water -Stressed importance of moderating her fluid intake --Diuretics on hold yesterday and today given climbing creatinine We will need to monitor high potassium.  Will order BMP this p.m.  3. SSS Pacemaker -outpatient follow-up with EP/Dr. Caryl Comes.  Acute on chronic renal failure Climbing BUN/creatinine likely prerenal state, Continue to hold Lasix Repeat potassium this afternoon   Total encounter time more than 25 minutes  Greater than 50% was spent in counseling and coordination of care with the patient   For questions or updates, please contact [place] Please consult [email or web address] for contact info under        Signed, Ida Rogue, MD  10/08/2019, 2:11 PM

## 2019-10-08 NOTE — Progress Notes (Signed)
Mobility Specialist - Progress Note   10/08/19 1259  Mobility  Activity Refused mobility (Mobility postponed)  Mobility performed by Mobility specialist    Per chart review, pt's potassium levels are too elevated (currently 6.0) to attempt session. Will attempt at a more appropriate time.    Kathee Delton Mobility Specialist 10/08/19, 1:01 PM

## 2019-10-08 NOTE — Progress Notes (Signed)
PROGRESS NOTE    Evelyn Simmons   NWG:[id number]  DOB: [birth year]  PCP: [facility] [facility]    DOA: 10/04/2019 LOS: 4   Brief Narrative   Evelyn Simmons  is a 86 y.o. Caucasian female with a known history of COPD and chronic respiratory failure on home O2 at 3 L/min, type diabetes mellitus, systolic and diastolic CHF, atrial fibrillation, status post pacemaker, dyslipidemia, hypertension and hypothyroidism, who presented to the emergency room with acute onset of worsening dyspnea over the last month and significantly worse over the last few days.  She came to the emergency room a couple of days ago and left before being seen.  She has been having chest congestion but denies any cough.  She was concerned about having fluid overload.  No fever chills.  No nausea vomiting or abdominal pain or diarrhea.  She admits to 3 pillow orthopnea and paroxysmal nocturnal dyspnea.  She denies any significant lower extremity edema.  She admits to dry cough without wheezing or hemoptysis.  No chest pain however she has been having palpitations.  No dysuria, oliguria or hematuria or flank pain.  She has been having dark stools and has noted bright red bleeding per rectum and mentions a history of diverticulosis and diverticulitis.  She had negative guaiac no.   Upon presentation to the ER, heart rate was 112 with respiratory rate of 24 and pulse oximetry 92% on room air with a blood pressure 180/63 and temperature 98.3.  Labs revealed a BUN of 25 and creatinine 1.35 compared to 25 and 1.592 days ago, BNP of 259 up from 233 then, high-sensitivity troponin I of 13 and later 24 and procalcitonin less than 0.1.  CBC showed anemia with hemoglobin of 9.4 hematocrit 31.1 close to her level 2 days ago however compared to 11 and 34.2 on 09/17/2019.  COVID-19 PCR is currently pending.  Showed her pacemaker attached to the right atrium and right ventricle, persistent cardiomegaly with pulmonary vascular congestion and trace  bilateral pleural effusions with suspected underlying CHF as well as an airspace opacity in the left lower lobe concerning for superimposed developing pneumonia.  It showed aortic atherosclerosis. EKG showed atrial fibrillation with a ventricular sponsor 126 and later 120 with nonspecific intraventricular conduction delay with left axis deviation, LVH and taste beats and triplets on her second EKG.   The patient's most recent 2D echo revealed an EF of 45 to 50% with mildly diminished systolic function, mild concentric LVH and inability to detect diastolic function, severe left atrial dilatation and mild right atrial dilatation.   The patient had dark stool but stool Hemoccult came back negative as above.   The patient was given 60 mg of IV Lasix.  She will be admitted to a progressive unit bed for further evaluation and management.     Assessment & Plan   Active Problems:   Acute CHF (congestive heart failure) (HCC)   Persistent atrial fibrillation (HCC)   Acute on chronic systolic and likely diastolic CHF - POA.  Presented with progressive dyspnea, orthopnea, PND, imaging showed pulmonary vascular congestion, all consistent with decompensated CHF.  Recent Echo on 8/7 showed EF 45-50%, indeterminate diastolic parameters, mild MR.   --Cardiology following -Holding diuresis given bump in creatinine.   --strict I/O's and daily weights --continue metoprolol --no ACEI/ARB currently due to low BP's and now AKI --Pt wants outpatient follow up with [facility] cardiology, Dr. Rockey Situ   Hyperkalemia - not present on admission.  K 6.0 this AM.  Given Lokelma.  Repeat K level this afternoon.  Persistent A-fib with intermittent RVR - CHA2DS2-VASc score is 6.  Continue amiodaroneand will utilize IV drip as needed.  Monitor on telemetry.  Continue Toprol 25 mg PO daily.  Previously not on anticoagulation due to anemia.    GI bleeding - reported melena and some BRBPR on admission, with known history  diverticulosis, this islikely lower GI source.  Hbg is being trended and stable around 9 (down from 11 earlier this month).  This AM Hbg 8.6. No BM since admission, started on bowel regimen. --continue trend H/H --transfuse if Hbg < 7.0 --GI was consulted - cancelled for now given negative FOBT and no BM's yet this admission.  --started stool softeners --will monitor closely and get GI on board if recurs.   -Hold aspirin and all anticoagulants for now --Continue PO Protonix  AKI superimposed on CKD stage IIIb - AKI present on admission with Cr 1.59.  Baseline Cr appears to be 1.2-1.3 recently.     Presented with Cr 1.59 >> 1.31 >> 1.22 >> 1.41 >> 1.77 >> 1.63 today.   AKI is secondary to diuresis.  Renal US negative for obstruction or other cause.   Holding diuresis.  Daily BMP to monitor.   Avoid nephrotoxins and hypotension.  Renally dose meds as indicated.    Neck Pain due to muscle spasms - due to poor resting posture in the bed.  Please use extra pillows to keep patient's head from tipping to the right when resting.  Heat packs.  Tylenol.  Has PRN Flexeril, please use.  Sick sinus syndrome - s/p pacemaker.  Follow up with EP, Dr. Caryl Comes.  Essential hypertension - chronic, stable. Continue Toprol.   Depression and anxiety - continue Cymbalta and Seroquel as well as Xanax.  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  Continue sliding scale NovoLog and Neurontin.  Continue basal insulin.    Dyslipidemia - continue statin   GERD - continue home Protonix  Obesity: Body mass index is 34.74 kg/m.  Complicates overall care and prognosis.   DVT prophylaxis: Place and maintain sequential compression device Start: 10/04/19 2327   Primary Care through [place] program.  Dionicia Abler [phone].     Diet:  Diet Orders (From admission, onward)    Start     Ordered   10/04/19 1858  Diet Heart Room service appropriate? Yes; Fluid  consistency: Thin  Diet effective now       Question Answer Comment  Room service appropriate? Yes   Fluid consistency: Thin      10/04/19 1906            Code Status: DNR    Subjective 10/08/19    Patient seen at bedside today.  Report her neck is very sore and has headache, back of head.  Says when resting, her head is always over to the right.  Does have muscle spasms on exam.  She otherwise reports feeling well.  Denies F/C, CP, SOB, N/V/D or other complaints.  Says still no BM since admission.  Disposition Plan & Communication   Status is: Inpatient  Remains inpatient appropriate because:IV treatments appropriate due to intensity of illness or inability to take PO. Hypoxic with ambulation.  Has AKI requiring monitoring.  Hyperkalemia.   Dispo: The patient is from: Home              Anticipated d/c is to: SNF  Anticipated d/c date is: 2 days              Patient currently is not medically stable to d/c.   Family Communication: none at bedside, will attempt to call    Consults, Procedures, Significant Events   Consultants:   Cardiology  Gastroenterology  Procedures:   None    Objective   Vitals:   10/07/19 1530 10/07/19 2024 10/08/19 0430 10/08/19 0829  BP: 105/80 (!) 122/57 102/64 117/66  Pulse: (!) 117 99 98 79  Resp: 18 20 20 19   Temp: 98.1 F (36.7 C) 98.4 F (36.9 C) 98.6 F (37 C) 98.3 F (36.8 C)  TempSrc: Oral Oral Oral Oral  SpO2: 99% 97% 96% 95%  Weight:   78 kg   Height:        Intake/Output Summary (Last 24 hours) at 10/08/2019 0856 Last data filed at 10/08/2019 0432 Gross per 24 hour  Intake --  Output 750 ml  Net -750 ml   Filed Weights   10/06/19 0545 10/07/19 0522 10/08/19 0430  Weight: 78.2 kg 77.1 kg 78 kg    Physical Exam:  General exam: awake, alert, no acute distress, obese Neck: hypertonic upper trapezius bilaterally, tender on palpation, reduced ROM in neck Respiratory system: CTAB diminished bases,  normal respiratory effort. Cardiovascular system: normal S1/S2, RRR, no pedal edema.   Central nervous system: A&O x3. no gross focal neurologic deficits, normal speech Extremities: moves all, no cyanosis, normal tone Psychiatry: normal mood, congruent affect, judgement and insight appear normal  Labs   Data Reviewed: I have personally reviewed following labs and imaging studies  CBC: Recent Labs  Lab 10/02/19 1032 10/02/19 1032 10/04/19 0935 10/04/19 0935 10/05/19 0605 10/05/19 1832 10/06/19 0434 10/07/19 0627 10/08/19 0718  WBC 5.7  --  5.3  --   --   --  5.5 5.7 6.1  NEUTROABS  --   --   --   --   --   --  3.6  --   --   HGB 9.7*   < > 9.4*   < > 9.6* 9.8* 8.9* 9.3* 8.6*  HCT 31.7*   < > 31.1*   < > 30.4* 32.6* 29.2* 30.2* 29.0*  MCV 96.4  --  96.6  --   --   --  97.7 96.2 98.3  PLT 285  --  265  --   --   --  244 250 229   < > = values in this interval not displayed.   Basic Metabolic Panel: Recent Labs  Lab 10/02/19 1032 10/04/19 0935 10/05/19 0605 10/06/19 0434 10/07/19 0627  NA 140 146* 143 143 139  K 4.3 4.1 3.5 4.3 5.2*  CL 102 107 102 103 100  CO2 28 29 29  33* 32  GLUCOSE 123* 138* 105* 113* 141*  BUN 25* 25* 21 25* 35*  CREATININE 1.59* 1.31* 1.22* 1.41* 1.74*  CALCIUM 10.1 10.4* 10.3 9.7 10.0  MG  --  2.0  --  1.8 2.0  PHOS  --   --   --  3.0  --    GFR: Estimated Creatinine Clearance: 20.1 mL/min (A) (by C-G formula based on SCr of 1.74 mg/dL (H)). Liver Function Tests: Recent Labs  Lab 10/02/19 1032 10/06/19 0434  AST 47* 16  ALT 41 20  ALKPHOS 65 51  BILITOT 0.8 0.9  PROT 6.4* 5.5*  ALBUMIN 3.5 2.9*   No results for input(s): LIPASE, AMYLASE in the last 168 hours.  No results for input(s): AMMONIA in the last 168 hours. Coagulation Profile: No results for input(s): INR, PROTIME in the last 168 hours. Cardiac Enzymes: No results for input(s): CKTOTAL, CKMB, CKMBINDEX, TROPONINI in the last 168 hours. BNP (last 3 results) No results for  input(s): PROBNP in the last 8760 hours. HbA1C: No results for input(s): HGBA1C in the last 72 hours. CBG: Recent Labs  Lab 10/06/19 0612  GLUCAP 125*   Lipid Profile: No results for input(s): CHOL, HDL, LDLCALC, TRIG, CHOLHDL, LDLDIRECT in the last 72 hours. Thyroid Function Tests: No results for input(s): TSH, T4TOTAL, FREET4, T3FREE, THYROIDAB in the last 72 hours. Anemia Panel: No results for input(s): VITAMINB12, FOLATE, FERRITIN, TIBC, IRON, RETICCTPCT in the last 72 hours. Sepsis Labs: Recent Labs  Lab 10/04/19 0935 2019-10-23 0627  PROCALCITON <0.10 <0.10    Recent Results (from the past 240 hour(s))  SARS Coronavirus 2 by RT PCR (hospital order, performed in [facility] hospital lab) Nasopharyngeal Nasopharyngeal Swab     Status: None   Collection Time: 10/04/19  6:56 PM   Specimen: Nasopharyngeal Swab  Result Value Ref Range Status   SARS Coronavirus 2 NEGATIVE NEGATIVE Final    Comment: (NOTE) SARS-CoV-2 target nucleic acids are NOT DETECTED.  The SARS-CoV-2 RNA is generally detectable in upper and lower respiratory specimens during the acute phase of infection. The lowest concentration of SARS-CoV-2 viral copies this assay can detect is 250 copies / mL. A negative result does not preclude SARS-CoV-2 infection and should not be used as the sole basis for treatment or other patient management decisions.  A negative result may occur with improper specimen collection / handling, submission of specimen other than nasopharyngeal swab, presence of viral mutation(s) within the areas targeted by this assay, and inadequate number of viral copies (<250 copies / mL). A negative result must be combined with clinical observations, patient history, and epidemiological information.  Fact Sheet for Patients:   StrictlyIdeas.no  Fact Sheet for Healthcare Providers: BankingDealers.co.za  This test is not yet approved or  cleared  by the [place] FDA and has been authorized for detection and/or diagnosis of SARS-CoV-2 by FDA under an Emergency Use Authorization (EUA).  This EUA will remain in effect (meaning this test can be used) for the duration of the COVID-19 declaration under Section 564(b)(1) of the Act, 21 U.S.C. section 360bbb-3(b)(1), unless the authorization is terminated or revoked sooner.  Performed at [facility], [address] [place],  [address]       Imaging Studies   US RENAL  Result Date: October 23, 2019 CLINICAL DATA:  Acute kidney injury EXAM: RENAL / URINARY TRACT ULTRASOUND COMPLETE COMPARISON:  None. FINDINGS: Right Kidney: Renal measurements: 9.2 x 5.0 x 4.2 cm = volume: 101.0 mL. Echogenicity is increased. Renal cortical thickness is low normal. No perinephric fluid or hydronephrosis visualized. There is a cyst in the upper to mid right kidney measuring 1.1 x 1.2 x 1.4 cm. No sonographically demonstrable calculus or ureterectasis. Left Kidney: Renal measurements: 9.5 x 4.3 x 4.1 cm = volume: 86.1 mL. Echogenicity is increased. The renal cortical thickness is low. No perinephric fluid or hydronephrosis visualized. There is a cyst in the upper pole left kidney measuring 1.0 x 0.8 x 0.8 cm. There is a cyst in the lower pole left kidney measuring 1.0 x 0.7 x 0.9 cm. No sonographically demonstrable calculus or ureterectasis. Bladder: The urinary bladder wall thickness is normal. There is suspected mild debris in the dependent portion of  the bladder. Flow from each distal ureter seen within the bladder. Other: None. IMPRESSION: 1. Increased renal echogenicity bilaterally, a finding indicative of medical renal disease. No obstructing focus in either kidney. 2.  Small renal cysts bilaterally. 3. Question debris within the bladder. Correlation with urinalysis in this regard advised. Electronically Signed   By: Lowella Grip III M.D.   On: 10/07/2019 09:14     Medications    Scheduled Meds: . cholecalciferol  1,000 Units Oral Daily  . DULoxetine  30 mg Oral Daily  . metoprolol tartrate  25 mg Oral BID  . pantoprazole  20 mg Oral Daily  . polyethylene glycol  17 g Oral Daily  . rosuvastatin  40 mg Oral Daily  . sodium chloride flush  3 mL Intravenous Q12H  . traZODone  100 mg Oral QHS   Continuous Infusions: . sodium chloride         LOS: 4 days    Time spent: 30 minutes    Ezekiel Slocumb, DO [facility]  10/08/2019, 8:56 AM    If 7PM-7AM, please contact night-coverage. How to contact the [facility] Attending or Consulting provider [place] or covering provider during after hours [place], for this patient?    1. Check the care team in [facility] and look for a) attending/consulting [facility] provider listed and b) the [facility] team listed 2. Log into [email or web address] and use [place] universal password to access. If you do not have the password, please contact the hospital operator. 3. Locate the [facility] provider you are looking for under [facility] and page to a number that you can be directly reached. 4. If you still have difficulty reaching the provider, please page the [facility] (Director on Call) for the Hospitalists listed on amion for assistance.

## 2019-10-09 LAB — BASIC METABOLIC PANEL
Anion gap: 6 (ref 5–15)
BUN: 35 mg/dL — ABNORMAL HIGH (ref 8–23)
CO2: 31 mmol/L (ref 22–32)
Calcium: 10.4 mg/dL — ABNORMAL HIGH (ref 8.9–10.3)
Chloride: 101 mmol/L (ref 98–111)
Creatinine, Ser: 1.36 mg/dL — ABNORMAL HIGH (ref 0.44–1.00)
GFR calc Af Amer: 40 mL/min — ABNORMAL LOW (ref >60)
GFR calc non Af Amer: 35 mL/min — ABNORMAL LOW (ref >60)
Glucose, Bld: 142 mg/dL — ABNORMAL HIGH (ref 70–99)
Potassium: 5.5 mmol/L — ABNORMAL HIGH (ref 3.5–5.1)
Sodium: 138 mmol/L (ref 135–145)

## 2019-10-09 LAB — CBC
HCT: 28.1 % — ABNORMAL LOW (ref 36.0–46.0)
Hemoglobin: 8.4 g/dL — ABNORMAL LOW (ref 12.0–15.0)
MCH: 29.4 pg (ref 26.0–34.0)
MCHC: 29.9 g/dL — ABNORMAL LOW (ref 30.0–36.0)
MCV: 98.3 fL (ref 80.0–100.0)
Platelets: 226 10*3/uL (ref 150–400)
RBC: 2.86 MIL/uL — ABNORMAL LOW (ref 3.87–5.11)
RDW: 15.5 % (ref 11.5–15.5)
WBC: 5.4 10*3/uL (ref 4.0–10.5)
nRBC: 0 % (ref 0.0–0.2)

## 2019-10-09 LAB — MAGNESIUM: Magnesium: 2 mg/dL (ref 1.7–2.4)

## 2019-10-09 MED ORDER — MUSCLE RUB 10-15 % EX CREA
TOPICAL_CREAM | CUTANEOUS | Status: DC | PRN
  Filled 2019-10-09: qty 85

## 2019-10-09 MED ORDER — TORSEMIDE 20 MG PO TABS
20.0000 mg | ORAL_TABLET | Freq: Two times a day (BID) | ORAL | Status: DC
  Administered 2019-10-09 – 2019-10-11 (×5): 20 mg via ORAL
  Filled 2019-10-09 (×3): qty 1

## 2019-10-09 NOTE — Evaluation (Signed)
Occupational Therapy Evaluation Patient Details Name: Evelyn Simmons MRN: [id number] DOB: Feb 03, 1930 Today's Date: 10/09/2019    History of Present Illness Evelyn Simmons is an 78yoF who comes to [facility] on 8/23 c SOB. Pt admitted in combined CHF exacerbation, also noted to have AF c RVR. PMH: COPD, CRF on 2L/min O2 PRN at home, combined CHF, AF, PPM, HTN, hypoTSH. Pt is a [employer] participant, mostly a household ambulator with [id number].   Clinical Impression   Pt seen for OT evaluation this date.  Patient reports she lives alone but her son is coming to stay with her when she discharges from the hospital until she is able to care for herself again.  She presents with muscle weakness, decreased transfers/functional mobility and decreased ability to perform self care and IADL tasks.  She would benefit from skilled OT services to maximize her safety and independence in daily tasks to return home safely.  She will likely require 24 hour supervision and assist at discharge.      Follow Up Recommendations  Home health OT (pt reports her son is coming to stay with her 24 hours a day until she is better) otherwise she will likely require STR   Equipment Recommendations       Recommendations for Other Services       Precautions / Restrictions Precautions Precautions: Fall Restrictions Weight Bearing Restrictions: No      Mobility Bed Mobility                  Transfers Overall transfer level: Needs assistance Equipment used: Rolling walker (2 wheeled) Transfers: Sit to/from Stand Sit to Stand: Min guard              Balance Overall balance assessment: Modified Independent                                         ADL either performed or assessed with clinical judgement   ADL Overall ADL's : Needs assistance/impaired Eating/Feeding: Modified independent   Grooming: Min guard;Standing Grooming Details (indicate cue type and reason): performed at the sink Upper Body  Bathing: Set up   Lower Body Bathing: Minimal assistance   Upper Body Dressing : Modified independent   Lower Body Dressing: Set up;Minimal assistance   Toilet Transfer: Min guard           Functional mobility during ADLs: Min guard;Rolling walker General ADL Comments: Patient reports her daughter shops for her groceries, she receives meals on wheels.  She is a [employer] participant but has not been able to attend twice a week since the pandemic, she now goes one time a month.  She has a personal care aide who assists 2 days a week for 2 hours.     Vision Baseline Vision/History: Glaucoma Additional Comments: Patient reports vision in her right eye only, no vision in the left due to glaucoma.     Perception     Praxis      Pertinent Vitals/Pain Pain Assessment: 0-10 Pain Score: 7  Pain Location: neck Pain Descriptors / Indicators: Aching Pain Intervention(s): Limited activity within patient's tolerance;Monitored during session;Repositioned     Hand Dominance Right   Extremity/Trunk Assessment Upper Extremity Assessment Upper Extremity Assessment: Generalized weakness   Lower Extremity Assessment Lower Extremity Assessment: Defer to PT evaluation       Communication Communication Communication: No difficulties   Cognition Arousal/Alertness: Awake/alert  Behavior During Therapy: WFL for tasks assessed/performed Overall Cognitive Status: Within Functional Limits for tasks assessed                                     General Comments       Exercises     Shoulder Instructions      Home Living Family/patient expects to be discharged to:: Private residence Living Arrangements: Alone Available Help at Discharge: Personal care attendant Type of Home: Apartment Home Access: Level entry     Home Layout: One level     Bathroom Shower/Tub: Walk-in Hydrologist: Standard     Home Equipment: Wheelchair - manual;Cane - single  point;Walker - 4 wheels;Shower seat   Additional Comments: Pt is a PACE participant      Prior Functioning/Environment Level of Independence: Needs assistance  Gait / Transfers Assistance Needed: household AMB c rollator ADL's / Homemaking Assistance Needed: Patient was modified independent with bathing and dressing skills with increased time allowed to complete tasks.            OT Problem List: Decreased strength;Decreased knowledge of use of DME or AE;Decreased activity tolerance;Pain;Cardiopulmonary status limiting activity      OT Treatment/Interventions: Self-care/ADL training;Therapeutic exercise;Patient/family education;Therapeutic activities;DME and/or AE instruction    OT Goals(Current goals can be found in the care plan section) Acute Rehab OT Goals Patient Stated Goal: to go home, take care of myself OT Goal Formulation: With patient Time For Goal Achievement: 10/23/19 Potential to Achieve Goals: Good ADL Goals Pt Will Perform Lower Body Dressing: (P) with modified independence Pt Will Transfer to Toilet: (P) with modified independence  OT Frequency: Min 1X/week   Barriers to D/C:            Co-evaluation              AM-PAC OT "6 Clicks" Daily Activity     Outcome Measure Help from another person eating meals?: None Help from another person taking care of personal grooming?: A Little Help from another person toileting, which includes using toliet, bedpan, or urinal?: A Little Help from another person bathing (including washing, rinsing, drying)?: A Little Help from another person to put on and taking off regular upper body clothing?: None Help from another person to put on and taking off regular lower body clothing?: A Little 6 Click Score: 20   End of Session Equipment Utilized During Treatment: Gait belt;Rolling walker  Activity Tolerance: Patient tolerated treatment well Patient left: in chair;with call bell/phone within reach  OT Visit  Diagnosis: Muscle weakness (generalized) (M62.81);Unsteadiness on feet (R26.81)                Time: 1340-1405 OT Time Calculation (min): 25 min Charges:  OT General Charges $OT Visit: 1 Visit OT Evaluation $OT Eval Low Complexity: 1 Low OT Treatments $Self Care/Home Management : 8-22 mins  Reather Steller T Mahrukh Seguin, OTR/L, CLT   Kc Sedlak 10/09/2019, 4:16 PM

## 2019-10-09 NOTE — Progress Notes (Addendum)
Progress Note  Patient Name: Evelyn Simmons Date of Encounter: 10/09/2019  [facility] Cardiologist: Virl Axe, MD   Subjective   Breathing stable, Insisting on going home Legs feel weak, most of her hospital stay has been spent in the bed Reports having spasms in her lower back, now in her neck Son is coming to stay with her on Monday for 2 days Denies shortness of breath, no leg swelling, no abdominal distention  Inpatient Medications    Scheduled Meds: . cholecalciferol  1,000 Units Oral Daily  . DULoxetine  30 mg Oral Daily  . metoprolol tartrate  25 mg Oral BID  . pantoprazole  20 mg Oral Daily  . polyethylene glycol  17 g Oral Daily  . rosuvastatin  40 mg Oral Daily  . senna  1 tablet Oral QHS  . sodium chloride flush  3 mL Intravenous Q12H  . sodium zirconium cyclosilicate  10 g Oral TID  . torsemide  20 mg Oral BID  . traZODone  100 mg Oral QHS   Continuous Infusions: . sodium chloride     PRN Meds: sodium chloride, acetaminophen, albuterol, ALPRAZolam, bisacodyl, cyclobenzaprine, HYDROcodone-acetaminophen, ipratropium-albuterol, ondansetron (ZOFRAN) IV, senna-docusate, sodium chloride flush, zolpidem   Vital Signs    Vitals:   10/09/19 0336 10/09/19 0340 10/09/19 0752 10/09/19 1127  BP:  122/79 115/67 118/81  Pulse:  (!) 103 74 85  Resp:  18 16 16   Temp:  98 F (36.7 C) 97.9 F (36.6 C) 97.9 F (36.6 C)  TempSrc:  Oral Oral   SpO2:  97% 96% 98%  Weight: 82.2 kg     Height:        Intake/Output Summary (Last 24 hours) at 10/09/2019 1349 Last data filed at 10/09/2019 1127 Gross per 24 hour  Intake 480 ml  Output 1625 ml  Net -1145 ml   Last 3 Weights 10/09/2019 10/08/2019 10/07/2019  Weight (lbs) 181 lb 3.2 oz 172 lb 170 lb  Weight (kg) 82.192 kg 78.019 kg 77.111 kg      Telemetry    Paced rhythm, underlying atrial fibrillation personally Reviewed  ECG     - Personally Reviewed  Physical Exam   Constitutional:  oriented to person,  place, and time. No distress.  HENT:  Head: Grossly normal Eyes:  no discharge. No scleral icterus.  Neck: No JVD, no carotid bruits  Cardiovascular: Irregularly irregular no murmurs appreciated Pulmonary/Chest: Clear to auscultation bilaterally, no wheezes or rails Abdominal: Soft.  no distension.  no tenderness.  Musculoskeletal: Normal range of motion Neurological:  normal muscle tone. Coordination normal. No atrophy Skin: Skin warm and dry Psychiatric: normal affect, pleasant   Labs    High Sensitivity Troponin:   Recent Labs  Lab 09/17/19 1055 09/17/19 1539 10/02/19 1032 10/04/19 0935 10/04/19 1600  TROPONINIHS 19* 21* 13 13 24*      Chemistry Recent Labs  Lab 10/06/19 0434 10/06/19 0434 10/07/19 0627 10/07/19 0627 10/08/19 0718 10/08/19 1612 10/09/19 0649  NA 143   < > 139  --  138  --  138  K 4.3   < > 5.2*   < > 6.0* 5.8* 5.5*  CL 103   < > 100  --  102  --  101  CO2 33*   < > 32  --  35*  --  31  GLUCOSE 113*   < > 141*  --  136*  --  142*  BUN 25*   < > 35*  --  35*  --  35*  CREATININE 1.41*   < > 1.74*  --  1.63*  --  1.36*  CALCIUM 9.7   < > 10.0  --  10.6*  --  10.4*  PROT 5.5*  --   --   --   --   --   --   ALBUMIN 2.9*  --   --   --   --   --   --   AST 16  --   --   --   --   --   --   ALT 20  --   --   --   --   --   --   ALKPHOS 51  --   --   --   --   --   --   BILITOT 0.9  --   --   --   --   --   --   GFRNONAA 33*   < > 26*  --  28*  --  35*  GFRAA 38*   < > 30*  --  32*  --  40*  ANIONGAP 7   < > 7  --  1*  --  6   < > = values in this interval not displayed.     Hematology Recent Labs  Lab 10/07/19 0627 10/08/19 0718 10/09/19 0649  WBC 5.7 6.1 5.4  RBC 3.14* 2.95* 2.86*  HGB 9.3* 8.6* 8.4*  HCT 30.2* 29.0* 28.1*  MCV 96.2 98.3 98.3  MCH 29.6 29.2 29.4  MCHC 30.8 29.7* 29.9*  RDW 15.8* 15.5 15.5  PLT 250 229 226    BNP Recent Labs  Lab 10/04/19 0935 10/07/19 0627  BNP 259.0* 154.1*     DDimer No results for  input(s): DDIMER in the last 168 hours.   Radiology    No results found.  Cardiac Studies   Echocardiogram  September 18, 2019  Echocardiogram reviewed with her from September 18, 2019, ejection fraction 45 to 50%, RV function mildly reduced, mildly elevated right heart pressures, severely dilated left atrium, mildly dilated IVC   Patient Profile     86 y.o. female with history of CAD prior stents to LAD and RCA, sick sinus syndrome status post pacemaker, COPD, persistent atrial fibrillation, being seen due to volume overload and A. fib with RVR.  Assessment & Plan    1.  Persistent atrial fibrillation Would continue metoprolol 25 twice daily,  Unable to advance dose given hypotension No indication for amiodarone, she is not on anticoagulation Not a great candidate for digoxin given her age Not on anticoagulation given prior GI bleeding, fall risk  2.   Acute on chronic diastolic and systolic CHF not on ACE or ARB given low pressure Again discussed moderating her fluid at home Previously reported high coffee soda water intake likely leading to readmission Stressed importance of compliance of her diuretic Renal function back to baseline, will restart diuretic -She reports Lasix does not seem to be working well for her, will change to torsemide 20 twice daily She may need extra torsemide 20 for weight gain Close follow-up in clinic, she would like to see Dr. Caryl Comes for follow-up of her pacer.  We will help arrange  3. SSS Pacemaker -outpatient follow-up with EP/Dr. Caryl Comes.  Acute on chronic renal failure Renal function back to baseline Potassium reasonable range Will start torsemide 20 twice daily  [facility] will sign off.   Medication Recommendations: Torsemide 20 twice  daily Extra torsemide as needed for weight gain, worsening shortness of breath Other recommendations (labs, testing, etc): No further work-up Follow up as an outpatient: Follow-up with Dr. Belva Chimes, [facility] (we  will arrange)  Discussed with her in detail, The cardiac perspective, could be discharged today Legs are weak, would continue working with physical therapy, she is declining skilled nursing  Total encounter time more than 25 minutes  Greater than 50% was spent in counseling and coordination of care with the patient   For questions or updates, please contact [place] Please consult [email or web address] for contact info under        Signed, Ida Rogue, MD  10/09/2019, 1:49 PM

## 2019-10-09 NOTE — Plan of Care (Signed)
  Problem: Health Behavior/Discharge Planning: Goal: Ability to manage health-related needs will improve Outcome: Progressing   Problem: Clinical Measurements: Goal: Respiratory complications will improve Outcome: Progressing   Problem: Activity: Goal: Risk for activity intolerance will decrease Outcome: Progressing   Problem: Activity: Goal: Capacity to carry out activities will improve Outcome: Progressing

## 2019-10-09 NOTE — Progress Notes (Signed)
PROGRESS NOTE    Evelyn Simmons   RSW:[id number]  DOB: September 11, 1929  PCP: [place]    DOA: 10/04/2019 LOS: 5   Brief Narrative   Evelyn Simmons  is a 86 y.o. Caucasian female with a known history of COPD and chronic respiratory failure on home O2 at 3 L/min, type diabetes mellitus, systolic and diastolic CHF, atrial fibrillation, status post pacemaker, dyslipidemia, hypertension and hypothyroidism, who presented to the emergency room with acute onset of worsening dyspnea over the last month and significantly worse over the last few days.  She came to the emergency room a couple of days ago and left before being seen.  She has been having chest congestion but denies any cough.  She was concerned about having fluid overload.  No fever chills.  No nausea vomiting or abdominal pain or diarrhea.  She admits to 3 pillow orthopnea and paroxysmal nocturnal dyspnea.  She denies any significant lower extremity edema.  She admits to dry cough without wheezing or hemoptysis.  No chest pain however she has been having palpitations.  No dysuria, oliguria or hematuria or flank pain.  She has been having dark stools and has noted bright red bleeding per rectum and mentions a history of diverticulosis and diverticulitis.  She had negative guaiac no.   Upon presentation to the ER, heart rate was 112 with respiratory rate of 24 and pulse oximetry 92% on room air with a blood pressure 180/63 and temperature 98.3.  Labs revealed a BUN of 25 and creatinine 1.35 compared to 25 and 1.592 days ago, BNP of 259 up from 233 then, high-sensitivity troponin I of 13 and later 24 and procalcitonin less than 0.1.  CBC showed anemia with hemoglobin of 9.4 hematocrit 31.1 close to her level 2 days ago however compared to 11 and 34.2 on 09/17/2019.  COVID-19 PCR is currently pending.  Showed her pacemaker attached to the right atrium and right ventricle, persistent cardiomegaly with pulmonary vascular congestion and trace  bilateral pleural effusions with suspected underlying CHF as well as an airspace opacity in the left lower lobe concerning for superimposed developing pneumonia.  It showed aortic atherosclerosis. EKG showed atrial fibrillation with a ventricular sponsor 126 and later 120 with nonspecific intraventricular conduction delay with left axis deviation, LVH and taste beats and triplets on her second EKG.   The patient's most recent 2D echo revealed an EF of 45 to 50% with mildly diminished systolic function, mild concentric LVH and inability to detect diastolic function, severe left atrial dilatation and mild right atrial dilatation.   The patient had dark stool but stool Hemoccult came back negative as above.   The patient was given 60 mg of IV Lasix.  She will be admitted to a progressive unit bed for further evaluation and management.     Assessment & Plan   Active Problems:   Acute CHF (congestive heart failure) (HCC)   Persistent atrial fibrillation (HCC)   Acute on chronic systolic and likely diastolic CHF - POA.  Presented with progressive dyspnea, orthopnea, PND, imaging showed pulmonary vascular congestion, all consistent with decompensated CHF.  Recent Echo on 8/7 showed EF 45-50%, indeterminate diastolic parameters, mild MR.   --Cardiology following -Started on torsemide 20 mg twice daily --May require extra torsemide for weight gain or progressive shortness of breath --strict I/O's and daily weights --continue metoprolol --no ACEI/ARB currently due to low BP's and now AKI --Low-sodium diet with fluid restriction --Pt wants outpatient follow up with [facility]  cardiology, Dr. Rockey Situ   Hyperkalemia - not present on admission.  K 6.0 this AM.  Given Lokelma.  Repeat K level this afternoon.  Persistent A-fib with intermittent RVR - CHA2DS2-VASc score is 6.  Continue amiodaroneand will utilize IV drip as needed.  Monitor on telemetry.  Continue Toprol 25 mg PO daily.  Previously not on  anticoagulation due to anemia.    GI bleeding - reported melena and some BRBPR on admission, with known history diverticulosis, this islikely lower GI source.  Hbg is being trended and stable around 9 (down from 11 earlier this month).  This AM Hbg slightly down at 8.4. Finally had BM on 8/27 which was normal brown color, had been dark black tarry prior to admission. --continue trend H/H --transfuse if Hbg < 7.0 --GI was consulted - cancelled for now given negative FOBT and no BM's yet this admission.  --started stool softeners --will monitor closely and get GI on board if recurs.   -Hold aspirin and all anticoagulants for now --Continue PO Protonix  AKI superimposed on CKD stage IIIb - AKI present on admission with Cr 1.59.  Baseline Cr appears to be 1.2-1.3 recently.     Presented with Cr 1.59 >> 1.31 >> 1.22 >> 1.41 >> 1.77 >> 1.63 >> 1.36 (baseline) today.   AKI secondary to diuresis.  Renal US negative for obstruction or other cause.   Daily BMP to monitor.   Avoid nephrotoxins and hypotension, maintain MAP greater than 65.  Renally dose meds as indicated.    Neck Pain due to muscle spasms - due to poor resting posture in the bed.  Please use extra pillows to keep patient's head from tipping to the right when resting.  Heat packs.  Tylenol.  Has PRN Flexeril, please use.  Topical menthol added.   Generalized weakness -PT recommends SNF, however patient is adamant about returning home.  She says her son will be coming to stay with her and can stay as long as as needed.  We will arrange home health services.  TOC is following. --Ambulate patient at least each shift --Out of bed to chair daily and is much as tolerated  Sick sinus syndrome - s/p pacemaker.  Follow up with EP, Dr. Caryl Comes.  Essential hypertension - chronic, stable. Continue Toprol.   Depression and anxiety - continue Cymbalta and Seroquel as well as Xanax.  Type II diabetes mellitus with peripheral neuropathy -  controlled, last A1C was 6.9%.  Continue sliding scale NovoLog and Neurontin.  Continue basal insulin.    Dyslipidemia - continue statin   GERD - continue home Protonix  Obesity: Body mass index is 36.6 kg/m.  Complicates overall care and prognosis.   DVT prophylaxis: Place and maintain sequential compression device Start: 10/04/19 2327   Primary Care through [facility] program.  Dionicia Abler [phone].     Diet:  Diet Orders (From admission, onward)    Start     Ordered   10/04/19 1858  Diet Heart Room service appropriate? Yes; Fluid consistency: Thin  Diet effective now       Question Answer Comment  Room service appropriate? Yes   Fluid consistency: Thin      10/04/19 1906            Code Status: DNR    Subjective 10/09/19    Patient seen at bedside today.  She says her neck pain is about the same, slightly better with heat packs.  Says that Tylenol and Flexeril given yesterday  did help her back but not her neck very much.  She otherwise denies complaints including chest pain or shortness of breath, fevers or chills, nausea vomiting or diarrhea.  I asked her about going to rehab and she insisted on going home and says her son is coming to stay with her and can stay as long as she needs his help.  Disposition Plan & Communication   Status is: Inpatient  Remains inpatient appropriate because:IV treatments appropriate due to intensity of illness or inability to take PO. Hyperkalemia.     Dispo: The patient is from: Home              Anticipated d/c is to: SNF              Anticipated d/c date is: 2 days              Patient currently is not medically stable to d/c.   Family Communication: none at bedside, will attempt to call   Spoke with PACE, Dionicia Abler on 8/27, regarding patient's status, plans and expected d/c to rehab.     Consults, Procedures, Significant Events   Consultants:   Cardiology  Gastroenterology  Procedures:   None    Objective     Vitals:   10/08/19 2328 10/09/19 0336 10/09/19 0340 10/09/19 0752  BP: 108/64  122/79 115/67  Pulse: 80  (!) 103 74  Resp:   18 16  Temp: 97.7 F (36.5 C)  98 F (36.7 C) 97.9 F (36.6 C)  TempSrc: Oral  Oral Oral  SpO2: 97%  97% 96%  Weight:  82.2 kg    Height:        Intake/Output Summary (Last 24 hours) at 10/09/2019 2641 Last data filed at 10/09/2019 0300 Gross per 24 hour  Intake 480 ml  Output 1225 ml  Net -745 ml   Filed Weights   10/07/19 0522 10/08/19 0430 10/09/19 0336  Weight: 77.1 kg 78 kg 82.2 kg    Physical Exam:  General exam: awake, alert, no acute distress, obese Neck: Heat pack across upper shoulders and neck, range of motion not tested, upper traps remain hypertonic Respiratory system: CTAB, unable to appreciate rales, rhonchi or wheezing.  Normal respiratory effort. Cardiovascular system: normal S1/S2, RRR, no pedal edema.   Central nervous system: A&O x3. no gross focal neurologic deficits, normal speech Extremities: moves all, no cyanosis, normal tone Psychiatry: normal mood, congruent affect  Labs   Data Reviewed: I have personally reviewed following labs and imaging studies  CBC: Recent Labs  Lab 10/04/19 0935 10/05/19 0605 10/05/19 1832 10/06/19 0434 10/07/19 0627 10/08/19 0718 10/09/19 0649  WBC 5.3  --   --  5.5 5.7 6.1 5.4  NEUTROABS  --   --   --  3.6  --   --   --   HGB 9.4*   < > 9.8* 8.9* 9.3* 8.6* 8.4*  HCT 31.1*   < > 32.6* 29.2* 30.2* 29.0* 28.1*  MCV 96.6  --   --  97.7 96.2 98.3 98.3  PLT 265  --   --  244 250 229 226   < > = values in this interval not displayed.   Basic Metabolic Panel: Recent Labs  Lab 10/04/19 0935 10/04/19 0935 10/05/19 0605 10/06/19 0434 10/07/19 0627 10/08/19 0718 10/08/19 1612  NA 146*  --  143 143 139 138  --   K 4.1   < > 3.5 4.3 5.2* 6.0* 5.8*  CL 107  --  102 103 100 102  --   CO2 29  --  29 33* 32 35*  --   GLUCOSE 138*  --  105* 113* 141* 136*  --   BUN 25*  --  21 25* 35*  35*  --   CREATININE 1.31*  --  1.22* 1.41* 1.74* 1.63*  --   CALCIUM 10.4*  --  10.3 9.7 10.0 10.6*  --   MG 2.0  --   --  1.8 2.0 1.9  --   PHOS  --   --   --  3.0  --   --   --    < > = values in this interval not displayed.   GFR: Estimated Creatinine Clearance: 22.1 mL/min (A) (by C-G formula based on SCr of 1.63 mg/dL (H)). Liver Function Tests: Recent Labs  Lab 10/02/19 1032 10/06/19 0434  AST 47* 16  ALT 41 20  ALKPHOS 65 51  BILITOT 0.8 0.9  PROT 6.4* 5.5*  ALBUMIN 3.5 2.9*   No results for input(s): LIPASE, AMYLASE in the last 168 hours. No results for input(s): AMMONIA in the last 168 hours. Coagulation Profile: No results for input(s): INR, PROTIME in the last 168 hours. Cardiac Enzymes: No results for input(s): CKTOTAL, CKMB, CKMBINDEX, TROPONINI in the last 168 hours. BNP (last 3 results) No results for input(s): PROBNP in the last 8760 hours. HbA1C: No results for input(s): HGBA1C in the last 72 hours. CBG: Recent Labs  Lab 10/06/19 0612  GLUCAP 125*   Lipid Profile: No results for input(s): CHOL, HDL, LDLCALC, TRIG, CHOLHDL, LDLDIRECT in the last 72 hours. Thyroid Function Tests: No results for input(s): TSH, T4TOTAL, FREET4, T3FREE, THYROIDAB in the last 72 hours. Anemia Panel: No results for input(s): VITAMINB12, FOLATE, FERRITIN, TIBC, IRON, RETICCTPCT in the last 72 hours. Sepsis Labs: Recent Labs  Lab 10/04/19 0935 10/07/19 0627  PROCALCITON <0.10 <0.10    Recent Results (from the past 240 hour(s))  SARS Coronavirus 2 by RT PCR (hospital order, performed in [facility] hospital lab) Nasopharyngeal Nasopharyngeal Swab     Status: None   Collection Time: 10/04/19  6:56 PM   Specimen: Nasopharyngeal Swab  Result Value Ref Range Status   SARS Coronavirus 2 NEGATIVE NEGATIVE Final    Comment: (NOTE) SARS-CoV-2 target nucleic acids are NOT DETECTED.  The SARS-CoV-2 RNA is generally detectable in upper and lower respiratory specimens during  the acute phase of infection. The lowest concentration of SARS-CoV-2 viral copies this assay can detect is 250 copies / mL. A negative result does not preclude SARS-CoV-2 infection and should not be used as the sole basis for treatment or other patient management decisions.  A negative result may occur with improper specimen collection / handling, submission of specimen other than nasopharyngeal swab, presence of viral mutation(s) within the areas targeted by this assay, and inadequate number of viral copies (<250 copies / mL). A negative result must be combined with clinical observations, patient history, and epidemiological information.  Fact Sheet for Patients:   StrictlyIdeas.no  Fact Sheet for Healthcare Providers: BankingDealers.co.za  This test is not yet approved or  cleared by the [place] FDA and has been authorized for detection and/or diagnosis of SARS-CoV-2 by FDA under an Emergency Use Authorization (EUA).  This EUA will remain in effect (meaning this test can be used) for the duration of the COVID-19 declaration under Section 564(b)(1) of the Act, 21 U.S.C. section 360bbb-3(b)(1), unless the authorization is terminated or revoked sooner.  Performed at [facility], [address] [place], [place] [address]       Imaging Studies   US RENAL  Result Date: 01-Nov-2019 CLINICAL DATA:  Acute kidney injury EXAM: RENAL / URINARY TRACT ULTRASOUND COMPLETE COMPARISON:  None. FINDINGS: Right Kidney: Renal measurements: 9.2 x 5.0 x 4.2 cm = volume: 101.0 mL. Echogenicity is increased. Renal cortical thickness is low normal. No perinephric fluid or hydronephrosis visualized. There is a cyst in the upper to mid right kidney measuring 1.1 x 1.2 x 1.4 cm. No sonographically demonstrable calculus or ureterectasis. Left Kidney: Renal measurements: 9.5 x 4.3 x 4.1 cm = volume: 86.1 mL. Echogenicity is increased. The renal  cortical thickness is low. No perinephric fluid or hydronephrosis visualized. There is a cyst in the upper pole left kidney measuring 1.0 x 0.8 x 0.8 cm. There is a cyst in the lower pole left kidney measuring 1.0 x 0.7 x 0.9 cm. No sonographically demonstrable calculus or ureterectasis. Bladder: The urinary bladder wall thickness is normal. There is suspected mild debris in the dependent portion of the bladder. Flow from each distal ureter seen within the bladder. Other: None. IMPRESSION: 1. Increased renal echogenicity bilaterally, a finding indicative of medical renal disease. No obstructing focus in either kidney. 2.  Small renal cysts bilaterally. 3. Question debris within the bladder. Correlation with urinalysis in this regard advised. Electronically Signed   By: Lowella Grip III M.D.   On: 11-01-19 09:14     Medications   Scheduled Meds: . cholecalciferol  1,000 Units Oral Daily  . DULoxetine  30 mg Oral Daily  . metoprolol tartrate  25 mg Oral BID  . pantoprazole  20 mg Oral Daily  . polyethylene glycol  17 g Oral Daily  . rosuvastatin  40 mg Oral Daily  . senna  1 tablet Oral QHS  . sodium chloride flush  3 mL Intravenous Q12H  . sodium zirconium cyclosilicate  10 g Oral TID  . traZODone  100 mg Oral QHS   Continuous Infusions: . sodium chloride         LOS: 5 days    Time spent: 25 minutes    Ezekiel Slocumb, DO [facility]  10/09/2019, 8:22 AM    If 7PM-7AM, please contact night-coverage. How to contact the [facility] Attending or Consulting provider [place] or covering provider during after hours [place], for this patient?    1. Check the care team in [facility] and look for a) attending/consulting [facility] provider listed and b) the [facility] team listed 2. Log into [email or web address] and use [place]'s universal password to access. If you do not have the password, please contact the hospital operator. 3. Locate the [facility] provider you are looking for under [facility]  and page to a number that you can be directly reached. 4. If you still have difficulty reaching the provider, please page the [facility] (Director on Call) for the Hospitalists listed on amion for assistance.

## 2019-10-10 LAB — RETICULOCYTES
Immature Retic Fract: 19.6 % — ABNORMAL HIGH (ref 2.3–15.9)
RBC.: 2.93 MIL/uL — ABNORMAL LOW (ref 3.87–5.11)
Retic Count, Absolute: 29.3 10*3/uL (ref 19.0–186.0)
Retic Ct Pct: 1 % (ref 0.4–3.1)

## 2019-10-10 LAB — BASIC METABOLIC PANEL
Anion gap: 10 (ref 5–15)
BUN: 39 mg/dL — ABNORMAL HIGH (ref 8–23)
CO2: 29 mmol/L (ref 22–32)
Calcium: 10.6 mg/dL — ABNORMAL HIGH (ref 8.9–10.3)
Chloride: 98 mmol/L (ref 98–111)
Creatinine, Ser: 1.26 mg/dL — ABNORMAL HIGH (ref 0.44–1.00)
GFR calc Af Amer: 44 mL/min — ABNORMAL LOW (ref >60)
GFR calc non Af Amer: 38 mL/min — ABNORMAL LOW (ref >60)
Glucose, Bld: 137 mg/dL — ABNORMAL HIGH (ref 70–99)
Potassium: 4 mmol/L (ref 3.5–5.1)
Sodium: 137 mmol/L (ref 135–145)

## 2019-10-10 LAB — CBC
HCT: 28.6 % — ABNORMAL LOW (ref 36.0–46.0)
Hemoglobin: 8.3 g/dL — ABNORMAL LOW (ref 12.0–15.0)
MCH: 29 pg (ref 26.0–34.0)
MCHC: 29 g/dL — ABNORMAL LOW (ref 30.0–36.0)
MCV: 100 fL (ref 80.0–100.0)
Platelets: 220 10*3/uL (ref 150–400)
RBC: 2.86 MIL/uL — ABNORMAL LOW (ref 3.87–5.11)
RDW: 15.4 % (ref 11.5–15.5)
WBC: 4.1 10*3/uL (ref 4.0–10.5)
nRBC: 0 % (ref 0.0–0.2)

## 2019-10-10 LAB — IRON AND TIBC
Iron: 22 ug/dL — ABNORMAL LOW (ref 28–170)
Saturation Ratios: 9 % — ABNORMAL LOW (ref 10.4–31.8)
TIBC: 256 ug/dL (ref 250–450)
UIBC: 234 ug/dL

## 2019-10-10 LAB — VITAMIN B12: Vitamin B-12: 166 pg/mL — ABNORMAL LOW (ref 180–914)

## 2019-10-10 LAB — FERRITIN: Ferritin: 45 ng/mL (ref 11–307)

## 2019-10-10 LAB — FOLATE: Folate: 11.3 ng/mL (ref >5.9)

## 2019-10-10 NOTE — Progress Notes (Addendum)
PROGRESS NOTE    Evelyn Simmons   EYC:[id number]  DOB: 1929-12-12  PCP: [facility]    DOA: 10/04/2019 LOS: 6   Brief Narrative   Evelyn Simmons  is a 86 y.o. Caucasian female with a known history of COPD and chronic respiratory failure on home O2 at 3 L/min, type diabetes mellitus, systolic and diastolic CHF, atrial fibrillation, status post pacemaker, dyslipidemia, hypertension and hypothyroidism, who presented to the emergency room with worsening dyspnea, 3 pillow orthopnea and paroxysmal nocturnal dyspnea.  No significant edema.  She also reported been having dark stools, and some bright red bleeding per rectum prior to admission.  Has history of diverticulosis and diverticulitis.  Negative guaiac in the ED.   ED course: HR 112, RR 24, O2 sat 92% on RA, hypertensive 180/63, afebrile.  Notable labs Cr 1.35, BUN 25, BNP 259, Hbg down to 9.4 from 11 on 09/17/19. Chest xray showed "persistent cardiomegaly with pulmonary vascular congestion. Trace pleural effusions bilaterally. There may be a degree of underlying congestive heart failure.  There is airspace opacity in the left lower lung region concerning for superimposed developing pneumonia." EKG showed atrial fibrillation with RVRin 120's.    Recent 2D echo revealed an EF of 45 to 50% with mildly diminished systolic function, mild concentric LVH and inability to detect diastolic function, severe left atrial dilatation and mild right atrial dilatation.      Assessment & Plan   Active Problems:   Acute CHF (congestive heart failure) (HCC)   Persistent atrial fibrillation (HCC)   Acute on chronic systolic and likely diastolic CHF - POA.  Presented with progressive dyspnea, orthopnea, PND, imaging showed pulmonary vascular congestion, all consistent with decompensated CHF.  Recent Echo on 8/7 showed EF 45-50%, indeterminate diastolic parameters, mild MR.   --Cardiology following -Started on torsemide 20 mg twice  daily --May require extra torsemide for weight gain or progressive shortness of breath --strict I/O's and daily weights --continue metoprolol --no ACEI/ARB currently due to low BP's and now AKI --Low-sodium diet with fluid restriction --Pt wants outpatient follow up with [facility] cardiology, Dr. Rockey Situ   Hyperkalemia - not present on admission.  K 6.0 this AM.  Given Lokelma.  Repeat K level this afternoon.  Persistent A-fib with intermittent RVR - CHA2DS2-VASc score is 6.  Continue amiodaroneand will utilize IV drip as needed.  Monitor on telemetry.  Continue Toprol 25 mg PO daily.  Previously not on anticoagulation due to anemia.    GI bleeding - reported melena and some BRBPR on admission, with known history diverticulosis, this islikely lower GI source.  Hbg is being trended and stable around 9 (down from 11 earlier this month).  This AM Hbg slightly down at 8.4. Finally had BM on 8/27 which was normal brown color, had been dark black tarry prior to admission. --continue trend H/H --transfuse if Hbg < 7.0 --GI was consulted - cancelled for now given negative FOBT and no BM's yet this admission.  --started stool softeners --will monitor closely and get GI on board if recurs.   -Hold aspirin and all anticoagulants for now --Continue PO Protonix  AKI superimposed on CKD stage IIIb - AKI present on admission with Cr 1.59.  Baseline Cr appears to be 1.2-1.3 recently.     Presented with Cr 1.59 >> 1.31 >> 1.22 >> 1.41 >> 1.77 >> 1.63 >> 1.36 (baseline) today.   AKI secondary to diuresis.  Renal US negative for obstruction or other cause.   Daily  BMP to monitor.   Avoid nephrotoxins and hypotension, maintain MAP greater than 65.  Renally dose meds as indicated.    Neck Pain due to muscle spasms - due to poor resting posture in the bed.  Please use extra pillows to keep patient's head from tipping to the right when resting.  Heat packs.  Tylenol.  Has PRN Flexeril, please use.  Topical menthol  added.  Generalized weakness -PT recommends SNF, however patient is adamant about returning home.  She says her son will be coming to stay with her and can stay as long as as needed.   --Ambulate patient at least each shift --Out of bed to chair daily and is much as tolerated --Will arrange for home health services.  TOC is following.  Abnormal chest xray - CXR on admission showed a possible LLL opacity concerning for PNA.  Pt has been afebrile, without respiratory complaints since diuresed, no leukocytosis, negative procal.  Hold off antibiotics and monitor.  Has remained stable.  Sick sinus syndrome - s/p pacemaker.  Follow up with EP, Dr. Caryl Comes.  Essential hypertension - chronic, stable. Continue Toprol.   Depression and anxiety - continue Cymbalta and Seroquel as well as Xanax.  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  Continue sliding scale NovoLog and Neurontin.  Continue basal insulin.    Dyslipidemia - continue statin   GERD - continue home Protonix  Obesity: Body mass index is 37.02 kg/m.  Complicates overall care and prognosis.   DVT prophylaxis: Place and maintain sequential compression device Start: 10/04/19 2327   Primary Care through [place] program.  Dionicia Abler [phone].     Diet:  Diet Orders (From admission, onward)    Start     Ordered   10/09/19 1607  Diet Heart Room service appropriate? Yes; Fluid consistency: Thin; Fluid restriction: 1500 mL Fluid  Diet effective now       Question Answer Comment  Room service appropriate? Yes   Fluid consistency: Thin   Fluid restriction: 1500 mL Fluid      10/09/19 1606            Code Status: DNR    Subjective 10/10/19    Patient seen at bedside today.  Says she feels well and looks forward to going home tomorrow.  Denies SOB at rest.  Has not been up out of bed much, but RN at bedside reports one person assist to be adequate.  Neck pain is better  today.  Disposition Plan & Communication   Status is: Inpatient  Remains inpatient appropriate because:IV treatments appropriate due to intensity of illness or inability to take PO. Hyperkalemia.     Dispo: The patient is from: Home              Anticipated d/c is to: SNF              Anticipated d/c date is: 1 day              Patient currently is not medically stable to d/c.   Family Communication: none at bedside, will attempt to call   Spoke with [facility], Dionicia Abler on 8/27, regarding patient's status, plans and expected d/c to rehab.  Will update her tomorrow AM about d/c home with [facility] services and son to be staying with patient 24/hrs/day.   Consults, Procedures, Significant Events   Consultants:   Cardiology  Gastroenterology  Procedures:   None    Objective  Vitals:   10/10/19 0512 10/10/19 0514 10/10/19 0600 10/10/19 0900  BP: (!) 98/47 (!) 93/42 110/79 (!) 100/54  Pulse: 82 70  75  Resp: 20   20  Temp: 98.2 F (36.8 C)   97.9 F (36.6 C)  TempSrc: Oral   Oral  SpO2: 97% 99%  98%  Weight: 83.1 kg     Height:        Intake/Output Summary (Last 24 hours) at 10/10/2019 1623 Last data filed at 10/10/2019 1401 Gross per 24 hour  Intake 718 ml  Output 700 ml  Net 18 ml   Filed Weights   10/08/19 0430 10/09/19 0336 10/10/19 0512  Weight: 78 kg 82.2 kg 83.1 kg    Physical Exam:  General exam: awake, alert, no acute distress, obese Respiratory system: CTAB, Normal respiratory effort. Cardiovascular system: normal S1/S2, RRR, no pedal edema.   Central nervous system: A&O x3. no gross focal neurologic deficits, normal speech Extremities: moves all, no cyanosis, normal tone Psychiatry: normal mood, congruent affect  Labs   Data Reviewed: I have personally reviewed following labs and imaging studies  CBC: Recent Labs  Lab 10/06/19 0434 10/07/19 0627 10/08/19 0718 10/09/19 0649 10/10/19 0552  WBC 5.5 5.7 6.1 5.4 4.1  NEUTROABS 3.6  --   --    --   --   HGB 8.9* 9.3* 8.6* 8.4* 8.3*  HCT 29.2* 30.2* 29.0* 28.1* 28.6*  MCV 97.7 96.2 98.3 98.3 100.0  PLT 244 250 229 226 329   Basic Metabolic Panel: Recent Labs  Lab 10/04/19 0935 10/05/19 0605 10/06/19 0434 10/06/19 0434 10/07/19 0627 10/08/19 0718 10/08/19 1612 10/09/19 0649 10/10/19 0552  NA 146*   < > 143  --  139 138  --  138 137  K 4.1   < > 4.3   < > 5.2* 6.0* 5.8* 5.5* 4.0  CL 107   < > 103  --  100 102  --  101 98  CO2 29   < > 33*  --  32 35*  --  31 29  GLUCOSE 138*   < > 113*  --  141* 136*  --  142* 137*  BUN 25*   < > 25*  --  35* 35*  --  35* 39*  CREATININE 1.31*   < > 1.41*  --  1.74* 1.63*  --  1.36* 1.26*  CALCIUM 10.4*   < > 9.7  --  10.0 10.6*  --  10.4* 10.6*  MG 2.0  --  1.8  --  2.0 1.9  --  2.0  --   PHOS  --   --  3.0  --   --   --   --   --   --    < > = values in this interval not displayed.   GFR: Estimated Creatinine Clearance: 28.8 mL/min (A) (by C-G formula based on SCr of 1.26 mg/dL (H)). Liver Function Tests: Recent Labs  Lab 10/06/19 0434  AST 16  ALT 20  ALKPHOS 51  BILITOT 0.9  PROT 5.5*  ALBUMIN 2.9*   No results for input(s): LIPASE, AMYLASE in the last 168 hours. No results for input(s): AMMONIA in the last 168 hours. Coagulation Profile: No results for input(s): INR, PROTIME in the last 168 hours. Cardiac Enzymes: No results for input(s): CKTOTAL, CKMB, CKMBINDEX, TROPONINI in the last 168 hours. BNP (last 3 results) No results for input(s): PROBNP in the last 8760 hours. HbA1C: No results for  input(s): HGBA1C in the last 72 hours. CBG: Recent Labs  Lab 10/06/19 0612  GLUCAP 125*   Lipid Profile: No results for input(s): CHOL, HDL, LDLCALC, TRIG, CHOLHDL, LDLDIRECT in the last 72 hours. Thyroid Function Tests: No results for input(s): TSH, T4TOTAL, FREET4, T3FREE, THYROIDAB in the last 72 hours. Anemia Panel: Recent Labs    10/10/19 0907  FOLATE 11.3  FERRITIN 45  TIBC 256  IRON 22*  RETICCTPCT 1.0    Sepsis Labs: Recent Labs  Lab 10/04/19 0935 10/07/19 0627  PROCALCITON <0.10 <0.10    Recent Results (from the past 240 hour(s))  SARS Coronavirus 2 by RT PCR (hospital order, performed in [facility] hospital lab) Nasopharyngeal Nasopharyngeal Swab     Status: None   Collection Time: 10/04/19  6:56 PM   Specimen: Nasopharyngeal Swab  Result Value Ref Range Status   SARS Coronavirus 2 NEGATIVE NEGATIVE Final    Comment: (NOTE) SARS-CoV-2 target nucleic acids are NOT DETECTED.  The SARS-CoV-2 RNA is generally detectable in upper and lower respiratory specimens during the acute phase of infection. The lowest concentration of SARS-CoV-2 viral copies this assay can detect is 250 copies / mL. A negative result does not preclude SARS-CoV-2 infection and should not be used as the sole basis for treatment or other patient management decisions.  A negative result may occur with improper specimen collection / handling, submission of specimen other than nasopharyngeal swab, presence of viral mutation(s) within the areas targeted by this assay, and inadequate number of viral copies (<250 copies / mL). A negative result must be combined with clinical observations, patient history, and epidemiological information.  Fact Sheet for Patients:   StrictlyIdeas.no  Fact Sheet for Healthcare Providers: BankingDealers.co.za  This test is not yet approved or  cleared by the [place] FDA and has been authorized for detection and/or diagnosis of SARS-CoV-2 by FDA under an Emergency Use Authorization (EUA).  This EUA will remain in effect (meaning this test can be used) for the duration of the COVID-19 declaration under Section 564(b)(1) of the Act, 21 U.S.C. section 360bbb-3(b)(1), unless the authorization is terminated or revoked sooner.  Performed at [facility], [address], [place], [place] [address]       Imaging  Studies   No results found.   Medications   Scheduled Meds: . cholecalciferol  1,000 Units Oral Daily  . DULoxetine  30 mg Oral Daily  . metoprolol tartrate  25 mg Oral BID  . pantoprazole  20 mg Oral Daily  . polyethylene glycol  17 g Oral Daily  . rosuvastatin  40 mg Oral Daily  . senna  1 tablet Oral QHS  . sodium chloride flush  3 mL Intravenous Q12H  . torsemide  20 mg Oral BID  . traZODone  100 mg Oral QHS   Continuous Infusions: . sodium chloride         LOS: 6 days    Time spent: 25 minutes    Ezekiel Slocumb, DO [facility]  10/10/2019, 4:23 PM    If 7PM-7AM, please contact night-coverage. How to contact the [facility] Attending or Consulting provider [place] or covering provider during after hours [place], for this patient?    1. Check the care team in [facility] and look for a) attending/consulting [facility] provider listed and b) the [facility] team listed 2. Log into [email or web address] and use [place]'s universal password to access. If you do not have the password, please contact the hospital operator.  3. Locate the [facility] provider you are looking for under [facility] and page to a number that you can be directly reached. 4. If you still have difficulty reaching the provider, please page the [facility] (Director on Call) for the Hospitalists listed on amion for assistance.

## 2019-10-11 MED ORDER — METOPROLOL TARTRATE 25 MG PO TABS
25.0000 mg | ORAL_TABLET | Freq: Two times a day (BID) | ORAL | 1 refills | Status: DC
Start: 2019-10-11 — End: 2019-10-23

## 2019-10-11 MED ORDER — SODIUM CHLORIDE 0.9 % IV SOLN
300.0000 mg | Freq: Once | INTRAVENOUS | Status: DC
  Filled 2019-10-11: qty 15

## 2019-10-11 MED ORDER — CYANOCOBALAMIN 1000 MCG PO TABS: 1000.0000 ug | ORAL_TABLET | Freq: Every day | ORAL | 1 refills | Status: DC

## 2019-10-11 MED ORDER — FERROUS SULFATE 325 (65 FE) MG PO TABS
325.0000 mg | ORAL_TABLET | Freq: Two times a day (BID) | ORAL | 1 refills | Status: DC
Start: 2019-10-11 — End: 2020-07-25

## 2019-10-11 MED ORDER — TORSEMIDE 20 MG PO TABS
20.0000 mg | ORAL_TABLET | Freq: Two times a day (BID) | ORAL | 1 refills | Status: DC
Start: 2019-10-11 — End: 2019-10-23

## 2019-10-11 MED ORDER — FERROUS SULFATE 325 (65 FE) MG PO TABS: 325.0000 mg | ORAL_TABLET | Freq: Two times a day (BID) | ORAL | Status: DC

## 2019-10-11 MED ORDER — VITAMIN B-12 1000 MCG PO TABS
1000.0000 ug | ORAL_TABLET | Freq: Every day | ORAL | Status: DC
  Administered 2019-10-11: 1000 ug via ORAL
  Filled 2019-10-11: qty 1

## 2019-10-11 NOTE — Discharge Summary (Signed)
Physician Discharge Summary  Evelyn Simmons WUJ:[id number] DOB: [birth year] DOA: 10/04/2019  PCP: Dionicia Abler, NP  Admit date: 10/04/2019 Discharge date: 10/19/2019  Admitted From: home Disposition:  home  Recommendations for Outpatient Follow-up:  1. Follow up with PCP in 1-2 weeks 2. Please obtain BMP/CBC in one week 3. Please follow up with cardiology as scheduled 4. Patient follow up and reinforce fluid restriction with patient for volume management   Home Health: PT, OT, RN, Aide, SW (patient declined SNF recommendation)  Equipment/Devices:   Discharge Condition: Stable  CODE STATUS: Full  Diet recommendation: Heart Healthy with fluid restriction 1500 cc/day   Discharge Diagnoses: Active Problems:   Acute CHF (congestive heart failure) (HCC)   Persistent atrial fibrillation (HCC)    Summary of HPI and Hospital Course:  Evelyn Simmons  is a 86 y.o. Caucasian female with a known history of COPD and chronic respiratory failure on home O2 at 3 L/min, type diabetes mellitus, systolic and diastolic CHF, atrial fibrillation, status post pacemaker, dyslipidemia, hypertension and hypothyroidism, who presented to the emergency room with worsening dyspnea, 3 pillow orthopnea and paroxysmal nocturnal dyspnea.  No significant edema.  She also reported been having dark stools, and some bright red bleeding per rectum prior to admission.  Has history of diverticulosis and diverticulitis.  Negative guaiac in the ED.   ED course: HR 112, RR 24, O2 sat 92% on RA, hypertensive 180/63, afebrile.  Notable labs Cr 1.35, BUN 25, BNP 259, Hbg down to 9.4 from 11 on 09/17/19. Chest xray showed "persistent cardiomegaly with pulmonary vascular congestion. Trace pleural effusions bilaterally. There may be a degree of underlying congestive heart failure.  There is airspace opacity in the left lower lung region concerning for superimposed developing pneumonia." EKG showed atrial fibrillation with RVRin 120's.     Recent 2D echo revealed an EF of 45 to 50% with mildly diminished systolic function, mild concentric LVH and inability to detect diastolic function, severe left atrial dilatation and mild right atrial dilatation.      Acute on chronic systolic and likely diastolic CHF - POA. Presented with progressive dyspnea, orthopnea, PND, imaging showed pulmonary vascular congestion, all consistent with decompensated CHF.   Recent Echo on 8/7 showed EF 45-50%, indeterminate diastolic parameters, mild MR.   --Cardiology was consulted --Improved with IV diuresis and transitioned to torsemide 20 mg twice daily --Close follow up in HF clinic and with EF, Dr. Caryl Comes --Consider extra PRN torsemide for weight gain or progressive shortness of breath --Patient counseled extensively on importance of fluid restriction, low sodium diet and daily weights --continue metoprolol --no ACEI/ARB currently due to low BP's  --Pt wants outpatient follow up with [facility] cardiology, Dr. Rockey Situ   Hyperkalemia - not present on admission.  Given Lokelma.  Resolved.  Follow up BMP in 1 week.  Persistent A-fib with intermittent RVR - CHA2DS2-VASc score is 6.  Rate now controlled.  Continue amiodarone.   Continue Toprol 25 mg PO daily.   Not on anticoagulation due to anemia, prior GI bleeding, fall risk.    GI bleeding - reported melena and some BRBPR on admission, with known history diverticulosis, this islikely lower GI source.  Hbg trended and stable around mid 8's to 9 (down from 11 earlier this month).   She was constipated most of admission, had a BM on 8/27 which was normal brown color, had been dark black tarry prior to admission.  FOBT was also negative.  GI consult was cancelled at patient to follow  up outpatient as needed. -Hold aspirin and all anticoagulants for now --Continue home Protonix  AKI superimposed on CKD stage IIIb - AKI present on admission with Cr 1.59.  Baseline Cr appears to be 1.2-1.3 recently.      Trend Cr 1.59 >> 1.31 >> 1.22 >> 1.41 >> 1.77 >> 1.63 >> 1.36 >> 1.26 (baseline)  AKI secondary to diuresis.   Renal US negative for obstruction or other cause.   Follow up BMP in 1 week.  Neck Pain due to muscle spasms - due to poor resting posture in the bed.  Improved with heat packs, Tylenol.  PRN Flexeril.  Topical menthol.    Generalized weakness -PT recommends SNF, however patient is adamant about returning home.  Reported that her son will be coming to stay with her and can stay as long as as needed, will have 24 hr assistance.   --Home health services arranged  Abnormal chest xray - CXR on admission showed a possible LLL opacity concerning for PNA.  Pt has been afebrile, without respiratory complaints since diuresed, no leukocytosis, negative procal.  Hold off antibiotics and monitor.  Has remained stable.  Sick sinus syndrome - s/p pacemaker.  Follow up with EP, Dr. Caryl Comes.  Essential hypertension - chronic, stable. Continue Toprol.  Depression and anxiety - continue Cymbalta and Seroquel as well as Xanax.  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  On Vicctoza and gabapentin. Covered with insulin during admission.  Dyslipidemia - continue statin   GERD - continue home Protonix  Obesity: Body mass index is 37.02 kg/m.  Complicates overall care and prognosis.     Discharge Instructions   Discharge Instructions    (HEART FAILURE PATIENTS) Call MD:  Anytime you have any of the following symptoms: 1) 3 pound weight gain in 24 hours or 5 pounds in 1 week 2) shortness of breath, with or without a dry hacking cough 3) swelling in the hands, feet or stomach 4) if you have to sleep on extra pillows at night in order to breathe.   Complete by: As directed    Call MD for:  extreme fatigue   Complete by: As directed    Call MD for:  persistant dizziness or light-headedness   Complete by: As directed    Call MD for:   persistant nausea and vomiting   Complete by: As directed    Call MD for:  severe uncontrolled pain   Complete by: As directed    Call MD for:  temperature >100.4   Complete by: As directed    Diet - low sodium heart healthy   Complete by: As directed    Discharge instructions   Complete by: As directed    We changed your diuretic (water pill).  You should stop taking Lasix (furosemide).  We started you on torsemide (aka Demadex).   You should take Torsemide 20 mg twice daily.  If you have weight gain, worsening shortness of breath, you can take an extra dose of torsemide. Please call your primary care doctor if you have to do this, so they can evaluate your dose and change it if needed.  Please watch your fluid intake at home.  Try not to consume more than about 1.5 liters of fluid per day. Follow a LOW SODIUM diet as well to prevent fluid retention.   Increase activity slowly   Complete by: As directed      Allergies as of 10/11/2019  Reactions   Penicillins Nausea And Vomiting, Rash, Hives   Other Other (See Comments)   Sulfa Antibiotics    Pt unsure reaction, was told in hospital she had allergy   Penicillin G Rash      Medication List    STOP taking these medications   furosemide 40 MG tablet Commonly known as: LASIX   metoprolol succinate 25 MG 24 hr tablet Commonly known as: TOPROL-XL   Potassium Chloride ER 20 MEQ Tbcr   VITRON-C PO     TAKE these medications   Accu-Chek Nano SmartView w/Device Kit Please issue nano meter by AccuChek along with test strips at checking frequency of 2x daily. 90 day supply. E11.9   albuterol 108 (90 Base) MCG/ACT inhaler Commonly known as: VENTOLIN HFA Inhale 2 puffs into the lungs every 6 (six) hours as needed for wheezing or shortness of breath.   amiodarone 100 MG tablet Commonly known as: PACERONE Take 1 tablet (100 mg total) by mouth daily.   aspirin EC 81 MG tablet Take 81 mg by mouth daily.    cholecalciferol 1000 units tablet Commonly known as: VITAMIN D Take 1,000 Units by mouth daily.   cyanocobalamin 1000 MCG tablet Take 1 tablet (1,000 mcg total) by mouth daily.   cyclobenzaprine 5 MG tablet Commonly known as: FLEXERIL Take 5 mg by mouth 3 (three) times daily as needed for muscle spasms.   DULoxetine 30 MG capsule Commonly known as: CYMBALTA Take 30 mg by mouth daily.   ferrous sulfate 325 (65 FE) MG tablet Take 1 tablet (325 mg total) by mouth 2 (two) times daily with a meal.   freestyle lancets Use as instructed   glucose blood test strip Commonly known as: FREESTYLE LITE Use as instructed   ipratropium-albuterol 0.5-2.5 (3) MG/3ML Soln Commonly known as: DUONEB Take 3 mLs by nebulization.   liraglutide 18 MG/3ML Sopn Commonly known as: VICTOZA Inject 0.6 mg into the skin daily.   metoprolol tartrate 25 MG tablet Commonly known as: LOPRESSOR Take 1 tablet (25 mg total) by mouth 2 (two) times daily.   pantoprazole 20 MG tablet Commonly known as: PROTONIX Take 20 mg by mouth daily.   rosuvastatin 40 MG tablet Commonly known as: CRESTOR Take 1 tablet (40 mg total) by mouth daily.   SENEXON-S PO Take by mouth. Take two tablets by mouth once daily   torsemide 20 MG tablet Commonly known as: DEMADEX Take 1 tablet (20 mg total) by mouth 2 (two) times daily.   traZODone 100 MG tablet Commonly known as: DESYREL Take 100 mg by mouth at bedtime.       Follow-up Information    [facility] up on 10/20/2019.   Specialty: Cardiology Why: at 12:00pm. Enter through the [place] entrance Contact information: [place]             Allergies  Allergen Reactions  . Penicillins Nausea And Vomiting, Rash and Hives  . Other Other (See Comments)  . Sulfa Antibiotics     Pt unsure reaction, was told in hospital she had allergy   . Penicillin G Rash    Consultations:  Cardiology    Procedures/Studies: DG Chest 1 View  Result Date: 10/18/2019 CLINICAL DATA:  Pt presents via acems with c/o shortness of breath and congestion. Pt has hx of CHF. Pt chronically wears 2L at home. Pt respirations currently even and unlabored EXAM: CHEST  1 VIEW COMPARISON:  10/04/2019 FINDINGS: Cardiac silhouette is mildly enlarged. Stable left anterior chest wall dual lead pacemaker. No mediastinal or hilar masses. Vascular congestion with mild interstitial prominence similar to the prior exam. Possible small pleural effusions. No lung consolidation to suggest pneumonia. No pneumothorax. Skeletal structures are demineralized but grossly intact. IMPRESSION: 1. Cardiomegaly, vascular congestion and interstitial prominence, with probable small effusions, similar to the prior study. Suspect mild congestive heart failure. No evidence of pneumonia. Electronically Signed   By: Lajean Manes M.D.   On: 10/18/2019 18:58   DG Chest 2 View  Result Date: 10/04/2019 CLINICAL DATA:  Shortness of breath with chest congestion EXAM: CHEST - 2 VIEW COMPARISON:  October 02, 2019 FINDINGS: There is ill-defined airspace opacity in the left lower lung region. There is a minimal pleural effusion on each side. No appreciable interstitial edema. There is cardiomegaly with mild pulmonary venous hypertension. Pacemaker leads are attached to the right atrium and right ventricle. There is aortic atherosclerosis. No adenopathy. No bone lesions. IMPRESSION: Persistent cardiomegaly with pulmonary vascular congestion. Trace pleural effusions bilaterally. There may be a degree of underlying congestive heart failure. There is airspace opacity in the left lower lung region concerning for superimposed developing pneumonia. Pacemaker leads attached to right atrium and right ventricle. Aortic Atherosclerosis (ICD10-I70.0). Electronically Signed   By: Lowella Grip III M.D.   On:  10/04/2019 10:08   DG Chest 2 View  Result Date: 10/02/2019 CLINICAL DATA:  86 year old female with history of shortness of breath. EXAM: CHEST - 2 VIEW COMPARISON:  Chest x-ray 09/17/2019. FINDINGS: Lung volumes are decreased. There is cephalization of the pulmonary vasculature and slight indistinctness of the interstitial markings suggestive of mild pulmonary edema. Trace left pleural effusion. No right pleural effusion. Heart size is mildly enlarged. Upper mediastinal contours are within normal limits. Aortic atherosclerosis. Left-sided pacemaker device in place with lead tips projecting over the expected location of the right atrium and right ventricle. IMPRESSION: 1. The appearance of the chest suggests mild congestive heart failure, as above. 2. Aortic atherosclerosis. Electronically Signed   By: Vinnie Langton M.D.   On: 10/02/2019 12:00   US RENAL  Result Date: 10/07/2019 CLINICAL DATA:  Acute kidney injury EXAM: RENAL / URINARY TRACT ULTRASOUND COMPLETE COMPARISON:  None. FINDINGS: Right Kidney: Renal measurements: 9.2 x 5.0 x 4.2 cm = volume: 101.0 mL. Echogenicity is increased. Renal cortical thickness is low normal. No perinephric fluid or hydronephrosis visualized. There is a cyst in the upper to mid right kidney measuring 1.1 x 1.2 x 1.4 cm. No sonographically demonstrable calculus or ureterectasis. Left Kidney: Renal measurements: 9.5 x 4.3 x 4.1 cm = volume: 86.1 mL. Echogenicity is increased. The renal cortical thickness is low. No perinephric fluid or hydronephrosis visualized. There is a cyst in the upper pole left kidney measuring 1.0 x 0.8 x 0.8 cm. There is a cyst in the lower pole left kidney measuring 1.0 x 0.7 x 0.9 cm. No sonographically demonstrable calculus or ureterectasis. Bladder: The urinary bladder wall thickness is normal. There is suspected mild debris in the dependent portion of the bladder. Flow from each distal ureter seen within the bladder. Other: None. IMPRESSION:  1. Increased renal echogenicity bilaterally, a finding indicative of medical renal disease. No obstructing focus in either kidney. 2.  Small renal cysts bilaterally. 3. Question debris within the bladder. Correlation with urinalysis in this regard advised. Electronically Signed   By: Lowella Grip III M.D.   On: 10/07/2019 09:14  DG Foot 2 Views Right  Result Date: 10/18/2019 CLINICAL DATA:  Pain and discoloration to the right great toe. Patient had a frozen pack of cheese fall on it. EXAM: RIGHT FOOT - 2 VIEW COMPARISON:  None. FINDINGS: No fracture. There is flattening of the medial aspect of the distal first metatarsal and the metatarsal head consistent with previous bunion surgery. The first metatarsophalangeal joint show significant narrowing with subchondral sclerosis. The base of the proximal phalanx of the great toe is narrowed which may be from erosions or from previous surgery. IP joint is normally aligned. Remaining joints are normally aligned. Bones are demineralized. There is soft tissue swelling of the great toe and of the forefoot most evident dorsally. Arterial vascular calcifications extend from the ankle to the distal foot and toes. IMPRESSION: 1. No fracture or dislocation. 2. Advanced arthropathic changes at the first metatarsophalangeal joint, most likely osteoarthritis. There are postsurgical changes consistent with a previous bunionectomy. 3. Great toe and forefoot soft tissue swelling. Electronically Signed   By: Lajean Manes M.D.   On: 10/18/2019 20:24        Subjective: Patient seen this AM at bedside.  Denies any shortness of breath, fever, chills, cough, or other complaints.  Says her son is coming today.  Looks forward to getting home.  Denies any dark or bloody stool.  Had normal BM yesterday.  Neck feels better today.   Discharge Exam: Vitals:   10/11/19 0903 10/11/19 1232  BP: (!) 110/57 (!) 122/54  Pulse: 72 65  Resp: 19 19  Temp: 98 F (36.7 C) 97.9 F (36.6  C)  SpO2: 93% 97%   Vitals:   10/10/19 2024 10/11/19 0508 10/11/19 0903 10/11/19 1232  BP: 110/66 (!) 110/58 (!) 110/57 (!) 122/54  Pulse: 77 89 72 65  Resp: _0 Temp: 97.6 F (36.4 C) 98.7 F (37.1 C) 98 F (36.7 C) 97.9 F (36.6 C)  TempSrc: Oral Oral Oral Oral  SpO2: 98% 96% 93% 97%  Weight:  83.2 kg    Height:        General: Pt is alert, awake, not in acute distress, obese Cardiovascular: RRR, S1/S2 +, no rubs, no gallops Respiratory: CTA bilaterally diminished bases due to body habitus, no wheezing, no rhonchi Abdominal: Soft, NT, ND, bowel sounds + Extremities: no edema, no cyanosis    The results of significant diagnostics from this hospitalization (including imaging, microbiology, ancillary and laboratory) are listed below for reference.     Microbiology: Recent Results (from the past 240 hour(s))  SARS Coronavirus 2 by RT PCR (hospital order, performed in [facility] hospital lab) Nasopharyngeal Nasopharyngeal Swab     Status: None   Collection Time: 10/18/19  6:43 PM   Specimen: Nasopharyngeal Swab  Result Value Ref Range Status   SARS Coronavirus 2 NEGATIVE NEGATIVE Final    Comment: (NOTE) SARS-CoV-2 target nucleic acids are NOT DETECTED.  The SARS-CoV-2 RNA is generally detectable in upper and lower respiratory specimens during the acute phase of infection. The lowest concentration of SARS-CoV-2 viral copies this assay can detect is 250 copies / mL. A negative result does not preclude SARS-CoV-2 infection and should not be used as the sole basis for treatment or other patient management decisions.  A negative result may occur with improper specimen collection / handling, submission of specimen other than nasopharyngeal swab, presence of viral mutation(s) within the areas targeted by this assay, and inadequate number of viral copies (<250 copies / mL).  A negative result must be combined with clinical observations, patient history, and  epidemiological information.  Fact Sheet for Patients:   StrictlyIdeas.no  Fact Sheet for Healthcare Providers: BankingDealers.co.za  This test is not yet approved or  cleared by the [place] FDA and has been authorized for detection and/or diagnosis of SARS-CoV-2 by FDA under an Emergency Use Authorization (EUA).  This EUA will remain in effect (meaning this test can be used) for the duration of the COVID-19 declaration under Section 564(b)(1) of the Act, 21 U.S.C. section 360bbb-3(b)(1), unless the authorization is terminated or revoked sooner.  Performed at [facility], [address] [place], [place] [address]      Labs: BNP (last 3 results) Recent Labs    10/04/19 0935 10/07/19 0627 10/18/19 1843  BNP 259.0* 154.1* 277.8*   Basic Metabolic Panel: Recent Labs  Lab 10/18/19 1843 10/19/19 0634  NA 145 145  K 3.5 3.3*  CL 105 105  CO2 30 33*  GLUCOSE 141* 171*  BUN 42* 37*  CREATININE 1.49* 1.32*  CALCIUM 9.6 9.8  MG 1.9 1.6*   Liver Function Tests: Recent Labs  Lab 10/18/19 1843  AST 20  ALT 19  ALKPHOS 57  BILITOT 0.6  PROT 5.8*  ALBUMIN 3.1*   No results for input(s): LIPASE, AMYLASE in the last 168 hours. No results for input(s): AMMONIA in the last 168 hours. CBC: Recent Labs  Lab 10/18/19 1843  WBC 6.3  NEUTROABS 4.1  HGB 8.7*  HCT 27.5*  MCV 93.9  PLT 293   Cardiac Enzymes: No results for input(s): CKTOTAL, CKMB, CKMBINDEX, TROPONINI in the last 168 hours. BNP: Invalid input(s): POCBNP CBG: No results for input(s): GLUCAP in the last 168 hours. D-Dimer No results for input(s): DDIMER in the last 72 hours. Hgb A1c No results for input(s): HGBA1C in the last 72 hours. Lipid Profile No results for input(s): CHOL, HDL, LDLCALC, TRIG, CHOLHDL, LDLDIRECT in the last 72 hours. Thyroid function studies No results for input(s): TSH, T4TOTAL, T3FREE, THYROIDAB in the last 72  hours.  Invalid input(s): FREET3 Anemia work up No results for input(s): VITAMINB12, FOLATE, FERRITIN, TIBC, IRON, RETICCTPCT in the last 72 hours. Urinalysis    Component Value Date/Time   COLORURINE YELLOW (A) 10/07/2019 1155   APPEARANCEUR CLOUDY (A) 10/07/2019 1155   LABSPEC 1.012 10/07/2019 1155   PHURINE 5.0 10/07/2019 1155   GLUCOSEU NEGATIVE 10/07/2019 1155   HGBUR SMALL (A) 10/07/2019 1155   BILIRUBINUR NEGATIVE 10/07/2019 1155   KETONESUR NEGATIVE 10/07/2019 1155   PROTEINUR NEGATIVE 10/07/2019 1155   NITRITE NEGATIVE 10/07/2019 1155   LEUKOCYTESUR LARGE (A) 10/07/2019 1155   Sepsis Labs Invalid input(s): PROCALCITONIN,  WBC,  LACTICIDVEN Microbiology Recent Results (from the past 240 hour(s))  SARS Coronavirus 2 by RT PCR (hospital order, performed in [facility] hospital lab) Nasopharyngeal Nasopharyngeal Swab     Status: None   Collection Time: 10/18/19  6:43 PM   Specimen: Nasopharyngeal Swab  Result Value Ref Range Status   SARS Coronavirus 2 NEGATIVE NEGATIVE Final    Comment: (NOTE) SARS-CoV-2 target nucleic acids are NOT DETECTED.  The SARS-CoV-2 RNA is generally detectable in upper and lower respiratory specimens during the acute phase of infection. The lowest concentration of SARS-CoV-2 viral copies this assay can detect is 250 copies / mL. A negative result does not preclude SARS-CoV-2 infection and should not be used as the sole basis for treatment or other patient management decisions.  A negative result may  occur with improper specimen collection / handling, submission of specimen other than nasopharyngeal swab, presence of viral mutation(s) within the areas targeted by this assay, and inadequate number of viral copies (<250 copies / mL). A negative result must be combined with clinical observations, patient history, and epidemiological information.  Fact Sheet for Patients:   StrictlyIdeas.no  Fact Sheet for Healthcare  Providers: BankingDealers.co.za  This test is not yet approved or  cleared by the [place] FDA and has been authorized for detection and/or diagnosis of SARS-CoV-2 by FDA under an Emergency Use Authorization (EUA).  This EUA will remain in effect (meaning this test can be used) for the duration of the COVID-19 declaration under Section 564(b)(1) of the Act, 21 U.S.C. section 360bbb-3(b)(1), unless the authorization is terminated or revoked sooner.  Performed at [facility], [address] [place], [place] [address]      Time coordinating discharge: Over 30 minutes  SIGNED:   Ezekiel Slocumb, DO [facility] 10/19/2019, 12:32 PM   If 7PM-7AM, please contact night-coverage [email or web address]

## 2019-10-11 NOTE — Care Management Important Message (Signed)
Important Message  Patient Details  Name: Evelyn Simmons MRN: [id number] Date of Birth: 07-Sep-1929   Medicare Important Message Given:  Yes     Dannette Barbara 10/11/2019, 1:39 PM

## 2019-10-12 ENCOUNTER — Telehealth: Payer: Self-pay | Admitting: Family

## 2019-10-12 NOTE — Telephone Encounter (Signed)
Unable to reach patient regarding her follow up chf clinic appointment that was made while she was inpatient.   Dietrich Samuelson, NT

## 2019-10-18 ENCOUNTER — Other Ambulatory Visit: Payer: Self-pay

## 2019-10-18 ENCOUNTER — Encounter: Payer: Self-pay | Admitting: Emergency Medicine

## 2019-10-18 ENCOUNTER — Inpatient Hospital Stay
Admission: EM | Admit: 2019-10-18 | Discharge: 2019-10-23 | DRG: 291 | Disposition: A | Discharge status: Home Health | Payer: Medicare (Managed Care) | Attending: Internal Medicine | Admitting: Internal Medicine

## 2019-10-18 ENCOUNTER — Emergency Department: Payer: Medicare (Managed Care)

## 2019-10-18 DIAGNOSIS — M1990 Unspecified osteoarthritis, unspecified site: Secondary | ICD-10-CM | POA: Diagnosis present

## 2019-10-18 DIAGNOSIS — Z9981 Dependence on supplemental oxygen: Secondary | ICD-10-CM | POA: Diagnosis not present

## 2019-10-18 DIAGNOSIS — J810 Acute pulmonary edema: Secondary | ICD-10-CM | POA: Diagnosis not present

## 2019-10-18 DIAGNOSIS — Z8249 Family history of ischemic heart disease and other diseases of the circulatory system: Secondary | ICD-10-CM

## 2019-10-18 DIAGNOSIS — Z6836 Body mass index (BMI) 36.0-36.9, adult: Secondary | ICD-10-CM

## 2019-10-18 DIAGNOSIS — Z950 Presence of cardiac pacemaker: Secondary | ICD-10-CM

## 2019-10-18 DIAGNOSIS — D631 Anemia in chronic kidney disease: Secondary | ICD-10-CM | POA: Diagnosis present

## 2019-10-18 DIAGNOSIS — Z833 Family history of diabetes mellitus: Secondary | ICD-10-CM

## 2019-10-18 DIAGNOSIS — W208XXA Other cause of strike by thrown, projected or falling object, initial encounter: Secondary | ICD-10-CM | POA: Diagnosis present

## 2019-10-18 DIAGNOSIS — E876 Hypokalemia: Secondary | ICD-10-CM | POA: Diagnosis present

## 2019-10-18 DIAGNOSIS — R52 Pain, unspecified: Secondary | ICD-10-CM

## 2019-10-18 DIAGNOSIS — E861 Hypovolemia: Secondary | ICD-10-CM | POA: Diagnosis not present

## 2019-10-18 DIAGNOSIS — N1832 Chronic kidney disease, stage 3b: Secondary | ICD-10-CM | POA: Diagnosis present

## 2019-10-18 DIAGNOSIS — D6859 Other primary thrombophilia: Secondary | ICD-10-CM | POA: Diagnosis present

## 2019-10-18 DIAGNOSIS — D519 Vitamin B12 deficiency anemia, unspecified: Secondary | ICD-10-CM | POA: Diagnosis not present

## 2019-10-18 DIAGNOSIS — K921 Melena: Secondary | ICD-10-CM | POA: Diagnosis present

## 2019-10-18 DIAGNOSIS — N183 Chronic kidney disease, stage 3 unspecified: Secondary | ICD-10-CM | POA: Diagnosis present

## 2019-10-18 DIAGNOSIS — Z8261 Family history of arthritis: Secondary | ICD-10-CM | POA: Diagnosis not present

## 2019-10-18 DIAGNOSIS — Z66 Do not resuscitate: Secondary | ICD-10-CM | POA: Diagnosis present

## 2019-10-18 DIAGNOSIS — Z79899 Other long term (current) drug therapy: Secondary | ICD-10-CM

## 2019-10-18 DIAGNOSIS — M79674 Pain in right toe(s): Secondary | ICD-10-CM | POA: Diagnosis not present

## 2019-10-18 DIAGNOSIS — I5033 Acute on chronic diastolic (congestive) heart failure: Secondary | ICD-10-CM | POA: Diagnosis not present

## 2019-10-18 DIAGNOSIS — T502X5A Adverse effect of carbonic-anhydrase inhibitors, benzothiadiazides and other diuretics, initial encounter: Secondary | ICD-10-CM | POA: Diagnosis not present

## 2019-10-18 DIAGNOSIS — I255 Ischemic cardiomyopathy: Secondary | ICD-10-CM | POA: Diagnosis present

## 2019-10-18 DIAGNOSIS — E1122 Type 2 diabetes mellitus with diabetic chronic kidney disease: Secondary | ICD-10-CM | POA: Diagnosis present

## 2019-10-18 DIAGNOSIS — I130 Hypertensive heart and chronic kidney disease with heart failure and stage 1 through stage 4 chronic kidney disease, or unspecified chronic kidney disease: Secondary | ICD-10-CM | POA: Diagnosis present

## 2019-10-18 DIAGNOSIS — N179 Acute kidney failure, unspecified: Secondary | ICD-10-CM | POA: Diagnosis not present

## 2019-10-18 DIAGNOSIS — I951 Orthostatic hypotension: Secondary | ICD-10-CM | POA: Diagnosis not present

## 2019-10-18 DIAGNOSIS — Z20822 Contact with and (suspected) exposure to covid-19: Secondary | ICD-10-CM | POA: Diagnosis present

## 2019-10-18 DIAGNOSIS — Z87891 Personal history of nicotine dependence: Secondary | ICD-10-CM

## 2019-10-18 DIAGNOSIS — D500 Iron deficiency anemia secondary to blood loss (chronic): Secondary | ICD-10-CM | POA: Diagnosis not present

## 2019-10-18 DIAGNOSIS — I509 Heart failure, unspecified: Secondary | ICD-10-CM

## 2019-10-18 DIAGNOSIS — Y92010 Kitchen of single-family (private) house as the place of occurrence of the external cause: Secondary | ICD-10-CM | POA: Diagnosis not present

## 2019-10-18 DIAGNOSIS — E1169 Type 2 diabetes mellitus with other specified complication: Secondary | ICD-10-CM | POA: Diagnosis present

## 2019-10-18 DIAGNOSIS — M5410 Radiculopathy, site unspecified: Secondary | ICD-10-CM | POA: Diagnosis present

## 2019-10-18 DIAGNOSIS — K5730 Diverticulosis of large intestine without perforation or abscess without bleeding: Secondary | ICD-10-CM | POA: Diagnosis present

## 2019-10-18 DIAGNOSIS — R627 Adult failure to thrive: Secondary | ICD-10-CM | POA: Diagnosis present

## 2019-10-18 DIAGNOSIS — J9611 Chronic respiratory failure with hypoxia: Secondary | ICD-10-CM | POA: Diagnosis present

## 2019-10-18 DIAGNOSIS — J449 Chronic obstructive pulmonary disease, unspecified: Secondary | ICD-10-CM | POA: Diagnosis not present

## 2019-10-18 DIAGNOSIS — D509 Iron deficiency anemia, unspecified: Secondary | ICD-10-CM | POA: Diagnosis present

## 2019-10-18 DIAGNOSIS — E785 Hyperlipidemia, unspecified: Secondary | ICD-10-CM | POA: Diagnosis present

## 2019-10-18 DIAGNOSIS — I5043 Acute on chronic combined systolic (congestive) and diastolic (congestive) heart failure: Secondary | ICD-10-CM | POA: Diagnosis present

## 2019-10-18 DIAGNOSIS — Z955 Presence of coronary angioplasty implant and graft: Secondary | ICD-10-CM

## 2019-10-18 DIAGNOSIS — E538 Deficiency of other specified B group vitamins: Secondary | ICD-10-CM | POA: Diagnosis present

## 2019-10-18 DIAGNOSIS — Z882 Allergy status to sulfonamides status: Secondary | ICD-10-CM

## 2019-10-18 DIAGNOSIS — N189 Chronic kidney disease, unspecified: Secondary | ICD-10-CM | POA: Diagnosis present

## 2019-10-18 DIAGNOSIS — E8809 Other disorders of plasma-protein metabolism, not elsewhere classified: Secondary | ICD-10-CM | POA: Diagnosis present

## 2019-10-18 DIAGNOSIS — K219 Gastro-esophageal reflux disease without esophagitis: Secondary | ICD-10-CM | POA: Diagnosis present

## 2019-10-18 DIAGNOSIS — I5023 Acute on chronic systolic (congestive) heart failure: Secondary | ICD-10-CM | POA: Diagnosis present

## 2019-10-18 DIAGNOSIS — I2510 Atherosclerotic heart disease of native coronary artery without angina pectoris: Secondary | ICD-10-CM | POA: Diagnosis present

## 2019-10-18 DIAGNOSIS — E669 Obesity, unspecified: Secondary | ICD-10-CM | POA: Diagnosis not present

## 2019-10-18 DIAGNOSIS — I495 Sick sinus syndrome: Secondary | ICD-10-CM | POA: Diagnosis present

## 2019-10-18 DIAGNOSIS — I252 Old myocardial infarction: Secondary | ICD-10-CM

## 2019-10-18 DIAGNOSIS — K644 Residual hemorrhoidal skin tags: Secondary | ICD-10-CM | POA: Diagnosis present

## 2019-10-18 DIAGNOSIS — F419 Anxiety disorder, unspecified: Secondary | ICD-10-CM | POA: Diagnosis present

## 2019-10-18 DIAGNOSIS — I4819 Other persistent atrial fibrillation: Secondary | ICD-10-CM | POA: Diagnosis present

## 2019-10-18 DIAGNOSIS — E039 Hypothyroidism, unspecified: Secondary | ICD-10-CM | POA: Diagnosis present

## 2019-10-18 DIAGNOSIS — E860 Dehydration: Secondary | ICD-10-CM | POA: Diagnosis not present

## 2019-10-18 DIAGNOSIS — Z96653 Presence of artificial knee joint, bilateral: Secondary | ICD-10-CM | POA: Diagnosis present

## 2019-10-18 DIAGNOSIS — R0602 Shortness of breath: Secondary | ICD-10-CM | POA: Diagnosis present

## 2019-10-18 DIAGNOSIS — I4821 Permanent atrial fibrillation: Secondary | ICD-10-CM | POA: Diagnosis present

## 2019-10-18 DIAGNOSIS — K635 Polyp of colon: Secondary | ICD-10-CM | POA: Diagnosis present

## 2019-10-18 DIAGNOSIS — Z7982 Long term (current) use of aspirin: Secondary | ICD-10-CM

## 2019-10-18 DIAGNOSIS — K449 Diaphragmatic hernia without obstruction or gangrene: Secondary | ICD-10-CM | POA: Diagnosis present

## 2019-10-18 DIAGNOSIS — Z9111 Patient's noncompliance with dietary regimen: Secondary | ICD-10-CM

## 2019-10-18 DIAGNOSIS — R7989 Other specified abnormal findings of blood chemistry: Secondary | ICD-10-CM

## 2019-10-18 DIAGNOSIS — Z880 Allergy status to penicillin: Secondary | ICD-10-CM

## 2019-10-18 HISTORY — DX: Chronic kidney disease, unspecified: N18.9

## 2019-10-18 HISTORY — DX: Presence of cardiac pacemaker: Z95.0

## 2019-10-18 HISTORY — DX: Anemia, unspecified: D64.9

## 2019-10-18 HISTORY — DX: Cardiac arrhythmia, unspecified: I49.9

## 2019-10-18 LAB — COMPREHENSIVE METABOLIC PANEL
ALT: 19 U/L (ref 0–44)
AST: 20 U/L (ref 15–41)
Albumin: 3.1 g/dL — ABNORMAL LOW (ref 3.5–5.0)
Alkaline Phosphatase: 57 U/L (ref 38–126)
Anion gap: 10 (ref 5–15)
BUN: 42 mg/dL — ABNORMAL HIGH (ref 8–23)
CO2: 30 mmol/L (ref 22–32)
Calcium: 9.6 mg/dL (ref 8.9–10.3)
Chloride: 105 mmol/L (ref 98–111)
Creatinine, Ser: 1.49 mg/dL — ABNORMAL HIGH (ref 0.44–1.00)
GFR calc Af Amer: 36 mL/min — ABNORMAL LOW (ref >60)
GFR calc non Af Amer: 31 mL/min — ABNORMAL LOW (ref >60)
Glucose, Bld: 141 mg/dL — ABNORMAL HIGH (ref 70–99)
Potassium: 3.5 mmol/L (ref 3.5–5.1)
Sodium: 145 mmol/L (ref 135–145)
Total Bilirubin: 0.6 mg/dL (ref 0.3–1.2)
Total Protein: 5.8 g/dL — ABNORMAL LOW (ref 6.5–8.1)

## 2019-10-18 LAB — CBC WITH DIFFERENTIAL/PLATELET
Abs Immature Granulocytes: 0.02 10*3/uL (ref 0.00–0.07)
Basophils Absolute: 0 10*3/uL (ref 0.0–0.1)
Basophils Relative: 1 %
Eosinophils Absolute: 0.2 10*3/uL (ref 0.0–0.5)
Eosinophils Relative: 3 %
HCT: 27.5 % — ABNORMAL LOW (ref 36.0–46.0)
Hemoglobin: 8.7 g/dL — ABNORMAL LOW (ref 12.0–15.0)
Immature Granulocytes: 0 %
Lymphocytes Relative: 21 %
Lymphs Abs: 1.3 10*3/uL (ref 0.7–4.0)
MCH: 29.7 pg (ref 26.0–34.0)
MCHC: 31.6 g/dL (ref 30.0–36.0)
MCV: 93.9 fL (ref 80.0–100.0)
Monocytes Absolute: 0.7 10*3/uL (ref 0.1–1.0)
Monocytes Relative: 11 %
Neutro Abs: 4.1 10*3/uL (ref 1.7–7.7)
Neutrophils Relative %: 64 %
Platelets: 293 10*3/uL (ref 150–400)
RBC: 2.93 MIL/uL — ABNORMAL LOW (ref 3.87–5.11)
RDW: 16.6 % — ABNORMAL HIGH (ref 11.5–15.5)
WBC: 6.3 10*3/uL (ref 4.0–10.5)
nRBC: 0 % (ref 0.0–0.2)

## 2019-10-18 LAB — MAGNESIUM: Magnesium: 1.9 mg/dL (ref 1.7–2.4)

## 2019-10-18 LAB — BRAIN NATRIURETIC PEPTIDE: B Natriuretic Peptide: 377.8 pg/mL — ABNORMAL HIGH (ref 0.0–100.0)

## 2019-10-18 LAB — SARS CORONAVIRUS 2 BY RT PCR (HOSPITAL ORDER, PERFORMED IN ~~LOC~~ HOSPITAL LAB): SARS Coronavirus 2: NEGATIVE

## 2019-10-18 LAB — TROPONIN I (HIGH SENSITIVITY): Troponin I (High Sensitivity): 16 ng/L (ref <18)

## 2019-10-18 LAB — PROCALCITONIN: Procalcitonin: <0.1 ng/mL

## 2019-10-18 MED ORDER — METOPROLOL TARTRATE 25 MG PO TABS
25.0000 mg | ORAL_TABLET | Freq: Two times a day (BID) | ORAL | Status: DC
  Administered 2019-10-18 – 2019-10-20 (×5): 25 mg via ORAL
  Filled 2019-10-18 (×6): qty 1

## 2019-10-18 MED ORDER — SODIUM CHLORIDE 0.9% FLUSH
3.0000 mL | INTRAVENOUS | Status: DC | PRN
  Administered 2019-10-21: 3 mL via INTRAVENOUS

## 2019-10-18 MED ORDER — IPRATROPIUM-ALBUTEROL 0.5-2.5 (3) MG/3ML IN SOLN: 3.0000 mL | RESPIRATORY_TRACT | Status: DC | PRN

## 2019-10-18 MED ORDER — SODIUM CHLORIDE 0.9 % IV SOLN
250.0000 mL | INTRAVENOUS | Status: DC | PRN
  Administered 2019-10-22: 250 mL via INTRAVENOUS

## 2019-10-18 MED ORDER — ACETAMINOPHEN 325 MG PO TABS
650.0000 mg | ORAL_TABLET | ORAL | Status: DC | PRN
  Administered 2019-10-19: 650 mg via ORAL
  Filled 2019-10-18: qty 2

## 2019-10-18 MED ORDER — ASPIRIN EC 81 MG PO TBEC: 81.0000 mg | DELAYED_RELEASE_TABLET | Freq: Every day | ORAL | Status: DC

## 2019-10-18 MED ORDER — SODIUM CHLORIDE 0.9% FLUSH
3.0000 mL | Freq: Two times a day (BID) | INTRAVENOUS | Status: DC
  Administered 2019-10-19 – 2019-10-23 (×7): 3 mL via INTRAVENOUS

## 2019-10-18 MED ORDER — TRAZODONE HCL 100 MG PO TABS
100.0000 mg | ORAL_TABLET | Freq: Every day | ORAL | Status: DC
  Administered 2019-10-19 – 2019-10-22 (×3): 100 mg via ORAL
  Filled 2019-10-18 (×5): qty 1

## 2019-10-18 MED ORDER — SACUBITRIL-VALSARTAN 24-26 MG PO TABS
1.0000 | ORAL_TABLET | Freq: Two times a day (BID) | ORAL | Status: DC
  Administered 2019-10-18 – 2019-10-19 (×2): 1 via ORAL
  Filled 2019-10-18 (×4): qty 1

## 2019-10-18 MED ORDER — FUROSEMIDE 10 MG/ML IJ SOLN
40.0000 mg | Freq: Two times a day (BID) | INTRAMUSCULAR | Status: AC
  Administered 2019-10-19 – 2019-10-20 (×3): 40 mg via INTRAVENOUS
  Filled 2019-10-18 (×3): qty 4

## 2019-10-18 MED ORDER — ONDANSETRON HCL 4 MG/2ML IJ SOLN: 4.0000 mg | Freq: Four times a day (QID) | INTRAMUSCULAR | Status: DC | PRN

## 2019-10-18 MED ORDER — FUROSEMIDE 10 MG/ML IJ SOLN
40.0000 mg | Freq: Once | INTRAMUSCULAR | Status: AC
  Administered 2019-10-18: 40 mg via INTRAVENOUS
  Filled 2019-10-18: qty 4

## 2019-10-18 MED ORDER — ASPIRIN EC 81 MG PO TBEC
81.0000 mg | DELAYED_RELEASE_TABLET | Freq: Every day | ORAL | Status: DC
  Administered 2019-10-19 – 2019-10-23 (×5): 81 mg via ORAL
  Filled 2019-10-18 (×6): qty 1

## 2019-10-18 MED ORDER — DULOXETINE HCL 30 MG PO CPEP
30.0000 mg | ORAL_CAPSULE | Freq: Every day | ORAL | Status: DC
  Administered 2019-10-18 – 2019-10-23 (×6): 30 mg via ORAL
  Filled 2019-10-18 (×7): qty 1

## 2019-10-18 MED ORDER — ROSUVASTATIN CALCIUM 10 MG PO TABS
10.0000 mg | ORAL_TABLET | Freq: Every day | ORAL | Status: DC
  Administered 2019-10-20 – 2019-10-23 (×4): 10 mg via ORAL
  Filled 2019-10-18 (×2): qty 1
  Filled 2019-10-18 (×3): qty 0.5
  Filled 2019-10-18 (×2): qty 1

## 2019-10-18 MED ORDER — AMIODARONE HCL 100 MG PO TABS
100.0000 mg | ORAL_TABLET | Freq: Every day | ORAL | Status: DC
  Administered 2019-10-18 – 2019-10-19 (×2): 100 mg via ORAL
  Filled 2019-10-18 (×2): qty 1

## 2019-10-18 NOTE — H&P (Addendum)
History and Physical    Evelyn Simmons HTD:[id number] DOB: [birth year] DOA: 10/18/2019  PCP: Dionicia Abler, NP   Patient coming from:  Home  Chief Complaint: SOB  HPI: Evelyn Simmons is a 86 y.o. female with medical history significant for  COPD and chronic respiratory failure on home O2 at 2-3 L/min, DM , systolic and diastolic CHF, atrial fibrillation not anti-coagualted secondary to GI bleed,  history of sick sinus syndrome s/p  pacemaker, HTN, HDL, and hypothyroidism who presents for assessment of 2 days of worsening cough and shortness of breath associated with phlegm production.  Breath is worsened when she tries to ambulate. Patient states she feels like she has phlegm stuck in her throat that she cannot get out although she has been able to eat and drink normally.  She denies having any chest pain or pressure and has not had any palpitations. She denies any fevers, chills, headache, earache, pain in her throat or trouble swallowing, blood in her sputum, diarrhea, dysuria.  No clear alleviating or aggravating factors.  She was admitted a few weeks ago for similar symptoms and CHF exacerbation. Patient states that she usually wears 2 L of oxygen last couple days but has been on 3 since last night.  She reports that this afternoon she went to getting from her refrigerator when a block of cheese that weighed 2 pounds fell out and hit her on the right first toe and she has had pain in the toe and difficulty walking since then.  Her first right toe is swollen and bruised and colored purple now. Patient lives alone.  She has a 30-pack-year history of smoking but quit 30 years ago.  Nuys alcohol or illicit drug use  ED Course: In the emergency room Ms. Mclaine is found to have pulmonary vascular congestion with elevated BNP.  She does not have a fever or elevated WBC.  No sign of infiltrate or consolidation suggestive of pneumonia on chest x-ray.  Hospitalist service has been asked to admit for further  management for CHF exacerbation  Review of Systems:  General: Reports generalized weakness past few days.  Denies fever, chills, weight loss, night sweats.  Denies dizziness.  Denies change in appetite HENT: Denies head trauma, headache, denies change in hearing, tinnitus.  Denies nasal congestion or bleeding.  Denies sore throat, sores in mouth.  Denies difficulty swallowing Eyes: Denies blurry vision, pain in eye, drainage.  Denies discoloration of eyes. Neck: Denies pain.  Denies swelling.  Denies pain with movement. Cardiovascular: Denies chest pain, palpitations.  Reports leg edema.  Ports orthopnea Respiratory: Reports shortness of breath worsened with exertion.  Has chronic dry cough.  Reports intermittent wheezing denies sputum production Gastrointestinal: Reports chronic lower abdominal pain.  Denies swelling.  Denies nausea, vomiting, diarrhea.  Reports melena.  Denies hematemesis. Musculoskeletal: Denies limitation of movement.  Reports pain of right first toe with bruising of toe.  Ports chronic arthralgias, denies myalgias. Genitourinary: Denies pelvic pain.  Denies urinary frequency or hesitancy.  Denies dysuria.  Skin: Denies rash.  Denies petechiae, purpura, ecchymosis. Neurological: Denies headache.  Denies syncope.  Denies seizure activity.  Denies weakness or paresthesia.  Denies slurred speech, drooping face.  Denies visual change. Psychiatric: Denies depression, anxiety.  Denies suicidal thoughts or ideation.  Denies hallucinations.  Past Medical History:  Diagnosis Date  . (HFmrEF) heart failure with mid-range ejection fraction ([place])    a. 09/2019 Echo: EF 45-50%, glob HK, mild conc LVH, mildly elev PASP. Sev dil LA.  Mildly dil RA. Mild MR.  Marland Kitchen Anemia   . Arthritis   . Asthma   . CAD (coronary artery disease)    a. 1995 s/p PCI/BMS LAD; b. 2011 s/p PCI/DES RCA.  Marland Kitchen CHF (congestive heart failure) ([place])   . Chronic kidney disease   . COPD (chronic obstructive pulmonary  disease) ([place])   . Depression   . Diabetes mellitus without complication ([place])   . Diverticulitis   . Dysrhythmia   . GERD (gastroesophageal reflux disease)   . Hyperlipidemia   . Hypertension   . Hypothyroid    pt unsure, not currently on medication  . Ischemic cardiomyopathy   . MI (myocardial infarction) ([place])    1995  . Persistent atrial fibrillation ([place])    a. No OAC 2/2 h/o bleeding/anemia; b. Longterm amio therapy; c. 08/2019 - recurrent afib-->rate-controlled.  . Presence of permanent cardiac pacemaker   . Radiculopathy     Past Surgical History:  Procedure Laterality Date  . ABDOMINAL HYSTERECTOMY    . APPENDECTOMY    . CARDIAC CATHETERIZATION    . CORONARY ANGIOPLASTY WITH STENT PLACEMENT  12/11/1993   RCA  . FOOT SURGERY Bilateral    Over 25 years ago- Bunionectomy  . INSERT / REPLACE / REMOVE PACEMAKER  09/2012   Model # K876  serial # [id number]  . JOINT REPLACEMENT     bilateral knee replacements    Social History  reports that she quit smoking about 25 years ago. She has quit using smokeless tobacco. She reports current alcohol use. She reports that she does not use drugs.  Allergies  Allergen Reactions  . Penicillins Nausea And Vomiting, Rash and Hives  . Other Other (See Comments)  . Sulfa Antibiotics     Pt unsure reaction, was told in hospital she had allergy  . Penicillin G Rash    Family History  Problem Relation Age of Onset  . Arthritis Mother   . Heart disease Mother   . Hypertension Mother   . Diabetes Mother   . Arthritis Father   . Heart disease Father   . Hypertension Father   . Cancer Sister        breast cancer  . Diabetes Brother   . Cancer Daughter        lung cancer  . Diabetes Sister      Prior to Admission medications   Medication Sig Start Date End Date Taking? Authorizing Provider  albuterol (PROVENTIL HFA;VENTOLIN HFA) 108 (90 Base) MCG/ACT inhaler Inhale 2 puffs into the lungs every 6 (six) hours as needed for  wheezing or shortness of breath.    [provider]  amiodarone (PACERONE) 100 MG tablet Take 1 tablet (100 mg total) by mouth daily. 08/12/17   Deboraha Sprang, MD  aspirin EC 81 MG tablet Take 81 mg by mouth daily.    [provider]  Blood Glucose Monitoring Suppl (ACCU-CHEK NANO SMARTVIEW) W/DEVICE KIT Please issue nano meter by AccuChek along with test strips at checking frequency of 2x daily. 90 day supply. E11.9 05/13/14   Phadke, Karsten Ro, MD  cholecalciferol (VITAMIN D) 1000 units tablet Take 1,000 Units by mouth daily.    [provider]  cyclobenzaprine (FLEXERIL) 5 MG tablet Take 5 mg by mouth 3 (three) times daily as needed for muscle spasms.    [provider]  DULoxetine (CYMBALTA) 30 MG capsule Take 30 mg by mouth daily.    [provider]  ferrous sulfate 325 (65  FE) MG tablet Take 1 tablet (325 mg total) by mouth 2 (two) times daily with a meal. 10/11/19   Nicole Kindred A, DO  glucose blood (FREESTYLE LITE) test strip Use as instructed 05/30/14   Rubbie Battiest, RN  ipratropium-albuterol (DUONEB) 0.5-2.5 (3) MG/3ML SOLN Take 3 mLs by nebulization.    [provider]  Lancets (FREESTYLE) lancets Use as instructed 05/30/14   Rubbie Battiest, RN  liraglutide (VICTOZA) 18 MG/3ML SOPN Inject 0.6 mg into the skin daily. 10/11/12   [provider]  metoprolol tartrate (LOPRESSOR) 25 MG tablet Take 1 tablet (25 mg total) by mouth 2 (two) times daily. 10/11/19   Nicole Kindred A, DO  pantoprazole (PROTONIX) 20 MG tablet Take 20 mg by mouth daily.    [provider]  rosuvastatin (CRESTOR) 40 MG tablet Take 1 tablet (40 mg total) by mouth daily. 05/09/14   Minna Merritts, MD  Sennosides-Docusate Sodium (SENEXON-S PO) Take by mouth. Take two tablets by mouth once daily    [provider]  torsemide (DEMADEX) 20 MG tablet Take 1 tablet (20 mg total) by mouth 2 (two) times daily. 10/11/19   Ezekiel Slocumb, DO    traZODone (DESYREL) 100 MG tablet Take 100 mg by mouth at bedtime.    [provider]  vitamin B-12 1000 MCG tablet Take 1 tablet (1,000 mcg total) by mouth daily. 10/11/19   Ezekiel Slocumb, DO    Physical Exam: Vitals:   10/18/19 1840  Weight: 79.4 kg  Height: 4' 11" (1.499 m)    Constitutional: NAD, calm, comfortable Vitals:   10/18/19 1840  Weight: 79.4 kg  Height: 4' 11" (1.499 m)   General: WDWN, Alert and oriented x3.  Eyes: EOMI, PERRL, lids and conjunctivae normal.  Sclera nonicteric HENT:  [place]/AT, external ears normal.  Nares patent without epistasis.  Mucous membranes are moist. Posterior pharynx clear of any exudate or lesions.  Dentures in place. Neck: Soft, normal range of motion, supple, no masses, no thyromegaly.  Trachea midline Respiratory: Diminished breath sounds bilaterally.  Bibasilar Rales and crackles.  Mild expiratory wheezing.  No rhonchi.  Normal respiratory effort. No accessory muscle use.  Cardiovascular: Irregularly paced rhythm with normal rate.  2/6 systolic murmur.  No rubs / gallops.  +1 lower extremity edema. 1+ pedal pulses. Abdomen: Soft, mild tenderness to palpation lower abdomen mostly on left side.  Tenderness, nondistended, no rebound or guarding.  Obese.  No masses palpated. Bowel sounds normoactive Musculoskeletal: FROM.  First toe swollen with ecchymosis and painful to touch.  No obvious deformity.  No clubbing / cyanosis.  Normal muscle tone.  Skin: Warm, dry, intact no rashes, lesions, ulcers. No induration.  Phimosis of right first toe Neurologic: CN 2-12 grossly intact.  Normal speech.  Sensation intact, patella DTR +1 bilaterally. Strength 4/5 in all extremities.   Psychiatric: Normal judgment and insight.  Normal mood.    Labs on Admission: I have personally reviewed following labs and imaging studies  CBC: Recent Labs  Lab 10/18/19 1843  WBC 6.3  NEUTROABS 4.1  HGB 8.7*  HCT 27.5*  MCV 93.9  PLT 293    Basic  Metabolic Panel: Recent Labs  Lab 10/18/19 1843  NA 145  K 3.5  CL 105  CO2 30  GLUCOSE 141*  BUN 42*  CREATININE 1.49*  CALCIUM 9.6  MG 1.9    GFR: Estimated Creatinine Clearance: 23.8 mL/min (A) (by C-G formula based on SCr of 1.49  mg/dL (H)).  Liver Function Tests: Recent Labs  Lab 10/18/19 1843  AST 20  ALT 19  ALKPHOS 57  BILITOT 0.6  PROT 5.8*  ALBUMIN 3.1*    Urine analysis:    Component Value Date/Time   COLORURINE YELLOW (A) 10/07/2019 1155   APPEARANCEUR CLOUDY (A) 10/07/2019 1155   LABSPEC 1.012 10/07/2019 1155   PHURINE 5.0 10/07/2019 1155   GLUCOSEU NEGATIVE 10/07/2019 1155   HGBUR SMALL (A) 10/07/2019 1155   BILIRUBINUR NEGATIVE 10/07/2019 [place] 10/07/2019 1155   PROTEINUR NEGATIVE 10/07/2019 1155   NITRITE NEGATIVE 10/07/2019 1155   LEUKOCYTESUR LARGE (A) 10/07/2019 1155    Radiological Exams on Admission: DG Chest 1 View  Result Date: 10/18/2019 CLINICAL DATA:  Pt presents via acems with c/o shortness of breath and congestion. Pt has hx of CHF. Pt chronically wears 2L at home. Pt respirations currently even and unlabored EXAM: CHEST  1 VIEW COMPARISON:  10/04/2019 FINDINGS: Cardiac silhouette is mildly enlarged. Stable left anterior chest wall dual lead pacemaker. No mediastinal or hilar masses. Vascular congestion with mild interstitial prominence similar to the prior exam. Possible small pleural effusions. No lung consolidation to suggest pneumonia. No pneumothorax. Skeletal structures are demineralized but grossly intact. IMPRESSION: 1. Cardiomegaly, vascular congestion and interstitial prominence, with probable small effusions, similar to the prior study. Suspect mild congestive heart failure. No evidence of pneumonia. Electronically Signed   By: Lajean Manes M.D.   On: 10/18/2019 18:58   DG Foot 2 Views Right  Result Date: 10/18/2019 CLINICAL DATA:  Pain and discoloration to the right great toe. Patient had a frozen pack of  cheese fall on it. EXAM: RIGHT FOOT - 2 VIEW COMPARISON:  None. FINDINGS: No fracture. There is flattening of the medial aspect of the distal first metatarsal and the metatarsal head consistent with previous bunion surgery. The first metatarsophalangeal joint show significant narrowing with subchondral sclerosis. The base of the proximal phalanx of the great toe is narrowed which may be from erosions or from previous surgery. IP joint is normally aligned. Remaining joints are normally aligned. Bones are demineralized. There is soft tissue swelling of the great toe and of the forefoot most evident dorsally. Arterial vascular calcifications extend from the ankle to the distal foot and toes. IMPRESSION: 1. No fracture or dislocation. 2. Advanced arthropathic changes at the first metatarsophalangeal joint, most likely osteoarthritis. There are postsurgical changes consistent with a previous bunionectomy. 3. Great toe and forefoot soft tissue swelling. Electronically Signed   By: Lajean Manes M.D.   On: 10/18/2019 20:24    EKG: Independently reviewed.  EKG is reviewed.  Paced rhythm.  No acute ST elevation or depression.  QTc 468 Assessment/Plan Principal Problem:   Acute on chronic systolic CHF (congestive heart failure) ([place]) Ms. Peltz will be admitted to cardiac telemetry floor. Be diuresed with Lasix 40 mg IV twice daily for the next few days.  Monitor INO's.  Monitor daily weight. Continue home medications of metoprolol.  Add low-dose Entresto for further treatment of her heart failure. Patient had echocardiogram on September 18, 2019 which showed decreased LV function with an EF of 45 to 50% with global hypokinesis and mildly decreased right ventricular function.  She also had dilated left atrium and right atrium. Check serial troponin levels.  Active Problems:   CKD stage 3 due to type 2 diabetes mellitus (HCC) Stable chronic kidney disease.  Will monitor electrolytes and renal function labs in  morning  CAD (coronary artery disease) Continue cardiovascular medications that she takes at home.  Low-dose enteric-coated aspirin daily    COPD (chronic obstructive pulmonary disease) (HCC)  DuoNeb every 4 hours as needed for cough, shortness of breath, wheeze.  Continue supplemental oxygen by nasal cannula.  Patient uses nasal oxygen at home.    Persistent atrial fibrillation (HCC) Stable.  Patient has pacemaker.    Diabetes mellitus type 2 in obese [facility]) Home medications will be verified and reconciled by pharmacy and then resumed. Patient had hemoglobin A1c last month that was 6.9    Anemia due to chronic kidney disease Chronic mild anemia at baseline level. Check hemoccult of three stool samples    Great toe pain, right Patient with injury to her first right toe this afternoon when a block of cheese fell out of the refrigerator and hit her directly in the toe.  Her toe was swollen and bruised.  Obtain x-ray and will treat as indicated    DVT prophylaxis: Padua score elevated. SCDs for DVT prophylaxis. Pt has hx of GI bleed and currently reports melana.  Code Status:   DNR  Family Communication:  Diagnosis and plan discussed with patient.  Patient verbalized understanding agrees with plan.  Further recommendations to follow as clinical indicated Disposition Plan:   Patient is from:  Home  Anticipated DC to:  Home  Anticipated DC date:  Anticipate greater than 2 midnight stay in the hospital to treat acute medical condition  Anticipated DC barriers: No barriers to discharge identified at this time   Admission status: Inpatient  Severity of Illness: The appropriate patient status for this patient is INPATIENT. Inpatient status is judged to be reasonable and necessary in order to provide the required intensity of service to ensure the patient's safety. The patient's presenting symptoms, physical exam findings, and initial radiographic and laboratory data in the context of their  chronic comorbidities is felt to place them at high risk for further clinical deterioration. Furthermore, it is not anticipated that the patient will be medically stable for discharge from the hospital within 2 midnights of admission. The following factors support the patient status of inpatient.    * I certify that at the point of admission it is my clinical judgment that the patient will require inpatient hospital care spanning beyond 2 midnights from the point of admission due to high intensity of service, high risk for further deterioration and high frequency of surveillance required.Yevonne Aline Nycole Kawahara MD [facility]  How to contact the [facility] Attending or Consulting provider [place] or covering provider during after hours [place], for this patient?   1. Check the care team in [facility] and look for a) attending/consulting [facility] provider listed and b) the [facility] team listed 2. Log into [email or web address] and use [place]'s universal password to access. If you do not have the password, please contact the hospital operator. 3. Locate the [facility] provider you are looking for under [facility] and page to a number that you can be directly reached. 4. If you still have difficulty reaching the provider, please page the [facility] (Director on Call) for the Hospitalists listed on amion for assistance.  10/18/2019, 8:36 PM

## 2019-10-18 NOTE — ED Notes (Signed)
Pt given warm blankets per request.

## 2019-10-18 NOTE — ED Triage Notes (Signed)
Pt presents via acems with c/o shortness of breath and congestion. Pt has hx of CHF. Pt chronically wears 2L at home. Pt respirations currently even and unlabored

## 2019-10-18 NOTE — ED Provider Notes (Signed)
[facility] Emergency Department Provider Note  ____________________________________________   First MD Initiated Contact with Patient 10/18/19 1841     (approximate)  I have reviewed the triage vital signs and the nursing notes.   HISTORY  Chief Complaint Shortness of Breath   HPI Evelyn Simmons is a 86 y.o. female with a PMH  of COPD and chronic respiratory failure on home O2 at 2-3 L/min, DM , systolic and diastolic CHF, atrial fibrillation not anti-coagualted 2/2 GI bleed, sick sinus syndrome s/p  pacemaker, HTN, HDL, and hypothyroidism who presents for assessment of 1 to 2 days of worsening cough and shortness of breath associated with phlegm production.  Patient states she feels like she has phlegm stuck in her throat that she cannot get out.  She denies any fevers, chills, headache, earache, pain in her throat or trouble swallowing, chest pain, blood in her sputum, diarrhea, dysuria, abdominal pain, extremity pain, rash, acute complaints.  No clear alleviating aggravating factors.  No prior similar episodes.  Patient states that she usually wears 2 L of oxygen last couple days but has been on 3 since last night.     Past Medical History:  Diagnosis Date  . (HFmrEF) heart failure with mid-range ejection fraction ([place])    a. 09/2019 Echo: EF 45-50%, glob HK, mild conc LVH, mildly elev PASP. Sev dil LA. Mildly dil RA. Mild MR.  . Arthritis   . Asthma   . CAD (coronary artery disease)    a. 1995 s/p PCI/BMS LAD; b. 2011 s/p PCI/DES RCA.  Marland Kitchen CHF (congestive heart failure) ([place])   . COPD (chronic obstructive pulmonary disease) ([place])   . Depression   . Diabetes mellitus without complication ([place])   . Diverticulitis   . GERD (gastroesophageal reflux disease)   . Hyperlipidemia   . Hypertension   . Hypothyroid    pt unsure, not currently on medication  . Ischemic cardiomyopathy   . MI (myocardial infarction) ([place])    1995  . Persistent atrial  fibrillation ([place])    a. No OAC 2/2 h/o bleeding/anemia; b. Longterm amio therapy; c. 08/2019 - recurrent afib-->rate-controlled.  . Radiculopathy     Patient Active Problem List   Diagnosis Date Noted  . Persistent atrial fibrillation ([place])   . Acute CHF (congestive heart failure) ([place]) 10/04/2019  . Chronic respiratory failure with hypoxia ([place])   . CHF (congestive heart failure) ([place]) 09/18/2019  . Atrial fibrillation with rapid ventricular response ([place]) 09/17/2019  . Acute diastolic CHF (congestive heart failure) ([place]) 09/17/2019  . Mobility impaired 05/29/2014  . CAD (coronary artery disease) 05/09/2014  . History of coronary artery stent placement 05/09/2014  . Hyperlipidemia 05/09/2014  . Bilateral low back pain with left-sided sciatica 04/21/2014  . Knee effusion 04/21/2014  . CKD stage 3 due to type 2 diabetes mellitus ([place]) 04/21/2014  . Pacemaker 04/21/2014  . Encounter to establish care 04/20/2014    Past Surgical History:  Procedure Laterality Date  . ABDOMINAL HYSTERECTOMY    . APPENDECTOMY    . CARDIAC CATHETERIZATION    . CORONARY ANGIOPLASTY WITH STENT PLACEMENT  12/11/1993   RCA  . FOOT SURGERY Bilateral    Over 25 years ago- Bunionectomy  . INSERT / REPLACE / REMOVE PACEMAKER  09/2012   Model # M196  serial # [id number]  . JOINT REPLACEMENT     bilateral knee replacements    Prior to Admission medications   Medication Sig Start Date End Date Taking? Authorizing  Provider  albuterol (PROVENTIL HFA;VENTOLIN HFA) 108 (90 Base) MCG/ACT inhaler Inhale 2 puffs into the lungs every 6 (six) hours as needed for wheezing or shortness of breath.    [provider]  amiodarone (PACERONE) 100 MG tablet Take 1 tablet (100 mg total) by mouth daily. 08/12/17   Deboraha Sprang, MD  aspirin EC 81 MG tablet Take 81 mg by mouth daily.    [provider]  Blood Glucose Monitoring Suppl (ACCU-CHEK NANO SMARTVIEW) W/DEVICE KIT Please issue nano meter by AccuChek  along with test strips at checking frequency of 2x daily. 90 day supply. E11.9 05/13/14   Phadke, Karsten Ro, MD  cholecalciferol (VITAMIN D) 1000 units tablet Take 1,000 Units by mouth daily.    [provider]  cyclobenzaprine (FLEXERIL) 5 MG tablet Take 5 mg by mouth 3 (three) times daily as needed for muscle spasms.    [provider]  DULoxetine (CYMBALTA) 30 MG capsule Take 30 mg by mouth daily.    [provider]  ferrous sulfate 325 (65 FE) MG tablet Take 1 tablet (325 mg total) by mouth 2 (two) times daily with a meal. 10/11/19   Nicole Kindred A, DO  glucose blood (FREESTYLE LITE) test strip Use as instructed 05/30/14   Rubbie Battiest, RN  ipratropium-albuterol (DUONEB) 0.5-2.5 (3) MG/3ML SOLN Take 3 mLs by nebulization.    [provider]  Lancets (FREESTYLE) lancets Use as instructed 05/30/14   Rubbie Battiest, RN  liraglutide (VICTOZA) 18 MG/3ML SOPN Inject 0.6 mg into the skin daily. 10/11/12   [provider]  metoprolol tartrate (LOPRESSOR) 25 MG tablet Take 1 tablet (25 mg total) by mouth 2 (two) times daily. 10/11/19   Nicole Kindred A, DO  pantoprazole (PROTONIX) 20 MG tablet Take 20 mg by mouth daily.    [provider]  rosuvastatin (CRESTOR) 40 MG tablet Take 1 tablet (40 mg total) by mouth daily. 05/09/14   Minna Merritts, MD  Sennosides-Docusate Sodium (SENEXON-S PO) Take by mouth. Take two tablets by mouth once daily    [provider]  torsemide (DEMADEX) 20 MG tablet Take 1 tablet (20 mg total) by mouth 2 (two) times daily. 10/11/19   Ezekiel Slocumb, DO  traZODone (DESYREL) 100 MG tablet Take 100 mg by mouth at bedtime.    [provider]  vitamin B-12 1000 MCG tablet Take 1 tablet (1,000 mcg total) by mouth daily. 10/11/19   Ezekiel Slocumb, DO    Allergies Penicillins, Other, Sulfa antibiotics, and Penicillin g  Family History  Problem Relation Age of Onset  . Arthritis Mother   . Heart disease  Mother   . Hypertension Mother   . Diabetes Mother   . Arthritis Father   . Heart disease Father   . Hypertension Father   . Cancer Sister        breast cancer  . Diabetes Brother   . Cancer Daughter        lung cancer  . Diabetes Sister     Social History Social History   Tobacco Use  . Smoking status: Former Smoker    Quit date: 12/10/1993    Years since quitting: 25.8  . Smokeless tobacco: Former Network engineer Use Topics  . Alcohol use: Yes    Alcohol/week: 0.0 standard drinks    Comment: Rare  . Drug use: No    Review of Systems  Review of Systems  Constitutional: Negative for chills and fever.  HENT: Positive for congestion. Negative for sore throat.   Eyes: Negative for pain.  Respiratory: Positive for cough, sputum production and shortness of breath. Negative for stridor.   Cardiovascular: Negative for chest pain.  Gastrointestinal: Negative for vomiting.  Skin: Negative for rash.  Neurological: Negative for seizures, loss of consciousness and headaches.  Psychiatric/Behavioral: Negative for suicidal ideas.  All other systems reviewed and are negative.     ____________________________________________   PHYSICAL EXAM:  VITAL SIGNS: ED Triage Vitals [10/18/19 1840]  Enc Vitals Group     BP      Pulse      Resp      Temp      Temp src      SpO2      Weight 175 lb (79.4 kg)     Height '4\' 11"'  (1.499 m)     Head Circumference      Peak Flow      Pain Score 0     Pain Loc      Pain Edu?      Excl. in [place]?    There were no vitals filed for this visit. Physical Exam Vitals and nursing note reviewed.  Constitutional:      General: She is not in acute distress.    Appearance: She is well-developed.  HENT:     Head: Normocephalic and atraumatic.     Right Ear: External ear normal.     Left Ear: External ear normal.     Nose: Nose normal.     Mouth/Throat:     Mouth: Mucous membranes are moist.  Eyes:     Conjunctiva/sclera: Conjunctivae  normal.  Cardiovascular:     Rate and Rhythm: Normal rate and regular rhythm.     Heart sounds: No murmur heard.   Pulmonary:     Effort: Pulmonary effort is normal. No respiratory distress.     Breath sounds: Rales present.  Abdominal:     Palpations: Abdomen is soft.     Tenderness: There is no abdominal tenderness.  Musculoskeletal:     Cervical back: Neck supple.     Right lower leg: Edema present.     Left lower leg: Edema present.  Skin:    General: Skin is warm and dry.     Capillary Refill: Capillary refill takes less than 2 seconds.  Neurological:     Mental Status: She is alert and oriented to person, place, and time.  Psychiatric:        Mood and Affect: Mood normal.      ____________________________________________   LABS (all labs ordered are listed, but only abnormal results are displayed)  Labs Reviewed  BRAIN NATRIURETIC PEPTIDE - Abnormal; Notable for the following components:      Result Value   B Natriuretic Peptide 377.8 (*)    All other components within normal limits  CBC WITH DIFFERENTIAL/PLATELET - Abnormal; Notable for the following components:   RBC 2.93 (*)    Hemoglobin 8.7 (*)    HCT 27.5 (*)    RDW 16.6 (*)    All other components within normal limits  COMPREHENSIVE METABOLIC PANEL - Abnormal; Notable for the following components:   Glucose, Bld 141 (*)    BUN 42 (*)    Creatinine, Ser 1.49 (*)    Total Protein 5.8 (*)    Albumin 3.1 (*)    GFR calc non Af Amer 31 (*)    GFR calc Af Amer 36 (*)  All other components within normal limits  SARS CORONAVIRUS 2 BY RT PCR (HOSPITAL ORDER, [facility])  PROCALCITONIN  MAGNESIUM  TROPONIN I (HIGH SENSITIVITY)   ____________________________________________  EKG  Intermittently atrially paced rhythm with a ventricular rate of 106, PVC, unremarkable intervals, with no other clear changes when compared to prior in several leads or other evidence of acute  ischemia. ____________________________________________  RADIOLOGY  ED MD interpretation: Cardiomegaly with bilateral mild pulmonary edema.  No focal consolidation, no thorax, or large effusion.  Official radiology report(s): DG Chest 1 View  Result Date: 10/18/2019 CLINICAL DATA:  Pt presents via acems with c/o shortness of breath and congestion. Pt has hx of CHF. Pt chronically wears 2L at home. Pt respirations currently even and unlabored EXAM: CHEST  1 VIEW COMPARISON:  10/04/2019 FINDINGS: Cardiac silhouette is mildly enlarged. Stable left anterior chest wall dual lead pacemaker. No mediastinal or hilar masses. Vascular congestion with mild interstitial prominence similar to the prior exam. Possible small pleural effusions. No lung consolidation to suggest pneumonia. No pneumothorax. Skeletal structures are demineralized but grossly intact. IMPRESSION: 1. Cardiomegaly, vascular congestion and interstitial prominence, with probable small effusions, similar to the prior study. Suspect mild congestive heart failure. No evidence of pneumonia. Electronically Signed   By: Lajean Manes M.D.   On: 10/18/2019 18:58    ____________________________________________   PROCEDURES  Procedure(s) performed (including Critical Care):  .1-3 Lead EKG Interpretation Performed by: Lucrezia Starch, MD Authorized by: Lucrezia Starch, MD     Interpretation: abnormal     ECG rate assessment: tachycardic     Rhythm: atrial fibrillation     Ectopy: none     Conduction: normal       ____________________________________________   INITIAL IMPRESSION / ASSESSMENT AND PLAN / ED COURSE        Overall patient's history, exam, and ED work-up is most consistent with likely CHF exacerbation causing her worsening shortness of breath and cough.  No focal consolidation on chest x-ray or elevation of white blood cell count or history of fever or fever in the ED to suggest an acute infectious process.  In  addition productions procalcitonin is nonelevated.  Her BNP is elevated slightly over 300 and she does have evidence of edema on her chest x-ray.  Low suspicion for ACS given nonelevated opponent patient denying any chest pain with no clear ischemia evident on ECG.  CBC shows anemia but this is at patient's baseline I would low suspicion for acute symptomatic anemia.  CMP shows patient has CKD but her creatinine is at baseline no significant metabolic derangements.  Low suspicion for other immediate life-threatening pathology at this time.  Lasix given in the ED.  We will plan admit to medicine service for further evaluation management.  ____________________________________________   FINAL CLINICAL IMPRESSION(S) / ED DIAGNOSES  Final diagnoses:  Acute on chronic congestive heart failure, unspecified heart failure type (HCC)  Elevated brain natriuretic peptide (BNP) level  Acute pulmonary edema (HCC)    Medications  furosemide (LASIX) injection 40 mg (has no administration in time range)     ED Discharge Orders    None       Note:  This document was prepared using Dragon voice recognition software and may include unintentional dictation errors.   Lucrezia Starch, MD 10/18/19 2003

## 2019-10-18 NOTE — ED Notes (Signed)
Pt placed on external catheter by this RN. Patient comfortable in bed, no further needs noted.

## 2019-10-19 ENCOUNTER — Inpatient Hospital Stay: Payer: Medicare (Managed Care)

## 2019-10-19 DIAGNOSIS — I2510 Atherosclerotic heart disease of native coronary artery without angina pectoris: Secondary | ICD-10-CM

## 2019-10-19 DIAGNOSIS — K921 Melena: Secondary | ICD-10-CM

## 2019-10-19 DIAGNOSIS — I4819 Other persistent atrial fibrillation: Secondary | ICD-10-CM

## 2019-10-19 DIAGNOSIS — I5033 Acute on chronic diastolic (congestive) heart failure: Secondary | ICD-10-CM

## 2019-10-19 DIAGNOSIS — M79674 Pain in right toe(s): Secondary | ICD-10-CM

## 2019-10-19 DIAGNOSIS — N189 Chronic kidney disease, unspecified: Secondary | ICD-10-CM

## 2019-10-19 LAB — BASIC METABOLIC PANEL
Anion gap: 7 (ref 5–15)
BUN: 37 mg/dL — ABNORMAL HIGH (ref 8–23)
CO2: 33 mmol/L — ABNORMAL HIGH (ref 22–32)
Calcium: 9.8 mg/dL (ref 8.9–10.3)
Chloride: 105 mmol/L (ref 98–111)
Creatinine, Ser: 1.32 mg/dL — ABNORMAL HIGH (ref 0.44–1.00)
GFR calc Af Amer: 42 mL/min — ABNORMAL LOW (ref >60)
GFR calc non Af Amer: 36 mL/min — ABNORMAL LOW (ref >60)
Glucose, Bld: 171 mg/dL — ABNORMAL HIGH (ref 70–99)
Potassium: 3.3 mmol/L — ABNORMAL LOW (ref 3.5–5.1)
Sodium: 145 mmol/L (ref 135–145)

## 2019-10-19 LAB — TROPONIN I (HIGH SENSITIVITY): Troponin I (High Sensitivity): 17 ng/L (ref <18)

## 2019-10-19 LAB — CBC
HCT: 27.7 % — ABNORMAL LOW (ref 36.0–46.0)
Hemoglobin: 8.4 g/dL — ABNORMAL LOW (ref 12.0–15.0)
MCH: 29.5 pg (ref 26.0–34.0)
MCHC: 30.3 g/dL (ref 30.0–36.0)
MCV: 97.2 fL (ref 80.0–100.0)
Platelets: 250 10*3/uL (ref 150–400)
RBC: 2.85 MIL/uL — ABNORMAL LOW (ref 3.87–5.11)
RDW: 16.6 % — ABNORMAL HIGH (ref 11.5–15.5)
WBC: 7.8 10*3/uL (ref 4.0–10.5)
nRBC: 0 % (ref 0.0–0.2)

## 2019-10-19 LAB — OCCULT BLOOD X 1 CARD TO LAB, STOOL
Fecal Occult Bld: POSITIVE — AB
Fecal Occult Bld: POSITIVE — AB

## 2019-10-19 LAB — MAGNESIUM: Magnesium: 1.6 mg/dL — ABNORMAL LOW (ref 1.7–2.4)

## 2019-10-19 LAB — GLUCOSE, CAPILLARY: Glucose-Capillary: 162 mg/dL — ABNORMAL HIGH (ref 70–99)

## 2019-10-19 MED ORDER — GUAIFENESIN ER 600 MG PO TB12
1200.0000 mg | ORAL_TABLET | Freq: Two times a day (BID) | ORAL | Status: DC
  Administered 2019-10-19 – 2019-10-23 (×9): 1200 mg via ORAL
  Filled 2019-10-19 (×9): qty 2

## 2019-10-19 MED ORDER — QUETIAPINE FUMARATE 25 MG PO TABS
25.0000 mg | ORAL_TABLET | Freq: Two times a day (BID) | ORAL | Status: DC
  Administered 2019-10-19 – 2019-10-23 (×8): 25 mg via ORAL
  Filled 2019-10-19 (×9): qty 1

## 2019-10-19 MED ORDER — POTASSIUM CHLORIDE CRYS ER 20 MEQ PO TBCR
40.0000 meq | EXTENDED_RELEASE_TABLET | Freq: Once | ORAL | Status: AC
  Administered 2019-10-19: 40 meq via ORAL
  Filled 2019-10-19: qty 2

## 2019-10-19 MED ORDER — INSULIN ASPART 100 UNIT/ML ~~LOC~~ SOLN
0.0000 [IU] | Freq: Three times a day (TID) | SUBCUTANEOUS | Status: DC
  Administered 2019-10-20: 1 [IU] via SUBCUTANEOUS
  Filled 2019-10-19: qty 1

## 2019-10-19 MED ORDER — MAGNESIUM SULFATE 4 GM/100ML IV SOLN
4.0000 g | Freq: Once | INTRAVENOUS | Status: AC
  Administered 2019-10-19: 4 g via INTRAVENOUS
  Filled 2019-10-19: qty 100

## 2019-10-19 MED ORDER — PANTOPRAZOLE SODIUM 40 MG IV SOLR
40.0000 mg | Freq: Two times a day (BID) | INTRAVENOUS | Status: DC
  Administered 2019-10-19 – 2019-10-21 (×4): 40 mg via INTRAVENOUS
  Filled 2019-10-19 (×4): qty 40

## 2019-10-19 NOTE — Progress Notes (Signed)
PROGRESS NOTE    Evelyn Simmons  XNT:[id number] DOB: August 31, 1929 DOA: 10/18/2019 PCP: Dionicia Abler, NP    Chief Complaint  Patient presents with  . Shortness of Breath    Brief Narrative:  HPI per Dr.Chotiner Evelyn Simmons is a 86 y.o. female with medical history significant for COPD and chronic respiratory failure on home O2 at 2-3 L/min,DM, systolic and diastolic CHF, atrial fibrillationnot anti-coagualted secondary to GI bleed, history of sick sinus syndrome s/ppacemaker, HTN, HDL,and hypothyroidismwho presents for assessment of 2 days of worsening cough and shortness of breath associated with phlegm production.  Breath is worsened when she tries to ambulate.Patient states she feels like she has phlegm stuck in her throat that she cannot get out although she has been able to eat and drink normally.  She denies having any chest pain or pressure and has not had any palpitations.She denies any fevers, chills, headache, earache, pain in her throat or trouble swallowing, blood in her sputum, diarrhea, dysuria. No clear alleviating or aggravating factors.  She was admitted a few weeks ago for similar symptoms and CHF exacerbation.Patient states that she usually wears 2 L of oxygen last couple days but has been on 3 since last night.  She reports that this afternoon she went to getting from her refrigerator when a block of cheese that weighed 2 pounds fell out and hit her on the right first toe and she has had pain in the toe and difficulty walking since then.  Her first right toe is swollen and bruised and colored purple now. Patient lives alone.  She has a 30-pack-year history of smoking but quit 30 years ago.  [place] alcohol or illicit drug use  ED Course: In the emergency room Evelyn Simmons is found to have pulmonary vascular congestion with elevated BNP.  She does not have a fever or elevated WBC.  No sign of infiltrate or consolidation suggestive of pneumonia on chest x-ray.  Hospitalist  service has been asked to admit for further management for CHF exacerbation   Assessment & Plan:   Principal Problem:   Acute on chronic systolic CHF (congestive heart failure) ([place]) Active Problems:   CKD stage 3 due to type 2 diabetes mellitus ([place])   Pacemaker   CAD (coronary artery disease)   Persistent atrial fibrillation (H[facility])   Diabetes mellitus type 2 in obese ([place])   Anemia due to chronic kidney disease   COPD (chronic obstructive pulmonary disease) (HCC)   Great toe pain, right  #1 acute on chronic systolic heart failure Patient presenting with shortness of breath with associated phlegm production, shortness of breath on exertion.  Patient states some improvement with shortness of breath with diuresis since admission.  BNP noted to be elevated, chest x-ray concerning for volume overload.  2D echo from 09/18/2019 with a EF of 45 to 50%, left ventricular global hypokinesis and mildly decreased right ventricular function.  Patient currently on beta-blocker.  Continue Lasix 40 mg IV every 12 hours.  Low-dose Entresto started.  Patient with recent hospitalization.  Will consult cardiology for further evaluation and management.  2.  Chronic kidney disease stage III/type 2 diabetes mellitus Chronic kidney function stable.  Monitor with diuresis.  Follow.  3.  Black stools/anemia Patient with complaints of black stools.  Check FOBT.  Follow H&H.  Placed on PPI.  If FOBT is positive, hemoglobin dropping may need GI input.  Follow.  4.  Abdominal discomfort Patient with some complaints of abdominal pain.  Check abdominal films.  Supportive care.  5.  Persistent atrial fibrillation status post pacemaker CHA2DS2VASC score 6. Continue beta-blocker for rate control.  Patient deemed not a anticoagulation candidate due to prior history of GI bleed and anemia.  Patient with increased risk of bleeding with history of falls.  Patient with complaints of melanotic stools.  FOBT pending.  Follow.   Cardiology consultation pending.  6.  Hypokalemia/hypomagnesemia Replete.  7.  Hypertension Fogginess continue Metroprolol.  Entresto started.  8.  Hyperlipidemia Continue statin.  9.  Anxiety/history of delusions Per PCP patient with a history of anxiety and history of delusions.  PCP did state patient was on Seroquel and as such we will resume home regimen of Seroquel 25 mg twice daily per PCPs records.  Continue home regimen Cymbalta.  10.  Right toe pain Patient noted to have a block of cheese fall on her foot prior to admission.  Plain films of the right foot negative for any fracture.  Cold compresses.  Symptomatic care.  11.  Diabetes mellitus type 2 Hemoglobin A1c noted to be at 6.9.  Check CBGs, before meals and at bedtime.  Hold Victoza.  Place on sliding scale insulin.   DVT prophylaxis: SCDs Code Status: DNR Family Communication: Updated patient.  No family at bedside. Disposition:   Status is: Inpatient    Dispo: The patient is from: Home              Anticipated d/c is to: To be determined.  Likely home.              Anticipated d/c date is: Hopefully in 2 to 3 days.              Patient currently undergoing CHF treatment with IV Lasix, cardiology consultation pending, patient with complaints of black stools.  Currently not stable for discharge.       Consultants:   Cardiology pending  Procedures:   Plan films of the right foot 10/18/2019  Chest x-ray 10/18/2019  Antimicrobials:   None   Subjective: Patient laying on gurney.  States shortness of breath improving.  Complaining of feeling like she has some congestion stuck in her throat.  States she has black stools.  Denies any chest pain.  Patient complained of some abdominal discomfort.  Patient states she is supposed to see her EP physician because the pacemaker needs to be reassessed.  Objective: Vitals:   10/19/19 0500 10/19/19 0515 10/19/19 0730 10/19/19 1004  BP: (!) 112/49  124/69 124/69    Pulse: 83 82 84 90  Resp: 19 20 (!) 26   Temp: 98 F (36.7 C)     TempSrc: Oral     SpO2: 98% 97% 98% 98%  Weight:      Height:        Intake/Output Summary (Last 24 hours) at 10/19/2019 1018 Last data filed at 10/19/2019 0707 Gross per 24 hour  Intake --  Output 900 ml  Net -900 ml   Filed Weights   10/18/19 1840  Weight: 79.4 kg    Examination:  General exam: NAD Respiratory system: Bibasilar crackles.no wheezes,.  Fair air movement.  Speaking in full sentences. Cardiovascular system: S1 & S2 heard, RRR. No JVD, murmurs, rubs, gallops or clicks.  Trace to 1+ bilateral lower extremity edema.   Gastrointestinal system: Abdomen is nondistended, soft and nontender.Lower abd discomfort to palpation. No organomegaly or masses felt. Normal bowel sounds heard. Central nervous system: Alert and oriented. No focal neurological deficits. Extremities: Symmetric 5  x 5 power.  Right great toe with significant bruising/ecchymosis Skin: No rashes, lesions or ulcers Psychiatry: Judgement and insight appear normal. Mood & affect appropriate.     Data Reviewed: I have personally reviewed following labs and imaging studies  CBC: Recent Labs  Lab 10/18/19 1843  WBC 6.3  NEUTROABS 4.1  HGB 8.7*  HCT 27.5*  MCV 93.9  PLT 643    Basic Metabolic Panel: Recent Labs  Lab 10/18/19 1843 10/19/19 0634  NA 145 145  K 3.5 3.3*  CL 105 105  CO2 30 33*  GLUCOSE 141* 171*  BUN 42* 37*  CREATININE 1.49* 1.32*  CALCIUM 9.6 9.8  MG 1.9 1.6*    GFR: Estimated Creatinine Clearance: 26.8 mL/min (A) (by C-G formula based on SCr of 1.32 mg/dL (H)).  Liver Function Tests: Recent Labs  Lab 10/18/19 1843  AST 20  ALT 19  ALKPHOS 57  BILITOT 0.6  PROT 5.8*  ALBUMIN 3.1*    CBG: No results for input(s): GLUCAP in the last 168 hours.   Recent Results (from the past 240 hour(s))  SARS Coronavirus 2 by RT PCR (hospital order, performed in [facility] hospital lab) Nasopharyngeal  Nasopharyngeal Swab     Status: None   Collection Time: 10/18/19  6:43 PM   Specimen: Nasopharyngeal Swab  Result Value Ref Range Status   SARS Coronavirus 2 NEGATIVE NEGATIVE Final    Comment: (NOTE) SARS-CoV-2 target nucleic acids are NOT DETECTED.  The SARS-CoV-2 RNA is generally detectable in upper and lower respiratory specimens during the acute phase of infection. The lowest concentration of SARS-CoV-2 viral copies this assay can detect is 250 copies / mL. A negative result does not preclude SARS-CoV-2 infection and should not be used as the sole basis for treatment or other patient management decisions.  A negative result may occur with improper specimen collection / handling, submission of specimen other than nasopharyngeal swab, presence of viral mutation(s) within the areas targeted by this assay, and inadequate number of viral copies (<250 copies / mL). A negative result must be combined with clinical observations, patient history, and epidemiological information.  Fact Sheet for Patients:   StrictlyIdeas.no  Fact Sheet for Healthcare Providers: BankingDealers.co.za  This test is not yet approved or  cleared by the [place] FDA and has been authorized for detection and/or diagnosis of SARS-CoV-2 by FDA under an Emergency Use Authorization (EUA).  This EUA will remain in effect (meaning this test can be used) for the duration of the COVID-19 declaration under Section 564(b)(1) of the Act, 21 U.S.C. section 360bbb-3(b)(1), unless the authorization is terminated or revoked sooner.  Performed at [facility], [address], [place], [place] [address]          Radiology Studies: DG Chest 1 View  Result Date: 10/18/2019 CLINICAL DATA:  Pt presents via acems with c/o shortness of breath and congestion. Pt has hx of CHF. Pt chronically wears 2L at home. Pt respirations currently even and unlabored EXAM:  CHEST  1 VIEW COMPARISON:  10/04/2019 FINDINGS: Cardiac silhouette is mildly enlarged. Stable left anterior chest wall dual lead pacemaker. No mediastinal or hilar masses. Vascular congestion with mild interstitial prominence similar to the prior exam. Possible small pleural effusions. No lung consolidation to suggest pneumonia. No pneumothorax. Skeletal structures are demineralized but grossly intact. IMPRESSION: 1. Cardiomegaly, vascular congestion and interstitial prominence, with probable small effusions, similar to the prior study. Suspect mild congestive heart failure. No evidence of pneumonia. Electronically Signed  By: Lajean Manes M.D.   On: 10/18/2019 18:58   DG Foot 2 Views Right  Result Date: 10/18/2019 CLINICAL DATA:  Pain and discoloration to the right great toe. Patient had a frozen pack of cheese fall on it. EXAM: RIGHT FOOT - 2 VIEW COMPARISON:  None. FINDINGS: No fracture. There is flattening of the medial aspect of the distal first metatarsal and the metatarsal head consistent with previous bunion surgery. The first metatarsophalangeal joint show significant narrowing with subchondral sclerosis. The base of the proximal phalanx of the great toe is narrowed which may be from erosions or from previous surgery. IP joint is normally aligned. Remaining joints are normally aligned. Bones are demineralized. There is soft tissue swelling of the great toe and of the forefoot most evident dorsally. Arterial vascular calcifications extend from the ankle to the distal foot and toes. IMPRESSION: 1. No fracture or dislocation. 2. Advanced arthropathic changes at the first metatarsophalangeal joint, most likely osteoarthritis. There are postsurgical changes consistent with a previous bunionectomy. 3. Great toe and forefoot soft tissue swelling. Electronically Signed   By: Lajean Manes M.D.   On: 10/18/2019 20:24        Scheduled Meds: . amiodarone  100 mg Oral Daily  . aspirin EC  81 mg Oral Daily   . DULoxetine  30 mg Oral Daily  . furosemide  40 mg Intravenous Q12H  . metoprolol tartrate  25 mg Oral BID  . potassium chloride  40 mEq Oral Once  . rosuvastatin  40 mg Oral Daily  . sacubitril-valsartan  1 tablet Oral BID  . sodium chloride flush  3 mL Intravenous Q12H  . traZODone  100 mg Oral QHS   Continuous Infusions: . sodium chloride    . magnesium sulfate bolus IVPB       LOS: 1 day    Time spent: 35 minutes    Irine Seal, MD [facility]   To contact the attending provider between 7A-7P or the covering provider during after hours 7P-7A, please log into the web site [email or web address] and access using universal [place] password for that web site. If you do not have the password, please call the hospital operator.  10/19/2019, 10:18 AM

## 2019-10-19 NOTE — Progress Notes (Signed)
PT Cancellation Note  Patient Details Name: Evelyn Simmons MRN: [id number] DOB: 09-13-1929   Cancelled Treatment:    Reason Eval/Treat Not Completed: Patient declined, no reason specified. Patient sleeping upon arrival. She woke briefly but declines PT assessment at this time due to lethargy. Will return to re-attempt tomorrow.     Agapito Hanway 10/19/2019, 2:28 PM

## 2019-10-19 NOTE — ED Notes (Signed)
Pt's purewick device and canister changed. 985mL yellow urine emptied from canister. Dry bed pad placed, peri care performed by this RN.  Pt states she is comfortable.

## 2019-10-19 NOTE — ED Notes (Signed)
Attempted to call report, informed nurse was in pt room. This nurse provided floor with phone number and informed they would return this nurses call.

## 2019-10-19 NOTE — ED Notes (Signed)
Pt was given breakfast tray 

## 2019-10-19 NOTE — ED Notes (Addendum)
Pt assisted to bathroom via WC to have a BM. Pt was able to have BM. Hand hygiene preformed. Pt assisted back to bed, placed back on cardiac monitor, call light within reach. Pt has no further needs at this time. Urged to use call light for any needs.

## 2019-10-19 NOTE — Consult Note (Signed)
Cardiology Consultation:   Evelyn Simmons ID: Sheilyn Boehlke MRN: [id number]; DOB: March 04, 1929  Admit date: 10/18/2019 Date of Consult: 10/19/2019  Primary Care Provider: Dionicia Abler, NP Primary Cardiologist: Virl Axe, MD  Primary Electrophysiologist:  None    Evelyn Simmons Profile:   Evelyn Simmons is a 86 y.o. female with a hx of coronary artery disease s/p LAD and RCA stenting (1995 in 2011), SSS s/p [employer] PPM 2014, persistent atrial fibrillation not on anticoagulation secondary to prior history of bleeding and anemia with current positive FOBT, HFrEF (45 to 50%), ICM, anemia, DM2, hypertension, hyperlipidemia, chronic hypoxic respiratory failure on home O2, COPD, previous tobacco use, GERD, and who is being seen today for the evaluation of acute on chronic HFrEF at the request of Dr. [place] Silos.  History of Present Illness:   Evelyn Simmons is an 86 year old female with history of CAD s/p prior LAD and RCA stenting (1995, 2011), SSS s/p [employer] PPM 2014, chronic atrial fibrillation not on anticoagulation, HFrEF, ICM, anemia, DM2, and PMH as above.  In July 2021, she developed recurrent atrial fibrillation noted by PCP.  In early 09/2019, she began to experience palpitations and dyspnea and was admitted with the A. fib with RVR.  She was noted to be volume overloaded and IV diuresis.  She was initially placed on IV amiodarone; however, given lack of anticoagulation and atrial fibrillation of unknown chronicity, this was discontinued for risk of chemical conversion. Home low-dose p.o. amiodarone was also discontinued given it was not maintaining sinus rhythm and unlikely to chemically convert the Evelyn Simmons.  Given she was intolerant to anticoagulation, it was noted that preference was to avoid chemical and electrical cardioversion.  It was noted that if her atrial fibrillation remained challenging to control, anticoagulation and cardioversion could be reassessed later down the road versus AV  nodal ablation given she already had a pacemaker in place. ARB was deferred in the setting of reduced LVEF given intermittent soft BP and the potential need to escalate rate controlling medications. BB escalation limited by BP. It was recommended she limit her fluid at home and that she may need sliding scale torsemide. She was discharged 10/09/19 with torsemide 49m BID with extra torsemide PRN for weight gain. Wt 83.2kg  Of note, it appears that she was continued on amiodarone at time of discharge though on review of rounding notes by cardiology it was recommended that this medication be discontinued at discharge.  [facility]2019[facility] she opened her refrigerator and a block of cheese fell on her right foot, causing her immense pain.  She stated that she had open her refrigerator prior to leaving her home for [place], which she goes to several times a week.  Prior to this incident, she denies any chest pain.  She reports occasional racing heart rate and palpitations, though not increased from her usual frequency and she occasionally feels her atrial fibrillation.  She denies any increase in baseline shortness of breath, dyspnea, lower extremity edema, or orthopnea (stable 3 pillow orthopnea reported).  She did report dizziness that was at her baseline and denied any loss of consciousness or recent falls.  She reported occasional cough with phlegm production that was not unusual for her.  She stated that she remained on 2 L nasal cannula oxygen at home and had not need to increase this lately.  She does report melena and abdominal discomfort, especially during her bowel movements.  Hematochezia or BRBPR.  She reported adequate urine output, "  peeing all the time."  She lives at home and ambulates around her home with the assistance of a cane, which she has named Geographical information systems officer.  She does admit to drinking a lot of fluids.  She reports that she usually freezes 2 large containers of  water and then uses a knife to cut this up into ice chips and consumes them throughout the day, as she normally has a dry mouth.  She does not add "very much" salt to her food, reportedly getting her food through [employer] Whe[employer].  She reports medication compliance.   Of note, at times pt does appear transiently confused with HPI significantly different at time of cardiology consultation from that in the ED. On review of EMR, notes indicate Evelyn Simmons reported progressive cough and shortness of breath associated with phlegm production over the last 2 days at presentation to the emergency department.  She also reported worsened dyspnea on exertion.  She stated that her symptoms felt similar to those experienced during her previous admission for heart failure exacerbation.  She reported she had been on 3 L nasal cannula oxygen since the previous night. She also reported that the blockof cheese fell on her toe.  In the ED, vitals significant for HR 71 bpm, RR 21, BP 129/92, 96% on 2 L nasal cannula oxygen.  Weight 79.4 kg (previous discharge weight 83.2 kg).  Chest x-ray significant for cardiomegaly with vascular congestion and interstitial prominence, probable small effusions, noted to be similar to prior study and consistent with congestive heart failure.  FOBT positive.  Labs showed sodium 145, potassium 3.5, magnesium 1.6, creatinine 1.49, BUN 42, AST 20, ALT 19, albumin 3.1, BNP 377.8, high-sensitivity troponin 16  17, hemoglobin 8.7, RBC 2.93, hematocrit 27.5.  EKG showed afib and intermittent paced rhythm at 106bpm and with no acute ST/T changes.  She was started on IV Lasix 40 mg every 12 hours, as well as potassium and magnesium.  Heart Pathway Score:     Past Medical History:  Diagnosis Date  . (HFmrEF) heart failure with mid-range ejection fraction ([place])    a. 09/2019 Echo: EF 45-50%, glob HK, mild conc LVH, mildly elev PASP. Sev dil LA. Mildly dil RA. Mild MR.  Marland Kitchen Anemia   . Arthritis   . Asthma   .  CAD (coronary artery disease)    a. 1995 s/p PCI/BMS LAD; b. 2011 s/p PCI/DES RCA.  Marland Kitchen CHF (congestive heart failure) ([place])   . Chronic kidney disease   . COPD (chronic obstructive pulmonary disease) ([place])   . Depression   . Diabetes mellitus without complication ([place])   . Diverticulitis   . Dysrhythmia   . GERD (gastroesophageal reflux disease)   . Hyperlipidemia   . Hypertension   . Hypothyroid    pt unsure, not currently on medication  . Ischemic cardiomyopathy   . MI (myocardial infarction) ([place])    1995  . Persistent atrial fibrillation ([place])    a. No OAC 2/2 h/o bleeding/anemia; b. Longterm amio therapy; c. 08/2019 - recurrent afib-->rate-controlled.  . Presence of permanent cardiac pacemaker   . Radiculopathy     Past Surgical History:  Procedure Laterality Date  . ABDOMINAL HYSTERECTOMY    . APPENDECTOMY    . CARDIAC CATHETERIZATION    . CORONARY ANGIOPLASTY WITH STENT PLACEMENT  12/11/1993   RCA  . FOOT SURGERY Bilateral    Over 25 years ago- Bunionectomy  . INSERT / REPLACE / REMOVE PACEMAKER  09/2012   Model # [id number]  serial # [id number]  . JOINT REPLACEMENT     bilateral knee replacements     Home Medications:  Prior to Admission medications   Medication Sig Start Date End Date Taking? Authorizing Provider  amiodarone (PACERONE) 100 MG tablet Take 1 tablet (100 mg total) by mouth daily. 08/12/17  Yes Deboraha Sprang, MD  amLODipine (NORVASC) 2.5 MG tablet Take 2.5 mg by mouth daily.   Yes [provider]  aspirin EC 81 MG tablet Take 81 mg by mouth daily.   Yes [provider]  cholecalciferol (VITAMIN D) 1000 units tablet Take 1,000 Units by mouth daily.   Yes [provider]  DULoxetine (CYMBALTA) 30 MG capsule Take 30 mg by mouth daily.   Yes [provider]  ferrous sulfate 325 (65 FE) MG tablet Take 1 tablet (325 mg total) by mouth 2 (two) times daily with a meal. 10/11/19  Yes Nicole Kindred A, DO  liraglutide (VICTOZA) 18  MG/3ML SOPN Inject 0.6 mg into the skin daily. 10/11/12  Yes [provider]  loratadine (CLARITIN) 10 MG tablet Take 10 mg by mouth daily as needed for allergies.   Yes [provider]  metoprolol tartrate (LOPRESSOR) 25 MG tablet Take 1 tablet (25 mg total) by mouth 2 (two) times daily. 10/11/19  Yes Ezekiel Slocumb, DO  Multiple Vitamins-Minerals (PRESERVISION AREDS 2+MULTI VIT) CAPS Take 1 capsule by mouth 2 (two) times daily.   Yes [provider]  naloxone (NARCAN) nasal spray 4 mg/0.1 mL Place 1 spray into the nose once.   Yes [provider]  nitroGLYCERIN (NITROSTAT) 0.4 MG SL tablet Place 0.4 mg under the tongue every 5 (five) minutes as needed for chest pain.   Yes [provider]  pantoprazole (PROTONIX) 20 MG tablet Take 20 mg by mouth daily.   Yes [provider]  QUEtiapine (SEROQUEL) 25 MG tablet Take 25 mg by mouth 2 (two) times daily.   Yes [provider]  Sennosides-Docusate Sodium (SENEXON-S PO) Take by mouth. Take two tablets by mouth once daily   Yes [provider]  torsemide (DEMADEX) 20 MG tablet Take 1 tablet (20 mg total) by mouth 2 (two) times daily. 10/11/19  Yes Nicole Kindred A, DO  traMADol (ULTRAM) 50 MG tablet Take 50 mg by mouth 2 (two) times daily as needed for severe pain.   Yes [provider]  vitamin B-12 1000 MCG tablet Take 1 tablet (1,000 mcg total) by mouth daily. 10/11/19  Yes Nicole Kindred A, DO  albuterol (PROVENTIL HFA;VENTOLIN HFA) 108 (90 Base) MCG/ACT inhaler Inhale 2 puffs into the lungs every 6 (six) hours as needed for wheezing or shortness of breath.    [provider]  Blood Glucose Monitoring Suppl (ACCU-CHEK NANO SMARTVIEW) W/DEVICE KIT Please issue nano meter by AccuChek along with test strips at checking frequency of 2x daily. 90 day supply. E11.9 05/13/14   Phadke, Karsten Ro, MD  cyclobenzaprine (FLEXERIL) 5 MG tablet Take 5 mg by mouth 3 (three) times  daily as needed for muscle spasms. Evelyn Simmons not taking: Reported on 10/19/2019    [provider]  glucose blood (FREESTYLE LITE) test strip Use as instructed 05/30/14   Rubbie Battiest, RN  ipratropium-albuterol (DUONEB) 0.5-2.5 (3) MG/3ML SOLN Take 3 mLs by nebulization.    [provider]  Lancets (FREESTYLE) lancets Use as instructed 05/30/14   Rubbie Battiest, RN  rosuvastatin (CRESTOR) 40 MG tablet Take 1 tablet (40 mg total) by  mouth daily. 05/09/14   Minna Merritts, MD  traZODone (DESYREL) 100 MG tablet Take 100 mg by mouth at bedtime.    [provider]    Inpatient Medications: Scheduled Meds: . amiodarone  100 mg Oral Daily  . aspirin EC  81 mg Oral Daily  . DULoxetine  30 mg Oral Daily  . furosemide  40 mg Intravenous Q12H  . guaiFENesin  1,200 mg Oral BID  . metoprolol tartrate  25 mg Oral BID  . QUEtiapine  25 mg Oral BID  . rosuvastatin  40 mg Oral Daily  . sacubitril-valsartan  1 tablet Oral BID  . sodium chloride flush  3 mL Intravenous Q12H  . traZODone  100 mg Oral QHS   Continuous Infusions: . sodium chloride     PRN Meds: sodium chloride, acetaminophen, ipratropium-albuterol, ondansetron (ZOFRAN) IV, sodium chloride flush  Allergies:    Allergies  Allergen Reactions  . Penicillins Nausea And Vomiting, Rash and Hives  . Other Other (See Comments)  . Sulfa Antibiotics     Pt unsure reaction, was told in hospital she had allergy  . Penicillin G Rash    Social History:   Social History   Socioeconomic History  . Marital status: Widowed    Spouse name: Not on file  . Number of children: Not on file  . Years of education: Not on file  . Highest education level: Not on file  Occupational History  . Not on file  Tobacco Use  . Smoking status: Former Smoker    Quit date: 12/10/1993    Years since quitting: 25.8  . Smokeless tobacco: Former Network engineer and Sexual Activity  . Alcohol use: Yes    Alcohol/week: 0.0 standard  drinks    Comment: Rare  . Drug use: No  . Sexual activity: Not Currently  Other Topics Concern  . Not on file  Social History Narrative   Moved from [place] with eldest daughter   7 children, 11 grandchildren, 49 GG children    Social Determinants of Health   Financial Resource Strain:   . Difficulty of Paying Living Expenses: Not on file  Food Insecurity:   . Worried About Charity fundraiser in the Last Year: Not on file  . Ran Out of Food in the Last Year: Not on file  Transportation Needs:   . Lack of Transportation (Medical): Not on file  . Lack of Transportation (Non-Medical): Not on file  Physical Activity:   . Days of Exercise per Week: Not on file  . Minutes of Exercise per Session: Not on file  Stress:   . Feeling of Stress : Not on file  Social Connections:   . Frequency of Communication with Friends and Family: Not on file  . Frequency of Social Gatherings with Friends and Family: Not on file  . Attends Religious Services: Not on file  . Active Member of Clubs or Organizations: Not on file  . Attends Archivist Meetings: Not on file  . Marital Status: Not on file  Intimate Partner Violence:   . Fear of Current or Ex-Partner: Not on file  . Emotionally Abused: Not on file  . Physically Abused: Not on file  . Sexually Abused: Not on file    Family History:    Family History  Problem Relation Age of Onset  . Arthritis Mother   . Heart disease Mother   . Hypertension Mother   . Diabetes Mother   .  Arthritis Father   . Heart disease Father   . Hypertension Father   . Cancer Sister        breast cancer  . Diabetes Brother   . Cancer Daughter        lung cancer  . Diabetes Sister      ROS:  Please see the history of present illness.  Review of Systems  Constitutional: Negative for fever.  Respiratory: Positive for cough, sputum production and shortness of breath. Negative for hemoptysis.        Reports chronic cough.  Shortness of  breath and dyspnea reported to be at baseline.  Cardiovascular: Positive for palpitations and orthopnea. Negative for chest pain and leg swelling.       Tachypalpitations, not increased from baseline.  3 pillow orthopnea, not increased from baseline.  Gastrointestinal: Positive for abdominal pain and melena. Negative for blood in stool, nausea and vomiting.  Genitourinary: Negative for hematuria.       Reports adequate urination.  Musculoskeletal: Negative for falls.  Neurological: Negative for dizziness, loss of consciousness and weakness.  All other systems reviewed and are negative.   All other ROS reviewed and negative.     Physical Exam/Data:   Vitals:   10/19/19 0500 10/19/19 0515 10/19/19 0730 10/19/19 1004  BP: (!) 112/49  124/69 124/69  Pulse: 83 82 84 90  Resp: 19 20 (!) 26   Temp: 98 F (36.7 C)     TempSrc: Oral     SpO2: 98% 97% 98% 98%  Weight:      Height:        Intake/Output Summary (Last 24 hours) at 10/19/2019 1410 Last data filed at 10/19/2019 0707 Gross per 24 hour  Intake --  Output 900 ml  Net -900 ml   Last 3 Weights 10/18/2019 10/11/2019 10/10/2019  Weight (lbs) 175 lb 183 lb 6.4 oz 183 lb 4.8 oz  Weight (kg) 79.379 kg 83.19 kg 83.144 kg     Body mass index is 35.35 kg/m.  General:  Obese female, NAD HEENT: Nasal cannula oxygen not in place and removed below chin by Evelyn Simmons (2 L nasal cannula) Neck: JVD difficult to assess due to body habitus Vascular: radial pulses 2+ bilaterally Cardiac:  normal S1, S2; IRIR; soft heart sounds Lungs: Mildly diminished breath sounds bilaterally and worse with bases Abd: soft, nontender, no hepatomegaly  Ext: no significant lower extremity edema Musculoskeletal:  No deformities, BUE and BLE strength normal and equal Skin: warm and dry  Neuro:  No focal abnormalities noted, confused at times Psych:  Normal affect   EKG:  The EKG was personally reviewed and demonstrates: Atrial fibrillation with rapid ventricular  response, intermittent pacing Telemetry:  Telemetry was personally reviewed and demonstrates: afib paced rhythm  Relevant CV Studies: Echo 09/18/19 1. Left ventricular ejection fraction, by estimation, is 45 to 50%. The  left ventricle has mildly decreased function. The left ventricle  demonstrates global hypokinesis. There is mild concentric left ventricular  hypertrophy. Left ventricular diastolic  parameters are indeterminate. Elevated left ventricular end-diastolic  pressure.  2. Right ventricular systolic function is mildly reduced. The right  ventricular size is normal. There is mildly elevated pulmonary artery  systolic pressure.  3. Left atrial size was severely dilated.  4. Right atrial size was mildly dilated.  5. The mitral valve is normal in structure. Mild mitral valve  regurgitation. No evidence of mitral stenosis.  6. The aortic valve is tricuspid. Aortic valve regurgitation is not  visualized. No aortic stenosis is present.  7. The inferior vena cava is normal in size with <50% respiratory  variability, suggesting right atrial pressure of 8 mmHg.   Laboratory Data:  High Sensitivity Troponin:   Recent Labs  Lab 10/02/19 1032 10/04/19 0935 10/04/19 1600 10/18/19 1843 10/18/19 2337  TROPONINIHS 13 13 24* 16 17     Cardiac EnzymesNo results for input(s): TROPONINI in the last 168 hours. No results for input(s): TROPIPOC in the last 168 hours.  Chemistry Recent Labs  Lab 10/18/19 1843 10/19/19 0634  NA 145 145  K 3.5 3.3*  CL 105 105  CO2 30 33*  GLUCOSE 141* 171*  BUN 42* 37*  CREATININE 1.49* 1.32*  CALCIUM 9.6 9.8  GFRNONAA 31* 36*  GFRAA 36* 42*  ANIONGAP 10 7    Recent Labs  Lab 10/18/19 1843  PROT 5.8*  ALBUMIN 3.1*  AST 20  ALT 19  ALKPHOS 57  BILITOT 0.6   Hematology Recent Labs  Lab 10/18/19 1843  WBC 6.3  RBC 2.93*  HGB 8.7*  HCT 27.5*  MCV 93.9  MCH 29.7  MCHC 31.6  RDW 16.6*  PLT 293   BNP Recent Labs  Lab  10/18/19 1843  BNP 377.8*    DDimer No results for input(s): DDIMER in the last 168 hours.   Radiology/Studies:  DG Chest 1 View  Result Date: 10/18/2019 CLINICAL DATA:  Pt presents via acems with c/o shortness of breath and congestion. Pt has hx of CHF. Pt chronically wears 2L at home. Pt respirations currently even and unlabored EXAM: CHEST  1 VIEW COMPARISON:  10/04/2019 FINDINGS: Cardiac silhouette is mildly enlarged. Stable left anterior chest wall dual lead pacemaker. No mediastinal or hilar masses. Vascular congestion with mild interstitial prominence similar to the prior exam. Possible small pleural effusions. No lung consolidation to suggest pneumonia. No pneumothorax. Skeletal structures are demineralized but grossly intact. IMPRESSION: 1. Cardiomegaly, vascular congestion and interstitial prominence, with probable small effusions, similar to the prior study. Suspect mild congestive heart failure. No evidence of pneumonia. Electronically Signed   By: Lajean Manes M.D.   On: 10/18/2019 18:58   DG Abd 2 Views  Result Date: 10/19/2019 CLINICAL DATA:  Epigastric pain EXAM: ABDOMEN - 2 VIEW COMPARISON:  None. FINDINGS: Scattered large and small bowel gas is noted. No obstructive changes seen. No free air is noted. Diffuse vascular calcifications are noted. No renal or ureteral stones are seen. Degenerative change of lumbar spine with scoliosis concave to the right is noted. No acute abnormality noted. IMPRESSION: Chronic changes as described without acute abnormality. Electronically Signed   By: Inez Catalina M.D.   On: 10/19/2019 11:03   DG Foot 2 Views Right  Result Date: 10/18/2019 CLINICAL DATA:  Pain and discoloration to the right great toe. Evelyn Simmons had a frozen pack of cheese fall on it. EXAM: RIGHT FOOT - 2 VIEW COMPARISON:  None. FINDINGS: No fracture. There is flattening of the medial aspect of the distal first metatarsal and the metatarsal head consistent with previous bunion surgery.  The first metatarsophalangeal joint show significant narrowing with subchondral sclerosis. The base of the proximal phalanx of the great toe is narrowed which may be from erosions or from previous surgery. IP joint is normally aligned. Remaining joints are normally aligned. Bones are demineralized. There is soft tissue swelling of the great toe and of the forefoot most evident dorsally. Arterial vascular calcifications extend from the ankle to the distal foot and toes. IMPRESSION:  1. No fracture or dislocation. 2. Advanced arthropathic changes at the first metatarsophalangeal joint, most likely osteoarthritis. There are postsurgical changes consistent with a previous bunionectomy. 3. Great toe and forefoot soft tissue swelling. Electronically Signed   By: Lajean Manes M.D.   On: 10/18/2019 20:24    Assessment and Plan:   Heart failure with midrange ejection fraction/ischemic cardiomyopathy --Denies shortness of breath or progressive heart failure symptoms, though this was reported by the ED earlier and when first presented.  On consultation with cardiology, reports stable DOE, 3 pillow orthopnea since that time.  BNP 377.8. Chest x-ray as above shows bilateral pleural effusions as seen in previous imaging though also notes consistent with mild congestive heart failure. Started on IV lasix yesterday with increased urination per pt and appears to be improved in volume status on exam today. Current weight below that of previous discharge weight. Considered is dietary noncompliance with Evelyn Simmons reporting high fluid intake and that still utilizing salt. Recent echo as directly above with EF 45-50%.  --Continue IV lasix and titrate as renal function allows.  --Transition back to oral diuresis before discharge. --Daily BMET. Renal function stable. Cr 1.49  1.32 with BUN 42  37.  --Continue to monitor I's/O's, daily standing weights. --CHF education needed. Provided some CHF education today in including  recommendation for under 2 g daily salt and under 2 L daily total fluids. --Continue current medications, including diuresis and beta-blocker.  Persistent atrial fibrillation --Denies any current chest pain, racing heart rate, palpitations. --Plan for rate control only. Continue current metoprolol and escalate as BP allows.  Previously, escalation of rate controlling medications limited by hypotension/orthostasis.  P.o. amiodarone discontinued during last admission but restarted at time of discharge. Discontinued oral amiodarone. As noted during previous admissions, avoid diltiazem given mild decrease in LV SF.  Avoid digoxin in the setting of CKDIII. Amiodarone discontinued as it carries risk of pharmacologic conversion with Evelyn Simmons not on anticoagulation and has not assisted in maintaining NSR. --She is not anticoagulated secondary to history of GI bleed and anemia. Risk of bleeding with history of falls and bleeding and current positive FOBT outweighs that of benefit despite CHA2DS2VASc score of at least 6 (CHF, HTN, agex2, vascular, female).  --No plan for cardioversion given Evelyn Simmons is not therapeutically anticoagulated; therefore, DCCV carries risk of thromboembolic event. Given her BMI and severely dilated left atrium, she is also unlikely to hold NSR.   Hypokalemia, hypomagnesemia --Replete K with goal 4.0.  Replete magnesium with goal 2.0.  Electrolyte repletion indicated to prevent risk of arrhythmia.   ?GI bleed, + FOBT; Anemia --FOBT positive.  Reports abdominal pain and difficulty with bowel movements.  Also reports melena.  Defer workup to IM. Daily CBC.  Recommend transfuse below 8.0.   Hypoalbuminemia  --Consider nutrition consult.   Essential hypertension  --Current blood pressure well controlled.  Continue current medications.  Coronary artery disease --No chest pain.  High-sensitivity troponin minimally elevated and flat trending.  Not consistent with ACS.  More consistent  with mild supply demand ischemia in the setting of anemia, heart failure, and CKD.  Continue current beta-blocker, statin. Consider holding ASA if Hgb drops below 8.0.  CKDIII --Daily BMET. Current renal function at baseline. Caution with diuresis and monitor renal function closely.   HLD, LDL goal <70 --Continue statin.  8/7 LDL 70 and well controlled.     For questions or updates, please contact [place] Please consult [email or web address] for contact info under  Signed, Arvil Chaco, PA-C  10/19/2019 2:10 PM

## 2019-10-20 ENCOUNTER — Ambulatory Visit: Payer: TRICARE For Life (TFL) | Admitting: Family

## 2019-10-20 DIAGNOSIS — D500 Iron deficiency anemia secondary to blood loss (chronic): Secondary | ICD-10-CM

## 2019-10-20 DIAGNOSIS — D519 Vitamin B12 deficiency anemia, unspecified: Secondary | ICD-10-CM

## 2019-10-20 DIAGNOSIS — I509 Heart failure, unspecified: Secondary | ICD-10-CM

## 2019-10-20 LAB — CBC WITH DIFFERENTIAL/PLATELET
Abs Immature Granulocytes: 0.02 10*3/uL (ref 0.00–0.07)
Basophils Absolute: 0 10*3/uL (ref 0.0–0.1)
Basophils Relative: 0 %
Eosinophils Absolute: 0 10*3/uL (ref 0.0–0.5)
Eosinophils Relative: 0 %
HCT: 25.1 % — ABNORMAL LOW (ref 36.0–46.0)
Hemoglobin: 7.7 g/dL — ABNORMAL LOW (ref 12.0–15.0)
Immature Granulocytes: 0 %
Lymphocytes Relative: 11 %
Lymphs Abs: 0.9 10*3/uL (ref 0.7–4.0)
MCH: 29.7 pg (ref 26.0–34.0)
MCHC: 30.7 g/dL (ref 30.0–36.0)
MCV: 96.9 fL (ref 80.0–100.0)
Monocytes Absolute: 0.8 10*3/uL (ref 0.1–1.0)
Monocytes Relative: 10 %
Neutro Abs: 6.2 10*3/uL (ref 1.7–7.7)
Neutrophils Relative %: 79 %
Platelets: 236 10*3/uL (ref 150–400)
RBC: 2.59 MIL/uL — ABNORMAL LOW (ref 3.87–5.11)
RDW: 16.9 % — ABNORMAL HIGH (ref 11.5–15.5)
WBC: 7.9 10*3/uL (ref 4.0–10.5)
nRBC: 0 % (ref 0.0–0.2)

## 2019-10-20 LAB — MAGNESIUM: Magnesium: 2.2 mg/dL (ref 1.7–2.4)

## 2019-10-20 LAB — BASIC METABOLIC PANEL
Anion gap: 7 (ref 5–15)
BUN: 27 mg/dL — ABNORMAL HIGH (ref 8–23)
CO2: 31 mmol/L (ref 22–32)
Calcium: 9.6 mg/dL (ref 8.9–10.3)
Chloride: 106 mmol/L (ref 98–111)
Creatinine, Ser: 1.08 mg/dL — ABNORMAL HIGH (ref 0.44–1.00)
GFR calc Af Amer: 53 mL/min — ABNORMAL LOW (ref >60)
GFR calc non Af Amer: 46 mL/min — ABNORMAL LOW (ref >60)
Glucose, Bld: 144 mg/dL — ABNORMAL HIGH (ref 70–99)
Potassium: 3.7 mmol/L (ref 3.5–5.1)
Sodium: 144 mmol/L (ref 135–145)

## 2019-10-20 LAB — FERRITIN: Ferritin: 29 ng/mL (ref 11–307)

## 2019-10-20 LAB — IRON AND TIBC
Iron: 17 ug/dL — ABNORMAL LOW (ref 28–170)
Saturation Ratios: 6 % — ABNORMAL LOW (ref 10.4–31.8)
TIBC: 266 ug/dL (ref 250–450)
UIBC: 249 ug/dL

## 2019-10-20 LAB — GLUCOSE, CAPILLARY
Glucose-Capillary: 128 mg/dL — ABNORMAL HIGH (ref 70–99)
Glucose-Capillary: 107 mg/dL — ABNORMAL HIGH (ref 70–99)
Glucose-Capillary: 191 mg/dL — ABNORMAL HIGH (ref 70–99)

## 2019-10-20 LAB — VITAMIN B12: Vitamin B-12: 402 pg/mL (ref 180–914)

## 2019-10-20 MED ORDER — MAGNESIUM CITRATE PO SOLN
1.0000 | Freq: Once | ORAL | Status: AC
  Administered 2019-10-20: 1 via ORAL
  Filled 2019-10-20 (×2): qty 296

## 2019-10-20 MED ORDER — FUROSEMIDE 40 MG PO TABS
40.0000 mg | ORAL_TABLET | Freq: Two times a day (BID) | ORAL | Status: DC
  Administered 2019-10-20 – 2019-10-21 (×2): 40 mg via ORAL
  Filled 2019-10-20 (×3): qty 1

## 2019-10-20 MED ORDER — POLYETHYLENE GLYCOL 3350 17 GM/SCOOP PO POWD
1.0000 | Freq: Once | ORAL | Status: AC
  Administered 2019-10-20: 255 g via ORAL
  Filled 2019-10-20 (×2): qty 255

## 2019-10-20 NOTE — Hospital Course (Signed)
Evelyn Simmons is a 86 y.o. female with medical history significant for  COPD and chronic respiratory failure on home O2 at 2-3 L/min, DM , systolic and diastolic CHF, atrial fibrillation not anti-coagualted secondary to GI bleed,  history of sick sinus syndrome s/p  pacemaker, HTN, HDL, and hypothyroidism who presents for assessment of 2 days of worsening cough and shortness of breath especially with ambulation.  Of note, patient admitted for decompensated CHF from 8/23 to 8/30.  Discharged on torsemide.  SNF was recommended for short term rehab, but patient declined as her son would be coming to stay with her for as long as needed.  Evaluation in the ED revealed pulmonary vascular congestion on chest xray and elevated BNP.  No fever, leukocytosis or focal infiltrate om xray to suggest infection.  Admitted to hospitalist service for further management of decompensated CHF.

## 2019-10-20 NOTE — Progress Notes (Signed)
Progress Note  Patient Name: Evelyn Simmons Date of Encounter: 10/20/2019  Primary Cardiologist: Virl Axe, MD   Subjective   Denies CP, racing HR, palpitations, presyncope, SOB.  Inpatient Medications    Scheduled Meds: . aspirin EC  81 mg Oral Daily  . DULoxetine  30 mg Oral Daily  . furosemide  40 mg Oral BID  . guaiFENesin  1,200 mg Oral BID  . insulin aspart  0-9 Units Subcutaneous TID WC  . metoprolol tartrate  25 mg Oral BID  . pantoprazole (PROTONIX) IV  40 mg Intravenous Q12H  . QUEtiapine  25 mg Oral BID  . rosuvastatin  10 mg Oral Daily  . sodium chloride flush  3 mL Intravenous Q12H  . traZODone  100 mg Oral QHS   Continuous Infusions: . sodium chloride     PRN Meds: sodium chloride, acetaminophen, ipratropium-albuterol, ondansetron (ZOFRAN) IV, sodium chloride flush   Vital Signs    Vitals:   10/19/19 2330 10/20/19 0458 10/20/19 0541 10/20/19 0803  BP:  (!) 107/59 (!) 113/53 117/61  Pulse:  (!) 135 83 75  Resp:  17 18 18   Temp:  99 F (37.2 C) 97.8 F (36.6 C) 98.5 F (36.9 C)  TempSrc:  Oral Oral Oral  SpO2:  96% 97% 97%  Weight: 80.6 kg  80.2 kg   Height:        Intake/Output Summary (Last 24 hours) at 10/20/2019 1022 Last data filed at 10/20/2019 0500 Gross per 24 hour  Intake --  Output 150 ml  Net -150 ml   Last 3 Weights 10/20/2019 10/19/2019 10/18/2019  Weight (lbs) 176 lb 12.9 oz 177 lb 9.6 oz 175 lb  Weight (kg) 80.2 kg 80.559 kg 79.379 kg      Telemetry    Paced afib rhythm - Personally Reviewed  ECG    No new tracings  - Personally Reviewed  Physical Exam   GEN: No acute distress.   Neck: No JVD Cardiac: IRIR, no murmurs, rubs, or gallops.  Respiratory: Diminished breath sounds bilaterally GI: Soft, nontender, non-distended  MS: trace to No edema; No deformity. Neuro:  Nonfocal  Psych: Normal affect   Labs    High Sensitivity Troponin:   Recent Labs  Lab 10/02/19 1032 10/04/19 0935 10/04/19 1600 10/18/19 1843  10/18/19 2337  TROPONINIHS 13 13 24* 16 17      Chemistry Recent Labs  Lab 10/18/19 1843 10/19/19 0634 10/20/19 0532  NA 145 145 144  K 3.5 3.3* 3.7  CL 105 105 106  CO2 30 33* 31  GLUCOSE 141* 171* 144*  BUN 42* 37* 27*  CREATININE 1.49* 1.32* 1.08*  CALCIUM 9.6 9.8 9.6  PROT 5.8*  --   --   ALBUMIN 3.1*  --   --   AST 20  --   --   ALT 19  --   --   ALKPHOS 57  --   --   BILITOT 0.6  --   --   GFRNONAA 31* 36* 46*  GFRAA 36* 42* 53*  ANIONGAP 10 7 7      Hematology Recent Labs  Lab 10/18/19 1843 10/19/19 1454 10/20/19 0532  WBC 6.3 7.8 7.9  RBC 2.93* 2.85* 2.59*  HGB 8.7* 8.4* 7.7*  HCT 27.5* 27.7* 25.1*  MCV 93.9 97.2 96.9  MCH 29.7 29.5 29.7  MCHC 31.6 30.3 30.7  RDW 16.6* 16.6* 16.9*  PLT 293 250 236    BNP Recent Labs  Lab 10/18/19 1843  BNP 377.8*     DDimer No results for input(s): DDIMER in the last 168 hours.   Radiology    DG Chest 1 View  Result Date: 10/18/2019 CLINICAL DATA:  Pt presents via acems with c/o shortness of breath and congestion. Pt has hx of CHF. Pt chronically wears 2L at home. Pt respirations currently even and unlabored EXAM: CHEST  1 VIEW COMPARISON:  10/04/2019 FINDINGS: Cardiac silhouette is mildly enlarged. Stable left anterior chest wall dual lead pacemaker. No mediastinal or hilar masses. Vascular congestion with mild interstitial prominence similar to the prior exam. Possible small pleural effusions. No lung consolidation to suggest pneumonia. No pneumothorax. Skeletal structures are demineralized but grossly intact. IMPRESSION: 1. Cardiomegaly, vascular congestion and interstitial prominence, with probable small effusions, similar to the prior study. Suspect mild congestive heart failure. No evidence of pneumonia. Electronically Signed   By: Lajean Manes M.D.   On: 10/18/2019 18:58   DG Abd 2 Views  Result Date: 10/19/2019 CLINICAL DATA:  Epigastric pain EXAM: ABDOMEN - 2 VIEW COMPARISON:  None. FINDINGS: Scattered  large and small bowel gas is noted. No obstructive changes seen. No free air is noted. Diffuse vascular calcifications are noted. No renal or ureteral stones are seen. Degenerative change of lumbar spine with scoliosis concave to the right is noted. No acute abnormality noted. IMPRESSION: Chronic changes as described without acute abnormality. Electronically Signed   By: Inez Catalina M.D.   On: 10/19/2019 11:03   DG Foot 2 Views Right  Result Date: 10/18/2019 CLINICAL DATA:  Pain and discoloration to the right great toe. Patient had a frozen pack of cheese fall on it. EXAM: RIGHT FOOT - 2 VIEW COMPARISON:  None. FINDINGS: No fracture. There is flattening of the medial aspect of the distal first metatarsal and the metatarsal head consistent with previous bunion surgery. The first metatarsophalangeal joint show significant narrowing with subchondral sclerosis. The base of the proximal phalanx of the great toe is narrowed which may be from erosions or from previous surgery. IP joint is normally aligned. Remaining joints are normally aligned. Bones are demineralized. There is soft tissue swelling of the great toe and of the forefoot most evident dorsally. Arterial vascular calcifications extend from the ankle to the distal foot and toes. IMPRESSION: 1. No fracture or dislocation. 2. Advanced arthropathic changes at the first metatarsophalangeal joint, most likely osteoarthritis. There are postsurgical changes consistent with a previous bunionectomy. 3. Great toe and forefoot soft tissue swelling. Electronically Signed   By: Lajean Manes M.D.   On: 10/18/2019 20:24    Cardiac Studies   Echo 09/18/19 1. Left ventricular ejection fraction, by estimation, is 45 to 50%. The  left ventricle has mildly decreased function. The left ventricle  demonstrates global hypokinesis. There is mild concentric left ventricular  hypertrophy. Left ventricular diastolic  parameters are indeterminate. Elevated left ventricular  end-diastolic  pressure.  2. Right ventricular systolic function is mildly reduced. The right  ventricular size is normal. There is mildly elevated pulmonary artery  systolic pressure.  3. Left atrial size was severely dilated.  4. Right atrial size was mildly dilated.  5. The mitral valve is normal in structure. Mild mitral valve  regurgitation. No evidence of mitral stenosis.  6. The aortic valve is tricuspid. Aortic valve regurgitation is not  visualized. No aortic stenosis is present.  7. The inferior vena cava is normal in size with <50% respiratory  variability, suggesting right atrial pressure of 8 mmHg.   Patient  Profile     86 y.o. female with a hx of coronary artery disease s/p LAD and RCA stenting (1995 in 2011), SSS s/p [employer] PPM 2014, persistent atrial fibrillation not on anticoagulation secondary to prior history of bleeding and anemia with current positive FOBT, HFrEF (45 to 50%), ICM, anemia, DM2, hypertension, hyperlipidemia, chronic hypoxic respiratory failure on home O2, COPD, previous tobacco use, GERD, and who is being seen today for the evaluation of acute on chronic HFrEF.  Assessment & Plan    Heart failure with midrange ejection fraction/ischemic cardiomyopathy --Denies shortness of breath or progressive heart failure symptoms, though this was reported by the ED earlier and when first presented.  Not significantly volume overloaded at presentation though reports dietary noncompliance with high fluid intake and that still utilizing salt. Recent echo as directly above with EF 45-50%.  --Continue IV lasix and titrate as renal function allows with current renal function stable. As approaches baseline status, transition to oral diuresis.  --Daily BMET. Renal function stable. Cr 1.49  1.32  1.08 with BUN 42  37  27.  --Continue to monitor I's/O's, daily standing weights. --CHF education needed. Provided some CHF education today in including recommendation  for under 2 g daily salt and under 2 L daily total fluids. Continue current medications, including diuresis and beta-blocker.  Persistent atrial fibrillation --Denies any current chest pain, racing heart rate, palpitations. --Plan for rate control only. Continue current metoprolol and escalate as BP allows.  Previously, escalation of rate controlling medications limited by hypotension/orthostasis.  P.o. amiodarone discontinued during last admission but restarted at time of discharge. Discontinued oral amiodarone. As noted during previous admissions, avoid diltiazem given mild decrease in LV SF.  Avoid digoxin in the setting of CKDIII. Amiodarone discontinued as it carries risk of pharmacologic conversion with patient not on anticoagulation and has not assisted in maintaining NSR. --She is not anticoagulated secondary to history of GI bleed and anemia. Risk of bleeding with history of falls and bleeding and current positive FOBT outweighs that of benefit despite CHA2DS2VASc score of at least 6 (CHF, HTN, agex2, vascular, female).  --No plan for cardioversion given patient is not therapeutically anticoagulated; therefore, DCCV carries risk of thromboembolic event. Given her BMI and severely dilated left atrium, she is also unlikely to hold NSR.   Hypokalemia, hypomagnesemia --Replete K with goal 4.0.  Replete magnesium with goal 2.0.  Electrolyte repletion indicated to prevent risk of arrhythmia.   ?GI bleed, + FOBT; Anemia --FOBT positive.  Defer workup to IM, GI. Marland Kitchen   Hypoalbuminemia  --Consider nutrition consult.   Essential hypertension  --Current blood pressure well controlled to soft.  Continue current medications.  Coronary artery disease --No chest pain.  High-sensitivity troponin minimally elevated and flat trending.  Not consistent with ACS.  More consistent with mild supply demand ischemia in the setting of anemia, heart failure, and CKD.  Continue current beta-blocker, statin.  Consider holding ASA if Hgb drops below 8.0.  CKDIII --Daily BMET. Current renal function stable.   HLD, LDL goal <70 --Continue statin.  8/7 LDL 70 and well controlled.  For questions or updates, please contact [facility] Please consult [email or web address] for contact info under        Signed, Arvil Chaco, PA-C  10/20/2019, 10:22 AM

## 2019-10-20 NOTE — Progress Notes (Addendum)
°  Heart Failure Nurse Navigator Note:  Initial Visit with patient.  HFmrEF (45-50%)  She presented from home after experiencing increasing SOB on exertion, productive cough.  CXR revealed pulmonary vascular congestion.  Comorbidities:  Coronary artery disease Diabetes COPD Persistent A Fib Anemia   Medications:  Amiodarone 200 mg daily Aspirin 81 mg daily Lasix 40 mg IV BID Metoprolol tartrate 25 mg BID Crestor 20 mg daily  Labs:  Sodium 144, Potassium 3.7, BUN 27,creatinine 1.08.  Hemoglobin 7.7(was 8.7 on admission.)  BNP on admission was 377.8  Intake and Output:  Intake is incomplete Output-1050 ML.  Patient seen today while sitting up in the chair at bedside.  She states that she feels almost back to her normal self.  She viewed heart failure video, discussed what she had learned.  Also discussed not adding salt, the importance of daily weight and recording, which she states she does.  Also discussed on drinking more than 8-8oz cups of liquid in a 24 hour period. Discussed her diet - she offers that she eats a lot of fresh vegatables.  She states that she had noted a 5 # weight gain at home and attempted to call her doctor but was unable to speak to her and then came in to the ED.   Given heart failure teaching booklet and magnet.  Will continue to follow during her hospitalization.  Pricilla Riffle RN, CHFN

## 2019-10-20 NOTE — Consult Note (Signed)
Evelyn Darby, MD [address]  [place]  [place], [place] [address]  Main: [phone]  Fax: [phone] Pager: [phone]   Consultation  Referring Provider:     No ref. provider found Primary Care Physician:  Dionicia Abler, NP Primary Gastroenterologist:  Althia Forts      Reason for Consultation:     FOBT positive, worsening anemia  Date of Admission:  10/18/2019 Date of Consultation:  10/20/2019         HPI:   Evelyn Simmons is a 86 y.o. female with history of A. fib, several admissions for decompensated heart failure, history of coronary artery disease s/p PCI 95 and 2011, CHF EF of 45 to 50%, persistent A. fib not on anticoagulation due to prior history of presumed GI bleed and anemia, history of CKD.  Patient did not undergo work-up for anemia.  She also has history of COPD, chronic hypoxic respiratory failure on home O2 who is admitted with acute on chronic congestive heart failure. Patient is currently being diuresed, with her heart failure clinically optimized.  Patient's hemoglobin on 09/17/2019 was 11, it was normal in 2019.  Since August, it has been slowly downtrending.  On current admission her hemoglobin was 8.7, dropped to 7.7 today.  FOBT was positive and therefore GI is consulted for further evaluation.  Looks like patient did not want to undergo GI work-up during last admission when she had drop in hemoglobin.  She also had mild iron and B12 deficiency, therefore discharged on iron and B12 supplementation. Patient is currently on Protonix 40 mg IV twice daily  Patient reports that for last 2 to 3 months, she has been having black stools that are jelly like.  She always struggles to have a bowel movement and she has to drink plenty of water.  She does report pain in her epigastric region as well as left lower quadrant region  She lives alone at home and uses walker for support. Patient is sitting up in chair when interviewed her  NSAIDs:  None  Antiplts/Anticoagulants/Anti thrombotics: None  GI Procedures: None  Past Medical History:  Diagnosis Date  . (HFmrEF) heart failure with mid-range ejection fraction ([place])    a. 09/2019 Echo: EF 45-50%, glob HK, mild conc LVH, mildly elev PASP. Sev dil LA. Mildly dil RA. Mild MR.  Marland Kitchen Anemia   . Arthritis   . Asthma   . CAD (coronary artery disease)    a. 1995 s/p PCI/BMS LAD; b. 2011 s/p PCI/DES RCA.  Marland Kitchen CHF (congestive heart failure) ([place])   . Chronic kidney disease   . COPD (chronic obstructive pulmonary disease) ([place])   . Depression   . Diabetes mellitus without complication ([place])   . Diverticulitis   . Dysrhythmia   . GERD (gastroesophageal reflux disease)   . Hyperlipidemia   . Hypertension   . Hypothyroid    pt unsure, not currently on medication  . Ischemic cardiomyopathy   . MI (myocardial infarction) ([place])    1995  . Persistent atrial fibrillation ([place])    a. No OAC 2/2 h/o bleeding/anemia; b. Longterm amio therapy; c. 08/2019 - recurrent afib-->rate-controlled.  . Presence of permanent cardiac pacemaker   . Radiculopathy     Past Surgical History:  Procedure Laterality Date  . ABDOMINAL HYSTERECTOMY    . APPENDECTOMY    . CARDIAC CATHETERIZATION    . CORONARY ANGIOPLASTY WITH STENT PLACEMENT  12/11/1993   RCA  . FOOT SURGERY Bilateral  Over 25 years ago- Bunionectomy  . INSERT / REPLACE / REMOVE PACEMAKER  09/2012   Model # F290  serial # [id number]  . JOINT REPLACEMENT     bilateral knee replacements    Prior to Admission medications   Medication Sig Start Date End Date Taking? Authorizing Provider  amiodarone (PACERONE) 100 MG tablet Take 1 tablet (100 mg total) by mouth daily. 08/12/17  Yes Deboraha Sprang, MD  amLODipine (NORVASC) 2.5 MG tablet Take 2.5 mg by mouth daily.   Yes [provider]  aspirin EC 81 MG tablet Take 81 mg by mouth daily.   Yes [provider]  cholecalciferol (VITAMIN D) 1000 units tablet Take 1,000 Units by  mouth daily.   Yes [provider]  DULoxetine (CYMBALTA) 30 MG capsule Take 30 mg by mouth daily.   Yes [provider]  ferrous sulfate 325 (65 FE) MG tablet Take 1 tablet (325 mg total) by mouth 2 (two) times daily with a meal. 10/11/19  Yes Nicole Kindred A, DO  liraglutide (VICTOZA) 18 MG/3ML SOPN Inject 0.6 mg into the skin daily. 10/11/12  Yes [provider]  loratadine (CLARITIN) 10 MG tablet Take 10 mg by mouth daily as needed for allergies.   Yes [provider]  metoprolol tartrate (LOPRESSOR) 25 MG tablet Take 1 tablet (25 mg total) by mouth 2 (two) times daily. 10/11/19  Yes Ezekiel Slocumb, DO  Multiple Vitamins-Minerals (PRESERVISION AREDS 2+MULTI VIT) CAPS Take 1 capsule by mouth 2 (two) times daily.   Yes [provider]  naloxone (NARCAN) nasal spray 4 mg/0.1 mL Place 1 spray into the nose once.   Yes [provider]  nitroGLYCERIN (NITROSTAT) 0.4 MG SL tablet Place 0.4 mg under the tongue every 5 (five) minutes as needed for chest pain.   Yes [provider]  pantoprazole (PROTONIX) 20 MG tablet Take 20 mg by mouth daily.   Yes [provider]  QUEtiapine (SEROQUEL) 25 MG tablet Take 25 mg by mouth 2 (two) times daily.   Yes [provider]  Sennosides-Docusate Sodium (SENEXON-S PO) Take by mouth. Take two tablets by mouth once daily   Yes [provider]  torsemide (DEMADEX) 20 MG tablet Take 1 tablet (20 mg total) by mouth 2 (two) times daily. 10/11/19  Yes Nicole Kindred A, DO  traMADol (ULTRAM) 50 MG tablet Take 50 mg by mouth 2 (two) times daily as needed for severe pain.   Yes [provider]  vitamin B-12 1000 MCG tablet Take 1 tablet (1,000 mcg total) by mouth daily. 10/11/19  Yes Nicole Kindred A, DO  albuterol (PROVENTIL HFA;VENTOLIN HFA) 108 (90 Base) MCG/ACT inhaler Inhale 2 puffs into the lungs every 6 (six) hours as needed for wheezing or shortness of breath.     [provider]  Blood Glucose Monitoring Suppl (ACCU-CHEK NANO SMARTVIEW) W/DEVICE KIT Please issue nano meter by AccuChek along with test strips at checking frequency of 2x daily. 90 day supply. E11.9 05/13/14   Phadke, Karsten Ro, MD  cyclobenzaprine (FLEXERIL) 5 MG tablet Take 5 mg by mouth 3 (three) times daily as needed for muscle spasms. Patient not taking: Reported on 10/19/2019    [provider]  glucose blood (FREESTYLE LITE) test strip Use as instructed 05/30/14   Rubbie Battiest, RN  ipratropium-albuterol (DUONEB) 0.5-2.5 (3) MG/3ML SOLN Take 3 mLs by nebulization.    [provider]  Lancets (FREESTYLE) lancets Use as instructed 05/30/14  Rubbie Battiest, RN  rosuvastatin (CRESTOR) 40 MG tablet Take 1 tablet (40 mg total) by mouth daily. 05/09/14   Minna Merritts, MD  traZODone (DESYREL) 100 MG tablet Take 100 mg by mouth at bedtime.    [provider]   Current Facility-Administered Medications:  .  0.9 %  sodium chloride infusion, 250 mL, Intravenous, PRN, Chotiner, Yevonne Aline, MD .  acetaminophen (TYLENOL) tablet 650 mg, 650 mg, Oral, Q4H PRN, Chotiner, Yevonne Aline, MD, 650 mg at 10/19/19 0158 .  aspirin EC tablet 81 mg, 81 mg, Oral, Daily, Chotiner, Yevonne Aline, MD, 81 mg at 10/20/19 0951 .  DULoxetine (CYMBALTA) DR capsule 30 mg, 30 mg, Oral, Daily, Chotiner, Yevonne Aline, MD, 30 mg at 10/20/19 0951 .  furosemide (LASIX) tablet 40 mg, 40 mg, Oral, BID, Agbor-Etang, Aaron Edelman, MD, 40 mg at 10/20/19 1009 .  guaiFENesin (MUCINEX) 12 hr tablet 1,200 mg, 1,200 mg, Oral, BID, Eugenie Filler, MD, 1,200 mg at 10/20/19 0951 .  insulin aspart (novoLOG) injection 0-9 Units, 0-9 Units, Subcutaneous, TID WC, Eugenie Filler, MD, 1 Units at 10/20/19 1009 .  ipratropium-albuterol (DUONEB) 0.5-2.5 (3) MG/3ML nebulizer solution 3 mL, 3 mL, Nebulization, Q4H PRN, Chotiner, Yevonne Aline, MD .  metoprolol tartrate (LOPRESSOR) tablet 25 mg, 25 mg, Oral, BID, Chotiner, Yevonne Aline, MD, 25 mg at 10/20/19 0952 .  ondansetron (ZOFRAN) injection 4 mg, 4 mg, Intravenous, Q6H PRN, Chotiner, Yevonne Aline, MD .  pantoprazole (PROTONIX) injection 40 mg, 40 mg, Intravenous, Q12H, Eugenie Filler, MD, 40 mg at 10/20/19 0952 .  QUEtiapine (SEROQUEL) tablet 25 mg, 25 mg, Oral, BID, Eugenie Filler, MD, 25 mg at 10/20/19 0951 .  rosuvastatin (CRESTOR) tablet 10 mg, 10 mg, Oral, Daily, Chotiner, Yevonne Aline, MD, 10 mg at 10/20/19 0951 .  sodium chloride flush (NS) 0.9 % injection 3 mL, 3 mL, Intravenous, Q12H, Chotiner, Yevonne Aline, MD, 3 mL at 10/20/19 1001 .  sodium chloride flush (NS) 0.9 % injection 3 mL, 3 mL, Intravenous, PRN, Chotiner, Yevonne Aline, MD .  traZODone (DESYREL) tablet 100 mg, 100 mg, Oral, QHS, Chotiner, Yevonne Aline, MD, 100 mg at 10/19/19 2316   Family History  Problem Relation Age of Onset  . Arthritis Mother   . Heart disease Mother   . Hypertension Mother   . Diabetes Mother   . Arthritis Father   . Heart disease Father   . Hypertension Father   . Cancer Sister        breast cancer  . Diabetes Brother   . Cancer Daughter        lung cancer  . Diabetes Sister      Social History   Tobacco Use  . Smoking status: Former Smoker    Quit date: 12/10/1993    Years since quitting: 25.8  . Smokeless tobacco: Former Network engineer Use Topics  . Alcohol use: Yes    Alcohol/week: 0.0 standard drinks    Comment: Rare  . Drug use: No    Allergies as of 10/18/2019 - Review Complete 10/18/2019  Allergen Reaction Noted  . Penicillins Nausea And Vomiting, Rash, and Hives 09/10/2012  . Other Other (See Comments) 04/26/2014  . Sulfa antibiotics  04/20/2014  . Penicillin g Rash 04/26/2014    Review of Systems:    All systems reviewed and negative except where noted in HPI.   Physical Exam:  Vital signs in last 24 hours: Temp:  [97.8 F (36.6 C)-99 F (37.2 C)]  97.9 F (36.6 C) (09/08 1245) Pulse Rate:  [75-135] 79 (09/08 1245) Resp:  [17-20] 18  (09/08 1245) BP: (94-129)/(47-71) 94/47 (09/08 1245) SpO2:  [96 %-100 %] 99 % (09/08 1245) Weight:  [80.2 kg-80.6 kg] 80.2 kg (09/08 0541) Last BM Date: 10/19/19 General:   Pleasant, cooperative in NAD Head:  Normocephalic and atraumatic. Eyes:   No icterus.   Conjunctiva pale. PERRLA. Ears:  Normal auditory acuity. Neck:  Supple; no masses or thyroidomegaly Lungs: Respirations even and unlabored. Lungs clear to auscultation bilaterally.   No wheezes, crackles, or rhonchi.  Heart:  Irregular rate and rhythm;  Without murmur, clicks, rubs or gallops Abdomen:  Soft, nondistended, nontender. Normal bowel sounds. No appreciable masses or hepatomegaly.  No rebound or guarding.  Rectal:  Not performed. Msk:  Symmetrical without gross deformities.  Strength generalized weakness Extremities:  Without edema, cyanosis or clubbing. Neurologic:  Alert and oriented x3;  grossly normal neurologically. Skin:  Intact without significant lesions or rashes. Psych:  Alert and cooperative. Normal affect.  LAB RESULTS: CBC Latest Ref Rng & Units 10/20/2019 10/19/2019 10/18/2019  WBC 4.0 - 10.5 K/uL 7.9 7.8 6.3  Hemoglobin 12.0 - 15.0 g/dL 7.7(L) 8.4(L) 8.7(L)  Hematocrit 36 - 46 % 25.1(L) 27.7(L) 27.5(L)  Platelets 150 - 400 K/uL 236 250 293    BMET BMP Latest Ref Rng & Units 10/20/2019 10/19/2019 10/18/2019  Glucose 70 - 99 mg/dL 144(H) 171(H) 141(H)  BUN 8 - 23 mg/dL 27(H) 37(H) 42(H)  Creatinine 0.44 - 1.00 mg/dL 1.08(H) 1.32(H) 1.49(H)  Sodium 135 - 145 mmol/L 144 145 145  Potassium 3.5 - 5.1 mmol/L 3.7 3.3(L) 3.5  Chloride 98 - 111 mmol/L 106 105 105  CO2 22 - 32 mmol/L 31 33(H) 30  Calcium 8.9 - 10.3 mg/dL 9.6 9.8 9.6    LFT Hepatic Function Latest Ref Rng & Units 10/18/2019 10/06/2019 10/02/2019  Total Protein 6.5 - 8.1 g/dL 5.8(L) 5.5(L) 6.4(L)  Albumin 3.5 - 5.0 g/dL 3.1(L) 2.9(L) 3.5  AST 15 - 41 U/L 20 16 47(H)  ALT 0 - 44 U/L 19 20 41  Alk Phosphatase 38 - 126 U/L 57 51 65  Total Bilirubin 0.3  - 1.2 mg/dL 0.6 0.9 0.8     STUDIES: DG Chest 1 View  Result Date: 10/18/2019 CLINICAL DATA:  Pt presents via acems with c/o shortness of breath and congestion. Pt has hx of CHF. Pt chronically wears 2L at home. Pt respirations currently even and unlabored EXAM: CHEST  1 VIEW COMPARISON:  10/04/2019 FINDINGS: Cardiac silhouette is mildly enlarged. Stable left anterior chest wall dual lead pacemaker. No mediastinal or hilar masses. Vascular congestion with mild interstitial prominence similar to the prior exam. Possible small pleural effusions. No lung consolidation to suggest pneumonia. No pneumothorax. Skeletal structures are demineralized but grossly intact. IMPRESSION: 1. Cardiomegaly, vascular congestion and interstitial prominence, with probable small effusions, similar to the prior study. Suspect mild congestive heart failure. No evidence of pneumonia. Electronically Signed   By: Lajean Manes M.D.   On: 10/18/2019 18:58   DG Abd 2 Views  Result Date: 10/19/2019 CLINICAL DATA:  Epigastric pain EXAM: ABDOMEN - 2 VIEW COMPARISON:  None. FINDINGS: Scattered large and small bowel gas is noted. No obstructive changes seen. No free air is noted. Diffuse vascular calcifications are noted. No renal or ureteral stones are seen. Degenerative change of lumbar spine with scoliosis concave to the right is noted. No acute abnormality noted. IMPRESSION: Chronic changes as described without acute  abnormality. Electronically Signed   By: Inez Catalina M.D.   On: 10/19/2019 11:03   DG Foot 2 Views Right  Result Date: 10/18/2019 CLINICAL DATA:  Pain and discoloration to the right great toe. Patient had a frozen pack of cheese fall on it. EXAM: RIGHT FOOT - 2 VIEW COMPARISON:  None. FINDINGS: No fracture. There is flattening of the medial aspect of the distal first metatarsal and the metatarsal head consistent with previous bunion surgery. The first metatarsophalangeal joint show significant narrowing with subchondral  sclerosis. The base of the proximal phalanx of the great toe is narrowed which may be from erosions or from previous surgery. IP joint is normally aligned. Remaining joints are normally aligned. Bones are demineralized. There is soft tissue swelling of the great toe and of the forefoot most evident dorsally. Arterial vascular calcifications extend from the ankle to the distal foot and toes. IMPRESSION: 1. No fracture or dislocation. 2. Advanced arthropathic changes at the first metatarsophalangeal joint, most likely osteoarthritis. There are postsurgical changes consistent with a previous bunionectomy. 3. Great toe and forefoot soft tissue swelling. Electronically Signed   By: Lajean Manes M.D.   On: 10/18/2019 20:24      Impression / Plan:   Jerrianne Hartin is a 86 y.o. female with metabolic syndrome, coronary disease, s/p PCI, chronic persistent A. fib, CHF, EF of 45%, CKD, diabetes presented with acute on chronic CHF and worsening anemia with no evidence of active GI bleed  Worsening anemia, known iron and B12 deficiency Patient will need upper endoscopy and colonoscopy for further evaluation after clearance by cardiology Okay to continue Protonix 40 IV twice daily Recheck iron panel, B12 levels Closely monitor CBC, to maintain hemoglobin greater than 8  Acute on chronic CHF: Improving Currently being diuresed and denies shortness of breath, A. fib rate controlled Cardiology is on board  Thank you for involving me in the care of this patient.  GI will follow along with you    LOS: 2 days   Sherri Sear, MD  10/20/2019, 2:24 PM   Note: This dictation was prepared with Dragon dictation along with smaller phrase technology. Any transcriptional errors that result from this process are unintentional.

## 2019-10-20 NOTE — Evaluation (Signed)
Physical Therapy Evaluation Patient Details Name: Evelyn Simmons MRN: [id number] DOB: 05/18/1929 Today's Date: 10/20/2019   History of Present Illness  Per MD notes: Pt is an 86 y.o. female with medical history significant for  COPD and chronic respiratory failure on home O2 at 2-3 L/min, DM , systolic and diastolic CHF, atrial fibrillation not anti-coagualted secondary to GI bleed,  history of sick sinus syndrome s/p  pacemaker, HTN, HDL, and hypothyroidism who presents for assessment of 2 days of worsening cough and shortness of breath as well as soft tissue injury to R great toe after dropping a frozen block of cheeze on it.  MD assessment includes: acute on chronic systolic heart failure, Chronic kidney disease stage III/type 2 diabetes mellitus, black stools/anemia, abdominal discomfort, A-fib s/p pacemaker, hypokalemia, hypomagnesemia, HTN, R toe pain, and anxiety.    Clinical Impression  Pt was pleasant and motivated to participate during the session.  Pt found upon entry to room on room air with pt stating that O2 was removed recently during trip to [place].  SpO2 measured at 76% on room air with canula donned at 2LO2/min and with SpO2 quickly increasing back to the low to mid 90s.  Pt's SpO2 measured frequently during the session and remained in the 90s throughout including during below therex and after amb.  Pt required only minimal assist to come to sitting from supine at the EOB but did not require physical assist with transfers or gait.  Pt demonstrated good functional strength and stability during transfers and was able to amb 80 feet without LOB using a RW.  Pt will benefit from HHPT services upon discharge to safely address deficits listed in patient problem list for decreased caregiver assistance and eventual return to PLOF.       Follow Up Recommendations Supervision - Intermittent    Equipment Recommendations  None recommended by PT    Recommendations for Other [facility] PT   (Pt participates with [facility] 2x/wk)    Precautions / Restrictions Precautions Precautions: Fall Precaution Comments: O2 sats Restrictions Weight Bearing Restrictions: No      Mobility  Bed Mobility Overal bed mobility: Needs Assistance Bed Mobility: Supine to Sit     Supine to sit: Min guard     General bed mobility comments: Min A for trunk to full upright position  Transfers Overall transfer level: Needs assistance Equipment used: Rolling walker (2 wheeled) Transfers: Sit to/from Stand Sit to Stand: Supervision         General transfer comment: Good eccentric and concentric control and stability  Ambulation/Gait Ambulation/Gait assistance: Supervision Gait Distance (Feet): 80 Feet Assistive device: Rolling walker (2 wheeled) Gait Pattern/deviations: Step-through pattern;Decreased step length - right;Decreased step length - left Gait velocity: decreased   General Gait Details: Slow cadence but steady without LOB with SpO2 remaining in the 90s on 2LO2/min and HR WNL  Stairs            Wheelchair Mobility    Modified Rankin (Stroke Patients Only)       Balance Overall balance assessment: Needs assistance   Sitting balance-Leahy Scale: Normal     Standing balance support: Bilateral upper extremity supported;During functional activity Standing balance-Leahy Scale: Good                 High Level Balance Comments: Min lean on the RW for support during ambulation but steady without LOB             Pertinent Vitals/Pain Pain Assessment:  No/denies pain    Home Living Family/patient expects to be discharged to:: Private residence Living Arrangements: Alone Available Help at Discharge: Personal care attendant;Available PRN/intermittently;Family Type of Home: Apartment Home Access: Level entry     Home Layout: One level Home Equipment: Wheelchair - manual;Cane - single point;Walker - 4 wheels;Shower seat;Walker - 2 wheels Additional  Comments: Pt is a PACE participant 2x/wk from 9:30-4pm.  Has a PCA 2x/wk for cleaning.    Prior Function Level of Independence: Needs assistance   Gait / Transfers Assistance Needed: Mod Ind amb limited community distances with a rollator, no fall history  ADL's / Homemaking Assistance Needed: Ind with bathing and dressing; daughter drives to MD apt's and run errands for patient        Hand Dominance   Dominant Hand: Right    Extremity/Trunk Assessment   Upper Extremity Assessment Upper Extremity Assessment: Generalized weakness    Lower Extremity Assessment Lower Extremity Assessment: Generalized weakness       Communication   Communication: No difficulties  Cognition Arousal/Alertness: Awake/alert Behavior During Therapy: WFL for tasks assessed/performed Overall Cognitive Status: Within Functional Limits for tasks assessed                                        General Comments      Exercises Total Joint Exercises Ankle Circles/Pumps: Strengthening;Both;10 reps Quad Sets: Strengthening;Both;10 reps Gluteal Sets: Strengthening;Both;10 reps Hip ABduction/ADduction: Strengthening;Both;5 reps Long Arc Quad: Strengthening;Both;10 reps Knee Flexion: Strengthening;Both;10 reps Marching in Standing: AROM;Both;10 reps;Standing Other Exercises Other Exercises: HEP education for BLE APs, QS, and GS x 10 each every 1-2 hours   Assessment/Plan    PT Assessment Patient needs continued PT services  PT Problem List Decreased strength;Decreased activity tolerance;Decreased balance;Decreased mobility       PT Treatment Interventions Balance training;Gait training;Functional mobility training;Therapeutic activities;Therapeutic exercise;Patient/family education    PT Goals (Current goals can be found in the Care Plan section)  Acute Rehab PT Goals Patient Stated Goal: To get stronger and get back home PT Goal Formulation: With patient Time For Goal  Achievement: 11/02/19 Potential to Achieve Goals: Good    Frequency Min 2X/week   Barriers to discharge        Co-evaluation               AM-PAC PT "6 Clicks" Mobility  Outcome Measure Help needed turning from your back to your side while in a flat bed without using bedrails?: A Little Help needed moving from lying on your back to sitting on the side of a flat bed without using bedrails?: A Little Help needed moving to and from a bed to a chair (including a wheelchair)?: A Little Help needed standing up from a chair using your arms (e.g., wheelchair or bedside chair)?: A Little Help needed to walk in hospital room?: A Little Help needed climbing 3-5 steps with a railing? : A Little 6 Click Score: 18    End of Session Equipment Utilized During Treatment: Gait belt;Oxygen Activity Tolerance: Patient tolerated treatment well Patient left: in chair;with call bell/phone within reach;with chair alarm set Nurse Communication: Mobility status;Other (comment) (Pt found on room air with SpO2 76%; quickly back to 90s when back on 2LO2/min) PT Visit Diagnosis: Muscle weakness (generalized) (M62.81);Difficulty in walking, not elsewhere classified (R26.2)    Time: 0086-7619 PT Time Calculation (min) (ACUTE ONLY): 31 min   Charges:  PT Evaluation $PT Eval Moderate Complexity: 1 Mod PT Treatments $Therapeutic Exercise: 8-22 mins        D. Scott Sandon Yoho PT, DPT 10/20/19, 10:16 AM

## 2019-10-20 NOTE — Progress Notes (Signed)
PROGRESS NOTE    Evelyn Simmons   ZOX:[id number]  DOB: 07/19/1929  PCP: Dionicia Abler, NP    DOA: 10/18/2019 LOS: 2   Brief Narrative   Evelyn Simmons is a 86 y.o. female with medical history significant for  COPD and chronic respiratory failure on home O2 at 2-3 L/min, DM , systolic and diastolic CHF, atrial fibrillation not anti-coagualted secondary to GI bleed,  history of sick sinus syndrome s/p  pacemaker, HTN, HDL, and hypothyroidism who presents for assessment of 2 days of worsening cough and shortness of breath especially with ambulation.  Of note, patient admitted for decompensated CHF from 8/23 to 8/30.  Discharged on torsemide.  SNF was recommended for short term rehab, but patient declined as her son would be coming to stay with her for as long as needed.  Evaluation in the ED revealed pulmonary vascular congestion on chest xray and elevated BNP.  No fever, leukocytosis or focal infiltrate om xray to suggest infection.  Admitted to hospitalist service for further management of decompensated CHF.     Assessment & Plan   Principal Problem:   Acute on chronic systolic CHF (congestive heart failure) (HCC) Active Problems:   CKD stage 3 due to type 2 diabetes mellitus (HCC)   Pacemaker   CAD (coronary artery disease)   Persistent atrial fibrillation (HCC)   Diabetes mellitus type 2 in obese (HCC)   Anemia due to chronic kidney disease   COPD (chronic obstructive pulmonary disease) (HCC)   Toe pain, right   Acute Anemia superimposed on anemia of CKD Melena / Anemia - recurrent melena at home.  FOBT positive this admission.  Hbg slightly downtrending, 7.7 this AM. Likely contributes to patient's SOB.  Pt has known hx of diverticulosis/itis. --Trend H&H, transfuse in Hbg <7 --GI consulted, plan for EGD and colonoscopy --Continue PPI.    Acute on chronic systolic CHF - Cardiology following.  On PO Lasix 40 mg BID.  Was discharged on torsemide BID, but feels Lasix works  better for her.   --Continue Lasix, Lopressor --recurrent admissions for CHF decompensation --continue to educate on diet and fluid restriction, daily wt  Persistent atrial fibrillation - not on anticoagulation due to anemia and history of GI bleeding.   Rate is controlled.  Continue Lopressor.  CHA2DS2VASC score 6.  Acquired Thrombophilia - a-fib placed patient at increased risk of clotting and stroke.  Anticoagulation currently contraindicated due to anemia and concern for GI bleed.  CKD stage 3b - due to diabetes.  Baseline Cr around 1.2-1.3. Monitor BMP.  Renally dose meds as indicated.  Coronary artery disease - stable, no chest pain.  Continue ASA, metoprolol, statin  Diabetes mellitus type 2 - sliding scale Novolog  COPD - not acutely exacerbated.  Continue bronchodilators.  Toe pain, right - present on admission, pt dropped something on her foot at home just prior to coming in.  Monitor.  PT evaluation.  Hyperlipidemia - on Crestor, continued.  Obesity: Body mass index is 35.71 kg/m.  Complicates overall care and prognosis.   DVT prophylaxis: Place and maintain sequential compression device Start: 10/20/19 1150 SCDs Start: 10/18/19 2132   Diet:  Diet Orders (From admission, onward)    Start     Ordered   10/20/19 1510  Diet clear liquid Room service appropriate? Yes; Fluid consistency: Thin  Diet effective now       Question Answer Comment  Room service appropriate? Yes   Fluid consistency: Thin      10/20/19  1510            Code Status: DNR    Subjective 10/20/19    Patient seen up in chair this AM.  Reports she feels well.  Still having dark loose stool.  Occasionally has LLQ pain with BM's that resolves afterward.  Denies CP, SOB, N/V or other complaints.   Disposition Plan & Communication   Status is: Inpatient  Remains appropriate for inpatient care due to ongoing diagnostic evaluation no appropriate for outpatient setting, worsening  anemia.  Dispo: The patient is from: home w [facility]              Anticipated d/c is to: home w [facility]              Anticipated d/c date is: 2-3 days              Patient currently is not medically stable for discharge.     Family Communication: none at bedside, will attempt to call   Consults, Procedures, Significant Events   Consultants:   Cardiology  Gastroenterology  Procedures:     Antimicrobials:   None     Objective   Vitals:   10/20/19 0803 10/20/19 1245 10/20/19 1605 10/20/19 2016  BP: 117/61 (!) 94/47 (!) 91/51 (!) 111/57  Pulse: 75 79 68 66  Resp: 18 18  17   Temp: 98.5 F (36.9 C) 97.9 F (36.6 C) 97.7 F (36.5 C) 98.6 F (37 C)  TempSrc: Oral  Oral   SpO2: 97% 99% 97% 100%  Weight:      Height:        Intake/Output Summary (Last 24 hours) at 10/20/2019 2210 Last data filed at 10/20/2019 0500 Gross per 24 hour  Intake --  Output 150 ml  Net -150 ml   Filed Weights   10/18/19 1840 10/19/19 2330 10/20/19 0541  Weight: 79.4 kg 80.6 kg 80.2 kg    Physical Exam:  General exam: awake, alert, no acute distress, obese Respiratory system: CTAB with diminished bases, no wheezes or rhonchi, normal respiratory effort. Cardiovascular system: normal S1/S2, RRR, no JVD, no pedal edema.   Gastrointestinal system: soft, NT, ND, +bowel sounds. Central nervous system: A&O x3. no gross focal neurologic deficits, normal speech Extremities: moves all, no cyanosis, normal tone Psychiatry: normal mood, congruent affect, judgement and insight appear normal  Labs   Data Reviewed: I have personally reviewed following labs and imaging studies  CBC: Recent Labs  Lab 10/18/19 1843 10/19/19 1454 10/20/19 0532  WBC 6.3 7.8 7.9  NEUTROABS 4.1  --  6.2  HGB 8.7* 8.4* 7.7*  HCT 27.5* 27.7* 25.1*  MCV 93.9 97.2 96.9  PLT 293 250 284   Basic Metabolic Panel: Recent Labs  Lab 10/18/19 1843 10/19/19 0634 10/20/19 0532  NA 145 145 144  K 3.5 3.3* 3.7  CL 105 105 106   CO2 30 33* 31  GLUCOSE 141* 171* 144*  BUN 42* 37* 27*  CREATININE 1.49* 1.32* 1.08*  CALCIUM 9.6 9.8 9.6  MG 1.9 1.6* 2.2   GFR: Estimated Creatinine Clearance: 33 mL/min (A) (by C-G formula based on SCr of 1.08 mg/dL (H)). Liver Function Tests: Recent Labs  Lab 10/18/19 1843  AST 20  ALT 19  ALKPHOS 57  BILITOT 0.6  PROT 5.8*  ALBUMIN 3.1*   No results for input(s): LIPASE, AMYLASE in the last 168 hours. No results for input(s): AMMONIA in the last 168 hours. Coagulation Profile: No results for input(s): INR, PROTIME  in the last 168 hours. Cardiac Enzymes: No results for input(s): CKTOTAL, CKMB, CKMBINDEX, TROPONINI in the last 168 hours. BNP (last 3 results) No results for input(s): PROBNP in the last 8760 hours. HbA1C: No results for input(s): HGBA1C in the last 72 hours. CBG: Recent Labs  Lab 10/19/19 2314 10/20/19 0804 10/20/19 1730  GLUCAP 162* 128* 107*   Lipid Profile: No results for input(s): CHOL, HDL, LDLCALC, TRIG, CHOLHDL, LDLDIRECT in the last 72 hours. Thyroid Function Tests: No results for input(s): TSH, T4TOTAL, FREET4, T3FREE, THYROIDAB in the last 72 hours. Anemia Panel: Recent Labs    10/20/19 0532 10/20/19 1333  VITAMINB12  --  402  FERRITIN 29  --   TIBC 266  --   IRON 17*  --    Sepsis Labs: Recent Labs  Lab 10/18/19 1843  PROCALCITON <0.10    Recent Results (from the past 240 hour(s))  SARS Coronavirus 2 by RT PCR (hospital order, performed in [facility] hospital lab) Nasopharyngeal Nasopharyngeal Swab     Status: None   Collection Time: 10/18/19  6:43 PM   Specimen: Nasopharyngeal Swab  Result Value Ref Range Status   SARS Coronavirus 2 NEGATIVE NEGATIVE Final    Comment: (NOTE) SARS-CoV-2 target nucleic acids are NOT DETECTED.  The SARS-CoV-2 RNA is generally detectable in upper and lower respiratory specimens during the acute phase of infection. The lowest concentration of SARS-CoV-2 viral copies this assay can  detect is 250 copies / mL. A negative result does not preclude SARS-CoV-2 infection and should not be used as the sole basis for treatment or other patient management decisions.  A negative result may occur with improper specimen collection / handling, submission of specimen other than nasopharyngeal swab, presence of viral mutation(s) within the areas targeted by this assay, and inadequate number of viral copies (<250 copies / mL). A negative result must be combined with clinical observations, patient history, and epidemiological information.  Fact Sheet for Patients:   StrictlyIdeas.no  Fact Sheet for Healthcare Providers: BankingDealers.co.za  This test is not yet approved or  cleared by the [place] FDA and has been authorized for detection and/or diagnosis of SARS-CoV-2 by FDA under an Emergency Use Authorization (EUA).  This EUA will remain in effect (meaning this test can be used) for the duration of the COVID-19 declaration under Section 564(b)(1) of the Act, 21 U.S.C. section 360bbb-3(b)(1), unless the authorization is terminated or revoked sooner.  Performed at [facility], [address] [place], [place] [address]       Imaging Studies   DG Abd 2 Views  Result Date: 10/19/2019 CLINICAL DATA:  Epigastric pain EXAM: ABDOMEN - 2 VIEW COMPARISON:  None. FINDINGS: Scattered large and small bowel gas is noted. No obstructive changes seen. No free air is noted. Diffuse vascular calcifications are noted. No renal or ureteral stones are seen. Degenerative change of lumbar spine with scoliosis concave to the right is noted. No acute abnormality noted. IMPRESSION: Chronic changes as described without acute abnormality. Electronically Signed   By: Inez Catalina M.D.   On: 10/19/2019 11:03     Medications   Scheduled Meds: . aspirin EC  81 mg Oral Daily  . DULoxetine  30 mg Oral Daily  . furosemide  40 mg Oral BID   . guaiFENesin  1,200 mg Oral BID  . insulin aspart  0-9 Units Subcutaneous TID WC  . magnesium citrate  1 Bottle Oral Once  . metoprolol tartrate  25 mg Oral  BID  . pantoprazole (PROTONIX) IV  40 mg Intravenous Q12H  . QUEtiapine  25 mg Oral BID  . rosuvastatin  10 mg Oral Daily  . sodium chloride flush  3 mL Intravenous Q12H  . traZODone  100 mg Oral QHS   Continuous Infusions: . sodium chloride         LOS: 2 days    Time spent: 30 minutes    Ezekiel Slocumb, DO [facility]  10/20/2019, 10:10 PM    If 7PM-7AM, please contact night-coverage. How to contact the [facility] Attending or Consulting provider [place] or covering provider during after hours [place], for this patient?    1. Check the care team in [facility] and look for a) attending/consulting TRH provider listed and b) the [facility] team listed 2. Log into [email or web address] and use [place] universal password to access. If you do not have the password, please contact the hospital operator. 3. Locate the [facility] provider you are looking for under [facility] and page to a number that you can be directly reached. 4. If you still have difficulty reaching the provider, please page the [facility] (Director on Call) for the Hospitalists listed on amion for assistance.

## 2019-10-21 ENCOUNTER — Encounter
Admission: EM | Disposition: A | Discharge status: Home Health | Payer: Self-pay | Source: Home / Self Care | Attending: Internal Medicine

## 2019-10-21 ENCOUNTER — Inpatient Hospital Stay: Payer: Medicare (Managed Care) | Admitting: Anesthesiology

## 2019-10-21 ENCOUNTER — Encounter: Payer: Self-pay | Admitting: Family Medicine

## 2019-10-21 DIAGNOSIS — Z950 Presence of cardiac pacemaker: Secondary | ICD-10-CM

## 2019-10-21 DIAGNOSIS — D631 Anemia in chronic kidney disease: Secondary | ICD-10-CM

## 2019-10-21 DIAGNOSIS — N1832 Chronic kidney disease, stage 3b: Secondary | ICD-10-CM

## 2019-10-21 DIAGNOSIS — E669 Obesity, unspecified: Secondary | ICD-10-CM

## 2019-10-21 DIAGNOSIS — J449 Chronic obstructive pulmonary disease, unspecified: Secondary | ICD-10-CM

## 2019-10-21 DIAGNOSIS — J810 Acute pulmonary edema: Secondary | ICD-10-CM

## 2019-10-21 DIAGNOSIS — E1169 Type 2 diabetes mellitus with other specified complication: Secondary | ICD-10-CM

## 2019-10-21 HISTORY — PX: COLONOSCOPY WITH PROPOFOL: SHX5780

## 2019-10-21 HISTORY — PX: ESOPHAGOGASTRODUODENOSCOPY (EGD) WITH PROPOFOL: SHX5813

## 2019-10-21 LAB — BASIC METABOLIC PANEL
Anion gap: 8 (ref 5–15)
BUN: 29 mg/dL — ABNORMAL HIGH (ref 8–23)
CO2: 29 mmol/L (ref 22–32)
Calcium: 9.9 mg/dL (ref 8.9–10.3)
Chloride: 104 mmol/L (ref 98–111)
Creatinine, Ser: 1.54 mg/dL — ABNORMAL HIGH (ref 0.44–1.00)
GFR calc Af Amer: 35 mL/min — ABNORMAL LOW (ref >60)
GFR calc non Af Amer: 30 mL/min — ABNORMAL LOW (ref >60)
Glucose, Bld: 114 mg/dL — ABNORMAL HIGH (ref 70–99)
Potassium: 4.2 mmol/L (ref 3.5–5.1)
Sodium: 141 mmol/L (ref 135–145)

## 2019-10-21 LAB — CBC
HCT: 28.4 % — ABNORMAL LOW (ref 36.0–46.0)
Hemoglobin: 8.4 g/dL — ABNORMAL LOW (ref 12.0–15.0)
MCH: 29.4 pg (ref 26.0–34.0)
MCHC: 29.6 g/dL — ABNORMAL LOW (ref 30.0–36.0)
MCV: 99.3 fL (ref 80.0–100.0)
Platelets: 252 10*3/uL (ref 150–400)
RBC: 2.86 MIL/uL — ABNORMAL LOW (ref 3.87–5.11)
RDW: 16.9 % — ABNORMAL HIGH (ref 11.5–15.5)
WBC: 5.5 10*3/uL (ref 4.0–10.5)
nRBC: 0 % (ref 0.0–0.2)

## 2019-10-21 LAB — GLUCOSE, CAPILLARY
Glucose-Capillary: 105 mg/dL — ABNORMAL HIGH (ref 70–99)
Glucose-Capillary: 91 mg/dL (ref 70–99)
Glucose-Capillary: 117 mg/dL — ABNORMAL HIGH (ref 70–99)
Glucose-Capillary: 174 mg/dL — ABNORMAL HIGH (ref 70–99)

## 2019-10-21 LAB — MAGNESIUM: Magnesium: 2.4 mg/dL (ref 1.7–2.4)

## 2019-10-21 SURGERY — ESOPHAGOGASTRODUODENOSCOPY (EGD) WITH PROPOFOL
Anesthesia: General

## 2019-10-21 MED ORDER — LIDOCAINE HCL (CARDIAC) PF 100 MG/5ML IV SOSY
PREFILLED_SYRINGE | INTRAVENOUS | Status: DC | PRN
  Administered 2019-10-21: 50 mg via INTRAVENOUS

## 2019-10-21 MED ORDER — PROPOFOL 10 MG/ML IV BOLUS
INTRAVENOUS | Status: DC | PRN
  Administered 2019-10-21: 40 ug/kg/min via INTRAVENOUS
  Administered 2019-10-21: 40 mg via INTRAVENOUS

## 2019-10-21 MED ORDER — PANTOPRAZOLE SODIUM 40 MG PO TBEC
40.0000 mg | DELAYED_RELEASE_TABLET | Freq: Every day | ORAL | Status: DC
  Administered 2019-10-21 – 2019-10-23 (×3): 40 mg via ORAL
  Filled 2019-10-21 (×3): qty 1

## 2019-10-21 MED ORDER — EPHEDRINE SULFATE 50 MG/ML IJ SOLN
INTRAMUSCULAR | Status: DC | PRN
  Administered 2019-10-21 (×2): 5 mg via INTRAVENOUS

## 2019-10-21 MED ORDER — METOPROLOL TARTRATE 25 MG PO TABS
12.5000 mg | ORAL_TABLET | Freq: Two times a day (BID) | ORAL | Status: DC
  Administered 2019-10-21 – 2019-10-23 (×3): 12.5 mg via ORAL
  Filled 2019-10-21 (×3): qty 1

## 2019-10-21 MED ORDER — SODIUM CHLORIDE 0.9 % IV SOLN: INTRAVENOUS | Status: DC

## 2019-10-21 MED ORDER — SODIUM CHLORIDE 0.9 % IV SOLN
300.0000 mg | Freq: Once | INTRAVENOUS | Status: AC
  Administered 2019-10-21: 300 mg via INTRAVENOUS
  Filled 2019-10-21: qty 15

## 2019-10-21 MED ORDER — PHENYLEPHRINE HCL (PRESSORS) 10 MG/ML IV SOLN
INTRAVENOUS | Status: DC | PRN
  Administered 2019-10-21 (×5): 200 ug via INTRAVENOUS

## 2019-10-21 NOTE — Progress Notes (Signed)
PROGRESS NOTE    Evelyn Simmons   [id number]  DOB: 1929/11/03  PCP: Dionicia Abler, NP    DOA: 10/18/2019 LOS: 3   Brief Narrative   Evelyn Simmons is a 86 y.o. female with medical history significant for  COPD and chronic respiratory failure on home O2 at 2-3 L/min, DM , systolic and diastolic CHF, atrial fibrillation not anti-coagualted secondary to GI bleed,  history of sick sinus syndrome s/p  pacemaker, HTN, HDL, and hypothyroidism who presents for assessment of 2 days of worsening cough and shortness of breath especially with ambulation.  Of note, patient admitted for decompensated CHF from 8/23 to 8/30.  Discharged on torsemide.  SNF was recommended for short term rehab, but patient declined as her son would be coming to stay with her for as long as needed.  Evaluation in the ED revealed pulmonary vascular congestion on chest xray and elevated BNP.  No fever, leukocytosis or focal infiltrate om xray to suggest infection.  Admitted to hospitalist service for further management of decompensated CHF.     Assessment & Plan   Principal Problem:   Acute on chronic systolic CHF (congestive heart failure) (HCC) Active Problems:   CKD stage 3 due to type 2 diabetes mellitus (HCC)   Pacemaker   CAD (coronary artery disease)   Persistent atrial fibrillation (HCC)   Diabetes mellitus type 2 in obese (HCC)   Anemia due to chronic kidney disease   COPD (chronic obstructive pulmonary disease) (HCC)   Toe pain, right   Acute Anemia superimposed on anemia of CKD Melena / Anemia - recurrent melena at home.  FOBT positive this admission.  Hbg slightly downtrending, 7.7 this AM. Likely contributes to patient's SOB.  Pt has known hx of diverticulosis/itis. EGD 9/9 - erythematous gastric mucosa, medium hiatal hernia. Colonoscopy 9/9 - multiple polyps were removed, diverticulosis of recto-sigmoid colon, non-bleeding external hemorrhoids --Trend H&H, transfuse in Hbg <7 --GI  consulted --Follow up with GI in 4 weeks, likely to do capsule endoscopy --Change IV to home oral PPI    Acute on chronic systolic CHF - Cardiology following.  On PO Lasix 40 mg BID.  Was discharged on torsemide BID, but feels Lasix works better for her.   --Continue Lasix, Lopressor --recurrent admissions for CHF decompensation --continue to educate on diet and fluid restriction, daily wt  Generalized weakness / Deconditioning / failure to thrive - patient likely needs full time help at home, lives alone and has frequent hospital admissions.  Quite weak overall due to anemia, obesity, and chronic conditions.  PT following.  Last admission, SNF was recommended but patient declined, son was to stay with her.  Recommend she consider ALF.  Persistent atrial fibrillation - not on anticoagulation due to anemia and history of GI bleeding.   Rate is controlled.  Continue Lopressor.  CHA2DS2VASC score 6.  Acquired Thrombophilia - a-fib placed patient at increased risk of clotting and stroke.  Anticoagulation currently contraindicated due to anemia and concern for GI bleed.  AKI - due to diuresis vs hypovolemia from bowel prep & NPO status for endoscopies.  Cr bumped to 1.5 today.  Monitor BMP.    CKD stage 3b - due to diabetes.  Baseline Cr around 1.2-1.3. Monitor BMP.  Renally dose meds as indicated.  Coronary artery disease - stable, no chest pain.  Continue ASA, metoprolol, statin  Diabetes mellitus type 2 - sliding scale Novolog  COPD - not acutely exacerbated.  Continue bronchodilators.  Toe pain, right -  present on admission, pt dropped something on her foot at home just prior to coming in.  Monitor.  PT evaluation.  Hyperlipidemia - on Crestor, continued.  Obesity: Body mass index is 36.25 kg/m.  Complicates overall care and prognosis.   DVT prophylaxis: Place and maintain sequential compression device Start: 10/20/19 1150 SCDs Start: 10/18/19 2132   Diet:  Diet Orders (From  admission, onward)    Start     Ordered   10/20/19 1510  Diet clear liquid Room service appropriate? Yes; Fluid consistency: Thin  Diet effective now       Question Answer Comment  Room service appropriate? Yes   Fluid consistency: Thin      10/20/19 1510            Code Status: DNR    Subjective 10/21/19    Patient seen at bedside, sleeping soundly but awoke to voice.  Says she did not sleep much last night, tired today.  Denies trouble breathing, chest pain, abdominal pain, N/V/D or noticing any bloody/dark stool during bowel prep.   Disposition Plan & Communication   Status is: Inpatient  Remains appropriate for inpatient care due to severity of illness, anemia requiring monitoring, now with AKI.  Dispo: The patient is from: home w [facility]              Anticipated d/c is to: home w [facility]              Anticipated d/c date is: 1-2 days              Patient currently is not medically stable for discharge.     Family Communication: none at bedside, will attempt to call   Consults, Procedures, Significant Events   Consultants:   Cardiology  Gastroenterology  Procedures:   EGD and Colonoscopy on 9/9  Antimicrobials:   None     Objective   Vitals:   10/20/19 2016 10/21/19 0503 10/21/19 0533 10/21/19 0736  BP: (!) 111/57 (!) 102/48  (!) 101/43  Pulse: 66 72  69  Resp: 17 19  18   Temp: 98.6 F (37 C)   97.9 F (36.6 C)  TempSrc:      SpO2: 100% 99%  98%  Weight:   81.4 kg   Height:        Intake/Output Summary (Last 24 hours) at 10/21/2019 0912 Last data filed at 10/21/2019 0428 Gross per 24 hour  Intake --  Output 4 ml  Net -4 ml   Filed Weights   10/19/19 2330 10/20/19 0541 10/21/19 0533  Weight: 80.6 kg 80.2 kg 81.4 kg    Physical Exam:  General exam: awake, alert, no acute distress, obese Respiratory system: CTAB with diminished bases, no wheezes or rhonchi, normal respiratory effort. Cardiovascular system: normal S1/S2, RRR, no JVD, no pedal  edema.   Gastrointestinal system: soft, NT, ND, +bowel sounds. Central nervous system: A&O x3. no gross focal neurologic deficits, normal speech    Labs   Data Reviewed: I have personally reviewed following labs and imaging studies  CBC: Recent Labs  Lab 10/18/19 1843 10/19/19 1454 10/20/19 0532 10/21/19 0508  WBC 6.3 7.8 7.9 5.5  NEUTROABS 4.1  --  6.2  --   HGB 8.7* 8.4* 7.7* 8.4*  HCT 27.5* 27.7* 25.1* 28.4*  MCV 93.9 97.2 96.9 99.3  PLT 293 250 236 269   Basic Metabolic Panel: Recent Labs  Lab 10/18/19 1843 10/19/19 0634 10/20/19 0532 10/21/19 0508  NA 145 145 144  141  K 3.5 3.3* 3.7 4.2  CL 105 105 106 104  CO2 30 33* 31 29  GLUCOSE 141* 171* 144* 114*  BUN 42* 37* 27* 29*  CREATININE 1.49* 1.32* 1.08* 1.54*  CALCIUM 9.6 9.8 9.6 9.9  MG 1.9 1.6* 2.2 2.4   GFR: Estimated Creatinine Clearance: 23.3 mL/min (A) (by C-G formula based on SCr of 1.54 mg/dL (H)). Liver Function Tests: Recent Labs  Lab 10/18/19 1843  AST 20  ALT 19  ALKPHOS 57  BILITOT 0.6  PROT 5.8*  ALBUMIN 3.1*   No results for input(s): LIPASE, AMYLASE in the last 168 hours. No results for input(s): AMMONIA in the last 168 hours. Coagulation Profile: No results for input(s): INR, PROTIME in the last 168 hours. Cardiac Enzymes: No results for input(s): CKTOTAL, CKMB, CKMBINDEX, TROPONINI in the last 168 hours. BNP (last 3 results) No results for input(s): PROBNP in the last 8760 hours. HbA1C: No results for input(s): HGBA1C in the last 72 hours. CBG: Recent Labs  Lab 10/19/19 2314 10/20/19 0804 10/20/19 1730 10/20/19 2230 10/21/19 0733  GLUCAP 162* 128* 107* 191* 105*   Lipid Profile: No results for input(s): CHOL, HDL, LDLCALC, TRIG, CHOLHDL, LDLDIRECT in the last 72 hours. Thyroid Function Tests: No results for input(s): TSH, T4TOTAL, FREET4, T3FREE, THYROIDAB in the last 72 hours. Anemia Panel: Recent Labs    10/20/19 0532 10/20/19 1333  VITAMINB12  --  402   FERRITIN 29  --   TIBC 266  --   IRON 17*  --    Sepsis Labs: Recent Labs  Lab 10/18/19 1843  PROCALCITON <0.10    Recent Results (from the past 240 hour(s))  SARS Coronavirus 2 by RT PCR (hospital order, performed in [facility] hospital lab) Nasopharyngeal Nasopharyngeal Swab     Status: None   Collection Time: 10/18/19  6:43 PM   Specimen: Nasopharyngeal Swab  Result Value Ref Range Status   SARS Coronavirus 2 NEGATIVE NEGATIVE Final    Comment: (NOTE) SARS-CoV-2 target nucleic acids are NOT DETECTED.  The SARS-CoV-2 RNA is generally detectable in upper and lower respiratory specimens during the acute phase of infection. The lowest concentration of SARS-CoV-2 viral copies this assay can detect is 250 copies / mL. A negative result does not preclude SARS-CoV-2 infection and should not be used as the sole basis for treatment or other patient management decisions.  A negative result may occur with improper specimen collection / handling, submission of specimen other than nasopharyngeal swab, presence of viral mutation(s) within the areas targeted by this assay, and inadequate number of viral copies (<250 copies / mL). A negative result must be combined with clinical observations, patient history, and epidemiological information.  Fact Sheet for Patients:   StrictlyIdeas.no  Fact Sheet for Healthcare Providers: BankingDealers.co.za  This test is not yet approved or  cleared by the [place] FDA and has been authorized for detection and/or diagnosis of SARS-CoV-2 by FDA under an Emergency Use Authorization (EUA).  This EUA will remain in effect (meaning this test can be used) for the duration of the COVID-19 declaration under Section 564(b)(1) of the Act, 21 U.S.C. section 360bbb-3(b)(1), unless the authorization is terminated or revoked sooner.  Performed at [facility], [address] [place], [place]  [address]       Imaging Studies   DG Abd 2 Views  Result Date: 10/19/2019 CLINICAL DATA:  Epigastric pain EXAM: ABDOMEN - 2 VIEW COMPARISON:  None. FINDINGS: Scattered large and small  bowel gas is noted. No obstructive changes seen. No free air is noted. Diffuse vascular calcifications are noted. No renal or ureteral stones are seen. Degenerative change of lumbar spine with scoliosis concave to the right is noted. No acute abnormality noted. IMPRESSION: Chronic changes as described without acute abnormality. Electronically Signed   By: Inez Catalina M.D.   On: 10/19/2019 11:03     Medications   Scheduled Meds: . aspirin EC  81 mg Oral Daily  . DULoxetine  30 mg Oral Daily  . furosemide  40 mg Oral BID  . guaiFENesin  1,200 mg Oral BID  . insulin aspart  0-9 Units Subcutaneous TID WC  . metoprolol tartrate  25 mg Oral BID  . pantoprazole (PROTONIX) IV  40 mg Intravenous Q12H  . QUEtiapine  25 mg Oral BID  . rosuvastatin  10 mg Oral Daily  . sodium chloride flush  3 mL Intravenous Q12H  . traZODone  100 mg Oral QHS   Continuous Infusions: . sodium chloride         LOS: 3 days    Time spent: 28 minutes with > 50% spent in coordination of care and direct patient contact.    Ezekiel Slocumb, DO [facility]  10/21/2019, 9:12 AM    If 7PM-7AM, please contact night-coverage. How to contact the [facility] Attending or Consulting provider [place] or covering provider during after hours [place], for this patient?    1. Check the care team in [facility] and look for a) attending/consulting TRH provider listed and b) the [facility] team listed 2. Log into [email or web address] and use 's universal password to access. If you do not have the password, please contact the hospital operator. 3. Locate the [facility] provider you are looking for under [facility] and page to a number that you can be directly reached. 4. If you still have difficulty reaching the provider, please page the [facility] (Director  on Call) for the Hospitalists listed on amion for assistance.

## 2019-10-21 NOTE — Anesthesia Preprocedure Evaluation (Addendum)
Anesthesia Evaluation  Patient identified by MRN, date of birth, ID band Patient awake    Reviewed: Allergy & Precautions, H&P , NPO status , reviewed documented beta blocker date and time   Airway Mallampati: II  TM Distance: >3 FB Neck ROM: limited    Dental  (+) Upper Dentures, Lower Dentures   Pulmonary asthma , COPD,  oxygen dependent, former smoker,    Pulmonary exam normal        Cardiovascular hypertension, + CAD, + Past MI and +CHF  Normal cardiovascular exam+ dysrhythmias + pacemaker   09/2019 ECHO IMPRESSIONS    1. Left ventricular ejection fraction, by estimation, is 45 to 50%. The  left ventricle has mildly decreased function. The left ventricle  demonstrates global hypokinesis. There is mild concentric left ventricular  hypertrophy. Left ventricular diastolic  parameters are indeterminate. Elevated left ventricular end-diastolic  pressure.  2. Right ventricular systolic function is mildly reduced. The right  ventricular size is normal. There is mildly elevated pulmonary artery  systolic pressure.  3. Left atrial size was severely dilated.  4. Right atrial size was mildly dilated.  5. The mitral valve is normal in structure. Mild mitral valve  regurgitation. No evidence of mitral stenosis.  6. The aortic valve is tricuspid. Aortic valve regurgitation is not  visualized. No aortic stenosis is present.  7. The inferior vena cava is normal in size with <50% respiratory  variability, suggesting right atrial pressure of 8 mmHg.    Neuro/Psych PSYCHIATRIC DISORDERS Depression  Neuromuscular disease    GI/Hepatic GERD  Controlled,  Endo/Other  diabetesHypothyroidism   Renal/GU Renal disease     Musculoskeletal  (+) Arthritis ,   Abdominal   Peds  Hematology  (+) Blood dyscrasia, anemia ,   Anesthesia Other Findings Past Medical History: No date: (HFmrEF) heart failure with mid-range ejection  fraction ([place])     Comment:  a. 09/2019 Echo: EF 45-50%, glob HK, mild conc LVH,               mildly elev PASP. Sev dil LA. Mildly dil RA. Mild MR. No date: Anemia No date: Arthritis No date: Asthma No date: CAD (coronary artery disease)     Comment:  a. 1995 s/p PCI/BMS LAD; b. 2011 s/p PCI/DES RCA. No date: CHF (congestive heart failure) (HCC) No date: Chronic kidney disease No date: COPD (chronic obstructive pulmonary disease) (HCC) No date: Depression No date: Diabetes mellitus without complication (HCC) No date: Diverticulitis No date: Dysrhythmia No date: GERD (gastroesophageal reflux disease) No date: Hyperlipidemia No date: Hypertension No date: Hypothyroid     Comment:  pt unsure, not currently on medication No date: Ischemic cardiomyopathy No date: MI (myocardial infarction) ([place])     Comment:  1995 No date: Persistent atrial fibrillation ([place])     Comment:  a. No OAC 2/2 h/o bleeding/anemia; b. Longterm amio               therapy; c. 08/2019 - recurrent afib-->rate-controlled. No date: Presence of permanent cardiac pacemaker No date: Radiculopathy  Past Surgical History: No date: ABDOMINAL HYSTERECTOMY No date: APPENDECTOMY No date: CARDIAC CATHETERIZATION 12/11/1993: CORONARY ANGIOPLASTY WITH STENT PLACEMENT     Comment:  RCA No date: FOOT SURGERY; Bilateral     Comment:  Over 25 years ago- Bunionectomy 09/2012: INSERT / REPLACE / REMOVE PACEMAKER     Comment:  Model # [id number]  serial # [id number] No date: JOINT REPLACEMENT     Comment:  bilateral knee replacements  BMI    Body Mass Index: 36.25 kg/m      Reproductive/Obstetrics                          Anesthesia Physical Anesthesia Plan  ASA: IV  Anesthesia Plan: General   Post-op Pain Management:    Induction: Intravenous  PONV Risk Score and Plan: Treatment may vary due to age or medical condition and TIVA  Airway Management Planned: Nasal Cannula and Natural  Airway  Additional Equipment:   Intra-op Plan:   Post-operative Plan:   Informed Consent: I have reviewed the patients History and Physical, chart, labs and discussed the procedure including the risks, benefits and alternatives for the proposed anesthesia with the patient or authorized representative who has indicated his/her understanding and acceptance.     Dental Advisory Given  Plan Discussed with: CRNA  Anesthesia Plan Comments:         Anesthesia Quick Evaluation

## 2019-10-21 NOTE — Op Note (Signed)
[facility] Patient Name: Patricie Geeslin Procedure Date: 10/21/2019 1:05 PM MRN: [id number] Account #: [id number] Date of Birth: 1929-10-12 Admit Type: Inpatient Age: 86 Room: [facility] ENDO ROOM 3 Gender: Female Note Status: Finalized Procedure:             Colonoscopy Indications:           Melena, Unexplained iron deficiency anemia Providers:             Lin Landsman MD, MD Referring MD:          No Local Md, MD (Referring MD) Medicines:             Monitored Anesthesia Care Complications:         No immediate complications. Estimated blood loss:                         Minimal. Procedure:             Pre-Anesthesia Assessment:                        - Prior to the procedure, a History and Physical was                         performed, and patient medications and allergies were                         reviewed. The patient is competent. The risks and                         benefits of the procedure and the sedation options and                         risks were discussed with the patient. All questions                         were answered and informed consent was obtained.                         Patient identification and proposed procedure were                         verified by the physician, the nurse, the                         anesthesiologist, the anesthetist and the technician                         in the pre-procedure area in the procedure room in the                         endoscopy suite. Mental Status Examination: alert and                         oriented. Airway Examination: normal oropharyngeal                         airway and neck mobility. Respiratory Examination:  clear to auscultation. CV Examination: irregularly                         irregular rate and rhythm. Prophylactic Antibiotics:                         The patient does not require prophylactic antibiotics.                         Prior  Anticoagulants: The patient has taken no                         previous anticoagulant or antiplatelet agents. ASA                         Grade Assessment: IV - A patient with severe systemic                         disease that is a constant threat to life. After                         reviewing the risks and benefits, the patient was                         deemed in satisfactory condition to undergo the                         procedure. The anesthesia plan was to use monitored                         anesthesia care (MAC). Immediately prior to                         administration of medications, the patient was                         re-assessed for adequacy to receive sedatives. The                         heart rate, respiratory rate, oxygen saturations,                         blood pressure, adequacy of pulmonary ventilation, and                         response to care were monitored throughout the                         procedure. The physical status of the patient was                         re-assessed after the procedure.                        After obtaining informed consent, the colonoscope was                         passed under direct vision. Throughout the procedure,  the patient's blood pressure, pulse, and oxygen                         saturations were monitored continuously. The                         Colonoscope was introduced through the anus and                         advanced to the the cecum, identified by appendiceal                         orifice and ileocecal valve. The colonoscopy was                         extremely difficult due to multiple diverticula in the                         colon. Successful completion of the procedure was                         aided by withdrawing the scope and replacing with the                         pediatric colonoscope. The patient tolerated the                         procedure fairly  well. The quality of the bowel                         preparation was evaluated using the BBPS [facility] Bowel                         Preparation Scale) with scores of: Right Colon = 3,                         Transverse Colon = 3 and Left Colon = 3 (entire mucosa                         seen well with no residual staining, small fragments                         of stool or opaque liquid). The total BBPS score                         equals 9. Findings:      The perianal and digital rectal examinations were normal. Pertinent       negatives include normal sphincter tone and no palpable rectal lesions.      Three sessile polyps were found in the descending colon, transverse       colon and ascending colon. The polyps were 5 to 6 mm in size. These       polyps were removed with a cold snare. Resection and retrieval were       complete. To prevent bleeding after the polypectomy, one hemostatic clip       was successfully placed (MR conditional) on transverse colon polyp.  There was no bleeding at the end of the procedure.      A 10 mm polyp was found in the sigmoid colon. The polyp was sessile. The       polyp was removed with a hot snare. Resection and retrieval were       complete.      Multiple diverticula were found in the recto-sigmoid colon, sigmoid       colon and descending colon.      Non-bleeding external hemorrhoids were found during retroflexion. The       hemorrhoids were moderate. Impression:            - Three 5 to 6 mm polyps in the descending colon, in                         the transverse colon and in the ascending colon,                         removed with a cold snare. Resected and retrieved.                         Clip (MR conditional) was placed.                        - One 10 mm polyp in the sigmoid colon, removed with a                         hot snare. Resected and retrieved.                        - Diverticulosis in the recto-sigmoid colon, in the                          sigmoid colon and in the descending colon.                        - Non-bleeding external hemorrhoids. Recommendation:        - Return patient to hospital ward for ongoing care.                        - Cardiac diet today.                        - Continue present medications.                        - Await pathology results.                        - Return to GI clinic in 4 weeks.                        - Refer to a hematologist at appointment to be                         scheduled for IDA                        - To visualize the small bowel, perform video capsule  endoscopy at appointment to be scheduled as out                         patient. Procedure Code(s):     --- Professional ---                        [phone], Colonoscopy, flexible; with removal of                         tumor(s), polyp(s), or other lesion(s) by snare                         technique Diagnosis Code(s):     --- Professional ---                        K63.5, Polyp of colon                        K64.4, Residual hemorrhoidal skin tags                        K92.1, Melena (includes Hematochezia)                        D50.9, Iron deficiency anemia, unspecified                        K57.30, Diverticulosis of large intestine without                         perforation or abscess without bleeding CPT copyright 2019 American Medical Association. All rights reserved. The codes documented in this report are preliminary and upon coder review may  be revised to meet current compliance requirements. Dr. Ulyess Mort Lin Landsman MD, MD 10/21/2019 2:11:54 PM This report has been signed electronically. Number of Addenda: 0 Note Initiated On: 10/21/2019 1:05 PM Scope Withdrawal Time: 0 hours 21 minutes 39 seconds  Total Procedure Duration: 0 hours 36 minutes 32 seconds  Estimated Blood Loss:  Estimated blood loss was minimal.      [facility]

## 2019-10-21 NOTE — Plan of Care (Signed)
  Problem: Health Behavior/Discharge Planning: Goal: Ability to manage health-related needs will improve Outcome: Progressing   Problem: Safety: Goal: Ability to remain free from injury will improve Outcome: Progressing   

## 2019-10-21 NOTE — Care Management Important Message (Signed)
Important Message  Patient Details  Name: Evelyn Simmons MRN: [id number] Date of Birth: 10-01-1929   Medicare Important Message Given:  Yes     Dannette Barbara 10/21/2019, 2:57 PM

## 2019-10-21 NOTE — Progress Notes (Addendum)
Progress Note  Patient Name: Evelyn Simmons Date of Encounter: 10/21/2019  Primary Cardiologist: Virl Axe, MD   Subjective   Denies CP, racing HR, palpitations, presyncope, SOB. On [place] O2.  Reports she did not sleep well last night.  She is tired this morning, as well as feels cold.   Inpatient Medications    Scheduled Meds: . aspirin EC  81 mg Oral Daily  . DULoxetine  30 mg Oral Daily  . furosemide  40 mg Oral BID  . guaiFENesin  1,200 mg Oral BID  . insulin aspart  0-9 Units Subcutaneous TID WC  . metoprolol tartrate  25 mg Oral BID  . pantoprazole (PROTONIX) IV  40 mg Intravenous Q12H  . QUEtiapine  25 mg Oral BID  . rosuvastatin  10 mg Oral Daily  . sodium chloride flush  3 mL Intravenous Q12H  . traZODone  100 mg Oral QHS   Continuous Infusions: . sodium chloride     PRN Meds: sodium chloride, acetaminophen, ipratropium-albuterol, ondansetron (ZOFRAN) IV, sodium chloride flush   Vital Signs    Vitals:   10/20/19 2016 10/21/19 0503 10/21/19 0533 10/21/19 0736  BP: (!) 111/57 (!) 102/48  (!) 101/43  Pulse: 66 72  69  Resp: 17 19  18   Temp: 98.6 F (37 C)   97.9 F (36.6 C)  TempSrc:      SpO2: 100% 99%  98%  Weight:   81.4 kg   Height:        Intake/Output Summary (Last 24 hours) at 10/21/2019 0920 Last data filed at 10/21/2019 0428 Gross per 24 hour  Intake --  Output 4 ml  Net -4 ml   Last 3 Weights 10/21/2019 10/20/2019 10/19/2019  Weight (lbs) 179 lb 7.3 oz 176 lb 12.9 oz 177 lb 9.6 oz  Weight (kg) 81.4 kg 80.2 kg 80.559 kg      Telemetry    Paced afib rhythm - Personally Reviewed  ECG    No new tracings  - Personally Reviewed  Physical Exam   GEN: No acute distress.   Neck: No JVD Cardiac: IRIR, no murmurs, rubs, or gallops.  Respiratory: Diminished breath sounds bilaterally GI: Soft, nontender, non-distended  MS: No edema; No deformity. Neuro:  Nonfocal  Psych: Normal affect   Labs    High Sensitivity Troponin:   Recent Labs   Lab 10/02/19 1032 10/04/19 0935 10/04/19 1600 10/18/19 1843 10/18/19 2337  TROPONINIHS 13 13 24* 16 17      Chemistry Recent Labs  Lab 10/18/19 1843 10/18/19 1843 10/19/19 0634 10/20/19 0532 10/21/19 0508  NA 145   < > 145 144 141  K 3.5   < > 3.3* 3.7 4.2  CL 105   < > 105 106 104  CO2 30   < > 33* 31 29  GLUCOSE 141*   < > 171* 144* 114*  BUN 42*   < > 37* 27* 29*  CREATININE 1.49*   < > 1.32* 1.08* 1.54*  CALCIUM 9.6   < > 9.8 9.6 9.9  PROT 5.8*  --   --   --   --   ALBUMIN 3.1*  --   --   --   --   AST 20  --   --   --   --   ALT 19  --   --   --   --   ALKPHOS 57  --   --   --   --  BILITOT 0.6  --   --   --   --   GFRNONAA 31*   < > 36* 46* 30*  GFRAA 36*   < > 42* 53* 35*  ANIONGAP 10   < > 7 7 8    < > = values in this interval not displayed.     Hematology Recent Labs  Lab 10/19/19 1454 10/20/19 0532 10/21/19 0508  WBC 7.8 7.9 5.5  RBC 2.85* 2.59* 2.86*  HGB 8.4* 7.7* 8.4*  HCT 27.7* 25.1* 28.4*  MCV 97.2 96.9 99.3  MCH 29.5 29.7 29.4  MCHC 30.3 30.7 29.6*  RDW 16.6* 16.9* 16.9*  PLT 250 236 252    BNP Recent Labs  Lab 10/18/19 1843  BNP 377.8*     DDimer No results for input(s): DDIMER in the last 168 hours.   Radiology    DG Abd 2 Views  Result Date: 10/19/2019 CLINICAL DATA:  Epigastric pain EXAM: ABDOMEN - 2 VIEW COMPARISON:  None. FINDINGS: Scattered large and small bowel gas is noted. No obstructive changes seen. No free air is noted. Diffuse vascular calcifications are noted. No renal or ureteral stones are seen. Degenerative change of lumbar spine with scoliosis concave to the right is noted. No acute abnormality noted. IMPRESSION: Chronic changes as described without acute abnormality. Electronically Signed   By: Inez Catalina M.D.   On: 10/19/2019 11:03    Cardiac Studies   Echo 09/18/19 1. Left ventricular ejection fraction, by estimation, is 45 to 50%. The  left ventricle has mildly decreased function. The left ventricle   demonstrates global hypokinesis. There is mild concentric left ventricular  hypertrophy. Left ventricular diastolic  parameters are indeterminate. Elevated left ventricular end-diastolic  pressure.  2. Right ventricular systolic function is mildly reduced. The right  ventricular size is normal. There is mildly elevated pulmonary artery  systolic pressure.  3. Left atrial size was severely dilated.  4. Right atrial size was mildly dilated.  5. The mitral valve is normal in structure. Mild mitral valve  regurgitation. No evidence of mitral stenosis.  6. The aortic valve is tricuspid. Aortic valve regurgitation is not  visualized. No aortic stenosis is present.  7. The inferior vena cava is normal in size with <50% respiratory  variability, suggesting right atrial pressure of 8 mmHg.   Patient Profile     86 y.o. female with a hx of coronary artery disease s/p LAD and RCA stenting (1995 in 2011), SSS s/p [employer] PPM 2014, persistent atrial fibrillation not on anticoagulation secondary to prior history of bleeding and anemia with current positive FOBT, HFrEF (45 to 50%), ICM, anemia, DM2, hypertension, hyperlipidemia, chronic hypoxic respiratory failure on home O2, COPD, previous tobacco use, GERD, and who is being seen today for the evaluation of acute on chronic HFrEF.  Assessment & Plan    Heart failure with midrange ejection fraction/ischemic cardiomyopathy --Denies shortness of breath on [place] oxygen. --Recent echo as directly above with EF 45-50%.  --Transitioned to oral diuresis yesterday with lasix 40mg  BID.  --Euvolemic on exam. Bump in Cr  1.08  1.54 and BUN 27  29 with soft BP and NPO status on diuresis. Likely worsening renal function given anemia, overnight hypotension, and NPO status on diuresis.  --Given soft BP with AKI, reduced dose of BB to metoprolol tartrate 12.5mg  until room in BP, as endoscopy/colonoscopy will likely worsen any hypotension.Already received  AM dose of diuresis.  --Daily BMET. --Continue to monitor I's/O's, daily standing weights.  --  Net -1L yesterday with wt 79.4kg  81.4kg and question accuracy of weights.   --Following endoscopy/colonoscopy, titrate BB back to previous dose as BP allows for rate control.  Restart diuresis after procedures as renal function allows.  Persistent atrial fibrillation --Denies any current chest pain, racing heart rate, palpitations. --Plan for rate control only.  Previously, escalation of rate controlling medications limited by hypotension/orthostasis. As above, reduce dose of BB given early AM hypotension and AKI with upcoming GI procedures. --She is not anticoagulated secondary to GIB, anemia, history of falls. Positive FOBT and pending endoscopy and colonoscopy. Risk outweighs benefit of anticoagulation. CHA2DS2VASc score of at least 6 (CHF, HTN, agex2, vascular, female). No plan for cardioversion given patient is not therapeutically anticoagulated; therefore, DCCV carries risk of thromboembolic event. Given her BMI and severely dilated left atrium, she is also unlikely to hold NSR.   + FOBT; Anemia --FOBT positive.  Pending endoscopy and colonoscopy.   Essential hypertension  --Current blood pressure soft with AM lasix already given and BB reduced before GI procedure and to be reassessed after the procedure and escalated back to baseline dose as tolerated.   Coronary artery disease --No chest pain.  High-sensitivity troponin minimally elevated and flat trending.  Not consistent with ACS.  More consistent with mild supply demand ischemia in the setting of anemia, heart failure, and CKD.  Continue beta-blocker, statin. Consider holding ASA if Hgb drops below 8.0.  CKDIII --Daily BMET. Current renal function stable.   HLD, LDL goal <70 --Continue statin.  8/7 LDL 70 and well controlled.  For questions or updates, please contact [place] Please consult [email or web address] for contact info  under        Signed, Arvil Chaco, PA-C  10/21/2019, 9:20 AM

## 2019-10-21 NOTE — Op Note (Signed)
[facility] Gastroenterology Patient Name: Evelyn Simmons Procedure Date: 10/21/2019 1:05 PM MRN: [id number] Account #: [id number] Date of Birth: 11/02/1929 Admit Type: Inpatient Age: 86 Room: [facility] ENDO ROOM 3 Gender: Female Note Status: Finalized Procedure:             Upper GI endoscopy Indications:           Unexplained iron deficiency anemia, Melena Providers:             Lin Landsman MD, MD Referring MD:          No Local Md, MD (Referring MD) Medicines:             Monitored Anesthesia Care Complications:         No immediate complications. Estimated blood loss: None. Procedure:             Pre-Anesthesia Assessment:                        - Prior to the procedure, a History and Physical was                         performed, and patient medications and allergies were                         reviewed. The patient is competent. The risks and                         benefits of the procedure and the sedation options and                         risks were discussed with the patient. All questions                         were answered and informed consent was obtained.                         Patient identification and proposed procedure were                         verified by the physician, the nurse, the                         anesthesiologist, the anesthetist and the technician                         in the pre-procedure area in the procedure room in the                         endoscopy suite. Mental Status Examination: alert and                         oriented. Airway Examination: normal oropharyngeal                         airway and neck mobility. Respiratory Examination:                         clear to auscultation. CV Examination: irregularly  irregular rate and rhythm. Prophylactic Antibiotics:                         The patient does not require prophylactic antibiotics.                         Prior Anticoagulants: The  patient has taken no                         previous anticoagulant or antiplatelet agents. ASA                         Grade Assessment: IV - A patient with severe systemic                         disease that is a constant threat to life. After                         reviewing the risks and benefits, the patient was                         deemed in satisfactory condition to undergo the                         procedure. The anesthesia plan was to use monitored                         anesthesia care (MAC). Immediately prior to                         administration of medications, the patient was                         re-assessed for adequacy to receive sedatives. The                         heart rate, respiratory rate, oxygen saturations,                         blood pressure, adequacy of pulmonary ventilation, and                         response to care were monitored throughout the                         procedure. The physical status of the patient was                         re-assessed after the procedure.                        After obtaining informed consent, the endoscope was                         passed under direct vision. Throughout the procedure,                         the patient's blood pressure, pulse, and oxygen  saturations were monitored continuously. The Endoscope                         was introduced through the mouth, and advanced to the                         second part of duodenum. The upper GI endoscopy was                         accomplished without difficulty. The patient tolerated                         the procedure well. Findings:      The duodenal bulb and second portion of the duodenum were normal.      Localized mildly erythematous mucosa without bleeding was found on the       greater curvature of the gastric body.      A medium-sized hiatal hernia was present.      The cardia and gastric fundus were normal on  retroflexion.      The gastroesophageal junction and examined esophagus were normal. Impression:            - Normal duodenal bulb and second portion of the                         duodenum.                        - Erythematous mucosa in the greater curvature of the                         gastric body.                        - Medium-sized hiatal hernia.                        - Normal gastroesophageal junction and esophagus.                        - No specimens collected. Recommendation:        - Proceed with colonoscopy as scheduled                        See colonoscopy report                        - Perform an H. pylori serology. Procedure Code(s):     --- Professional ---                        [phone], Esophagogastroduodenoscopy, flexible,                         transoral; diagnostic, including collection of                         specimen(s) by brushing or washing, when performed                         (separate procedure) Diagnosis Code(s):     --- Professional ---  K31.89, Other diseases of stomach and duodenum                        K44.9, Diaphragmatic hernia without obstruction or                         gangrene                        D50.9, Iron deficiency anemia, unspecified                        K92.1, Melena (includes Hematochezia) CPT copyright 2019 American [employer] Association. All rights reserved. The codes documented in this report are preliminary and upon coder review may  be revised to meet current compliance requirements. Dr. Ulyess Mort Lin Landsman MD, MD 10/21/2019 1:27:30 PM This report has been signed electronically. Number of Addenda: 0 Note Initiated On: 10/21/2019 1:05 PM Estimated Blood Loss:  Estimated blood loss: none.      [facility]

## 2019-10-22 ENCOUNTER — Encounter
Admission: EM | Disposition: A | Discharge status: Home Health | Payer: Self-pay | Source: Home / Self Care | Attending: Internal Medicine

## 2019-10-22 ENCOUNTER — Encounter: Payer: Self-pay | Admitting: Certified Registered"

## 2019-10-22 ENCOUNTER — Encounter: Payer: Self-pay | Admitting: Gastroenterology

## 2019-10-22 DIAGNOSIS — D509 Iron deficiency anemia, unspecified: Secondary | ICD-10-CM

## 2019-10-22 HISTORY — PX: GIVENS CAPSULE STUDY: SHX5432

## 2019-10-22 LAB — BASIC METABOLIC PANEL
Anion gap: 8 (ref 5–15)
BUN: 29 mg/dL — ABNORMAL HIGH (ref 8–23)
CO2: 27 mmol/L (ref 22–32)
Calcium: 9.1 mg/dL (ref 8.9–10.3)
Chloride: 103 mmol/L (ref 98–111)
Creatinine, Ser: 1.65 mg/dL — ABNORMAL HIGH (ref 0.44–1.00)
GFR calc Af Amer: 32 mL/min — ABNORMAL LOW (ref >60)
GFR calc non Af Amer: 27 mL/min — ABNORMAL LOW (ref >60)
Glucose, Bld: 116 mg/dL — ABNORMAL HIGH (ref 70–99)
Potassium: 3.8 mmol/L (ref 3.5–5.1)
Sodium: 138 mmol/L (ref 135–145)

## 2019-10-22 LAB — CBC
HCT: 24.1 % — ABNORMAL LOW (ref 36.0–46.0)
Hemoglobin: 7.8 g/dL — ABNORMAL LOW (ref 12.0–15.0)
MCH: 30.4 pg (ref 26.0–34.0)
MCHC: 32.4 g/dL (ref 30.0–36.0)
MCV: 93.8 fL (ref 80.0–100.0)
Platelets: 245 10*3/uL (ref 150–400)
RBC: 2.57 MIL/uL — ABNORMAL LOW (ref 3.87–5.11)
RDW: 16.9 % — ABNORMAL HIGH (ref 11.5–15.5)
WBC: 4.9 10*3/uL (ref 4.0–10.5)
nRBC: 0 % (ref 0.0–0.2)

## 2019-10-22 LAB — GLUCOSE, CAPILLARY
Glucose-Capillary: 112 mg/dL — ABNORMAL HIGH (ref 70–99)
Glucose-Capillary: 82 mg/dL (ref 70–99)
Glucose-Capillary: 122 mg/dL — ABNORMAL HIGH (ref 70–99)
Glucose-Capillary: 167 mg/dL — ABNORMAL HIGH (ref 70–99)

## 2019-10-22 SURGERY — IMAGING PROCEDURE, GI TRACT, INTRALUMINAL, VIA CAPSULE
Anesthesia: General

## 2019-10-22 MED ORDER — SODIUM CHLORIDE 0.9 % IV SOLN
300.0000 mg | Freq: Once | INTRAVENOUS | Status: AC
  Administered 2019-10-22: 300 mg via INTRAVENOUS
  Filled 2019-10-22: qty 15

## 2019-10-22 NOTE — Progress Notes (Signed)
Progress Note  Patient Name: Evelyn Simmons Date of Encounter: 10/22/2019  [facility] Cardiologist: Virl Axe, MD   Subjective   No complaints this morning Very lethargic, arousable but drifts back to sleep Notes reviewed from PT 2 days ago, was able to mobilize herself with minimal assist --Discussed her medications with her, reports that she has too many medications, " 13", does not like to take so many -She does go to [place] 3 days a week, does activities, but is picks her up --- Has a lady that comes once a week and does some housework for her  Inpatient Medications    Scheduled Meds: . aspirin EC  81 mg Oral Daily  . DULoxetine  30 mg Oral Daily  . guaiFENesin  1,200 mg Oral BID  . metoprolol tartrate  12.5 mg Oral BID  . pantoprazole  40 mg Oral Daily  . QUEtiapine  25 mg Oral BID  . rosuvastatin  10 mg Oral Daily  . sodium chloride flush  3 mL Intravenous Q12H  . traZODone  100 mg Oral QHS   Continuous Infusions: . sodium chloride     PRN Meds: sodium chloride, acetaminophen, ipratropium-albuterol, ondansetron (ZOFRAN) IV, sodium chloride flush   Vital Signs    Vitals:   10/21/19 1523 10/21/19 2047 10/22/19 0341 10/22/19 0733  BP: 106/86 (!) 92/53 (!) 88/51 (!) 101/52  Pulse: (!) 102 78 81 95  Resp: 18 18 17 20   Temp: 98.1 F (36.7 C) 97.9 F (36.6 C) 97.8 F (36.6 C) 98.3 F (36.8 C)  TempSrc: Oral Oral Oral Oral  SpO2: 99% 93% 95% 90%  Weight:   82.4 kg   Height:        Intake/Output Summary (Last 24 hours) at 10/22/2019 1225 Last data filed at 10/22/2019 1100 Gross per 24 hour  Intake 0 ml  Output 0 ml  Net 0 ml   Last 3 Weights 10/22/2019 10/21/2019 10/21/2019  Weight (lbs) 181 lb 11.2 oz 179 lb 7.3 oz 179 lb 7.3 oz  Weight (kg) 82.419 kg 81.4 kg 81.4 kg      Telemetry    Atrial fibrillation intermittently paced- Personally Reviewed  ECG     - Personally Reviewed  Physical Exam   GEN: No acute distress.  Lying supine, drifts  quickly back to sleep Neck: No JVD Cardiac:  Irregularly irregular no murmurs, rubs, or gallops.  Respiratory: Clear to auscultation bilaterally. GI: Soft, nontender, non-distended  MS: No edema; No deformity. Neuro:  Nonfocal  Psych: Normal affect when awake, very sleepy  Labs    High Sensitivity Troponin:   Recent Labs  Lab 10/02/19 1032 10/04/19 0935 10/04/19 1600 10/18/19 1843 10/18/19 2337  TROPONINIHS 13 13 24* 16 17      Chemistry Recent Labs  Lab 10/18/19 1843 10/19/19 0634 10/20/19 0532 10/21/19 0508 10/22/19 0620  NA 145   < > 144 141 138  K 3.5   < > 3.7 4.2 3.8  CL 105   < > 106 104 103  CO2 30   < > 31 29 27   GLUCOSE 141*   < > 144* 114* 116*  BUN 42*   < > 27* 29* 29*  CREATININE 1.49*   < > 1.08* 1.54* 1.65*  CALCIUM 9.6   < > 9.6 9.9 9.1  PROT 5.8*  --   --   --   --   ALBUMIN 3.1*  --   --   --   --  AST 20  --   --   --   --   ALT 19  --   --   --   --   ALKPHOS 57  --   --   --   --   BILITOT 0.6  --   --   --   --   GFRNONAA 31*   < > 46* 30* 27*  GFRAA 36*   < > 53* 35* 32*  ANIONGAP 10   < > 7 8 8    < > = values in this interval not displayed.     Hematology Recent Labs  Lab 10/20/19 0532 10/21/19 0508 10/22/19 0620  WBC 7.9 5.5 4.9  RBC 2.59* 2.86* 2.57*  HGB 7.7* 8.4* 7.8*  HCT 25.1* 28.4* 24.1*  MCV 96.9 99.3 93.8  MCH 29.7 29.4 30.4  MCHC 30.7 29.6* 32.4  RDW 16.9* 16.9* 16.9*  PLT 236 252 245    BNP Recent Labs  Lab 10/18/19 1843  BNP 377.8*     DDimer No results for input(s): DDIMER in the last 168 hours.   Radiology    No results found.  Cardiac Studies     Patient Profile     86 y.o. female with a hx of coronary artery disease s/p LAD and RCA stenting (1995 in 2011), SSS s/p [employer] PPM 2014,persistentatrial fibrillation not on anticoagulation secondary to prior history of bleeding and anemiawith current positive FOBT, HFrEF (45 to 50%), ICM, anemia, DM2, hypertension, hyperlipidemia,  chronic hypoxic respiratory failure on home O2, COPD,previous tobacco use,GERD, andwho is being seen today for the evaluation ofacute on chronic HFrEF.  Assessment & Plan  \  Respiratory distress Multifactorial including atrial fibrillation, cardiomyopathy, anemia, morbid obesity/profound deconditioning --She has been treated with IV Lasix, transition to oral Lasix twice daily yesterday, she did receive morning dose Lasix will be held given climbing creatinine Appears euvolemic this morning as with yesterday -Consider restarting Lasix 40 daily tomorrow, as outpatient will likely need 40 twice daily --Concern for compliance with her medications Would likely benefit from visiting nurses, even paramedicine  Anemia --Procedures by GI yesterday Further decline in blood count hemoglobin 7.8 May need iron infusion  Permanent atrial fibrillation Rate controlled on metoprolol, not a good candidate for anticoagulation at this time especially in light of gait instability, anemia  Weakness/deconditioning By PT report did well 2 days ago Suspect she will be weak in the setting of anemia Would retest with PT prior to any discharge plan   Total encounter time more than 25 minutes  Greater than 50% was spent in counseling and coordination of care with the patient     For questions or updates, please contact [place] Please consult [email or web address] for contact info under        Signed, Ida Rogue, MD  10/22/2019, 12:25 PM

## 2019-10-22 NOTE — Progress Notes (Signed)
Mobility Specialist - Progress Note   10/22/19 1700  Mobility  Activity  (Chair exercises and washed face)  Range of Motion/Exercises All extremities  Level of Assistance Minimal assist, patient does 75% or more  Assistive Device None  Distance Ambulated (ft) 0 ft  Mobility Response Tolerated well  Mobility performed by Mobility specialist  $Mobility charge 1 Mobility    Pre-mobility: 71 HR, 104/70 BP, 98% SpO2 Post-mobility: 85 HR, 111/65 BP, 97% SpO2   Pt was sitting in recliner upon arrival. Pt agreed to session. Pt washed her face, mobility assisted with set up. Pt performed exercises in chair: ankle pumps, ankle circles, straight leg raises, and hip abd/add (10x/leg) with minA. Overall, pt tolerated session well. Pt remains in recliner with all needs within reach.    Kathee Delton Mobility Specialist 10/22/19, 5:10 PM

## 2019-10-22 NOTE — Progress Notes (Signed)
  Heart Failure Nurse Navigator Note  HFrEF-EF 45-50%  She presented from home with complaints of increasing SOB, productive cough and weight gain.  Comorbidities:  Coronary artery disease Diabetes COPD Persistent A fib Anemia  Medications:  Amiodarone 200 mg daily ASA 81 mg daily Metoprolol tartrate  12.5 mg BID (had been on 25 mg BID) Lasix 40 mg BID po -on hold Crestor 20 mg daily   Labs:  Sodium 138, potassium 3.8, BUN 29,creatinine 1.65, yesterday 1.54. Hemoglobin 7.8, hematocrit 24.1  Weights:  82.4 kg yesterday 81.4 kg  Intake and output:  Incomplete  Today lasix is being held due to bump in the creatinine level and metoprolol dose was decreased from 25 to 12.5 mg BID due to soft blood pressure readings.   Spoke with patient, she had just returned to bed after being up to the commode.  She denied any increasing SOB. She states she does not do any cooking, meals are provided by a company here in [place]. She states they are frozen and delivered to her weekly.  She also gets meals on wheels.  She states when she goes home she will continue to weigh herself daily, record the reading and will call her doctor with a 2-3 pound weight gain over night and 5 pound weight gain in  one week.  She is not going to add salt at the table and is going to give [employer] seasoning a try, she states her mother had used it.   She will monitor the fluid intake -limiting it to 8-8 ounce cups a day.  She states for activity she ambulates around her apartment, will walk down the hall and go outside.  Her daughter that lives near by does her grocery shopping.   She felt that the heart failure videos were helpful.  Pricilla Riffle RN, CHFN

## 2019-10-22 NOTE — Progress Notes (Addendum)
PROGRESS NOTE    Evelyn Simmons   LHT:[id number]  DOB: 06/27/1929  PCP: Dionicia Abler, NP    DOA: 10/18/2019 LOS: 4   Brief Narrative   Evelyn Simmons is a 87 y.o. female with medical history significant for  COPD and chronic respiratory failure on home O2 at 2-3 L/min, DM , systolic and diastolic CHF, atrial fibrillation not anti-coagualted secondary to GI bleed,  history of sick sinus syndrome s/p  pacemaker, HTN, HDL, and hypothyroidism who presents for assessment of 2 days of worsening cough and shortness of breath especially with ambulation.  Of note, patient admitted for decompensated CHF from 8/23 to 8/30.  Discharged on torsemide.  SNF was recommended for short term rehab, but patient declined as her son would be coming to stay with her for as long as needed.  Evaluation in the ED revealed pulmonary vascular congestion on chest xray and elevated BNP.  No fever, leukocytosis or focal infiltrate om xray to suggest infection.  Admitted to hospitalist service for further management of decompensated CHF.     Assessment & Plan   Principal Problem:   Acute on chronic systolic CHF (congestive heart failure) (HCC) Active Problems:   CKD stage 3 due to type 2 diabetes mellitus (HCC)   Pacemaker   CAD (coronary artery disease)   Persistent atrial fibrillation (HCC)   Diabetes mellitus type 2 in obese (HCC)   Anemia due to chronic kidney disease   COPD (chronic obstructive pulmonary disease) (HCC)   Toe pain, right  Hypotension - likely hypovolemic in setting of NPO and bowel prep for colonoscopy yesterday, and diuresis.  Diet had not been resumed.  Expect improvement with PO intake.  Monitor closely.  Maintain MAP>=65.    Acute Anemia superimposed on anemia of CKD / Iron Deficiency Melena / Anemia - recurrent melena at home.  FOBT positive this admission.  Hbg slightly downtrended, 7.8 this AM. Likely contributes to patient's SOB.  Pt has known hx of diverticulosis/itis. EGD 9/9 -  erythematous gastric mucosa, medium hiatal hernia. Colonoscopy 9/9 - multiple polyps were removed, diverticulosis of recto-sigmoid colon, non-bleeding external hemorrhoids --Trend H&H, transfuse in Hbg <7 --GI consulted --Follow up with GI in 4 weeks, likely to do capsule endoscopy --Change IV to home oral PPI   --Venofer infusion today  Acute on chronic systolic CHF - Cardiology following.  Was discharged last time on torsemide BID, but feels Lasix works better for her.   --Hold Lasix give Cr climbing, likely can resume tomorrow. --Anticipate d/c on Lasix 40 mg BID --Continue Lopressor --recurrent admissions for CHF decompensation --continue to educate on diet and fluid restriction, daily wt --medication and fluid restriction compliance at home is questionable, given recurrent admissions --consider home health RN, or paramedicine program  Generalized weakness / Deconditioning - PT following.  Last admission, SNF was recommended but patient declined, son was to stay with her.  This admission, she is functioning somewhat better, can get around independently with minimal assist.  She does PT through [facility] on regular basis, to continue.  Persistent atrial fibrillation - not on anticoagulation due to anemia and history of GI bleeding.   Rate is controlled.  Continue Lopressor.  CHA2DS2VASC score 6.  Acquired Thrombophilia - a-fib placed patient at increased risk of clotting and stroke.  Anticoagulation currently contraindicated due to anemia and concern for GI bleed.  AKI - due to diuresis vs hypovolemia from bowel prep & NPO status for endoscopies.  Cr bumped to 1.5>>1.65 today.  Monitor BMP.    CKD stage 3b - due to diabetes.  Baseline Cr around 1.2-1.3. Monitor BMP.  Renally dose meds as indicated.  Coronary artery disease - stable, no chest pain.  Continue ASA, metoprolol, statin  Diabetes mellitus type 2 - sliding scale Novolog  COPD - not acutely exacerbated.  Continue  bronchodilators.  Toe pain, right - present on admission, pt dropped something on her foot at home just prior to coming in.  Monitor.  PT evaluation.  Hyperlipidemia - on Crestor, continued.  Obesity: Body mass index is 36.7 kg/m.  Complicates overall care and prognosis.   DVT prophylaxis: Place and maintain sequential compression device Start: 10/20/19 1150 SCDs Start: 10/18/19 2132   Diet:  Diet Orders (From admission, onward)    Start     Ordered   10/22/19 1230  Diet heart healthy/carb modified Room service appropriate? Yes; Fluid consistency: Thin  Diet effective ____       Question Answer Comment  Diet-HS Snack? Nothing   Room service appropriate? Yes   Fluid consistency: Thin      10/22/19 1047            Code Status: DNR    Subjective 10/22/19    Patient seen at bedside, sleeping and a bit difficult to arouse. She would wake up and fall back to sleep.  Reports being very thirsty.  A few minutes later, woke up to use the bathroom and got up on her own with my supervision to bedside commode.   She denies feeling dizzy, lightheaded, or SOB.  No acute complaints.     Disposition Plan & Communication   Status is: Inpatient  Remains appropriate for inpatient care due to severity of illness, hypotension requiring close monitoring, worsened AKI.  Dispo: The patient is from: home w [facility]              Anticipated d/c is to: home w [facility]              Anticipated d/c date is: 1-2 days              Patient currently is not medically stable for discharge.     Family Communication: none at bedside, will attempt to call   Consults, Procedures, Significant Events   Consultants:   Cardiology  Gastroenterology  Procedures:   EGD and Colonoscopy on 9/9  Antimicrobials:   None     Objective   Vitals:   10/22/19 0341 10/22/19 0733 10/22/19 1552 10/22/19 1556  BP: (!) 88/51 (!) 101/52 (!) 133/101 (!) 100/52  Pulse: 81 95 82 81  Resp: 17 20 18    Temp: 97.8 F  (36.6 C) 98.3 F (36.8 C) 98.4 F (36.9 C)   TempSrc: Oral Oral    SpO2: 95% 90% 93%   Weight: 82.4 kg     Height:        Intake/Output Summary (Last 24 hours) at 10/22/2019 1828 Last data filed at 10/22/2019 1824 Gross per 24 hour  Intake 840 ml  Output 0 ml  Net 840 ml   Filed Weights   10/21/19 0533 10/21/19 1235 10/22/19 0341  Weight: 81.4 kg 81.4 kg 82.4 kg    Physical Exam:  General exam: lethargic then became alert, no acute distress, obese, pleasant Respiratory system: CTAB, no wheezes or rhonchi, normal respiratory effort. Cardiovascular system: normal S1/S2, RRR, no JVD, no pedal edema.   Gastrointestinal system: soft, NT, ND, +bowel sounds. Central nervous system: A&O x3. no gross  focal neurologic deficits, normal speech    Labs   Data Reviewed: I have personally reviewed following labs and imaging studies  CBC: Recent Labs  Lab 10/18/19 1843 10/19/19 1454 10/20/19 0532 10/21/19 0508 10/22/19 0620  WBC 6.3 7.8 7.9 5.5 4.9  NEUTROABS 4.1  --  6.2  --   --   HGB 8.7* 8.4* 7.7* 8.4* 7.8*  HCT 27.5* 27.7* 25.1* 28.4* 24.1*  MCV 93.9 97.2 96.9 99.3 93.8  PLT 293 250 236 252 176   Basic Metabolic Panel: Recent Labs  Lab 10/18/19 1843 10/19/19 0634 10/20/19 0532 10/21/19 0508 10/22/19 0620  NA 145 145 144 141 138  K 3.5 3.3* 3.7 4.2 3.8  CL 105 105 106 104 103  CO2 30 33* 31 29 27   GLUCOSE 141* 171* 144* 114* 116*  BUN 42* 37* 27* 29* 29*  CREATININE 1.49* 1.32* 1.08* 1.54* 1.65*  CALCIUM 9.6 9.8 9.6 9.9 9.1  MG 1.9 1.6* 2.2 2.4  --    GFR: Estimated Creatinine Clearance: 21.9 mL/min (A) (by C-G formula based on SCr of 1.65 mg/dL (H)). Liver Function Tests: Recent Labs  Lab 10/18/19 1843  AST 20  ALT 19  ALKPHOS 57  BILITOT 0.6  PROT 5.8*  ALBUMIN 3.1*   No results for input(s): LIPASE, AMYLASE in the last 168 hours. No results for input(s): AMMONIA in the last 168 hours. Coagulation Profile: No results for input(s): INR, PROTIME  in the last 168 hours. Cardiac Enzymes: No results for input(s): CKTOTAL, CKMB, CKMBINDEX, TROPONINI in the last 168 hours. BNP (last 3 results) No results for input(s): PROBNP in the last 8760 hours. HbA1C: No results for input(s): HGBA1C in the last 72 hours. CBG: Recent Labs  Lab 10/21/19 1632 10/21/19 2114 10/22/19 0757 10/22/19 1109 10/22/19 1619  GLUCAP 117* 174* 112* 82 122*   Lipid Profile: No results for input(s): CHOL, HDL, LDLCALC, TRIG, CHOLHDL, LDLDIRECT in the last 72 hours. Thyroid Function Tests: No results for input(s): TSH, T4TOTAL, FREET4, T3FREE, THYROIDAB in the last 72 hours. Anemia Panel: Recent Labs    10/20/19 0532 10/20/19 1333  VITAMINB12  --  402  FERRITIN 29  --   TIBC 266  --   IRON 17*  --    Sepsis Labs: Recent Labs  Lab 10/18/19 1843  PROCALCITON <0.10    Recent Results (from the past 240 hour(s))  SARS Coronavirus 2 by RT PCR (hospital order, performed in [facility] hospital lab) Nasopharyngeal Nasopharyngeal Swab     Status: None   Collection Time: 10/18/19  6:43 PM   Specimen: Nasopharyngeal Swab  Result Value Ref Range Status   SARS Coronavirus 2 NEGATIVE NEGATIVE Final    Comment: (NOTE) SARS-CoV-2 target nucleic acids are NOT DETECTED.  The SARS-CoV-2 RNA is generally detectable in upper and lower respiratory specimens during the acute phase of infection. The lowest concentration of SARS-CoV-2 viral copies this assay can detect is 250 copies / mL. A negative result does not preclude SARS-CoV-2 infection and should not be used as the sole basis for treatment or other patient management decisions.  A negative result may occur with improper specimen collection / handling, submission of specimen other than nasopharyngeal swab, presence of viral mutation(s) within the areas targeted by this assay, and inadequate number of viral copies (<250 copies / mL). A negative result must be combined with clinical observations, patient  history, and epidemiological information.  Fact Sheet for Patients:   StrictlyIdeas.no  Fact Sheet for Healthcare Providers:  BankingDealers.co.za  This test is not yet approved or  cleared by the [place] and has been authorized for detection and/or diagnosis of SARS-CoV-2 by FDA under an Emergency Use Authorization (EUA).  This EUA will remain in effect (meaning this test can be used) for the duration of the COVID-19 declaration under Section 564(b)(1) of the Act, 21 U.S.C. section 360bbb-3(b)(1), unless the authorization is terminated or revoked sooner.  Performed at [facility], [address], [place], [place] [address]       Imaging Studies   No results found.   Medications   Scheduled Meds: . aspirin EC  81 mg Oral Daily  . DULoxetine  30 mg Oral Daily  . guaiFENesin  1,200 mg Oral BID  . metoprolol tartrate  12.5 mg Oral BID  . pantoprazole  40 mg Oral Daily  . QUEtiapine  25 mg Oral BID  . rosuvastatin  10 mg Oral Daily  . sodium chloride flush  3 mL Intravenous Q12H  . traZODone  100 mg Oral QHS   Continuous Infusions: . sodium chloride         LOS: 4 days    Time spent: 25 minutes with > 50% spent in coordination of care and direct patient contact.    Ezekiel Slocumb, DO [facility]  10/22/2019, 6:28 PM    If 7PM-7AM, please contact night-coverage. How to contact the [facility] Attending or Consulting provider [place] or covering provider during after hours [place], for this patient?    1. Check the care team in [facility] and look for a) attending/consulting TR[facility] provider listed and b) the [facility] team listed 2. Log into [email or web address] and use [place]'s universal password to access. If you do not have the password, please contact the hospital operator. 3. Locate the [facility] provider you are looking for under [facility] and page to a number that you can be directly reached. 4. If you  still have difficulty reaching the provider, please page the [facility] (Director on Call) for the Hospitalists listed on amion for assistance.

## 2019-10-22 NOTE — Progress Notes (Signed)
Physical Therapy Treatment Patient Details Name: Evelyn Simmons MRN: [id number] DOB: 1930/02/08 Today's Date: 10/22/2019    History of Present Illness Per MD notes: Pt is an 86 y.o. female with medical history significant for  COPD and chronic respiratory failure on home O2 at 2-3 L/min, DM , systolic and diastolic CHF, atrial fibrillation not anti-coagualted secondary to GI bleed,  history of sick sinus syndrome s/p  pacemaker, HTN, HDL, and hypothyroidism who presents for assessment of 2 days of worsening cough and shortness of breath as well as soft tissue injury to R great toe after dropping a frozen block of cheeze on it.  MD assessment includes: acute on chronic systolic heart failure, Chronic kidney disease stage III/type 2 diabetes mellitus, black stools/anemia, abdominal discomfort, A-fib s/p pacemaker, hypokalemia, hypomagnesemia, HTN, R toe pain, and anxiety.    PT Comments    Pt was pleasant and motivated to participate during the session.  Pt demonstrated good functional strength with good speed, effort, and control with bed mobility and transfers to/from various height surfaces.  Pt complained of L knee weakness that has been somewhat chronic for patient "for about a year" and requested limited ambulation this session but was steady during ambulation with SpO2 >/=95% throughout the session on 2LO2/min.  Pt will benefit from HHPT services upon discharge to safely address deficits listed in patient problem list for decreased caregiver assistance and eventual return to PLOF.      Follow Up Recommendations  Supervision - Intermittent;Home health PT     Equipment Recommendations  None recommended by PT    Recommendations for Other Services       Precautions / Restrictions Precautions Precautions: Fall Precaution Comments: O2 sats Restrictions Weight Bearing Restrictions: No    Mobility  Bed Mobility Overal bed mobility: Needs Assistance Bed Mobility: Supine to Sit      Supine to sit: Modified independent (Device/Increase time)     General bed mobility comments: Extra time and effort with sup to sit but no physical assistance required  Transfers Overall transfer level: Needs assistance Equipment used: Rolling walker (2 wheeled);None Transfers: Sit to/from [employer] to Stand: Supervision Stand pivot transfers: Supervision       General transfer comment: Good eccentric and concentric control and stability  Ambulation/Gait Ambulation/Gait assistance: Supervision Gait Distance (Feet): 20 Feet x 1, 10 Feet x 1 Assistive device: Rolling walker (2 wheeled) Gait Pattern/deviations: Step-through pattern;Decreased step length - right;Decreased step length - left Gait velocity: decreased   General Gait Details: Slow cadence but steady without LOB with SpO2 remaining in the 90s on 2LO2/min and HR WNL   Stairs             Wheelchair Mobility    Modified Rankin (Stroke Patients Only)       Balance Overall balance assessment: Needs assistance   Sitting balance-Leahy Scale: Normal     Standing balance support: Bilateral upper extremity supported;During functional activity Standing balance-Leahy Scale: Good                              Cognition Arousal/Alertness: Awake/alert Behavior During Therapy: WFL for tasks assessed/performed Overall Cognitive Status: Within Functional Limits for tasks assessed                                        Exercises Total Joint Exercises  Ankle Circles/Pumps: Strengthening;Both;10 reps Quad Sets: Strengthening;Both;10 reps Gluteal Sets: Strengthening;Both;10 reps Hip ABduction/ADduction: Strengthening;Both;10 reps Long Arc Quad: Strengthening;Both;10 reps Knee Flexion: Strengthening;Both;10 reps Other Exercises Other Exercises: Sit to/from stand x 5 Other Exercises: SPT training from various height surfaces    General Comments         Pertinent Vitals/Pain Pain Assessment: No/denies pain    Home Living                      Prior Function            PT Goals (current goals can now be found in the care plan section) Progress towards PT goals: Not progressing toward goals - comment (limited by fatigue)    Frequency    Min 2X/week      PT Plan Current plan remains appropriate    Co-evaluation              AM-PAC PT "6 Clicks" Mobility   Outcome Measure  Help needed turning from your back to your side while in a flat bed without using bedrails?: None Help needed moving from lying on your back to sitting on the side of a flat bed without using bedrails?: None Help needed moving to and from a bed to a chair (including a wheelchair)?: A Little Help needed standing up from a chair using your arms (e.g., wheelchair or bedside chair)?: A Little Help needed to walk in hospital room?: A Little Help needed climbing 3-5 steps with a railing? : A Little 6 Click Score: 20    End of Session Equipment Utilized During Treatment: Gait belt;Oxygen Activity Tolerance: Patient tolerated treatment well Patient left: in chair;with call bell/phone within reach;with chair alarm set Nurse Communication: Mobility status PT Visit Diagnosis: Muscle weakness (generalized) (M62.81);Difficulty in walking, not elsewhere classified (R26.2)     Time: 6060-0459 PT Time Calculation (min) (ACUTE ONLY): 24 min  Charges:  $Gait Training: 8-22 mins $Therapeutic Exercise: 8-22 mins                     D. Scott Nobuo Nunziata PT, DPT 10/22/19, 4:54 PM

## 2019-10-23 DIAGNOSIS — D509 Iron deficiency anemia, unspecified: Secondary | ICD-10-CM

## 2019-10-23 DIAGNOSIS — E1122 Type 2 diabetes mellitus with diabetic chronic kidney disease: Secondary | ICD-10-CM

## 2019-10-23 DIAGNOSIS — N183 Chronic kidney disease, stage 3 unspecified: Secondary | ICD-10-CM

## 2019-10-23 LAB — CBC
HCT: 26.7 % — ABNORMAL LOW (ref 36.0–46.0)
Hemoglobin: 8.4 g/dL — ABNORMAL LOW (ref 12.0–15.0)
MCH: 29.4 pg (ref 26.0–34.0)
MCHC: 31.5 g/dL (ref 30.0–36.0)
MCV: 93.4 fL (ref 80.0–100.0)
Platelets: 254 K/uL (ref 150–400)
RBC: 2.86 MIL/uL — ABNORMAL LOW (ref 3.87–5.11)
RDW: 17 % — ABNORMAL HIGH (ref 11.5–15.5)
WBC: 4.9 K/uL (ref 4.0–10.5)
nRBC: 0 % (ref 0.0–0.2)

## 2019-10-23 LAB — BASIC METABOLIC PANEL
Anion gap: 9 (ref 5–15)
BUN: 28 mg/dL — ABNORMAL HIGH (ref 8–23)
CO2: 26 mmol/L (ref 22–32)
Calcium: 9.6 mg/dL (ref 8.9–10.3)
Chloride: 101 mmol/L (ref 98–111)
Creatinine, Ser: 1.61 mg/dL — ABNORMAL HIGH (ref 0.44–1.00)
GFR calc Af Amer: 33 mL/min — ABNORMAL LOW (ref >60)
GFR calc non Af Amer: 28 mL/min — ABNORMAL LOW (ref >60)
Glucose, Bld: 98 mg/dL (ref 70–99)
Potassium: 3.8 mmol/L (ref 3.5–5.1)
Sodium: 136 mmol/L (ref 135–145)

## 2019-10-23 LAB — MAGNESIUM: Magnesium: 2.4 mg/dL (ref 1.7–2.4)

## 2019-10-23 MED ORDER — FUROSEMIDE 40 MG PO TABS: 40.0000 mg | ORAL_TABLET | Freq: Two times a day (BID) | ORAL | 2 refills | Status: DC

## 2019-10-23 MED ORDER — METOPROLOL TARTRATE 25 MG PO TABS: 12.5000 mg | ORAL_TABLET | Freq: Two times a day (BID) | ORAL | 2 refills | Status: DC

## 2019-10-23 NOTE — Progress Notes (Signed)
Evelyn Antigua, MD [address], [place], [place], [place], [address] [address], [place], [place], [place], [address] Phone: [phone]  Fax: [phone]   Subjective: Patient resting in bed comfortably.  Repeat hemoglobin not available this morning.  No abdominal pain.  Denies any bleeding.   Objective: Exam: Vital signs in last 24 hours: Vitals:   10/22/19 1552 10/22/19 1556 10/22/19 1935 10/23/19 0408  BP: (!) 133/101 (!) 100/52 130/89 120/63  Pulse: 82 81 95 83  Resp: 18  18 16   Temp: 98.4 F (36.9 C)  98 F (36.7 C) 98.1 F (36.7 C)  TempSrc:   Oral Oral  SpO2: 93%  99% 98%  Weight:    83.1 kg  Height:       Weight change: 1.744 kg  Intake/Output Summary (Last 24 hours) at 10/23/2019 0734 Last data filed at 10/22/2019 2200 Gross per 24 hour  Intake 1200 ml  Output --  Net 1200 ml    General: No acute distress, AAO x3 Abd: Soft, NT/ND, No HSM Skin: Warm, no rashes Neck: Supple, Trachea midline   Lab Results: Lab Results  Component Value Date   WBC 4.9 10/22/2019   HGB 7.8 (L) 10/22/2019   HCT 24.1 (L) 10/22/2019   MCV 93.8 10/22/2019   PLT 245 10/22/2019   Micro Results: Recent Results (from the past 240 hour(s))  SARS Coronavirus 2 by RT PCR (hospital order, performed in [place] hospital lab) Nasopharyngeal Nasopharyngeal Swab     Status: None   Collection Time: 10/18/19  6:43 PM   Specimen: Nasopharyngeal Swab  Result Value Ref Range Status   SARS Coronavirus 2 NEGATIVE NEGATIVE Final    Comment: (NOTE) SARS-CoV-2 target nucleic acids are NOT DETECTED.  The SARS-CoV-2 RNA is generally detectable in upper and lower respiratory specimens during the acute phase of infection. The lowest concentration of SARS-CoV-2 viral copies this assay can detect is 250 copies / mL. A negative result does not preclude SARS-CoV-2 infection and should not be used as the sole basis for treatment or other patient management decisions.  A negative  result may occur with improper specimen collection / handling, submission of specimen other than nasopharyngeal swab, presence of viral mutation(s) within the areas targeted by this assay, and inadequate number of viral copies (<250 copies / mL). A negative result must be combined with clinical observations, patient history, and epidemiological information.  Fact Sheet for Patients:   StrictlyIdeas.no  Fact Sheet for Healthcare Providers: BankingDealers.co.za  This test is not yet approved or  cleared by the [place] FDA and has been authorized for detection and/or diagnosis of SARS-CoV-2 by FDA under an Emergency Use Authorization (EUA).  This EUA will remain in effect (meaning this test can be used) for the duration of the COVID-19 declaration under Section 564(b)(1) of the Act, 21 U.S.C. section 360bbb-3(b)(1), unless the authorization is terminated or revoked sooner.  Performed at [facility], [address], [place],  [address]    Studies/Results: No results found. Medications:  Scheduled Meds: . aspirin EC  81 mg Oral Daily  . DULoxetine  30 mg Oral Daily  . guaiFENesin  1,200 mg Oral BID  . metoprolol tartrate  12.5 mg Oral BID  . pantoprazole  40 mg Oral Daily  . QUEtiapine  25 mg Oral BID  . rosuvastatin  10 mg Oral Daily  . sodium chloride flush  3 mL Intravenous Q12H  . traZODone  100 mg Oral QHS  Continuous Infusions: . sodium chloride 250 mL (10/22/19 2347)   PRN Meds:.sodium chloride, acetaminophen, ipratropium-albuterol, ondansetron (ZOFRAN) IV, sodium chloride flush   Assessment: Principal Problem:   Acute on chronic systolic CHF (congestive heart failure) (HCC) Active Problems:   CKD stage 3 due to type 2 diabetes mellitus (Why)   Pacemaker   CAD (coronary artery disease)   Persistent atrial fibrillation (HCC)   Diabetes mellitus type 2 in obese (HCC)   Anemia due to chronic  kidney disease   COPD (chronic obstructive pulmonary disease) (HCC)   Toe pain, right    Plan: Small bowel capsule study will be read today and results placed in chart later today  Recommend iron replacement due to iron deficiency anemia  Continue monitoring CBC and transfuse as needed   LOS: 5 days   Evelyn Antigua, MD 10/23/2019, 7:34 AM

## 2019-10-23 NOTE — Progress Notes (Signed)
Mobility Specialist - Progress Note   10/23/19 1319  Mobility  Activity Ambulated in room  Level of Assistance Modified independent, requires aide device or extra time  Assistive Device Front wheel walker  Distance Ambulated (ft) 20 ft  Mobility Response Tolerated well  Mobility performed by Mobility specialist  $Mobility charge 1 Mobility    Pre-mobility: 95 HR, 96/52 BP, 97% SpO2 During mobility: 138 HR, 93% SpO2 Post-mobility: 105 HR, 118/97 BP, 93% SpO2   Pt laying in bed upon arrival. Pt agreed to session. Pt able to transition supine to sit EOB mod. Independently. Pt ambulated 20' in room mod. Independently, using a RW. Pt had no LOB. Noticed pt's HR elevates quickly after completing short amount of activity. Pt is aware of elevated HR. Pt shows understanding on how to manage HR and owns a pulse sock. Overall, pt tolerated session very well. Pt left in bed w/ all needs placed in reach. Nurse was notified.    Konstantina Nachreiner Mobility Specialist  10/23/19, 1:23 PM

## 2019-10-23 NOTE — Discharge Summary (Signed)
Physician Discharge Summary  [name] WLS:[id number] DOB: [birth year] DOA: 10/18/2019  PCP: Dionicia Abler, NP  Admit date: 10/18/2019 Discharge date: 10/23/2019  Admitted From: Home Disposition:  Home  Recommendations for Outpatient Follow-up:  1. Follow up with [facility] 2. Please obtain BMP/CBC in one week 3. Please follow up with cardiology 4. Continue PT with [facility] program  Home Health: Resume Equipment/Devices: None, patient already has   Discharge Condition: Stable CODE STATUS: DNR Diet recommendation: Heart Healthy / Carb Modified     Discharge Diagnoses: Principal Problem:   Acute on chronic systolic CHF (congestive heart failure) (HCC) Active Problems:   CKD stage 3 due to type 2 diabetes mellitus (HCC)   Pacemaker   CAD (coronary artery disease)   Persistent atrial fibrillation (HCC)   Diabetes mellitus type 2 in obese (HCC)   Anemia due to chronic kidney disease   COPD (chronic obstructive pulmonary disease) (HCC)   Toe pain, right    Summary of HPI and Hospital Course:  Evelyn Simmons is a 86 y.o. female with medical history significant for  COPD and chronic respiratory failure on home O2 at 2-3 L/min, DM , systolic and diastolic CHF, atrial fibrillation not anti-coagualted secondary to GI bleed,  history of sick sinus syndrome s/p  pacemaker, HTN, HDL, and hypothyroidism who presents for assessment of 2 days of worsening cough and shortness of breath especially with ambulation.  Of note, patient admitted for decompensated CHF from 8/23 to 8/30.  Discharged on torsemide.  SNF was recommended for short term rehab, but patient declined as her son would be coming to stay with her for as long as needed.  Evaluation in the ED revealed pulmonary vascular congestion on chest xray and elevated BNP.  No fever, leukocytosis or focal infiltrate om xray to suggest infection.  Admitted to hospitalist service for further management of decompensated CHF.      Hypotension - Resolved.  Was likely hypovolemic in setting of NPO and bowel prep for colonoscopy, also diuresis.  Improved once diet resumed.  Monitor BP closely.   Acute Anemia superimposed on anemia of CKD / Iron Deficiency Melena / Anemia - recurrent melena at home.  FOBT positive this admission.  Hbg slightly downtrended. Likely contributes to patient's SOB.  Pt has known hx of diverticulosis/itis. GI was consulted. EGD 9/9 - erythematous gastric mucosa, medium hiatal hernia. Colonoscopy 9/9 - multiple polyps were removed, diverticulosis of recto-sigmoid colon, non-bleeding external hemorrhoids --Follow up with GI in 4 weeks, considering video capsule endoscopy -Treated with IV PPI and transitioned back to home oral after EGD --Received Venofer infusion   Acute on chronic systolic CHF - Cardiology was consulted.   Last admission was discharged last time on torsemide BID, but feels Lasix works better for her.  Symptoms improved with IV Lasix, held once rise in Cr.   Discharged on Lasix 40 mg BID, to start on Monday Continue Lopressor Patient with recurrent admissions for CHF decompensation Educate extenstively on diet and fluid restriction, daily weights. Medication and/or fluid restriction compliance at home is questionable, given recurrent admissions [facility] to follow closely  Generalized weakness / Deconditioning - PT evaluated recommended intermittent supervision.  Patient does PT through [facility] on regular basis, to continue.  Persistent atrial fibrillation - not on anticoagulation due to anemia and history of GI bleeding.   Rate is controlled.  Continue Lopressor.  CHA2DS2VASC score 6.  Acquired Thrombophilia - a-fib placed patient at increased risk of clotting and stroke.  Anticoagulation currently contraindicated due to anemia and concern for GI bleed.  AKI - due to diuresis vs hypovolemia from bowel prep & NPO status for endoscopies.  Cr bumped to  1.5>>1.65.  Diuresis was held.  On discharge, patient to resume Lasix on Monday.  CKD stage 3b - due to diabetes.  Baseline Cr around 1.2-1.3. Monitor BMP in follow up.   Coronary artery disease - stable, no chest pain.  Continue ASA, metoprolol, statin.  Diabetes mellitus type 2 - resume prior home regimen.   Covered with sliding scale Novolog during admission.  COPD - not acutely exacerbated.  Continue bronchodilators.  Toe pain, right - present on admission, pt dropped something on her foot at home just prior to coming in.  Pain improved, patient tolerating ambulation.  Ecchymosis starting to improve.  Hyperlipidemia - on Crestor  Obesity: Body mass index is 36.7 kg/m.  Complicates overall care and prognosis.   Discharge Instructions   Discharge Instructions    (HEART FAILURE PATIENTS) Call MD:  Anytime you have any of the following symptoms: 1) 3 pound weight gain in 24 hours or 5 pounds in 1 week 2) shortness of breath, with or without a dry hacking cough 3) swelling in the hands, feet or stomach 4) if you have to sleep on extra pillows at night in order to breathe.   Complete by: As directed    Call MD for:  extreme fatigue   Complete by: As directed    Call MD for:  persistant dizziness or light-headedness   Complete by: As directed    Call MD for:  severe uncontrolled pain   Complete by: As directed    Call MD for:  temperature >100.4   Complete by: As directed    Diet - low sodium heart healthy   Complete by: As directed    Discharge instructions   Complete by: As directed    We restarted you on Lasix 40 mg twice daily.  Please wait until Monday to start taking it.   Increase activity slowly   Complete by: As directed      Allergies as of 10/23/2019      Reactions   Penicillins Nausea And Vomiting, Rash, Hives   Other Other (See Comments)   Sulfa Antibiotics    Pt unsure reaction, was told in hospital she had allergy   Penicillin G Rash      Medication  List    STOP taking these medications   amiodarone 100 MG tablet Commonly known as: PACERONE   amLODipine 2.5 MG tablet Commonly known as: NORVASC   cyclobenzaprine 5 MG tablet Commonly known as: FLEXERIL   torsemide 20 MG tablet Commonly known as: DEMADEX     TAKE these medications   Accu-Chek Nano SmartView w/Device Kit Please issue nano meter by AccuChek along with test strips at checking frequency of 2x daily. 90 day supply. E11.9   albuterol 108 (90 Base) MCG/ACT inhaler Commonly known as: VENTOLIN HFA Inhale 2 puffs into the lungs every 6 (six) hours as needed for wheezing or shortness of breath.   aspirin EC 81 MG tablet Take 81 mg by mouth daily.   cholecalciferol 1000 units tablet Commonly known as: VITAMIN D Take 1,000 Units by mouth daily.   cyanocobalamin 1000 MCG tablet Take 1 tablet (1,000 mcg total) by mouth daily.   DULoxetine 30 MG capsule Commonly known as: CYMBALTA Take 30 mg by mouth daily.   ferrous sulfate 325 (65 FE) MG tablet Take  1 tablet (325 mg total) by mouth 2 (two) times daily with a meal.   freestyle lancets Use as instructed   furosemide 40 MG tablet Commonly known as: Lasix Take 1 tablet (40 mg total) by mouth 2 (two) times daily. START on Monday 9/13   glucose blood test strip Commonly known as: FREESTYLE LITE Use as instructed   ipratropium-albuterol 0.5-2.5 (3) MG/3ML Soln Commonly known as: DUONEB Take 3 mLs by nebulization.   liraglutide 18 MG/3ML Sopn Commonly known as: VICTOZA Inject 0.6 mg into the skin daily.   loratadine 10 MG tablet Commonly known as: CLARITIN Take 10 mg by mouth daily as needed for allergies.   metoprolol tartrate 25 MG tablet Commonly known as: LOPRESSOR Take 0.5 tablets (12.5 mg total) by mouth 2 (two) times daily. What changed: how much to take   naloxone 4 MG/0.1ML Liqd nasal spray kit Commonly known as: NARCAN Place 1 spray into the nose once.   nitroGLYCERIN 0.4 MG SL  tablet Commonly known as: NITROSTAT Place 0.4 mg under the tongue every 5 (five) minutes as needed for chest pain.   pantoprazole 20 MG tablet Commonly known as: PROTONIX Take 20 mg by mouth daily.   PreserVision AREDS 2+Multi Vit Caps Take 1 capsule by mouth 2 (two) times daily.   QUEtiapine 25 MG tablet Commonly known as: SEROQUEL Take 25 mg by mouth 2 (two) times daily.   rosuvastatin 40 MG tablet Commonly known as: CRESTOR Take 1 tablet (40 mg total) by mouth daily.   SENEXON-S PO Take by mouth. Take two tablets by mouth once daily   traMADol 50 MG tablet Commonly known as: ULTRAM Take 50 mg by mouth 2 (two) times daily as needed for severe pain.   traZODone 100 MG tablet Commonly known as: DESYREL Take 100 mg by mouth at bedtime.       Follow-up Information    [facility] Follow up on 11/02/2019.   Specialty: Cardiology Why: at 11:00am. Enter through the [place] entrance Contact information: [place]             Allergies  Allergen Reactions  . Penicillins Nausea And Vomiting, Rash and Hives  . Other Other (See Comments)  . Sulfa Antibiotics     Pt unsure reaction, was told in hospital she had allergy  . Penicillin G Rash    Consultations:  Cardiology  Gastroenterology   Procedures/Studies: DG Chest 1 View  Result Date: 10/18/2019 CLINICAL DATA:  Pt presents via acems with c/o shortness of breath and congestion. Pt has hx of CHF. Pt chronically wears 2L at home. Pt respirations currently even and unlabored EXAM: CHEST  1 VIEW COMPARISON:  10/04/2019 FINDINGS: Cardiac silhouette is mildly enlarged. Stable left anterior chest wall dual lead pacemaker. No mediastinal or hilar masses. Vascular congestion with mild interstitial prominence similar to the prior exam. Possible small pleural effusions. No lung consolidation to suggest  pneumonia. No pneumothorax. Skeletal structures are demineralized but grossly intact. IMPRESSION: 1. Cardiomegaly, vascular congestion and interstitial prominence, with probable small effusions, similar to the prior study. Suspect mild congestive heart failure. No evidence of pneumonia. Electronically Signed   By: Lajean Manes M.D.   On: 10/18/2019 18:58   DG Chest 2 View  Result Date: 10/04/2019 CLINICAL DATA:  Shortness of breath with chest congestion EXAM: CHEST - 2 VIEW COMPARISON:  October 02, 2019 FINDINGS: There is ill-defined airspace opacity  in the left lower lung region. There is a minimal pleural effusion on each side. No appreciable interstitial edema. There is cardiomegaly with mild pulmonary venous hypertension. Pacemaker leads are attached to the right atrium and right ventricle. There is aortic atherosclerosis. No adenopathy. No bone lesions. IMPRESSION: Persistent cardiomegaly with pulmonary vascular congestion. Trace pleural effusions bilaterally. There may be a degree of underlying congestive heart failure. There is airspace opacity in the left lower lung region concerning for superimposed developing pneumonia. Pacemaker leads attached to right atrium and right ventricle. Aortic Atherosclerosis (ICD10-I70.0). Electronically Signed   By: Lowella Grip III M.D.   On: 10/04/2019 10:08   DG Chest 2 View  Result Date: 10/02/2019 CLINICAL DATA:  86 year old female with history of shortness of breath. EXAM: CHEST - 2 VIEW COMPARISON:  Chest x-ray 09/17/2019. FINDINGS: Lung volumes are decreased. There is cephalization of the pulmonary vasculature and slight indistinctness of the interstitial markings suggestive of mild pulmonary edema. Trace left pleural effusion. No right pleural effusion. Heart size is mildly enlarged. Upper mediastinal contours are within normal limits. Aortic atherosclerosis. Left-sided pacemaker device in place with lead tips projecting over the expected location of the  right atrium and right ventricle. IMPRESSION: 1. The appearance of the chest suggests mild congestive heart failure, as above. 2. Aortic atherosclerosis. Electronically Signed   By: Vinnie Langton M.D.   On: 10/02/2019 12:00   US RENAL  Result Date: 10/07/2019 CLINICAL DATA:  Acute kidney injury EXAM: RENAL / URINARY TRACT ULTRASOUND COMPLETE COMPARISON:  None. FINDINGS: Right Kidney: Renal measurements: 9.2 x 5.0 x 4.2 cm = volume: 101.0 mL. Echogenicity is increased. Renal cortical thickness is low normal. No perinephric fluid or hydronephrosis visualized. There is a cyst in the upper to mid right kidney measuring 1.1 x 1.2 x 1.4 cm. No sonographically demonstrable calculus or ureterectasis. Left Kidney: Renal measurements: 9.5 x 4.3 x 4.1 cm = volume: 86.1 mL. Echogenicity is increased. The renal cortical thickness is low. No perinephric fluid or hydronephrosis visualized. There is a cyst in the upper pole left kidney measuring 1.0 x 0.8 x 0.8 cm. There is a cyst in the lower pole left kidney measuring 1.0 x 0.7 x 0.9 cm. No sonographically demonstrable calculus or ureterectasis. Bladder: The urinary bladder wall thickness is normal. There is suspected mild debris in the dependent portion of the bladder. Flow from each distal ureter seen within the bladder. Other: None. IMPRESSION: 1. Increased renal echogenicity bilaterally, a finding indicative of medical renal disease. No obstructing focus in either kidney. 2.  Small renal cysts bilaterally. 3. Question debris within the bladder. Correlation with urinalysis in this regard advised. Electronically Signed   By: Lowella Grip III M.D.   On: 10/07/2019 09:14   DG Abd 2 Views  Result Date: 10/19/2019 CLINICAL DATA:  Epigastric pain EXAM: ABDOMEN - 2 VIEW COMPARISON:  None. FINDINGS: Scattered large and small bowel gas is noted. No obstructive changes seen. No free air is noted. Diffuse vascular calcifications are noted. No renal or ureteral stones are  seen. Degenerative change of lumbar spine with scoliosis concave to the right is noted. No acute abnormality noted. IMPRESSION: Chronic changes as described without acute abnormality. Electronically Signed   By: Inez Catalina M.D.   On: 10/19/2019 11:03   DG Foot 2 Views Right  Result Date: 10/18/2019 CLINICAL DATA:  Pain and discoloration to the right great toe. Patient had a frozen pack of cheese fall on it. EXAM: RIGHT FOOT - 2  VIEW COMPARISON:  None. FINDINGS: No fracture. There is flattening of the medial aspect of the distal first metatarsal and the metatarsal head consistent with previous bunion surgery. The first metatarsophalangeal joint show significant narrowing with subchondral sclerosis. The base of the proximal phalanx of the great toe is narrowed which may be from erosions or from previous surgery. IP joint is normally aligned. Remaining joints are normally aligned. Bones are demineralized. There is soft tissue swelling of the great toe and of the forefoot most evident dorsally. Arterial vascular calcifications extend from the ankle to the distal foot and toes. IMPRESSION: 1. No fracture or dislocation. 2. Advanced arthropathic changes at the first metatarsophalangeal joint, most likely osteoarthritis. There are postsurgical changes consistent with a previous bunionectomy. 3. Great toe and forefoot soft tissue swelling. Electronically Signed   By: Lajean Manes M.D.   On: 10/18/2019 20:24      EGD and Colonoscopy   Subjective: Patient seen this AM, was initially sleepy.  Woke up to use bedside commode.  She reported freeling well.  No further bleeding.  No dizziness or lightheadedness.  No SOB.     Discharge Exam: Vitals:   10/23/19 0408 10/23/19 0807  BP: 120/63 (!) 108/48  Pulse: 83 71  Resp: 16 17  Temp: 98.1 F (36.7 C) 97.9 F (36.6 C)  SpO2: 98% 95%   Vitals:   10/22/19 1556 10/22/19 1935 10/23/19 0408 10/23/19 0807  BP: (!) 100/52 130/89 120/63 (!) 108/48  Pulse: 81  95 83 71  Resp:  '18 16 17  ' Temp:  98 F (36.7 C) 98.1 F (36.7 C) 97.9 F (36.6 C)  TempSrc:  Oral Oral Oral  SpO2:  99% 98% 95%  Weight:   83.1 kg   Height:        General: Pt is alert, awake, not in acute distress Cardiovascular: RRR, S1/S2 +, no rubs, no gallops Respiratory: CTA bilaterally, no wheezing, no rhonchi Abdominal: Soft, NT, ND, bowel sounds + Extremities: no edema, no cyanosis    The results of significant diagnostics from this hospitalization (including imaging, microbiology, ancillary and laboratory) are listed below for reference.     Microbiology: Recent Results (from the past 240 hour(s))  SARS Coronavirus 2 by RT PCR (hospital order, performed in [facility] hospital lab) Nasopharyngeal Nasopharyngeal Swab     Status: None   Collection Time: 10/18/19  6:43 PM   Specimen: Nasopharyngeal Swab  Result Value Ref Range Status   SARS Coronavirus 2 NEGATIVE NEGATIVE Final    Comment: (NOTE) SARS-CoV-2 target nucleic acids are NOT DETECTED.  The SARS-CoV-2 RNA is generally detectable in upper and lower respiratory specimens during the acute phase of infection. The lowest concentration of SARS-CoV-2 viral copies this assay can detect is 250 copies / mL. A negative result does not preclude SARS-CoV-2 infection and should not be used as the sole basis for treatment or other patient management decisions.  A negative result may occur with improper specimen collection / handling, submission of specimen other than nasopharyngeal swab, presence of viral mutation(s) within the areas targeted by this assay, and inadequate number of viral copies (<250 copies / mL). A negative result must be combined with clinical observations, patient history, and epidemiological information.  Fact Sheet for Patients:   StrictlyIdeas.no  Fact Sheet for Healthcare Providers: BankingDealers.co.za  This test is not yet approved or   cleared by the [place] FDA and has been authorized for detection and/or diagnosis of SARS-CoV-2 by FDA under an  Emergency Use Authorization (EUA).  This EUA will remain in effect (meaning this test can be used) for the duration of the COVID-19 declaration under Section 564(b)(1) of the Act, 21 U.S.C. section 360bbb-3(b)(1), unless the authorization is terminated or revoked sooner.  Performed at [facility], [address] [place] [address]      Labs: BNP (last 3 results) Recent Labs    10/04/19 0935 10/07/19 0627 10/18/19 1843  BNP 259.0* 154.1* 627.0*   Basic Metabolic Panel: Recent Labs  Lab 10/18/19 1843 10/18/19 1843 10/19/19 0634 10/20/19 0532 10/21/19 0508 10/22/19 0620 10/23/19 0826  NA 145   < > 145 144 141 138 136  K 3.5   < > 3.3* 3.7 4.2 3.8 3.8  CL 105   < > 105 106 104 103 101  CO2 30   < > 33* '31 29 27 26  ' GLUCOSE 141*   < > 171* 144* 114* 116* 98  BUN 42*   < > 37* 27* 29* 29* 28*  CREATININE 1.49*   < > 1.32* 1.08* 1.54* 1.65* 1.61*  CALCIUM 9.6   < > 9.8 9.6 9.9 9.1 9.6  MG 1.9  --  1.6* 2.2 2.4  --  2.4   < > = values in this interval not displayed.   Liver Function Tests: Recent Labs  Lab 10/18/19 1843  AST 20  ALT 19  ALKPHOS 57  BILITOT 0.6  PROT 5.8*  ALBUMIN 3.1*   No results for input(s): LIPASE, AMYLASE in the last 168 hours. No results for input(s): AMMONIA in the last 168 hours. CBC: Recent Labs  Lab 10/18/19 1843 10/18/19 1843 10/19/19 1454 10/20/19 0532 10/21/19 0508 10/22/19 0620 10/23/19 0826  WBC 6.3   < > 7.8 7.9 5.5 4.9 4.9  NEUTROABS 4.1  --   --  6.2  --   --   --   HGB 8.7*   < > 8.4* 7.7* 8.4* 7.8* 8.4*  HCT 27.5*   < > 27.7* 25.1* 28.4* 24.1* 26.7*  MCV 93.9   < > 97.2 96.9 99.3 93.8 93.4  PLT 293   < > 250 236 252 245 254   < > = values in this interval not displayed.   Cardiac Enzymes: No results for input(s): CKTOTAL, CKMB, CKMBINDEX, TROPONINI in the last 168  hours. BNP: Invalid input(s): POCBNP CBG: Recent Labs  Lab 10/21/19 2114 10/22/19 0757 10/22/19 1109 10/22/19 1619 10/22/19 2107  GLUCAP 174* 112* 82 122* 167*   D-Dimer No results for input(s): DDIMER in the last 72 hours. Hgb A1c No results for input(s): HGBA1C in the last 72 hours. Lipid Profile No results for input(s): CHOL, HDL, LDLCALC, TRIG, CHOLHDL, LDLDIRECT in the last 72 hours. Thyroid function studies No results for input(s): TSH, T4TOTAL, T3FREE, THYROIDAB in the last 72 hours.  Invalid input(s): FREET3 Anemia work up Recent Labs    10/20/19 1333  VITAMINB12 402   Urinalysis    Component Value Date/Time   COLORURINE YELLOW (A) 10/07/2019 1155   APPEARANCEUR CLOUDY (A) 10/07/2019 1155   LABSPEC 1.012 10/07/2019 1155   PHURINE 5.0 10/07/2019 1155   GLUCOSEU NEGATIVE 10/07/2019 1155   HGBUR SMALL (A) 10/07/2019 1155   BILIRUBINUR NEGATIVE 10/07/2019 [place] 10/07/2019 1155   PROTEINUR NEGATIVE 10/07/2019 1155   NITRITE NEGATIVE 10/07/2019 1155   LEUKOCYTESUR LARGE (A) 10/07/2019 1155   Sepsis Labs Invalid input(s): PROCALCITONIN,  WBC,  LACTICIDVEN Microbiology Recent Results (from the past  240 hour(s))  SARS Coronavirus 2 by RT PCR (hospital order, performed in [facility] hospital lab) Nasopharyngeal Nasopharyngeal Swab     Status: None   Collection Time: 10/18/19  6:43 PM   Specimen: Nasopharyngeal Swab  Result Value Ref Range Status   SARS Coronavirus 2 NEGATIVE NEGATIVE Final    Comment: (NOTE) SARS-CoV-2 target nucleic acids are NOT DETECTED.  The SARS-CoV-2 RNA is generally detectable in upper and lower respiratory specimens during the acute phase of infection. The lowest concentration of SARS-CoV-2 viral copies this assay can detect is 250 copies / mL. A negative result does not preclude SARS-CoV-2 infection and should not be used as the sole basis for treatment or other patient management decisions.  A negative result  may occur with improper specimen collection / handling, submission of specimen other than nasopharyngeal swab, presence of viral mutation(s) within the areas targeted by this assay, and inadequate number of viral copies (<250 copies / mL). A negative result must be combined with clinical observations, patient history, and epidemiological information.  Fact Sheet for Patients:   StrictlyIdeas.no  Fact Sheet for Healthcare Providers: BankingDealers.co.za  This test is not yet approved or  cleared by the [place] FDA and has been authorized for detection and/or diagnosis of SARS-CoV-2 by FDA under an Emergency Use Authorization (EUA).  This EUA will remain in effect (meaning this test can be used) for the duration of the COVID-19 declaration under Section 564(b)(1) of the Act, 21 U.S.C. section 360bbb-3(b)(1), unless the authorization is terminated or revoked sooner.  Performed at [facility], [address] [place], [place] [address]      Time coordinating discharge: Over 30 minutes  SIGNED:   Ezekiel Slocumb, DO [facility] 10/23/2019, 12:09 PM   If 7PM-7AM, please contact night-coverage [email or web address]

## 2019-10-23 NOTE — Evaluation (Signed)
Occupational Therapy Evaluation Patient Details Name: Evelyn Simmons MRN: [id number] DOB: Aug 09, 1929 Today's Date: 10/23/2019    History of Present Illness Per MD notes: Pt is an 86 y.o. female with medical history significant for  COPD and chronic respiratory failure on home O2 at 2-3 L/min, DM , systolic and diastolic CHF, atrial fibrillation not anti-coagualted secondary to GI bleed,  history of sick sinus syndrome s/p  pacemaker, HTN, HDL, and hypothyroidism who presents for assessment of 2 days of worsening cough and shortness of breath as well as soft tissue injury to R great toe after dropping a frozen block of cheeze on it.  MD assessment includes: acute on chronic systolic heart failure, Chronic kidney disease stage III/type 2 diabetes mellitus, black stools/anemia, abdominal discomfort, A-fib s/p pacemaker, hypokalemia, hypomagnesemia, HTN, R toe pain, and anxiety.   Clinical Impression   Evelyn Simmons was seen for OT evaluation this date. Prior to hospital admission, pt was MOD I for ADLs, requires assistance for IADLs. Pt lives alone c daughter available PRN, assisting for groceries. Pt presents to acute OT demonstrating impaired ADL performance and functional mobility 2/2 decreased safety awareness, functional strength/ROM deficits, and decreased activity tolerance. Pt currently requires SBA toileting at [facility], including perihygiene. SBA standing grooming tasks ~5 mins c BUE support through elbows. MAX A don B socks at bed level. Pt would benefit from skilled OT to address noted impairments and functional limitations (see below for any additional details) in order to maximize safety and independence while minimizing falls risk and caregiver burden. Upon hospital discharge, recommend HHOT to maximize pt safety and return to functional independence during meaningful occupations of daily life.     Follow Up Recommendations  Home health OT;Supervision - Intermittent    Equipment Recommendations   None recommended by OT    Recommendations for Other Services       Precautions / Restrictions Precautions Precautions: Fall Precaution Comments: O2 sats Restrictions Weight Bearing Restrictions: No      Mobility Bed Mobility Overal bed mobility: Needs Assistance Bed Mobility: Supine to Sit;Sit to Supine     Supine to sit: Modified independent (Device/Increase time) Sit to supine: Modified independent (Device/Increase time)   General bed mobility comments: Extra time and effort with sup to sit but no physical assistance required  Transfers Overall transfer level: Needs assistance Equipment used: None Transfers: Sit to/from [facility] Sit to Stand: Min guard Stand pivot transfers: Min guard       General transfer comment: SBA - pt refused RW, noted to furniture cruises    Balance Overall balance assessment: Needs assistance Sitting-balance support: No upper extremity supported;Feet supported Sitting balance-Leahy Scale: Normal     Standing balance support: Bilateral upper extremity supported;During functional activity Standing balance-Leahy Scale: Good        ADL either performed or assessed with clinical judgement   ADL Overall ADL's : Needs assistance/impaired      General ADL Comments: SBA toileting at [facility], including perihygiene. SBA standing grooming tasks ~5 mins c BUE support through elbows. MAX A don B socks at bed level                   Pertinent Vitals/Pain Pain Assessment: No/denies pain     Hand Dominance Right   Extremity/Trunk Assessment Upper Extremity Assessment Upper Extremity Assessment: Generalized weakness   Lower Extremity Assessment Lower Extremity Assessment: Generalized weakness       Communication Communication Communication: No difficulties   Cognition Arousal/Alertness: Awake/alert Behavior  During Therapy: WFL for tasks assessed/performed Overall Cognitive Status: Within Functional Limits for  tasks assessed           Exercises Exercises: Other exercises Other Exercises Other Exercises: Pt educated re: OT role, DME recs, d/c recs, HEP, importance of mobility for functional strengthening, ECS, falls prevention Other Exercises: LBD, toileting, sup<>sit, sit<>stand, SPT, sitting/standing balance/tolerance   Shoulder Instructions      Home Living Family/patient expects to be discharged to:: Private residence Living Arrangements: Alone Available Help at Discharge: Personal care attendant;Available PRN/intermittently;Family (DTR PRN) Type of Home: Apartment Home Access: Level entry     Home Layout: One level     Bathroom Shower/Tub: Walk-in Hydrologist: Standard     Home Equipment: Wheelchair - manual;Cane - single point;Walker - 4 wheels;Shower seat;Walker - 2 wheels;Bedside commode   Additional Comments: Pt is a [facility] participant.  Has a PCA 1x/wk for cleaning.      Prior Functioning/Environment Level of Independence: Independent with assistive device(s)        Comments: independent c bathing, dressing; no falls recently, generally confident with household distance.        OT Problem List: Decreased strength;Decreased knowledge of use of DME or AE;Decreased activity tolerance;Cardiopulmonary status limiting activity;Decreased range of motion      OT Treatment/Interventions: Self-care/ADL training;Therapeutic exercise;Patient/family education;Therapeutic activities;DME and/or AE instruction;Energy conservation;Balance training    OT Goals(Current goals can be found in the care plan section) Acute Rehab OT Goals Patient Stated Goal: To get stronger and get back home OT Goal Formulation: With patient Time For Goal Achievement: 11/06/19 Potential to Achieve Goals: Good ADL Goals Pt Will Perform Grooming: with modified independence;standing (c LRAD PRN) Pt Will Perform Lower Body Dressing: with modified independence;sit to/from stand (c  LRAD PRN) Pt Will Transfer to Toilet: with modified independence;ambulating;regular height toilet (c LRAD PRN)  OT Frequency: Min 1X/week   Barriers to D/C: Decreased caregiver support             AM-PAC OT "6 Clicks" Daily Activity     Outcome Measure Help from another person eating meals?: None Help from another person taking care of personal grooming?: A Little Help from another person toileting, which includes using toliet, bedpan, or urinal?: A Little Help from another person bathing (including washing, rinsing, drying)?: A Little Help from another person to put on and taking off regular upper body clothing?: None Help from another person to put on and taking off regular lower body clothing?: A Little 6 Click Score: 20   End of Session    Activity Tolerance: Patient tolerated treatment well Patient left: in bed;with call bell/phone within reach  OT Visit Diagnosis: Muscle weakness (generalized) (M62.81);Unsteadiness on feet (R26.81)                Time: 9449-6759 OT Time Calculation (min): 27 min Charges:  OT General Charges $OT Visit: 1 Visit OT Evaluation $OT Eval Moderate Complexity: 1 Mod OT Treatments $Self Care/Home Management : 23-37 mins  Dessie Coma, M.S. OTR/L  10/23/19, 1:06 PM  ascom [phone]

## 2019-10-23 NOTE — TOC Transition Note (Signed)
Transition of Care [facility]) - CM/SW Discharge Note   Patient Details  Name: Randell Detter MRN: [id number] Date of Birth: 07-Aug-1929  Transition of Care [facility]) CM/SW Contact:  Harriet Masson, RN Phone Number: 10/23/2019, 2:02 PM   Clinical Narrative:     RN spoke with the pt at bedside and educated on Substance Abuse and HF. Provided printable treatment centers and stress the importance of how substance abuse may affect her ongoing health. Further discussion on HF as pt is in the GREEN zone today with no residual symptoms. Discussed what to do if pt encounters HF symptoms related to SOB or swelling to her extremities with 3 lbs over night or 5 lbs within one week of fluid retension. Pt is a [facility] pt with provided services.  Attempted to called [facility] only able to leave a voice message concerning pt's pending discharge today. Verified with pt she has 7 tanks of home O2. Daughter was here but did not have pt's home O2 for transport. Daughter unable to transport and requested [facility] handles pt's transport on Monday.   Pt aware daughter has left the hospital and pt agreed to alternate method of transportation via EMS. Pt will not have help in the home until tomorrow with a [facility] aide pt report Gayla Medicus [phone]). RN attempted outreach to this source however only able to leave a HIPAA voice message requesting a call back.  Aide called back and verified she is with [facility] and would be visiting the pt tomorrow. Aware pt will discharge today.   Spoke directly with the pt and Dr. Arbutus Ped both agreed pt able to be safety discharged. Offered [facility] for transport as pt remains on O2. Pt again verified 7 full O2 tanks in the home however [facility] was able to contact the daughter Merilyn Baba) who will transport the pt home with the pt's home O2 provided. No further needs at this time. Other source of transportation cancelled.   No further needs at this time.     Final next level of care:  Home/Self Care Barriers to Discharge: No Barriers Identified   Patient Goals and CMS Choice        Discharge Placement                    Patient and family notified of of transfer: 10/23/19 (2x attempted unsuccessful to reach daughter. Pt states daughter will be transporting her home)  Discharge Plan and Services                                     Social Determinants of Health (SDOH) Interventions     Readmission Risk Interventions No flowsheet data found.

## 2019-10-23 NOTE — Progress Notes (Signed)
Patient discharged per orders. PIV and tele monitor removed from patient. CHF education and AVS reviewed and given to patient; expresses understanding. VSS and adequate for discharge. Patient transported via wheelchair by RN to daughter at main entrance.

## 2019-10-23 NOTE — Plan of Care (Signed)
  Problem: Education: Goal: Knowledge of General Education information will improve Description: Including pain rating scale, medication(s)/side effects and non-pharmacologic comfort measures Outcome: Adequate for Discharge   Problem: Health Behavior/Discharge Planning: Goal: Ability to manage health-related needs will improve Outcome: Adequate for Discharge   Problem: Safety: Goal: Ability to remain free from injury will improve Outcome: Adequate for Discharge

## 2019-10-23 NOTE — Progress Notes (Signed)
Small bowel capsule study report sent for scanning. Findings as per below:  SUMMARY Landmarks: First Gastric Image 00:02:04 First Duodenal Image 03:24:51 First Cecal Image 05:50:41  Findings: An area of gastric erythema was seen. The stomach was examined on patient's recent EGD this week and gastric erythema was reported on EGD as well.   From 04:14:40 to 04:29:33 mucus with mixed red material was seen in the small bowel. No lesions were seen to attribute to the red appearing mucus  RECOMMENDATIONS:  This exam did not find any small bowel lesions that could be clearly targeted for endoscopic treatment or hemostasis  The mucus mixed with red material is of unclear source  Would recommend serial CBCs  Would recommend IV iron replacement as necessary  If hemoglobin continues to drop despite above measures or if pt has active GI bleeding, further evaluation may include push enteroscopy, repeat pill camera study, vs RBC scan if active GI bleeding occurs.

## 2019-10-23 NOTE — Progress Notes (Signed)
Progress Note  Patient Name: Evelyn Simmons Date of Encounter: 10/23/2019  Primary Cardiologist: Caryl Comes  Subjective   No dyspnea, chest pain, or palpitations. Has ambulated without issues. Renal function is improving, though remains above baseline off Lasix. She was consuming large amounts of water a home prior to presentation. HGB low, though stable.   Inpatient Medications    Scheduled Meds: . aspirin EC  81 mg Oral Daily  . DULoxetine  30 mg Oral Daily  . guaiFENesin  1,200 mg Oral BID  . metoprolol tartrate  12.5 mg Oral BID  . pantoprazole  40 mg Oral Daily  . QUEtiapine  25 mg Oral BID  . rosuvastatin  10 mg Oral Daily  . sodium chloride flush  3 mL Intravenous Q12H  . traZODone  100 mg Oral QHS   Continuous Infusions: . sodium chloride 250 mL (10/22/19 2347)   PRN Meds: sodium chloride, acetaminophen, ipratropium-albuterol, ondansetron (ZOFRAN) IV, sodium chloride flush   Vital Signs    Vitals:   10/22/19 1556 10/22/19 1935 10/23/19 0408 10/23/19 0807  BP: (!) 100/52 130/89 120/63 (!) 108/48  Pulse: 81 95 83 71  Resp:  18 16 17   Temp:  98 F (36.7 C) 98.1 F (36.7 C) 97.9 F (36.6 C)  TempSrc:  Oral Oral Oral  SpO2:  99% 98% 95%  Weight:   83.1 kg   Height:        Intake/Output Summary (Last 24 hours) at 10/23/2019 1046 Last data filed at 10/23/2019 0950 Gross per 24 hour  Intake 1560 ml  Output 250 ml  Net 1310 ml   Filed Weights   10/21/19 1235 10/22/19 0341 10/23/19 0408  Weight: 81.4 kg 82.4 kg 83.1 kg    Telemetry    Afib with intermittent V pacing - Personally Reviewed  ECG    No new tracings - Personally Reviewed  Physical Exam   GEN: No acute distress.   Neck: No JVD. Cardiac: Irregularly irregular, no murmurs, rubs, or gallops.  Respiratory: Clear to auscultation bilaterally.  GI: Soft, nontender, non-distended.   MS: No edema; No deformity. Ecchymosis involving the right foot.  Neuro:  Alert and oriented x 3; Nonfocal.    Psych: Normal affect.  Labs    Chemistry Recent Labs  Lab 10/18/19 1843 10/19/19 9030 10/21/19 0508 10/22/19 0620 10/23/19 0826  NA 145   < > 141 138 136  K 3.5   < > 4.2 3.8 3.8  CL 105   < > 104 103 101  CO2 30   < > 29 27 26   GLUCOSE 141*   < > 114* 116* 98  BUN 42*   < > 29* 29* 28*  CREATININE 1.49*   < > 1.54* 1.65* 1.61*  CALCIUM 9.6   < > 9.9 9.1 9.6  PROT 5.8*  --   --   --   --   ALBUMIN 3.1*  --   --   --   --   AST 20  --   --   --   --   ALT 19  --   --   --   --   ALKPHOS 57  --   --   --   --   BILITOT 0.6  --   --   --   --   GFRNONAA 31*   < > 30* 27* 28*  GFRAA 36*   < > 35* 32* 33*  ANIONGAP 10   < >  8 8 9    < > = values in this interval not displayed.     Hematology Recent Labs  Lab 10/21/19 0508 10/22/19 0620 10/23/19 0826  WBC 5.5 4.9 4.9  RBC 2.86* 2.57* 2.86*  HGB 8.4* 7.8* 8.4*  HCT 28.4* 24.1* 26.7*  MCV 99.3 93.8 93.4  MCH 29.4 30.4 29.4  MCHC 29.6* 32.4 31.5  RDW 16.9* 16.9* 17.0*  PLT 252 245 254    Cardiac EnzymesNo results for input(s): TROPONINI in the last 168 hours. No results for input(s): TROPIPOC in the last 168 hours.   BNP Recent Labs  Lab 10/18/19 1843  BNP 377.8*     DDimer No results for input(s): DDIMER in the last 168 hours.   Radiology    No results found.  Cardiac Studies   2D echo 09/2019: 1. Left ventricular ejection fraction, by estimation, is 45 to 50%. The  left ventricle has mildly decreased function. The left ventricle  demonstrates global hypokinesis. There is mild concentric left ventricular  hypertrophy. Left ventricular diastolic  parameters are indeterminate. Elevated left ventricular end-diastolic  pressure.  2. Right ventricular systolic function is mildly reduced. The right  ventricular size is normal. There is mildly elevated pulmonary artery  systolic pressure.  3. Left atrial size was severely dilated.  4. Right atrial size was mildly dilated.  5. The mitral valve is  normal in structure. Mild mitral valve  regurgitation. No evidence of mitral stenosis.  6. The aortic valve is tricuspid. Aortic valve regurgitation is not  visualized. No aortic stenosis is present.  7. The inferior vena cava is normal in size with <50% respiratory  variability, suggesting right atrial pressure of 8 mmHg.  Patient Profile     86 y.o. female with history of CAD status post remote PCI to the LAD followed by PCI/stenting to the RCA in 01/2010, sick sinus syndrome s/p [employer] PPM in 2014, persistent Afib previously on Eliquis which was subsequently discontinued secondary to bleeding requiring transfusions and iron infusions currently on aspirin and amiodarone, HFrEF, anemia, DM2, CKD stage III, HTN, HLD, chronic hypoxic respiratory failure on supplemental oxygen via nasal cannula at 3 L secondary to COPD secondary to prior tobacco use, and GERD who is being seen today for the evaluation of acute on chronic HFrEF.  Assessment & Plan    1. Acute hypoxic respiratory distress: -Multifactorial including cardiomyopathy, anemia, morbid obesity, and deconditioning  -Improved  2. HFrEF: -Euvolemic to dehydrated and well compensated -Continue to hold Lasix until 9/13 likely in the setting of AKI with 40 mg daily and an extra 40 mg prn weight gain -Recent echo as above -Consider paramedicine service   3. CAD involving the native coronary arteries without angina: -No symptoms concerning for angina -High sensitivity troponin negative x 2 this admission  -Lopressor -ASA -Crestor  -No indication for inpatient ischemic work up  4. Persistent Afib: -Ventricular rates well controlled -Continue metoprolol -CHADS2VASc at least 7 (CHF, HTN, age x 2, DM, vascular disease, gender) -Historically, she has declined [place] given history of GI bleeds  5. Anemia: -EGD/small bowel capsule study with with region of gastric erythema and no indication for intervention  -Low, though  stable -Status post iron infusion  -Monitor   6. Acute on CKD stage III: -Renal function improving, though does remain above her baseline -Holding Lasix as outlined above -Follow up with PCP  7. Sinus node dysfunction: -PPM functioning normally  -Followed by EP  8. HTN: -BP well controlled -Continue  current regimen  9. HLD: -LDL 70 from 09/2019 -Crestor   For questions or updates, please contact [place] Please consult [email or web address] for contact info under Cardiology/STEMI.    Signed, Christell Faith, PA-C [place] Pager: [phone] 10/23/2019, 10:46 AM

## 2019-10-24 NOTE — Anesthesia Postprocedure Evaluation (Addendum)
Anesthesia Post Note  Patient: Hazley Macha  Procedure(s) Performed: ESOPHAGOGASTRODUODENOSCOPY (EGD) WITH PROPOFOL (N/A ) COLONOSCOPY WITH PROPOFOL (N/A )  Patient location during evaluation: Endoscopy Anesthesia Type: General Level of consciousness: awake and alert Pain management: pain level controlled Vital Signs Assessment: post-procedure vital signs reviewed and stable Respiratory status: spontaneous breathing, nonlabored ventilation, respiratory function stable and patient connected to nasal cannula oxygen Cardiovascular status: blood pressure returned to baseline and stable Postop Assessment: no apparent nausea or vomiting Anesthetic complications: no   No complications documented.   Last Vitals:  Vitals:   10/23/19 1216 10/23/19 1556  BP: (!) 103/52 120/66  Pulse: 73 89  Resp: 19 20  Temp: 36.8 C 36.8 C  SpO2: 96% 97%    Last Pain:  Vitals:   10/23/19 1556  TempSrc: Oral  PainSc:                  Alphonsus Sias

## 2019-10-25 ENCOUNTER — Telehealth: Payer: Self-pay

## 2019-10-25 ENCOUNTER — Telehealth: Payer: Self-pay | Admitting: Internal Medicine

## 2019-10-25 ENCOUNTER — Encounter: Payer: Self-pay | Admitting: Gastroenterology

## 2019-10-25 LAB — SURGICAL PATHOLOGY

## 2019-10-25 NOTE — Transfer of Care (Signed)
Immediate Anesthesia Transfer of Care Note  Patient: Khamiya Kunkle  Procedure(s) Performed: ESOPHAGOGASTRODUODENOSCOPY (EGD) WITH PROPOFOL (N/A ) COLONOSCOPY WITH PROPOFOL (N/A )  Patient Location: PACU and Endoscopy Unit  Anesthesia Type:General  Level of Consciousness: sedated  Airway & Oxygen Therapy: Patient Spontanous Breathing and Patient connected to nasal cannula oxygen  Post-op Assessment: Report given to RN and Post -op Vital signs reviewed and stable  Post vital signs: Reviewed and stable  Last Vitals:  Vitals Value Taken Time  BP 120/66 10/23/19 1556  Temp 36.8 C 10/23/19 1556  Pulse 89 10/23/19 1556  Resp 20 10/23/19 1556  SpO2 97 % 10/23/19 1556    Last Pain:  Vitals:   10/23/19 1556  TempSrc: Oral  PainSc:       Patients Stated Pain Goal: 0 (99/37/16 9678)  Complications: No complications documented.

## 2019-10-25 NOTE — Telephone Encounter (Signed)
Attempted to schedule tcm visit with [facility] ( patient prefers this )   Unable to obtain permission or schedule an appt. At this time .  Patient pcp Dr. Lavone Neri will call back to discuss .     Per [facility] program non [facility] providers require authorization prior to DOS.    Previous attempts to obtain an auth for the last TCM appt with Dr. Caryl Comes ( currently on schedule ) have been made but no one has ever called back to discuss.    Patient currently scheduled for 10-5 with Dr. Caryl Comes but needs sooner .

## 2019-10-26 ENCOUNTER — Encounter: Payer: Self-pay | Admitting: Gastroenterology

## 2019-10-26 ENCOUNTER — Telehealth: Payer: Self-pay

## 2019-10-26 ENCOUNTER — Other Ambulatory Visit: Payer: Self-pay

## 2019-10-26 DIAGNOSIS — D509 Iron deficiency anemia, unspecified: Secondary | ICD-10-CM

## 2019-10-26 NOTE — Progress Notes (Signed)
Did referral to hematology per dr Marius Ditch request on colonoscopy

## 2019-10-26 NOTE — Telephone Encounter (Signed)
Called and left a message for call back  

## 2019-10-26 NOTE — Telephone Encounter (Signed)
Patient needs hospital follow up appointment also. Called and left a message for call back

## 2019-10-26 NOTE — Telephone Encounter (Signed)
Ok to offer an appointment on Tuesday 9/21 at 8:20 am with Dr. Caryl Comes.

## 2019-10-26 NOTE — Telephone Encounter (Signed)
-----   Message from Lin Landsman, MD sent at 10/26/2019 10:07 AM EDT ----- The pathology results from recent colonoscopy showed that the polyps were benign.  Recommend surveillance colonoscopy in 3 years based on her overall medical condition at that time  [facility]

## 2019-10-27 NOTE — Telephone Encounter (Signed)
Called and left a message for call back. Sent letter  

## 2019-10-27 NOTE — Telephone Encounter (Signed)
Scheduled below for tcm .  Advised [facility] per patient request and again asked for an auth.

## 2019-10-28 ENCOUNTER — Encounter: Payer: Self-pay | Admitting: Gastroenterology

## 2019-11-02 ENCOUNTER — Other Ambulatory Visit: Payer: Self-pay

## 2019-11-02 ENCOUNTER — Encounter: Payer: Self-pay | Admitting: Internal Medicine

## 2019-11-02 ENCOUNTER — Ambulatory Visit (INDEPENDENT_AMBULATORY_CARE_PROVIDER_SITE_OTHER): Payer: Medicare (Managed Care) | Admitting: Internal Medicine

## 2019-11-02 ENCOUNTER — Encounter: Payer: Medicare (Managed Care) | Admitting: Internal Medicine

## 2019-11-02 ENCOUNTER — Ambulatory Visit: Payer: TRICARE For Life (TFL) | Admitting: Family

## 2019-11-02 VITALS — BP 100/70 | HR 102 | Ht 59.0 in | Wt 180.0 lb

## 2019-11-02 DIAGNOSIS — I495 Sick sinus syndrome: Secondary | ICD-10-CM

## 2019-11-02 DIAGNOSIS — Z950 Presence of cardiac pacemaker: Secondary | ICD-10-CM | POA: Diagnosis not present

## 2019-11-02 DIAGNOSIS — I4819 Other persistent atrial fibrillation: Secondary | ICD-10-CM | POA: Diagnosis not present

## 2019-11-02 MED ORDER — AMIODARONE HCL 200 MG PO TABS: 200.0000 mg | ORAL_TABLET | Freq: Two times a day (BID) | ORAL | 3 refills | Status: DC

## 2019-11-02 NOTE — Progress Notes (Signed)
Patient Care Team: Dionicia Abler, NP as PCP - General Deboraha Sprang, MD as PCP - Cardiology (Cardiology)   HPI  Evelyn Simmons is a 86 y.o. 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 She treated with iron replacement therapy. Takes amiodarone  History of coronary artery disease with stenting in 1995. Last underwent catheterization 12/11-- high-grade stenosis of her RCA >>stented.    Two  Hospitalizations since 8/21 for A/C HFpEF with afib persistently since 7/21--not on anticoagulation 2/2 GI bleeding   Afib since July  Feels jittery inside Weak and with more dyspnea and lightheadedness Edema  Diet replete of sodium but not so much of fluid--taking diuretics       Date Cr K TSH LFTs Hgb PFTs  3/16  1.13 4.5 2.75 12     2/17       9.3    7/19 1.1 4.0 1.45 19 10.9   9/21 1.61 3.8   8.4<7.4    DATE TEST EF   12/11 LHC  RCA stented   12/13 TEE 45%   8/17 Echo   >55 % AS mild (mean Grad 13)  8/21 Echo  45-50% LAE-severe  AS mild  (mG-65m)          Past Medical History:  Diagnosis Date  . 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 09/2019 Echo: EF 45-50%, glob HK, mild conc LVH, mildly elev PASP. Sev dil LA. Mildly dil RA. Mild MR.  .Marland KitchenAnemia   . Arthritis   . Asthma   . CAD (coronary artery disease)    a. 1995 s/p PCI/BMS LAD; b. 2011 s/p PCI/DES RCA.  .Marland KitchenCHF (congestive heart failure) ([place]   . Chronic kidney disease   . COPD (chronic obstructive pulmonary disease) ([place]   . Depression   . Diabetes mellitus without complication ([place]   . Diverticulitis   . Dysrhythmia   . GERD (gastroesophageal reflux disease)   . Hyperlipidemia   . Hypertension   . Hypothyroid    pt unsure, not currently on medication  . Ischemic cardiomyopathy     . MI (myocardial infarction) ([place]    1995  . Persistent atrial fibrillation ([place]    a. No OAC 2/2 h/o bleeding/anemia; b. Longterm amio therapy; c. 08/2019 - recurrent afib-->rate-controlled.  . Presence of permanent cardiac pacemaker   . Radiculopathy     Past Surgical History:  Procedure Laterality Date  . ABDOMINAL HYSTERECTOMY    . APPENDECTOMY    . CARDIAC CATHETERIZATION    . COLONOSCOPY WITH PROPOFOL N/A 10/21/2019   Procedure: COLONOSCOPY WITH PROPOFOL;  Surgeon: VLin Landsman MD;  [facility];  Service: Gastroenterology;  Laterality: N/A;  . CORONARY ANGIOPLASTY WITH STENT PLACEMENT  12/11/1993   RCA  . ESOPHAGOGASTRODUODENOSCOPY (EGD) WITH PROPOFOL N/A 10/21/2019   Procedure: ESOPHAGOGASTRODUODENOSCOPY (EGD) WITH PROPOFOL;  Surgeon: VLin Landsman MD;  [place]  Service: Gastroenterology;  Laterality: N/A;  . FOOT SURGERY Bilateral    Over 25 years ago- Bunionectomy  . GIVENS CAPSULE STUDY N/A 10/22/2019   Procedure: GIVENS CAPSULE STUDY;  Surgeon: VLin Landsman MD;  [facility];  Service: Gastro[facility]ology;  Laterality: N/A;  . INSERT / REPLACE / REMOVE PACEMAKER  09/2012   Model # [id number] serial #  [id number]  . JOINT REPLACEMENT     bilateral knee replacements    Current Outpatient Medications  Medication Sig Dispense Refill  . aspirin EC 81 MG tablet Take 81 mg by mouth daily.    . Blood Glucose Monitoring Suppl (ACCU-CHEK NANO SMARTVIEW) W/DEVICE KIT Please issue nano meter by AccuChek along with test strips at checking frequency of 2x daily. 90 day supply. E11.9 1 kit 1  . carboxymethylcellul-glycerin (OPTIVE) 0.5-0.9 % ophthalmic solution Place 1 drop into both eyes in the morning, at noon, and at bedtime.    . cholecalciferol (VITAMIN D) 1000 units tablet Take 1,000 Units by mouth daily.    . diclofenac Sodium (VOLTAREN) 1 % GEL Apply topically 4 (four) times daily as needed.    . DULoxetine (CYMBALTA) 30 MG  capsule Take 30 mg by mouth daily.    . ferrous sulfate 325 (65 FE) MG tablet Take 1 tablet (325 mg total) by mouth 2 (two) times daily with a meal. 60 tablet 1  . glucose blood (FREESTYLE LITE) test strip Use as instructed 200 each 3  . ipratropium-albuterol (DUONEB) 0.5-2.5 (3) MG/3ML SOLN Take 3 mLs by nebulization.    . Lancets (FREESTYLE) lancets Use as instructed 200 each 3  . loratadine (CLARITIN) 10 MG tablet Take 10 mg by mouth daily as needed for allergies.    Marland Kitchen METOPROLOL TARTRATE PO Take 25 mg by mouth in the morning and at bedtime.    . Multiple Vitamins-Minerals (PRESERVISION AREDS 2+MULTI VIT) CAPS Take 1 capsule by mouth 2 (two) times daily.    . naloxone (NARCAN) nasal spray 4 mg/0.1 mL Place 1 spray into the nose once.    . nitroGLYCERIN (NITROSTAT) 0.4 MG SL tablet Place 0.4 mg under the tongue every 5 (five) minutes as needed for chest pain.    . pantoprazole (PROTONIX) 20 MG tablet Take 20 mg by mouth daily.    . polyethylene glycol (MIRALAX / GLYCOLAX) 17 g packet Take 17 g by mouth daily as needed.    Marland Kitchen QUEtiapine (SEROQUEL) 25 MG tablet Take 25 mg by mouth 2 (two) times daily.    . rosuvastatin (CRESTOR) 40 MG tablet Take 1 tablet (40 mg total) by mouth daily. 90 tablet 3  . Sennosides-Docusate Sodium (SENEXON-S PO) Take by mouth. Take two tablets by mouth once daily    . sodium chloride (OCEAN) 0.65 % SOLN nasal spray Place 1 spray into both nostrils as needed for congestion.    . torsemide (DEMADEX) 20 MG tablet Take 20 mg by mouth 2 (two) times daily.    . traMADol (ULTRAM) 50 MG tablet Take 50 mg by mouth 2 (two) times daily as needed for severe pain.    . traZODone (DESYREL) 100 MG tablet Take 100 mg by mouth at bedtime.    . vitamin B-12 1000 MCG tablet Take 1 tablet (1,000 mcg total) by mouth daily. 30 tablet 1  . Zinc Oxide 13 % CREA Apply topically in the morning, at noon, and at bedtime.    Marland Kitchen albuterol (PROVENTIL HFA;VENTOLIN HFA) 108 (90 Base) MCG/ACT inhaler  Inhale 2 puffs into the lungs every 6 (six) hours as needed for wheezing or shortness of breath.    . metoprolol tartrate (LOPRESSOR) 25 MG tablet Take 0.5 tablets (12.5 mg total) by mouth 2 (two) times daily. (Patient taking differently: Take 25 mg by mouth 2 (two) times daily. ) 60 tablet 2   No current facility-administered medications for this visit.  Allergies  Allergen Reactions  . Penicillins Nausea And Vomiting, Rash and Hives  . Other Other (See Comments)  . Sulfa Antibiotics     Pt unsure reaction, was told in hospital she had allergy  . Penicillin G Rash      Review of Systems negative except from HPI and PMH  Physical Exam BP 100/70 (BP Location: Right Arm, Patient Position: Sitting, Cuff Size: Large)   Pulse (!) 102   Ht '4\' 11"'  (1.499 m)   Wt 180 lb (81.6 kg)   SpO2 98% Comment: on 2L O2  BMI 36.36 kg/m  Well developed and nourished in no acute distress sitting in a wheel chair HENT normal Neck supple with JVP-8-10 Carotids brisk and full without bruits Clear Irregularly irregular rate and rhythm with rapid ventricular response, no murmurs or gallops Abd-soft with active BS without hepatomegaly No Clubbing cyanosis 1-2+edema Skin-warm and dry A & Oriented  Grossly normal sensory and motor function   ECG Afib at 102 with intermittent ventricular pacing    Assessment and  Plan  Sinus node dysfunction  Pacemaker-[place]  Atrial fibrillation  Hypertension   Ischemic heart disease with prior stenting  HFpEF  Anemia  COPD home O2  Loss of vision left eye  Without symptoms of ischemia.  On aspirin.  Anemia an issue.  On iron.  Quite uncomfortable with a sense of jitteriness.  She is now in persistent atrial fibrillation dating back to July.  I suspect that this is contributing to narrowing the symptom but also her intercurrent hospitalizations for acute on chronic HFpEF.  We discussed treatment options including efforts to anticoagulate and  cardiovert.  We discussed the potential downsides of antiarrhythmic drug therapy, anticoagulants and some likelihood for recurrent symptoms.  We further discussed AV junction ablation to which she was initially averse but the idea that it is relatively certain of being effective aches is quite appealing.  Given the Covid situation, however, and the fact that she lives by herself it would be necessary for her to spend 1 night and hence we will delay it for now.  In the interim, we will use amiodarone as a rate controlling drug

## 2019-11-02 NOTE — Telephone Encounter (Signed)
Patient has an appointment today

## 2019-11-02 NOTE — Patient Instructions (Addendum)
Medication Instructions:  - Your physician has recommended you make the following change in your medication:   1) START amiodarone 200 mg- take 1 tablet by mouth twice daily   *If you need a refill on your cardiac medications before your next appointment, please call your pharmacy*   Lab Work: 1) Pre procedure lab work: Thursday 12/09/19 at your office visit with Dr. Caryl Comes.  - BMP/ CBC   2) Pre procedure COVID swab: Thursday 12/09/19 (8:00 am- 1:00 pm)  - [facility] - this is a drive up test only & staff will come out to the car to swab you   If you have labs (blood work) drawn today and your tests are completely normal, you will receive your results only by: Marland Kitchen MyChart Message (if you have MyChart) OR . A paper copy in the mail If you have any lab test that is abnormal or we need to change your treatment, we will call you to review the results.   Testing/Procedures: - Your physician has recommended that you have an AV node ablation. Catheter ablation is a medical procedure used to treat some cardiac arrhythmias (irregular heartbeats). During catheter ablation, a long, thin, flexible tube is put into a blood vessel in your groin (upper thigh), or neck. This tube is called an ablation catheter. It is then guided to your heart through the blood vessel. Radio frequency waves destroy small areas of heart tissue where abnormal heartbeats may cause an arrhythmia to start.   - Monday December 13, 2019 - This will be done are [place] time should be around 9:30 am (for a 11:30 am procedure) - Your detailed instructions will be mailed to you at a later date    Follow-Up: At [facility], you and your health needs are our priority.  As part of our continuing mission to provide you with exceptional heart care, we have created designated Provider Care Teams.  These Care Teams include your primary Cardiologist (physician) and Advanced Practice Providers  (APPs -  Physician Assistants and Nurse Practitioners) who all work together to provide you with the care you need, when you need it.  We recommend signing up for the patient portal called "MyChart".  Sign up information is provided on this After Visit Summary.  MyChart is used to connect with patients for Virtual Visits (Telemedicine).  Patients are able to view lab/test results, encounter notes, upcoming appointments, etc.  Non-urgent messages can be sent to your provider as well.   To learn more about what you can do with MyChart, go to [email or web address].    Your next appointment:   Thursday 12/09/19 at 8:20 am  The format for your next appointment:   In Person  Provider:   Virl Axe, MD   Other Instructions N/A

## 2019-11-05 LAB — CUP PACEART INCLINIC DEVICE CHECK
Brady Statistic RA Percent Paced: 57 %
Brady Statistic RV Percent Paced: 5 %
Date Time Interrogation Session: 20210921000000
Implantable Lead Implant Date: 20140813
Implantable Lead Implant Date: 20140813
Implantable Lead Location: 753859
Implantable Lead Location: 753860
Implantable Lead Model: 4469
Implantable Lead Model: 4470
Implantable Lead Serial Number: 581162
Implantable Lead Serial Number: 755425
Implantable Pulse Generator Implant Date: 20140813
Lead Channel Impedance Value: 352 Ohm
Lead Channel Impedance Value: 389 Ohm
Lead Channel Pacing Threshold Amplitude: 0.5 V
Lead Channel Pacing Threshold Amplitude: 0.9 V
Lead Channel Pacing Threshold Pulse Width: 0.4 ms
Lead Channel Pacing Threshold Pulse Width: 0.5 ms
Lead Channel Sensing Intrinsic Amplitude: 0.7 mV
Lead Channel Sensing Intrinsic Amplitude: 8.1 mV
Lead Channel Setting Pacing Amplitude: 2 V
Lead Channel Setting Pacing Amplitude: 2.5 V
Lead Channel Setting Pacing Pulse Width: 0.4 ms
Lead Channel Setting Sensing Sensitivity: 2.5 mV
Pulse Gen Serial Number: 161924

## 2019-11-16 ENCOUNTER — Encounter: Payer: Medicare (Managed Care) | Admitting: Internal Medicine

## 2019-11-23 ENCOUNTER — Encounter: Payer: Self-pay | Admitting: *Deleted
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
## 2019-11-30 ENCOUNTER — Telehealth: Payer: Self-pay | Admitting: Family

## 2019-11-30 ENCOUNTER — Ambulatory Visit: Payer: Medicare (Managed Care) | Admitting: Family

## 2019-11-30 NOTE — Telephone Encounter (Signed)
Patient did not show for her Heart Failure Clinic appointment on 11/30/19. Will attempt to reschedule.

## 2019-12-03 ENCOUNTER — Telehealth: Payer: Self-pay | Admitting: Family

## 2019-12-03 NOTE — Telephone Encounter (Signed)
LVM with patient in attempt to reschedule a no show CHF Clinic appointment.   Evelyn Simmons, NT

## 2019-12-09 ENCOUNTER — Ambulatory Visit (INDEPENDENT_AMBULATORY_CARE_PROVIDER_SITE_OTHER): Payer: Medicare (Managed Care) | Admitting: Internal Medicine

## 2019-12-09 ENCOUNTER — Other Ambulatory Visit
Admission: RE | Admit: 2019-12-09 | Discharge: 2019-12-09 | Disposition: A | Discharge status: Home / Self Care | Payer: Medicare (Managed Care) | Source: Ambulatory Visit | Attending: Internal Medicine | Admitting: Internal Medicine

## 2019-12-09 ENCOUNTER — Encounter: Payer: Self-pay | Admitting: Internal Medicine

## 2019-12-09 ENCOUNTER — Other Ambulatory Visit: Payer: Self-pay

## 2019-12-09 VITALS — HR 75 | Ht 59.0 in | Wt 172.4 lb

## 2019-12-09 DIAGNOSIS — Z01812 Encounter for preprocedural laboratory examination: Secondary | ICD-10-CM

## 2019-12-09 DIAGNOSIS — I10 Essential (primary) hypertension: Secondary | ICD-10-CM

## 2019-12-09 DIAGNOSIS — Z950 Presence of cardiac pacemaker: Secondary | ICD-10-CM | POA: Insufficient documentation

## 2019-12-09 DIAGNOSIS — I4819 Other persistent atrial fibrillation: Secondary | ICD-10-CM | POA: Diagnosis not present

## 2019-12-09 DIAGNOSIS — I495 Sick sinus syndrome: Secondary | ICD-10-CM | POA: Insufficient documentation

## 2019-12-09 DIAGNOSIS — Z20822 Contact with and (suspected) exposure to covid-19: Secondary | ICD-10-CM | POA: Insufficient documentation

## 2019-12-09 DIAGNOSIS — I5022 Chronic systolic (congestive) heart failure: Secondary | ICD-10-CM

## 2019-12-09 DIAGNOSIS — I110 Hypertensive heart disease with heart failure: Secondary | ICD-10-CM | POA: Insufficient documentation

## 2019-12-09 NOTE — Progress Notes (Signed)
Patient Care Team: Dionicia Abler, NP as PCP - General Deboraha Sprang, MD as PCP - Cardiology (Cardiology)   HPI  Evelyn Simmons is a 86 y.o. 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 She treated with iron replacement therapy. Takes amiodarone-- but now with persistent AFib since 7/21 with two interval hospitalizations 2/2 A/C HFpEF  Discussed at last visit AV ablation as felt not to be candidate for anticoagulation 2/2 GI bleeding; chronic anemia with Fe sat at 6%   History of coronary artery disease with stenting in 1995. Last underwent catheterization 12/11-- high-grade stenosis of her RCA >>stented.     Chronic fatigue, O2 so dyspneic No edema or chest pain  Jitters are improved but still some  C/o dry mouth and eyes   Date Cr K TSH LFTs Hgb PFTs  3/16  1.13 4.5 2.75 12     2/17       9.3    7/19 1.1 4.0 1.45 19 10.9   9/21 1.61 3.8   8.4<7.4    DATE TEST EF   12/11 LHC  RCA stented   12/13 TEE 45%   8/17 Echo   >55 % AS mild (mean Grad 13)  8/21 Echo  45-50% LAE-severe  AS mild  (mG-59m)          Past Medical History:  Diagnosis Date  . 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 09/2019 Echo: EF 45-50%, glob HK, mild conc LVH, mildly elev PASP. Sev dil LA. Mildly dil RA. Mild MR.  .Marland KitchenAnemia   . Arthritis   . Asthma   . CAD (coronary artery disease)    a. 1995 s/p PCI/BMS LAD; b. 2011 s/p PCI/DES RCA.  .Marland KitchenCHF (congestive heart failure) (HC[facility]   . Chronic kidney disease   . COPD (chronic obstructive pulmonary disease) ([place]rwin   . Depression   . Diabetes mellitus without complication ([facility]   . Diverticulitis   . Dysrhythmia   . GERD (gastroesophageal reflux disease)   . Hyperlipidemia   . Hypertension   . Hypothyroid    pt unsure,  not currently on medication  . Ischemic cardiomyopathy   . MI (myocardial infarction) ([facility] [place]    1995  . Persistent atrial fibrillation ([facility] [place]    a. No OAC 2/2 h/o bleeding/anemia; b. Longterm amio therapy; c. 08/2019 - recurrent afib-->rate-controlled.  . Presence of permanent cardiac pacemaker   . Radiculopathy     Past Surgical History:  Procedure Laterality Date  . ABDOMINAL HYSTERECTOMY    . APPENDECTOMY    . CARDIAC CATHETERIZATION    . COLONOSCOPY WITH PROPOFOL N/A 10/21/2019   Procedure: COLONOSCOPY WITH PROPOFOL;  Surgeon: VLin Landsman MD;  [facility]  Service: Gastroenterology;  Laterality: N/A;  . CORONARY ANGIOPLASTY WITH STENT PLACEMENT  12/11/1993   RCA  . ESOPHAGOGASTRODUODENOSCOPY (EGD) WITH PROPOFOL N/A 10/21/2019   Procedure: ESOPHAGOGASTRODUODENOSCOPY (EGD) WITH PROPOFOL;  Surgeon: VLin Landsman MD;  [facility]  Service: Gastroenterology;  Laterality: N/A;  . FOOT SURGERY Bilateral    Over 25 years ago- Bunionectomy  . GIVENS CAPSULE STUDY N/A 10/22/2019   Procedure: GIVENS CAPSULE STUDY;  Surgeon: VLin Landsman MD;  [facility];  Service: Gastroenterology;  Laterality: N/A;  . INSERT / REPLACE /  REMOVE PACEMAKER  09/2012   Model # X323  serial # [id number]  . JOINT REPLACEMENT     bilateral knee replacements    Current Outpatient Medications  Medication Sig Dispense Refill  . albuterol (PROVENTIL HFA;VENTOLIN HFA) 108 (90 Base) MCG/ACT inhaler Inhale 2 puffs into the lungs every 6 (six) hours as needed for wheezing or shortness of breath.    Marland Kitchen amiodarone (PACERONE) 200 MG tablet Take 1 tablet (200 mg total) by mouth 2 (two) times daily. 60 tablet 3  . aspirin EC 81 MG tablet Take 81 mg by mouth daily.    . Blood Glucose Monitoring Suppl (ACCU-CHEK NANO SMARTVIEW) W/DEVICE KIT Please issue nano meter by AccuChek along with test strips at checking frequency of 2x daily. 90 day supply. E11.9 1 kit 1  .  carboxymethylcellul-glycerin (OPTIVE) 0.5-0.9 % ophthalmic solution Place 1 drop into both eyes in the morning, at noon, and at bedtime.    . cholecalciferol (VITAMIN D) 1000 units tablet Take 1,000 Units by mouth daily.    . diclofenac Sodium (VOLTAREN) 1 % GEL Apply 1 application topically 4 (four) times daily as needed (left knee pain.).     Marland Kitchen DULoxetine (CYMBALTA) 30 MG capsule Take 30 mg by mouth daily.    . ferrous sulfate 325 (65 FE) MG tablet Take 1 tablet (325 mg total) by mouth 2 (two) times daily with a meal. 60 tablet 1  . furosemide (LASIX) 20 MG tablet Take 20 mg by mouth daily as needed for edema.    Marland Kitchen glucose blood (FREESTYLE LITE) test strip Use as instructed 200 each 3  . Lancets (FREESTYLE) lancets Use as instructed 200 each 3  . lidocaine (LIDODERM) 5 % Place 1 patch onto the skin daily as needed (knee pain.). Remove & Discard patch within 12 hours or as directed by MD    . loratadine (CLARITIN) 10 MG tablet Take 10 mg by mouth daily.     . metolazone (ZAROXOLYN) 2.5 MG tablet Take 2.5 mg by mouth daily as needed for edema.    . metoprolol tartrate (LOPRESSOR) 25 MG tablet Take 0.5 tablets (12.5 mg total) by mouth 2 (two) times daily. (Patient taking differently: Take 25 mg by mouth 2 (two) times daily. ) 60 tablet 2  . Multiple Vitamins-Minerals (PRESERVISION AREDS 2+MULTI VIT) CAPS Take 1 capsule by mouth 2 (two) times daily.    . naloxone (NARCAN) nasal spray 4 mg/0.1 mL Place 1 spray into the nose See admin instructions. Administer 1 spray in one nostril. Repeat every 3 minutes if no response.    . nitroGLYCERIN (NITROSTAT) 0.4 MG SL tablet Place 0.4 mg under the tongue every 5 (five) minutes x 3 doses as needed for chest pain.     Marland Kitchen nystatin (NYSTATIN) powder Apply 1 application topically 4 (four) times daily as needed (fungal infection).    . nystatin-triamcinolone (MYCOLOG II) cream Apply 1 application topically 3 (three) times daily as needed (affected area(s)).    .  pantoprazole (PROTONIX) 20 MG tablet Take 20 mg by mouth daily.    . polyethylene glycol (MIRALAX / GLYCOLAX) 17 g packet Take 17 g by mouth daily as needed (constipation.).     Marland Kitchen QUEtiapine (SEROQUEL) 25 MG tablet Take 25 mg by mouth at bedtime.     . rosuvastatin (CRESTOR) 40 MG tablet Take 1 tablet (40 mg total) by mouth daily. (Patient taking differently: Take 40 mg by mouth at bedtime. ) 90 tablet 3  . Sennosides-Docusate Sodium (  SENEXON-S PO) Take 2 tablets by mouth daily as needed (constipation.).     Marland Kitchen sodium chloride (OCEAN) 0.65 % nasal spray Place 1 spray into the nose 4 (four) times daily as needed for congestion (nasal dryness).    . torsemide (DEMADEX) 20 MG tablet Take 20 mg by mouth 2 (two) times daily.    . traMADol (ULTRAM) 50 MG tablet Take 50 mg by mouth 2 (two) times daily as needed for severe pain.    . traZODone (DESYREL) 100 MG tablet Take 100 mg by mouth at bedtime.    . vitamin B-12 1000 MCG tablet Take 1 tablet (1,000 mcg total) by mouth daily. 30 tablet 1  . Zinc Oxide 13 % CREA Apply topically in the morning, at noon, and at bedtime.     No current facility-administered medications for this visit.    Allergies  Allergen Reactions  . Penicillins Nausea And Vomiting, Rash and Hives  . Other Other (See Comments)  . Sulfa Antibiotics     Pt unsure reaction, was told in hospital she had allergy  . Penicillin G Rash      Review of Systems negative except from HPI and PMH  Physical Exam Pulse 75   Ht _0  (1.499 m)   Wt 172 lb 6 oz (78.2 kg)   BMI 34.82 kg/m  Well developed and nourished in no acute distress HENT normal Neck supple  Clear Irregularly irregular rate and rhythm, no murmurs or gallops Abd-soft with active BS No Clubbing cyanosis edema Skin-warm and dry A & Oriented  Grossly normal sensory and motor function  ECG afib with interm V pacing @ 75     Assessment and  Plan  Sinus node dysfunction  Pacemaker-[place]  Atrial  fibrillation-persistent>> permanent   Hypertension   Ischemic heart disease with prior stenting  HFpEF  GI bleeding precluding anticoagulation   Anemia  COPD home O2   Loss of vision left eye  Without symptoms of ischemia-- continue ASA  Chronic anemia continue Fe    For AV ablation with persistent afib and not candidacy for anticoagulation   Reviewed risks and benefits and will anticipate overnight stay

## 2019-12-09 NOTE — Patient Instructions (Signed)
Medication Instructions:  - Your physician recommends that you continue on your current medications as directed. Please refer to the Current Medication list given to you today.  *If you need a refill on your cardiac medications before your next appointment, please call your pharmacy*   Lab Work: Pre procedure lab work today: BMP/ CBC  Pre procedure COVID swab today:  - you will need to drive up to the [place] entrance - staff will come out to your vehicle to swab you   If you have labs (blood work) drawn today and your tests are completely normal, you will receive your results only by: Marland Kitchen MyChart Message (if you have MyChart) OR . A paper copy in the mail If you have any lab test that is abnormal or we need to change your treatment, we will call you to review the results.   Testing/Procedures: - Your physician has recommended that you have an ablation. Catheter ablation is a medical procedure used to treat some cardiac arrhythmias (irregular heartbeats). During catheter ablation, a long, thin, flexible tube is put into a blood vessel in your groin (upper thigh), or neck. This tube is called an ablation catheter. It is then guided to your heart through the blood vessel. Radio frequency waves destroy small areas of heart tissue where abnormal heartbeats may cause an arrhythmia to start. Please see the instruction sheet given to you today.   Follow-Up: At [facility], you and your health needs are our priority.  As part of our continuing mission to provide you with exceptional heart care, we have created designated Provider Care Teams.  These Care Teams include your primary Cardiologist (physician) and Advanced Practice Providers (APPs -  Physician Assistants and Nurse Practitioners) who all work together to provide you with the care you need, when you need it.  We recommend signing up for the patient portal called "MyChart".  Sign up information is provided on this After Visit  Summary.  MyChart is used to connect with patients for Virtual Visits (Telemedicine).  Patients are able to view lab/test results, encounter notes, upcoming appointments, etc.  Non-urgent messages can be sent to your provider as well.   To learn more about what you can do with MyChart, go to [email or web address].    Your next appointment:   4-5  week(s)   The format for your next appointment:   In Person  Provider:   Virl Axe, MD   Other Instructions    Cardiac Ablation Cardiac ablation is a procedure to disable (ablate) a small amount of heart tissue in very specific places. The heart has many electrical connections. Sometimes these connections are abnormal and can cause the heart to beat very fast or irregularly. Ablating some of the problem areas can improve the heart rhythm or return it to normal. Ablation may be done for people who:  Have Wolff-Parkinson-White syndrome.  Have fast heart rhythms (tachycardia).  Have taken medicines for an abnormal heart rhythm (arrhythmia) that were not effective or caused side effects.  Have a high-risk heartbeat that may be life-threatening. During the procedure, a small incision is made in the neck or the groin, and a long, thin, flexible tube (catheter) is inserted into the incision and moved to the heart. Small devices (electrodes) on the tip of the catheter will send out electrical currents. A type of X-ray (fluoroscopy) will be used to help guide the catheter and to provide images of the heart. Tell a health care provider about:  Any allergies  you have.  All medicines you are taking, including vitamins, herbs, eye drops, creams, and over-the-counter medicines.  Any problems you or family members have had with anesthetic medicines.  Any blood disorders you have.  Any surgeries you have had.  Any medical conditions you have, such as kidney failure.  Whether you are pregnant or may be pregnant. What are the risks? Generally,  this is a safe procedure. However, problems may occur, including:  Infection.  Bruising and bleeding at the catheter insertion site.  Bleeding into the chest, especially into the sac that surrounds the heart. This is a serious complication.  Stroke or blood clots.  Damage to other structures or organs.  Allergic reaction to medicines or dyes.  Need for a permanent pacemaker if the normal electrical system is damaged. A pacemaker is a small computer that sends electrical signals to the heart and helps your heart beat normally.  The procedure not being fully effective. This may not be recognized until months later. Repeat ablation procedures are sometimes required. What happens before the procedure?  Follow instructions from your health care provider about eating or drinking restrictions.  Ask your health care provider about: ? Changing or stopping your regular medicines. This is especially important if you are taking diabetes medicines or blood thinners. ? Taking medicines such as aspirin and ibuprofen. These medicines can thin your blood. Do not take these medicines before your procedure if your health care provider instructs you not to.  Plan to have someone take you home from the hospital or clinic.  If you will be going home right after the procedure, plan to have someone with you for 24 hours. What happens during the procedure?  To lower your risk of infection: ? Your health care team will wash or sanitize their hands. ? Your skin will be washed with soap. ? Hair may be removed from the incision area.  An IV tube will be inserted into one of your veins.  You will be given a medicine to help you relax (sedative).  The skin on your neck or groin will be numbed.  An incision will be made in your neck or your groin.  A needle will be inserted through the incision and into a large vein in your neck or groin.  A catheter will be inserted into the needle and moved to your  heart.  Dye may be injected through the catheter to help your surgeon see the area of the heart that needs treatment.  Electrical currents will be sent from the catheter to ablate heart tissue in desired areas. There are three types of energy that may be used to ablate heart tissue: ? Heat (radiofrequency energy). ? Laser energy. ? Extreme cold (cryoablation).  When the necessary tissue has been ablated, the catheter will be removed.  Pressure will be held on the catheter insertion area to prevent excessive bleeding.  A bandage (dressing) will be placed over the catheter insertion area. The procedure may vary among health care providers and hospitals. What happens after the procedure?  Your blood pressure, heart rate, breathing rate, and blood oxygen level will be monitored until the medicines you were given have worn off.  Your catheter insertion area will be monitored for bleeding. You will need to lie still for a few hours to ensure that you do not bleed from the catheter insertion area.  Do not drive for 24 hours or as long as directed by your health care provider. Summary  Cardiac ablation  is a procedure to disable (ablate) a small amount of heart tissue in very specific places. Ablating some of the problem areas can improve the heart rhythm or return it to normal.  During the procedure, electrical currents will be sent from the catheter to ablate heart tissue in desired areas. This information is not intended to replace advice given to you by your health care provider. Make sure you discuss any questions you have with your health care provider. Document Revised: 07/21/2017 Document Reviewed: 12/18/2015 Elsevier Patient Education  [place].

## 2019-12-09 NOTE — H&P (View-Only) (Signed)
Patient Care Team: Dionicia Abler, NP as PCP - General Deboraha Sprang, MD as PCP - Cardiology (Cardiology)   HPI  Evelyn Simmons is a 86 y.o. 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 She treated with iron replacement therapy. Takes amiodarone-- but now with persistent AFib since 7/21 with two interval hospitalizations 2/2 A/C HFpEF  Discussed at last visit AV ablation as felt not to be candidate for anticoagulation 2/2 GI bleeding; chronic anemia with Fe sat at 6%   History of coronary artery disease with stenting in 1995. Last underwent catheterization 12/11-- high-grade stenosis of her RCA >>stented.     Chronic fatigue, O2 so dyspneic No edema or chest pain  Jitters are improved but still some  C/o dry mouth and eyes   Date Cr K TSH LFTs Hgb PFTs  3/16  1.13 4.5 2.75 12     2/17       9.3    7/19 1.1 4.0 1.45 19 10.9   9/21 1.61 3.8   8.4<7.4    DATE TEST EF   12/11 LHC  RCA stented   12/13 TEE 45%   8/17 Echo   >55 % AS mild (mean Grad 13)  8/21 Echo  45-50% LAE-severe  AS mild  (mG-59m)          Past Medical History:  Diagnosis Date  . 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 09/2019 Echo: EF 45-50%, glob HK, mild conc LVH, mildly elev PASP. Sev dil LA. Mildly dil RA. Mild MR.  .Marland KitchenAnemia   . Arthritis   . Asthma   . CAD (coronary artery disease)    a. 1995 s/p PCI/BMS LAD; b. 2011 s/p PCI/DES RCA.  .Marland KitchenCHF (congestive heart failure) (HC[place]on   . Chronic kidney disease   . COPD (chronic obstructive pulmonary disease) ([facility]   . Depression   . Diabetes mellitus without complication ([place]   . Diverticulitis   . Dysrhythmia   . GERD (gastroesophageal reflux disease)   . Hyperlipidemia   . Hypertension   . Hypothyroid    pt unsure,  not currently on medication  . Ischemic cardiomyopathy   . MI (myocardial infarction) ([facility] [place]    1995  . Persistent atrial fibrillation ([facility] [place]    a. No OAC 2/2 h/o bleeding/anemia; b. Longterm amio therapy; c. 08/2019 - recurrent afib-->rate-controlled.  . Presence of permanent cardiac pacemaker   . Radiculopathy     Past Surgical History:  Procedure Laterality Date  . ABDOMINAL HYSTERECTOMY    . APPENDECTOMY    . CARDIAC CATHETERIZATION    . COLONOSCOPY WITH PROPOFOL N/A 10/21/2019   Procedure: COLONOSCOPY WITH PROPOFOL;  Surgeon: VLin Landsman MD;  [facility]  Service: Gastroenterology;  Laterality: N/A;  . CORONARY ANGIOPLASTY WITH STENT PLACEMENT  12/11/1993   RCA  . ESOPHAGOGASTRODUODENOSCOPY (EGD) WITH PROPOFOL N/A 10/21/2019   Procedure: ESOPHAGOGASTRODUODENOSCOPY (EGD) WITH PROPOFOL;  Surgeon: VLin Landsman MD;  [facility]  Service: Gastroenterology;  Laterality: N/A;  . FOOT SURGERY Bilateral    Over 25 years ago- Bunionectomy  . GIVENS CAPSULE STUDY N/A 10/22/2019   Procedure: GIVENS CAPSULE STUDY;  Surgeon: VLin Landsman MD;  [facility];  Service: Gastroenterology;  Laterality: N/A;  . INSERT / REPLACE /  REMOVE PACEMAKER  09/2012   Model # X323  serial # [id number]  . JOINT REPLACEMENT     bilateral knee replacements    Current Outpatient Medications  Medication Sig Dispense Refill  . albuterol (PROVENTIL HFA;VENTOLIN HFA) 108 (90 Base) MCG/ACT inhaler Inhale 2 puffs into the lungs every 6 (six) hours as needed for wheezing or shortness of breath.    Marland Kitchen amiodarone (PACERONE) 200 MG tablet Take 1 tablet (200 mg total) by mouth 2 (two) times daily. 60 tablet 3  . aspirin EC 81 MG tablet Take 81 mg by mouth daily.    . Blood Glucose Monitoring Suppl (ACCU-CHEK NANO SMARTVIEW) W/DEVICE KIT Please issue nano meter by AccuChek along with test strips at checking frequency of 2x daily. 90 day supply. E11.9 1 kit 1  .  carboxymethylcellul-glycerin (OPTIVE) 0.5-0.9 % ophthalmic solution Place 1 drop into both eyes in the morning, at noon, and at bedtime.    . cholecalciferol (VITAMIN D) 1000 units tablet Take 1,000 Units by mouth daily.    . diclofenac Sodium (VOLTAREN) 1 % GEL Apply 1 application topically 4 (four) times daily as needed (left knee pain.).     Marland Kitchen DULoxetine (CYMBALTA) 30 MG capsule Take 30 mg by mouth daily.    . ferrous sulfate 325 (65 FE) MG tablet Take 1 tablet (325 mg total) by mouth 2 (two) times daily with a meal. 60 tablet 1  . furosemide (LASIX) 20 MG tablet Take 20 mg by mouth daily as needed for edema.    Marland Kitchen glucose blood (FREESTYLE LITE) test strip Use as instructed 200 each 3  . Lancets (FREESTYLE) lancets Use as instructed 200 each 3  . lidocaine (LIDODERM) 5 % Place 1 patch onto the skin daily as needed (knee pain.). Remove & Discard patch within 12 hours or as directed by MD    . loratadine (CLARITIN) 10 MG tablet Take 10 mg by mouth daily.     . metolazone (ZAROXOLYN) 2.5 MG tablet Take 2.5 mg by mouth daily as needed for edema.    . metoprolol tartrate (LOPRESSOR) 25 MG tablet Take 0.5 tablets (12.5 mg total) by mouth 2 (two) times daily. (Patient taking differently: Take 25 mg by mouth 2 (two) times daily. ) 60 tablet 2  . Multiple Vitamins-Minerals (PRESERVISION AREDS 2+MULTI VIT) CAPS Take 1 capsule by mouth 2 (two) times daily.    . naloxone (NARCAN) nasal spray 4 mg/0.1 mL Place 1 spray into the nose See admin instructions. Administer 1 spray in one nostril. Repeat every 3 minutes if no response.    . nitroGLYCERIN (NITROSTAT) 0.4 MG SL tablet Place 0.4 mg under the tongue every 5 (five) minutes x 3 doses as needed for chest pain.     Marland Kitchen nystatin (NYSTATIN) powder Apply 1 application topically 4 (four) times daily as needed (fungal infection).    . nystatin-triamcinolone (MYCOLOG II) cream Apply 1 application topically 3 (three) times daily as needed (affected area(s)).    .  pantoprazole (PROTONIX) 20 MG tablet Take 20 mg by mouth daily.    . polyethylene glycol (MIRALAX / GLYCOLAX) 17 g packet Take 17 g by mouth daily as needed (constipation.).     Marland Kitchen QUEtiapine (SEROQUEL) 25 MG tablet Take 25 mg by mouth at bedtime.     . rosuvastatin (CRESTOR) 40 MG tablet Take 1 tablet (40 mg total) by mouth daily. (Patient taking differently: Take 40 mg by mouth at bedtime. ) 90 tablet 3  . Sennosides-Docusate Sodium (  SENEXON-S PO) Take 2 tablets by mouth daily as needed (constipation.).     Marland Kitchen sodium chloride (OCEAN) 0.65 % nasal spray Place 1 spray into the nose 4 (four) times daily as needed for congestion (nasal dryness).    . torsemide (DEMADEX) 20 MG tablet Take 20 mg by mouth 2 (two) times daily.    . traMADol (ULTRAM) 50 MG tablet Take 50 mg by mouth 2 (two) times daily as needed for severe pain.    . traZODone (DESYREL) 100 MG tablet Take 100 mg by mouth at bedtime.    . vitamin B-12 1000 MCG tablet Take 1 tablet (1,000 mcg total) by mouth daily. 30 tablet 1  . Zinc Oxide 13 % CREA Apply topically in the morning, at noon, and at bedtime.     No current facility-administered medications for this visit.    Allergies  Allergen Reactions  . Penicillins Nausea And Vomiting, Rash and Hives  . Other Other (See Comments)  . Sulfa Antibiotics     Pt unsure reaction, was told in hospital she had allergy  . Penicillin G Rash      Review of Systems negative except from HPI and PMH  Physical Exam Pulse 75   Ht _0  (1.499 m)   Wt 172 lb 6 oz (78.2 kg)   BMI 34.82 kg/m  Well developed and nourished in no acute distress HENT normal Neck supple  Clear Irregularly irregular rate and rhythm, no murmurs or gallops Abd-soft with active BS No Clubbing cyanosis edema Skin-warm and dry A & Oriented  Grossly normal sensory and motor function  ECG afib with interm V pacing @ 75     Assessment and  Plan  Sinus node dysfunction  Pacemaker-[place]  Atrial  fibrillation-persistent>> permanent   Hypertension   Ischemic heart disease with prior stenting  HFpEF  GI bleeding precluding anticoagulation   Anemia  COPD home O2   Loss of vision left eye  Without symptoms of ischemia-- continue ASA  Chronic anemia continue Fe    For AV ablation with persistent afib and not candidacy for anticoagulation   Reviewed risks and benefits and will anticipate overnight stay

## 2019-12-09 NOTE — Addendum Note (Signed)
Addended by: Britt Bottom on: 12/09/2019 10:28 AM   Modules accepted: Orders

## 2019-12-10 LAB — SARS CORONAVIRUS 2 (TAT 6-24 HRS): SARS Coronavirus 2: NEGATIVE

## 2019-12-13 ENCOUNTER — Ambulatory Visit (HOSPITAL_COMMUNITY)
Admission: RE | Admit: 2019-12-13 | Discharge: 2019-12-15 | Disposition: A | Discharge status: Home / Self Care | Payer: Medicare (Managed Care) | Attending: Internal Medicine | Admitting: Internal Medicine

## 2019-12-13 ENCOUNTER — Other Ambulatory Visit: Payer: Self-pay

## 2019-12-13 ENCOUNTER — Encounter (HOSPITAL_COMMUNITY): Payer: Self-pay | Admitting: Internal Medicine

## 2019-12-13 ENCOUNTER — Encounter (HOSPITAL_COMMUNITY)
Admission: RE | Disposition: A | Discharge status: Home / Self Care | Payer: Medicare (Managed Care) | Source: Home / Self Care | Attending: Internal Medicine

## 2019-12-13 DIAGNOSIS — D649 Anemia, unspecified: Secondary | ICD-10-CM | POA: Diagnosis not present

## 2019-12-13 DIAGNOSIS — Z950 Presence of cardiac pacemaker: Secondary | ICD-10-CM | POA: Insufficient documentation

## 2019-12-13 DIAGNOSIS — J9611 Chronic respiratory failure with hypoxia: Secondary | ICD-10-CM | POA: Diagnosis not present

## 2019-12-13 DIAGNOSIS — Z880 Allergy status to penicillin: Secondary | ICD-10-CM | POA: Diagnosis not present

## 2019-12-13 DIAGNOSIS — I259 Chronic ischemic heart disease, unspecified: Secondary | ICD-10-CM | POA: Insufficient documentation

## 2019-12-13 DIAGNOSIS — I442 Atrioventricular block, complete: Secondary | ICD-10-CM | POA: Diagnosis present

## 2019-12-13 DIAGNOSIS — I5042 Chronic combined systolic (congestive) and diastolic (congestive) heart failure: Secondary | ICD-10-CM | POA: Insufficient documentation

## 2019-12-13 DIAGNOSIS — J449 Chronic obstructive pulmonary disease, unspecified: Secondary | ICD-10-CM | POA: Insufficient documentation

## 2019-12-13 DIAGNOSIS — I110 Hypertensive heart disease with heart failure: Secondary | ICD-10-CM | POA: Diagnosis not present

## 2019-12-13 DIAGNOSIS — I4891 Unspecified atrial fibrillation: Secondary | ICD-10-CM

## 2019-12-13 DIAGNOSIS — I495 Sick sinus syndrome: Secondary | ICD-10-CM | POA: Diagnosis not present

## 2019-12-13 DIAGNOSIS — Z955 Presence of coronary angioplasty implant and graft: Secondary | ICD-10-CM | POA: Insufficient documentation

## 2019-12-13 DIAGNOSIS — I4819 Other persistent atrial fibrillation: Secondary | ICD-10-CM | POA: Diagnosis not present

## 2019-12-13 DIAGNOSIS — Z79899 Other long term (current) drug therapy: Secondary | ICD-10-CM | POA: Insufficient documentation

## 2019-12-13 DIAGNOSIS — E876 Hypokalemia: Secondary | ICD-10-CM | POA: Insufficient documentation

## 2019-12-13 DIAGNOSIS — Z9981 Dependence on supplemental oxygen: Secondary | ICD-10-CM | POA: Insufficient documentation

## 2019-12-13 DIAGNOSIS — Z882 Allergy status to sulfonamides status: Secondary | ICD-10-CM | POA: Diagnosis not present

## 2019-12-13 DIAGNOSIS — Z7982 Long term (current) use of aspirin: Secondary | ICD-10-CM | POA: Insufficient documentation

## 2019-12-13 HISTORY — PX: AV NODE ABLATION: EP1193

## 2019-12-13 LAB — CBC
HCT: 31.4 % — ABNORMAL LOW (ref 36.0–46.0)
Hemoglobin: 9.3 g/dL — ABNORMAL LOW (ref 12.0–15.0)
MCH: 30.1 pg (ref 26.0–34.0)
MCHC: 29.6 g/dL — ABNORMAL LOW (ref 30.0–36.0)
MCV: 101.6 fL — ABNORMAL HIGH (ref 80.0–100.0)
Platelets: 195 10*3/uL (ref 150–400)
RBC: 3.09 MIL/uL — ABNORMAL LOW (ref 3.87–5.11)
RDW: 18 % — ABNORMAL HIGH (ref 11.5–15.5)
WBC: 4.4 10*3/uL (ref 4.0–10.5)
nRBC: 0 % (ref 0.0–0.2)

## 2019-12-13 LAB — BASIC METABOLIC PANEL
Anion gap: 11 (ref 5–15)
BUN: 27 mg/dL — ABNORMAL HIGH (ref 8–23)
CO2: 34 mmol/L — ABNORMAL HIGH (ref 22–32)
Calcium: 10.1 mg/dL (ref 8.9–10.3)
Chloride: 95 mmol/L — ABNORMAL LOW (ref 98–111)
Creatinine, Ser: 2.22 mg/dL — ABNORMAL HIGH (ref 0.44–1.00)
GFR, Estimated: 21 mL/min — ABNORMAL LOW (ref >60)
Glucose, Bld: 111 mg/dL — ABNORMAL HIGH (ref 70–99)
Potassium: 2.7 mmol/L — CL (ref 3.5–5.1)
Sodium: 140 mmol/L (ref 135–145)

## 2019-12-13 LAB — GLUCOSE, CAPILLARY: Glucose-Capillary: 101 mg/dL — ABNORMAL HIGH (ref 70–99)

## 2019-12-13 SURGERY — AV NODE ABLATION

## 2019-12-13 MED ORDER — HEPARIN (PORCINE) IN NACL 1000-0.9 UT/500ML-% IV SOLN
INTRAVENOUS | Status: DC | PRN
  Administered 2019-12-13: 500 mL

## 2019-12-13 MED ORDER — ONDANSETRON HCL 4 MG/2ML IJ SOLN: 4.0000 mg | Freq: Four times a day (QID) | INTRAMUSCULAR | Status: DC | PRN

## 2019-12-13 MED ORDER — VITAMIN D 25 MCG (1000 UNIT) PO TABS
1000.0000 [IU] | ORAL_TABLET | Freq: Every day | ORAL | Status: DC
  Administered 2019-12-14 – 2019-12-15 (×2): 1000 [IU] via ORAL
  Filled 2019-12-13 (×2): qty 1

## 2019-12-13 MED ORDER — MIDAZOLAM HCL 5 MG/5ML IJ SOLN
INTRAMUSCULAR | Status: AC
  Filled 2019-12-13: qty 5

## 2019-12-13 MED ORDER — SODIUM CHLORIDE 0.9% FLUSH: 3.0000 mL | INTRAVENOUS | Status: DC | PRN

## 2019-12-13 MED ORDER — HEPARIN (PORCINE) IN NACL 1000-0.9 UT/500ML-% IV SOLN
INTRAVENOUS | Status: AC
  Filled 2019-12-13: qty 500

## 2019-12-13 MED ORDER — TRAZODONE HCL 100 MG PO TABS
100.0000 mg | ORAL_TABLET | Freq: Every day | ORAL | Status: DC
  Filled 2019-12-13: qty 1

## 2019-12-13 MED ORDER — SODIUM CHLORIDE 0.9% FLUSH
3.0000 mL | Freq: Two times a day (BID) | INTRAVENOUS | Status: DC
  Administered 2019-12-13 – 2019-12-14 (×3): 3 mL via INTRAVENOUS

## 2019-12-13 MED ORDER — TRAZODONE HCL 50 MG PO TABS
50.0000 mg | ORAL_TABLET | Freq: Every day | ORAL | Status: DC
  Administered 2019-12-13 – 2019-12-14 (×2): 50 mg via ORAL
  Filled 2019-12-13: qty 1

## 2019-12-13 MED ORDER — FERROUS SULFATE 325 (65 FE) MG PO TABS
325.0000 mg | ORAL_TABLET | Freq: Two times a day (BID) | ORAL | Status: DC
  Administered 2019-12-14 – 2019-12-15 (×3): 325 mg via ORAL
  Filled 2019-12-13 (×3): qty 1

## 2019-12-13 MED ORDER — ADULT MULTIVITAMIN W/MINERALS CH
1.0000 | ORAL_TABLET | Freq: Every day | ORAL | Status: DC
  Administered 2019-12-14 – 2019-12-15 (×2): 1 via ORAL
  Filled 2019-12-13 (×2): qty 1

## 2019-12-13 MED ORDER — LORATADINE 10 MG PO TABS
10.0000 mg | ORAL_TABLET | Freq: Every day | ORAL | Status: DC
  Administered 2019-12-14 – 2019-12-15 (×2): 10 mg via ORAL
  Filled 2019-12-13 (×2): qty 1

## 2019-12-13 MED ORDER — ROSUVASTATIN CALCIUM 20 MG PO TABS
40.0000 mg | ORAL_TABLET | Freq: Every day | ORAL | Status: DC
  Administered 2019-12-14: 40 mg via ORAL
  Filled 2019-12-13 (×2): qty 2

## 2019-12-13 MED ORDER — SODIUM CHLORIDE 0.9 % IV SOLN
INTRAVENOUS | Status: DC
  Filled 2019-12-13: qty 1000

## 2019-12-13 MED ORDER — BUPIVACAINE HCL (PF) 0.25 % IJ SOLN
INTRAMUSCULAR | Status: AC
  Filled 2019-12-13: qty 30

## 2019-12-13 MED ORDER — ALBUTEROL SULFATE HFA 108 (90 BASE) MCG/ACT IN AERS
2.0000 | INHALATION_SPRAY | Freq: Four times a day (QID) | RESPIRATORY_TRACT | Status: DC | PRN
  Filled 2019-12-13: qty 6.7

## 2019-12-13 MED ORDER — ACETAMINOPHEN 325 MG PO TABS: 650.0000 mg | ORAL_TABLET | ORAL | Status: DC | PRN

## 2019-12-13 MED ORDER — POTASSIUM CHLORIDE 10 MEQ/100ML IV SOLN
10.0000 meq | INTRAVENOUS | Status: AC
  Administered 2019-12-13 (×2): 10 meq via INTRAVENOUS
  Filled 2019-12-13: qty 100

## 2019-12-13 MED ORDER — MIDAZOLAM HCL 5 MG/5ML IJ SOLN
INTRAMUSCULAR | Status: DC | PRN
  Administered 2019-12-13: 0.5 mg via INTRAVENOUS

## 2019-12-13 MED ORDER — NITROGLYCERIN 0.4 MG SL SUBL: 0.4000 mg | SUBLINGUAL_TABLET | SUBLINGUAL | Status: DC | PRN

## 2019-12-13 MED ORDER — TORSEMIDE 20 MG PO TABS
20.0000 mg | ORAL_TABLET | Freq: Two times a day (BID) | ORAL | Status: DC
  Administered 2019-12-14 (×2): 20 mg via ORAL
  Filled 2019-12-13 (×3): qty 1

## 2019-12-13 MED ORDER — SODIUM CHLORIDE 0.9 % IV SOLN: 250.0000 mL | INTRAVENOUS | Status: DC | PRN

## 2019-12-13 MED ORDER — POTASSIUM CHLORIDE 10 MEQ/100ML IV SOLN
INTRAVENOUS | Status: AC
  Administered 2019-12-13: 10 meq via INTRAVENOUS
  Filled 2019-12-13: qty 200

## 2019-12-13 MED ORDER — ASPIRIN EC 81 MG PO TBEC
81.0000 mg | DELAYED_RELEASE_TABLET | Freq: Every day | ORAL | Status: DC
  Administered 2019-12-14 – 2019-12-15 (×2): 81 mg via ORAL
  Filled 2019-12-13 (×2): qty 1

## 2019-12-13 MED ORDER — DULOXETINE HCL 30 MG PO CPEP
30.0000 mg | ORAL_CAPSULE | Freq: Every day | ORAL | Status: DC
  Administered 2019-12-14 – 2019-12-15 (×2): 30 mg via ORAL
  Filled 2019-12-13 (×2): qty 1

## 2019-12-13 MED ORDER — QUETIAPINE FUMARATE 25 MG PO TABS
25.0000 mg | ORAL_TABLET | Freq: Every day | ORAL | Status: DC
  Administered 2019-12-14: 25 mg via ORAL
  Filled 2019-12-13 (×2): qty 1

## 2019-12-13 MED ORDER — VITAMIN B-12 1000 MCG PO TABS
1000.0000 ug | ORAL_TABLET | Freq: Every day | ORAL | Status: DC
  Administered 2019-12-14 – 2019-12-15 (×2): 1000 ug via ORAL
  Filled 2019-12-13 (×2): qty 1

## 2019-12-13 MED ORDER — POLYETHYLENE GLYCOL 3350 17 G PO PACK: 17.0000 g | PACK | Freq: Every day | ORAL | Status: DC | PRN

## 2019-12-13 MED ORDER — BUPIVACAINE HCL (PF) 0.25 % IJ SOLN
INTRAMUSCULAR | Status: DC | PRN
  Administered 2019-12-13: 30 mL

## 2019-12-13 MED ORDER — TRAMADOL HCL 50 MG PO TABS: 50.0000 mg | ORAL_TABLET | Freq: Two times a day (BID) | ORAL | Status: DC | PRN

## 2019-12-13 MED ORDER — PANTOPRAZOLE SODIUM 20 MG PO TBEC
20.0000 mg | DELAYED_RELEASE_TABLET | Freq: Every day | ORAL | Status: DC
  Administered 2019-12-14 – 2019-12-15 (×2): 20 mg via ORAL
  Filled 2019-12-13 (×2): qty 1

## 2019-12-13 MED ORDER — NALOXONE HCL 4 MG/0.1ML NA LIQD: 1.0000 | NASAL | Status: DC

## 2019-12-13 SURGICAL SUPPLY — 10 items
BAG SNAP BAND KOVER 36X36 (MISCELLANEOUS) ×3 IMPLANT
CATH BLAZER 7FR 4MM LG 5031TK2 (ABLATOR) ×3 IMPLANT
COVER DOME SNAP 22 D (MISCELLANEOUS) ×3 IMPLANT
INTRODUCER SWARTZ SL2 8F (SHEATH) ×3 IMPLANT
MAT PREVALON FULL STRYKER (MISCELLANEOUS) ×3 IMPLANT
PACK EP LATEX FREE (CUSTOM PROCEDURE TRAY) ×2
PACK EP LF (CUSTOM PROCEDURE TRAY) ×1 IMPLANT
PAD PRO RADIOLUCENT 2001M-C (PAD) ×3 IMPLANT
SHEATH PINNACLE 8F 10CM (SHEATH) ×3 IMPLANT
SHIELD RADPAD SCOOP 12X17 (MISCELLANEOUS) ×3 IMPLANT

## 2019-12-13 NOTE — Interval H&P Note (Signed)
History and Physical Interval Note:  12/13/2019 10:09 AM  Evelyn Simmons  has presented today for surgery, with the diagnosis of Atrial fibrillation.  The various methods of treatment have been discussed with the patient and family. After consideration of risks, benefits and other options for treatment, the patient has consented to  Procedure(s): AV NODE ABLATION (N/A) as a surgical intervention.  The patient's history has been reviewed, patient examined, no change in status, stable for surgery.  I have reviewed the patient's chart and labs.  Questions were answered to the patient's satisfaction.     Virl Axe  [facility] nodal ablation

## 2019-12-13 NOTE — Progress Notes (Signed)
Due to low potassium, 2.7, 3 runs ordered. Pt placed on cardiac monitor.

## 2019-12-13 NOTE — Interval H&P Note (Signed)
History and Physical Interval Note:  12/13/2019 10:01 AM  Evelyn Simmons  has presented today for surgery, with the diagnosis of Atrial fibrillation.  The various methods of treatment have been discussed with the patient and family. After consideration of risks, benefits and other options for treatment, the patient has consented to  Procedure(s): AV NODE ABLATION (N/A) as a surgical intervention.  The patient's history has been reviewed, patient examined, no change in status, stable for surgery.  I have reviewed the patient's chart and labs.  Questions were answered to the patient's satisfaction.     Virl Axe

## 2019-12-13 NOTE — Progress Notes (Addendum)
Patient arrived to [facility] in NAD, VS stable and patient free from pain. Patient oriented to room and call bell in reach, dinner ordered. Patient's home wheelchair in room along with bag of clothing. No phone, jewelry. purse or glasses. Upper and lower dentures with patient.

## 2019-12-14 DIAGNOSIS — I5042 Chronic combined systolic (congestive) and diastolic (congestive) heart failure: Secondary | ICD-10-CM | POA: Diagnosis not present

## 2019-12-14 DIAGNOSIS — I442 Atrioventricular block, complete: Secondary | ICD-10-CM

## 2019-12-14 DIAGNOSIS — I110 Hypertensive heart disease with heart failure: Secondary | ICD-10-CM | POA: Diagnosis not present

## 2019-12-14 DIAGNOSIS — I4819 Other persistent atrial fibrillation: Secondary | ICD-10-CM | POA: Diagnosis not present

## 2019-12-14 LAB — BASIC METABOLIC PANEL
Anion gap: 13 (ref 5–15)
Anion gap: 9 (ref 5–15)
BUN: 25 mg/dL — ABNORMAL HIGH (ref 8–23)
BUN: 27 mg/dL — ABNORMAL HIGH (ref 8–23)
CO2: 32 mmol/L (ref 22–32)
CO2: 34 mmol/L — ABNORMAL HIGH (ref 22–32)
Calcium: 9.8 mg/dL (ref 8.9–10.3)
Calcium: 9.8 mg/dL (ref 8.9–10.3)
Chloride: 95 mmol/L — ABNORMAL LOW (ref 98–111)
Chloride: 95 mmol/L — ABNORMAL LOW (ref 98–111)
Creatinine, Ser: 2.05 mg/dL — ABNORMAL HIGH (ref 0.44–1.00)
Creatinine, Ser: 2.13 mg/dL — ABNORMAL HIGH (ref 0.44–1.00)
GFR, Estimated: 23 mL/min — ABNORMAL LOW (ref >60)
GFR, Estimated: 22 mL/min — ABNORMAL LOW (ref >60)
Glucose, Bld: 82 mg/dL (ref 70–99)
Glucose, Bld: 229 mg/dL — ABNORMAL HIGH (ref 70–99)
Potassium: 2.8 mmol/L — ABNORMAL LOW (ref 3.5–5.1)
Potassium: 4.2 mmol/L (ref 3.5–5.1)
Sodium: 140 mmol/L (ref 135–145)
Sodium: 138 mmol/L (ref 135–145)

## 2019-12-14 LAB — MAGNESIUM: Magnesium: 1.3 mg/dL — ABNORMAL LOW (ref 1.7–2.4)

## 2019-12-14 MED ORDER — POTASSIUM CHLORIDE CRYS ER 20 MEQ PO TBCR
60.0000 meq | EXTENDED_RELEASE_TABLET | Freq: Once | ORAL | Status: AC
  Administered 2019-12-14: 60 meq via ORAL
  Filled 2019-12-14: qty 3

## 2019-12-14 MED ORDER — MAGNESIUM SULFATE 4 GM/100ML IV SOLN
4.0000 g | Freq: Once | INTRAVENOUS | Status: AC
  Administered 2019-12-14: 4 g via INTRAVENOUS
  Filled 2019-12-14: qty 100

## 2019-12-14 MED ORDER — POTASSIUM CHLORIDE CRYS ER 20 MEQ PO TBCR
40.0000 meq | EXTENDED_RELEASE_TABLET | Freq: Once | ORAL | Status: AC
  Administered 2019-12-14: 40 meq via ORAL
  Filled 2019-12-14: qty 2

## 2019-12-14 MED ORDER — POTASSIUM CHLORIDE CRYS ER 20 MEQ PO TBCR: 40.0000 meq | EXTENDED_RELEASE_TABLET | Freq: Once | ORAL | Status: DC

## 2019-12-14 NOTE — Progress Notes (Addendum)
Electrophysiology Rounding Note  Patient Name: Evelyn Simmons Date of Encounter: 12/14/2019  Primary Cardiologist: Virl Axe, MD Electrophysiologist: Dr. Caryl Comes  Subjective   The patient is doing well today.  At this time, the patient denies chest pain, shortness of breath, or any new concerns.  Groin stable s/p AV nodal ablation  Inpatient Medications    Scheduled Meds: . aspirin EC  81 mg Oral Daily  . cholecalciferol  1,000 Units Oral Daily  . DULoxetine  30 mg Oral Daily  . ferrous sulfate  325 mg Oral BID WC  . loratadine  10 mg Oral Daily  . multivitamin with minerals  1 tablet Oral Daily  . naloxone  1 spray Nasal See admin instructions  . pantoprazole  20 mg Oral Daily  . QUEtiapine  25 mg Oral QHS  . rosuvastatin  40 mg Oral QHS  . sodium chloride flush  3 mL Intravenous Q12H  . torsemide  20 mg Oral BID  . traZODone  50 mg Oral QHS  . cyanocobalamin  1,000 mcg Oral Daily   Continuous Infusions: . sodium chloride    . magnesium sulfate bolus IVPB     PRN Meds: sodium chloride, acetaminophen, albuterol, nitroGLYCERIN, ondansetron (ZOFRAN) IV, polyethylene glycol, sodium chloride flush, traMADol   Vital Signs    Vitals:   12/14/19 0315 12/14/19 0320 12/14/19 0325 12/14/19 0720  BP: 90/60   (!) 92/48  Pulse: 90 90 90   Resp: 19 20 20 20   Temp: 97.8 F (36.6 C)   98.4 F (36.9 C)  TempSrc: Oral   Oral  SpO2: 96% 96% 93%   Weight: 77 kg     Height:        Intake/Output Summary (Last 24 hours) at 12/14/2019 0946 Last data filed at 12/14/2019 0914 Gross per 24 hour  Intake 603 ml  Output 850 ml  Net -247 ml   Filed Weights   12/13/19 0947 12/14/19 0315  Weight: 77.1 kg 77 kg    Physical Exam    GEN- The patient is well appearing, alert and oriented x 3 today.   Head- normocephalic, atraumatic Eyes-  Sclera clear, conjunctiva pink Ears- hearing intact Oropharynx- clear Neck- supple Lungs- Clear to ausculation bilaterally, normal work of  breathing Heart- Regular rate and rhythm, no murmurs, rubs or gallops GI- soft, NT, ND, + BS Extremities- no clubbing or cyanosis. No edema Skin- no rash or lesion Psych- euthymic mood, full affect Neuro- strength and sensation are intact  Labs    CBC Recent Labs    12/13/19 1025  WBC 4.4  HGB 9.3*  HCT 31.4*  MCV 101.6*  PLT 017   Basic Metabolic Panel Recent Labs    12/13/19 1025 12/14/19 0743  NA 140 140  K 2.7* 2.8*  CL 95* 95*  CO2 34* 32  GLUCOSE 111* 82  BUN 27* 25*  CREATININE 2.22* 2.05*  CALCIUM 10.1 9.8  MG  --  1.3*   Liver Function Tests No results for input(s): AST, ALT, ALKPHOS, BILITOT, PROT, ALBUMIN in the last 72 hours. No results for input(s): LIPASE, AMYLASE in the last 72 hours. Cardiac Enzymes No results for input(s): CKTOTAL, CKMB, CKMBINDEX, TROPONINI in the last 72 hours.   Telemetry    V paced at 90 (personally reviewed)  Radiology    No results found.  Patient Profile     Drisana Schweickert is a 86 y.o. female with a past medical history significant for permanent atrial fibrillation, HTN, HFpEF,  GI bleeding precluding OAC, and anemia.  she was admitted for planned procedure / AV nodal ablation.   Assessment & Plan    1. Permanent AF s/p AV nodal ablation V pacing at 90 today. Will plan on turning down to 80 with rate response in 2 weeks, and then down to 70 with rate response in 4 weeks.  Groin stable.   2. Hypokalemia Noted on admission K 2.8 today. Will aggressively supp and recheck this afternoon. If > 3.2-3.3 and able to get labs in next day or two, will be OK for discharge. If unable to supp appropriately, will need to continue to treat for hypokalemia.  Mg 1.3. 4 g IV ordered.  Discussed above with Dr. Caryl Comes.   For questions or updates, please contact [place] Please consult [email or web address] for contact info under Cardiology/STEMI.  Signed, Shirley Friar, PA-C  12/14/2019, 9:46 AM   Atrial fibrillation  with rapid rate  AV junction ablation  Pacemaker-[employer]  Hypokalemia/hypomagnesemia  Renal insufficiency Estimated Creatinine Clearance: 17 mL/min (A) (by C-G formula based on SCr of 2.05 mg/dL (H)).  Grade 4   Patient's rate is controlled status post AV ablation.  We will plan to discharge at a rate of 90 with reprogramming at wound check.  We will need repletion of potassium and magnesium prior to discharge.  Early follow-up with pace

## 2019-12-14 NOTE — Progress Notes (Addendum)
  Discussed with pt, MD, and PACE.   Delay in getting labwork due to difficult stick, will delay results and discharge.   We would be unlikely to be able to arrange transportation or medications for this patient by the time she would get out, as PACE is only available until around 1700.  With steep drop off on K compared to recent labwork, will keep at least one additional day to continue supplementation and land on a dosage for chronic K supplementation.   Will discuss transportation, med changes with [facility] in am.   ADDENDUM K 4.2 after aggressive supp. With AKI as above plan is to keep tonight and check BMET in am for final dosing.   Legrand Como [address] [place], PA-C  12/14/2019 3:16 PM

## 2019-12-14 NOTE — Progress Notes (Signed)
   Stable this am post AV node ablation  K remains low at 2.8.   Will supp, and will need daily supp added with close follow up labwork.   Will discuss disposition with MD.  Full note pending.  Legrand Como [address]" [place], PA-C  12/14/2019 8:54 AM

## 2019-12-15 DIAGNOSIS — I442 Atrioventricular block, complete: Secondary | ICD-10-CM | POA: Diagnosis not present

## 2019-12-15 LAB — BASIC METABOLIC PANEL
Anion gap: 7 (ref 5–15)
BUN: 26 mg/dL — ABNORMAL HIGH (ref 8–23)
CO2: 34 mmol/L — ABNORMAL HIGH (ref 22–32)
Calcium: 9.9 mg/dL (ref 8.9–10.3)
Chloride: 96 mmol/L — ABNORMAL LOW (ref 98–111)
Creatinine, Ser: 2.17 mg/dL — ABNORMAL HIGH (ref 0.44–1.00)
GFR, Estimated: 21 mL/min — ABNORMAL LOW (ref >60)
Glucose, Bld: 124 mg/dL — ABNORMAL HIGH (ref 70–99)
Potassium: 3.8 mmol/L (ref 3.5–5.1)
Sodium: 137 mmol/L (ref 135–145)

## 2019-12-15 LAB — MAGNESIUM: Magnesium: 2.3 mg/dL (ref 1.7–2.4)

## 2019-12-15 MED ORDER — ACETAMINOPHEN 325 MG PO TABS: 650.0000 mg | ORAL_TABLET | ORAL | Status: DC | PRN

## 2019-12-15 MED ORDER — POTASSIUM CHLORIDE 20 MEQ/15ML (10%) PO SOLN: 20.0000 meq | Freq: Every day | ORAL | 0 refills | Status: DC

## 2019-12-15 NOTE — Discharge Summary (Addendum)
ELECTROPHYSIOLOGY PROCEDURE DISCHARGE SUMMARY    Patient ID: Evelyn Simmons,  MRN: [id number], DOB/AGE: 1929/11/10 86 y.o.  Admit date: 12/13/2019 Discharge date: 12/15/2019  Primary Care Physician: Evelyn Abler, NP  Primary Cardiologist: Evelyn Axe, MD  Electrophysiologist: Dr. Caryl Simmons  Primary Discharge Diagnosis:  Permanent Atrial Fibrillation  Secondary Discharge Diagnosis:  Hypokalemia Sinus node dysfunction HTN ICM with prior stenting HFpEF H/o GI bleeding precluding anticoagulation Chronic hypoxic respiratory failure  Procedures This Admission:  1.  Electrophysiology study and radiofrequency catheter ablation of AV node on 12/13/2019 by Dr. Caryl Simmons.  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  Programmed at 90 bpm LRL until follow up.    Brief HPI: Evelyn Simmons is a 86 y.o. female with a history of permanent  Atrial Fibrillation and not candidate for Dexter given chronic anemia. Risks, benefits, and alternatives to catheter ablation of AV node were reviewed with the patient who wished to proceed.      Hospital Course:  The patient was admitted and underwent EPS/RFCA of AV node with details as outlined above. Marked hypokalemia at 2.7 on admission was supped.  They were monitored on telemetry overnight which demonstrated V pacing 90s. Groin was without complication on the day of discharge. Potassium remained low at 2.8 and was thus supped aggressively throughout the day with correction. By that time, it was too late to discharge due to transportation and ability to get medications. This am K is 3.8. The patient was examined by Dr. Curt Simmons and considered to be stable for discharge.  Wound care and restrictions were reviewed with the patient.  The patient Evelyn Simmons be seen back by Tommye Standard, PA in 2 weeks and Dr. Caryl Simmons in 4-6 weeks for post ablation follow up.   This plan was reviewed personally with her [facility] Evelyn Simmons Provider at [phone]. [name] be  started on 20 meq of potassium solution daily with repeat labwork 12/17/2019 faxed to our office.  Can repeat again next week as needed.   I also reviewed her wound care and restrictions s/p ablation with her provider.   CHA2DS2VASC of at least 7, but as above, not candidate due to chronic anemia and h/o GI bleeding.    Above score calculated as 1 point each if present [CHF, HTN, DM, Vascular=MI/PAD/Aortic Plaque, Age if 65-74, or Female] Above score calculated as 2 points each if present [Age > 75, or Stroke/TIA/TE]   Physical Exam: Vitals:   12/14/19 1109 12/14/19 1133 12/14/19 1943 12/15/19 0334  BP: (!) 100/40  (!) 101/48 (!) 93/53  Pulse:   90 90  Resp:   20 16  Temp: 98.3 F (36.8 C) 98.2 F (36.8 C) 98.6 F (37 C) (!) 97.5 F (36.4 C)  TempSrc: Oral Oral Oral Oral  SpO2:   98% 92%  Weight:    75.1 kg  Height:        GEN- The patient is well appearing, alert and oriented x 3 today.   HEENT: normocephalic, atraumatic; sclera clear, conjunctiva pink; hearing intact; oropharynx clear; neck supple  Lungs- Clear to ausculation bilaterally, normal work of breathing.  No wheezes, rales, rhonchi Heart- Regular rate and rhythm, no murmurs, rubs or gallops  GI- soft, non-tender, non-distended, bowel sounds present  Extremities- no clubbing, cyanosis, or edema; DP/PT/radial pulses 2+ bilaterally, groin without hematoma/bruit MS- no significant deformity or atrophy Skin- warm and dry, no rash or lesion Psych- euthymic mood, full affect Neuro- strength and sensation are intact  Labs:   Lab Results  Component Value Date   WBC 4.4 12/13/2019   HGB 9.3 (L) 12/13/2019   HCT 31.4 (L) 12/13/2019   MCV 101.6 (H) 12/13/2019   PLT 195 12/13/2019    Recent Labs  Lab 12/15/19 0220  NA 137  K 3.8  CL 96*  CO2 34*  BUN 26*  CREATININE 2.17*  CALCIUM 9.9  GLUCOSE 124*     Discharge Medications:  Allergies as of 12/15/2019      Reactions   Penicillins Nausea And Vomiting,  Rash, Hives   Other Other (See Comments)   Sulfa Antibiotics    Pt unsure reaction, was told in hospital she had allergy   Penicillin G Rash      Medication List    STOP taking these medications   amiodarone 200 MG tablet Commonly known as: PACERONE   furosemide 20 MG tablet Commonly known as: LASIX   metoprolol tartrate 25 MG tablet Commonly known as: LOPRESSOR     TAKE these medications   Accu-Chek Nano SmartView w/Device Kit Please issue nano meter by AccuChek along with test strips at checking frequency of 2x daily. 90 day supply. E11.9   acetaminophen 325 MG tablet Commonly known as: TYLENOL Take 2 tablets (650 mg total) by mouth every 4 (four) hours as needed for headache or mild pain.   albuterol 108 (90 Base) MCG/ACT inhaler Commonly known as: VENTOLIN HFA Inhale 2 puffs into the lungs every 6 (six) hours as needed for wheezing or shortness of breath.   aspirin EC 81 MG tablet Take 81 mg by mouth daily.   cholecalciferol 1000 units tablet Commonly known as: VITAMIN D Take 1,000 Units by mouth daily.   cyanocobalamin 1000 MCG tablet Take 1 tablet (1,000 mcg total) by mouth daily.   diclofenac Sodium 1 % Gel Commonly known as: VOLTAREN Apply 1 application topically 4 (four) times daily as needed (left knee pain.).   DULoxetine 30 MG capsule Commonly known as: CYMBALTA Take 30 mg by mouth daily.   ferrous sulfate 325 (65 FE) MG tablet Take 1 tablet (325 mg total) by mouth 2 (two) times daily with a meal.   freestyle lancets Use as instructed   glucose blood test strip Commonly known as: FREESTYLE LITE Use as instructed   lidocaine 5 % Commonly known as: LIDODERM Place 1 patch onto the skin daily as needed (knee pain.). Remove & Discard patch within 12 hours or as directed by MD   loratadine 10 MG tablet Commonly known as: CLARITIN Take 10 mg by mouth daily.   metolazone 2.5 MG tablet Commonly known as: ZAROXOLYN Take 2.5 mg by mouth daily as  needed for edema.   naloxone 4 MG/0.1ML Liqd nasal spray kit Commonly known as: NARCAN Place 1 spray into the nose See admin instructions. Administer 1 spray in one nostril. Repeat every 3 minutes if no response.   nitroGLYCERIN 0.4 MG SL tablet Commonly known as: NITROSTAT Place 0.4 mg under the tongue every 5 (five) minutes x 3 doses as needed for chest pain.   nystatin powder Generic drug: nystatin Apply 1 application topically 4 (four) times daily as needed (fungal infection).   nystatin-triamcinolone cream Commonly known as: MYCOLOG II Apply 1 application topically 3 (three) times daily as needed (affected area(s)).   OPTIVE 0.5-0.9 % ophthalmic solution Generic drug: carboxymethylcellul-glycerin Place 1 drop into both eyes in the morning, at noon, and at bedtime.   pantoprazole 20 MG tablet Commonly known as: PROTONIX  Take 20 mg by mouth daily.   polyethylene glycol 17 g packet Commonly known as: MIRALAX / GLYCOLAX Take 17 g by mouth daily as needed (constipation.).   potassium chloride 20 MEQ/15ML (10%) Soln Take 15 mLs (20 mEq total) by mouth daily. Start taking on: December 16, 2019   PreserVision AREDS 2+Multi Vit Caps Take 1 capsule by mouth 2 (two) times daily.   QUEtiapine 25 MG tablet Commonly known as: SEROQUEL Take 25 mg by mouth at bedtime.   rosuvastatin 40 MG tablet Commonly known as: CRESTOR Take 1 tablet (40 mg total) by mouth daily. What changed: when to take this   SENEXON-S PO Take 2 tablets by mouth daily as needed (constipation.).   sodium chloride 0.65 % nasal spray Commonly known as: OCEAN Place 1 spray into the nose 4 (four) times daily as needed for congestion (nasal dryness).   torsemide 20 MG tablet Commonly known as: DEMADEX Take 20 mg by mouth 2 (two) times daily.   traMADol 50 MG tablet Commonly known as: ULTRAM Take 50 mg by mouth 2 (two) times daily as needed for severe pain.   traZODone 100 MG tablet Commonly known as:  DESYREL Take 100 mg by mouth at bedtime.   Zinc Oxide 13 % Crea Apply topically in the morning, at noon, and at bedtime.       Disposition:    Follow-up Information    Deboraha Sprang, MD Follow up on 01/18/2020.   Specialty: Cardiology Why: at [place] for post ablation follow up Contact information: [address] [place] [address] [phone]        Baldwin Jamaica, PA-C Follow up on 12/29/2019.   Specialty: Cardiology Why: at [facility] for post hospital follow up. Contact information: [address] [place] [address] [phone]               Duration of Discharge Encounter: Greater than 30 minutes including physician time.  Signed, Shirley Friar, PA-C  12/15/2019 9:10 AM  I have seen and examined this patient with Oda Kilts.  Agree with above, note added to reflect my findings.  On exam, RRR. Patient admitted for AVN ablation. Plan discharge today.    Bee Marchiano M. Seara Hinesley MD 12/15/2019 10:59 AM

## 2019-12-15 NOTE — Addendum Note (Signed)
Addended by: Janan Ridge on: 12/15/2019 03:47 PM   Modules accepted: Orders

## 2019-12-15 NOTE — Discharge Instructions (Signed)

## 2019-12-28 NOTE — Progress Notes (Deleted)
Cardiology Office Note Date:  12/28/2019  Patient ID:  Evelyn Simmons, Evelyn Simmons Sep 30, 1929, MRN [id number] PCP:  Dionicia Abler, NP  Cardiologist:  Dr. Rockey Situ Electrophysiologist: Dr. Caryl Comes  ***refresh   Chief Complaint: *** post AV node ablation  History of Present Illness: Evelyn Simmons is a 86 y.o. female with history of CAD ({CI LAD 1995, RCA 2011), SSSx w/PPM, permanent Afib (no longer on a/c and pt declined with h/o bleeding, anemia), ICM, DM, HTN, HLD, COPD w/chronic hypoxic respiratory failure on home O2, GERD, blind L eye.  She has had recent trouble with SOB requiring hospital stays felt to be multifactorial 2/2 her CM/volume OL, anemia, obesity, deconditioning At her hospitalization in Sept she had EGD/small bowel capsule study with with region of gastric erythema and no indication for intervention received iron infusion  At her last OV with Dr. Caryl Comes 12/07/19 felt her AFib was now permanent and planned for AV node ablation. She underwent the procedure 12/13/2019 She was hypokalemic and stayed an extra day for replacement Discharged 12/15/19 on K+ replacement to be followed with [facility] for labs to be forwarded to [place] Planned for 2 week visit to reduce pacing rate/programming to VVIR 80 and then 70 2 weeks after that   *** I dont see f/u labs, K+ *** VVIR 80  *** symptoms *** groin *** volume *** meds, CAD, CM *** could use cards f/u w/[facility]   Device information BSCi dual chamber PPM implanted 09/23/2012   Past Medical History:  Diagnosis Date  . (HFmrEF) heart failure with mid-range ejection fraction ([place])    a. 09/2019 Echo: EF 45-50%, glob HK, mild conc LVH, mildly elev PASP. Sev dil LA. Mildly dil RA. Mild MR.  Marland Kitchen Anemia   . Arthritis   . Asthma   . CAD (coronary artery disease)    a. 1995 s/p PCI/BMS LAD; b. 2011 s/p PCI/DES RCA.  Marland Kitchen CHF (congestive heart failure) ([place])   . Chronic kidney disease   . COPD (chronic obstructive pulmonary disease) ([place])    . Depression   . Diabetes mellitus without complication ([place])   . Diverticulitis   . Dysrhythmia   . GERD (gastroesophageal reflux disease)   . Hyperlipidemia   . Hypertension   . Hypothyroid    pt unsure, not currently on medication  . Ischemic cardiomyopathy   . MI (myocardial infarction) ([place])    1995  . Persistent atrial fibrillation ([place])    a. No OAC 2/2 h/o bleeding/anemia; b. Longterm amio therapy; c. 08/2019 - recurrent afib-->rate-controlled.  . Presence of permanent cardiac pacemaker   . Radiculopathy     Past Surgical History:  Procedure Laterality Date  . ABDOMINAL HYSTERECTOMY    . APPENDECTOMY    . AV NODE ABLATION N/A 12/13/2019   Procedure: AV NODE ABLATION;  Surgeon: Deboraha Sprang, MD;  Location: [facility] [facility];  Service: Cardiovascular;  Laterality: N/A;  . CARDIAC CATHETERIZATION    . COLONOSCOPY WITH PROPOFOL N/A 10/21/2019   Procedure: COLONOSCOPY WITH PROPOFOL;  Surgeon: Lin Landsman, MD;  Location: [facility] ENDOSCOPY;  Service: Gastroenterology;  Laterality: N/A;  . CORONARY ANGIOPLASTY WITH STENT PLACEMENT  12/11/1993   RCA  . ESOPHAGOGASTRODUODENOSCOPY (EGD) WITH PROPOFOL N/A 10/21/2019   Procedure: ESOPHAGOGASTRODUODENOSCOPY (EGD) WITH PROPOFOL;  Surgeon: Lin Landsman, MD;  Location: [place];  Service: Gastroenterology;  Laterality: N/A;  . FOOT SURGERY Bilateral    Over 25 years ago- Bunionectomy  . GIVENS CAPSULE STUDY N/A 10/22/2019  Procedure: GIVENS CAPSULE STUDY;  Surgeon: Lin Landsman, MD;  Location[facility] ENDOSCOPY;  Service: Gastroenterology;  Laterality: N/A;  . INSERT / REPLACE / REMOVE PACEMAKER  09/2012   Model # [id number]  serial # [id number]  . JOINT REPLACEMENT     bilateral knee replacements    Current Outpatient Medications  Medication Sig Dispense Refill  . acetaminophen (TYLENOL) 325 MG tablet Take 2 tablets (650 mg total) by mouth every 4 (four) hours as needed for headache or mild pain.    Marland Kitchen albuterol  (PROVENTIL HFA;VENTOLIN HFA) 108 (90 Base) MCG/ACT inhaler Inhale 2 puffs into the lungs every 6 (six) hours as needed for wheezing or shortness of breath.    Marland Kitchen aspirin EC 81 MG tablet Take 81 mg by mouth daily.    . Blood Glucose Monitoring Suppl (ACCU-CHEK NANO SMARTVIEW) W/DEVICE KIT Please issue nano meter by AccuChek along with test strips at checking frequency of 2x daily. 90 day supply. E11.9 1 kit 1  . carboxymethylcellul-glycerin (OPTIVE) 0.5-0.9 % ophthalmic solution Place 1 drop into both eyes in the morning, at noon, and at bedtime.    . cholecalciferol (VITAMIN D) 1000 units tablet Take 1,000 Units by mouth daily.    . diclofenac Sodium (VOLTAREN) 1 % GEL Apply 1 application topically 4 (four) times daily as needed (left knee pain.).     Marland Kitchen DULoxetine (CYMBALTA) 30 MG capsule Take 30 mg by mouth daily.    . ferrous sulfate 325 (65 FE) MG tablet Take 1 tablet (325 mg total) by mouth 2 (two) times daily with a meal. 60 tablet 1  . glucose blood (FREESTYLE LITE) test strip Use as instructed 200 each 3  . Lancets (FREESTYLE) lancets Use as instructed 200 each 3  . lidocaine (LIDODERM) 5 % Place 1 patch onto the skin daily as needed (knee pain.). Remove & Discard patch within 12 hours or as directed by MD    . loratadine (CLARITIN) 10 MG tablet Take 10 mg by mouth daily.     . metolazone (ZAROXOLYN) 2.5 MG tablet Take 2.5 mg by mouth daily as needed for edema.    . Multiple Vitamins-Minerals (PRESERVISION AREDS 2+MULTI VIT) CAPS Take 1 capsule by mouth 2 (two) times daily.    . naloxone (NARCAN) nasal spray 4 mg/0.1 mL Place 1 spray into the nose See admin instructions. Administer 1 spray in one nostril. Repeat every 3 minutes if no response.    . nitroGLYCERIN (NITROSTAT) 0.4 MG SL tablet Place 0.4 mg under the tongue every 5 (five) minutes x 3 doses as needed for chest pain.     Marland Kitchen nystatin (NYSTATIN) powder Apply 1 application topically 4 (four) times daily as needed (fungal infection).     . nystatin-triamcinolone (MYCOLOG II) cream Apply 1 application topically 3 (three) times daily as needed (affected area(s)).    . pantoprazole (PROTONIX) 20 MG tablet Take 20 mg by mouth daily.    . polyethylene glycol (MIRALAX / GLYCOLAX) 17 g packet Take 17 g by mouth daily as needed (constipation.).     Marland Kitchen potassium chloride 20 MEQ/15ML (10%) SOLN Take 15 mLs (20 mEq total) by mouth daily. 473 mL 0  . QUEtiapine (SEROQUEL) 25 MG tablet Take 25 mg by mouth at bedtime.     . rosuvastatin (CRESTOR) 40 MG tablet Take 1 tablet (40 mg total) by mouth daily. (Patient taking differently: Take 40 mg by mouth at bedtime. ) 90 tablet 3  . Sennosides-Docusate Sodium (SENEXON-S PO) Take  2 tablets by mouth daily as needed (constipation.).     Marland Kitchen sodium chloride (OCEAN) 0.65 % nasal spray Place 1 spray into the nose 4 (four) times daily as needed for congestion (nasal dryness).    . torsemide (DEMADEX) 20 MG tablet Take 20 mg by mouth 2 (two) times daily.    . traMADol (ULTRAM) 50 MG tablet Take 50 mg by mouth 2 (two) times daily as needed for severe pain.    . traZODone (DESYREL) 100 MG tablet Take 100 mg by mouth at bedtime.    . vitamin B-12 1000 MCG tablet Take 1 tablet (1,000 mcg total) by mouth daily. 30 tablet 1  . Zinc Oxide 13 % CREA Apply topically in the morning, at noon, and at bedtime.     No current facility-administered medications for this visit.    Allergies:   Penicillins, Other, Sulfa antibiotics, and Penicillin g   Social History:  The patient  reports that she quit smoking about 26 years ago. She has quit using smokeless tobacco. She reports current alcohol use. She reports that she does not use drugs.   Family History:  The patient's family history includes Arthritis in her father and mother; Cancer in her daughter and sister; Diabetes in her brother, mother, and sister; Heart disease in her father and mother; Hypertension in her father and mother.  ROS:  Please see the history of  present illness.    All other systems are reviewed and otherwise negative.   PHYSICAL EXAM:  VS:  There were no vitals taken for this visit. BMI: There is no height or weight on file to calculate BMI. Well nourished, well developed, in no acute distress HEENT: normocephalic, atraumatic Neck: no JVD, carotid bruits or masses Cardiac:  *** RRR; no significant murmurs, no rubs, or gallops Lungs:  *** CTA b/l, no wheezing, rhonchi or rales Abd: soft, nontender MS: no deformity or *** atrophy Ext: *** no edema, *** R groin Skin: warm and dry, no rash Neuro:  No gross deficits appreciated Psych: euthymic mood, full affect  *** PPM site is stable, no tethering or discomfort   EKG:  Not done today  Device interrogation done today and reviewed by myself:  ***   09/2019: TTE 1. Left ventricular ejection fraction, by estimation, is 45 to 50%. The  left ventricle has mildly decreased function. The left ventricle  demonstrates global hypokinesis. There is mild concentric left ventricular  hypertrophy. Left ventricular diastolic  parameters are indeterminate. Elevated left ventricular end-diastolic  pressure.  2. Right ventricular systolic function is mildly reduced. The right  ventricular size is normal. There is mildly elevated pulmonary artery  systolic pressure.  3. Left atrial size was severely dilated.  4. Right atrial size was mildly dilated.  5. The mitral valve is normal in structure. Mild mitral valve  regurgitation. No evidence of mitral stenosis.  6. The aortic valve is tricuspid. Aortic valve regurgitation is not  visualized. No aortic stenosis is present.  7. The inferior vena cava is normal in size with <50% respiratory  variability, suggesting right atrial pressure of 8 mmHg.   Recent Labs: 10/04/2019: TSH 3.163 10/18/2019: ALT 19; B Natriuretic Peptide 377.8 12/13/2019: Hemoglobin 9.3; Platelets 195 12/15/2019: BUN 26; Creatinine, Ser 2.17; Magnesium 2.3;  Potassium 3.8; Sodium 137  09/18/2019: Cholesterol 122; HDL 34; LDL Cholesterol 70; Total CHOL/HDL Ratio 3.6; Triglycerides 89; VLDL 18   Estimated Creatinine Clearance: 15.8 mL/min (A) (by C-G formula based on SCr of 2.17 mg/dL (  H)).   Wt Readings from Last 3 Encounters:  12/15/19 165 lb 9.1 oz (75.1 kg)  12/09/19 172 lb 6 oz (78.2 kg)  11/02/19 180 lb (81.6 kg)     Other studies reviewed: Additional studies/records reviewed today include: summarized above  ASSESSMENT AND PLAN:  1. PPM     *** programmed as noted above  2. Permanent Afib     S/p AV node ablation Nov 2021     Not on a/c 2/2 GIB requiring transfusion    3. CAD     ***  4. Mild ICM     ***  5. HTN     ***  6. Chronic hypoxic respiratory failure 6. COPD     Home O2     ***  Disposition: F/u with ***  Current medicines are reviewed at length with the patient today.  The patient did not have any concerns regarding medicines.  Venetia Night, PA-C 12/28/2019 10:36 AM     [facility] [place] [place]  [phone] (office)  [phone] (fax)

## 2019-12-29 ENCOUNTER — Encounter: Payer: Medicare (Managed Care) | Admitting: Physician Assistant

## 2019-12-29 ENCOUNTER — Ambulatory Visit: Payer: Medicare (Managed Care) | Admitting: Family

## 2019-12-30 ENCOUNTER — Ambulatory Visit: Payer: Medicare (Managed Care) | Admitting: Family

## 2019-12-30 NOTE — Progress Notes (Signed)
2 week post AVN ablation appointment No groin complications Base rate decreased to 70 today, rate response turned on.  Follow up in 4 weeks to further lower rate with device clinic  Chanetta Marshall, NP 12/30/2019 9:43 AM

## 2019-12-31 ENCOUNTER — Ambulatory Visit (INDEPENDENT_AMBULATORY_CARE_PROVIDER_SITE_OTHER): Payer: Medicare (Managed Care) | Admitting: Nurse Practitioner

## 2019-12-31 ENCOUNTER — Other Ambulatory Visit: Payer: Self-pay

## 2019-12-31 ENCOUNTER — Encounter: Payer: Self-pay | Admitting: Internal Medicine

## 2019-12-31 ENCOUNTER — Encounter: Payer: Self-pay | Admitting: Nurse Practitioner

## 2019-12-31 VITALS — BP 124/62 | HR 90 | Ht 59.0 in | Wt 175.4 lb

## 2019-12-31 DIAGNOSIS — I4819 Other persistent atrial fibrillation: Secondary | ICD-10-CM

## 2019-12-31 LAB — PACEMAKER DEVICE OBSERVATION

## 2019-12-31 NOTE — Patient Instructions (Addendum)
Medication Instructions:  *If you need a refill on your cardiac medications before your next appointment, please call your pharmacy*  Follow-Up: At [facility], you and your health needs are our priority.  As part of our continuing mission to provide you with exceptional heart care, we have created designated Provider Care Teams.  These Care Teams include your primary Cardiologist (physician) and Advanced Practice Providers (APPs -  Physician Assistants and Nurse Practitioners) who all work together to provide you with the care you need, when you need it.  We recommend signing up for the patient portal called "MyChart".  Sign up information is provided on this After Visit Summary.  MyChart is used to connect with patients for Virtual Visits (Telemedicine).  Patients are able to view lab/test results, encounter notes, upcoming appointments, etc.  Non-urgent messages can be sent to your provider as well.   To learn more about what you can do with MyChart, go to [email or web address].    Your next appointment:   Your physician recommends that you keep your scheduled follow-up appointment on 01/18/20 with Dr.Klein in [place]  The format for your next appointment:   In Person with Virl Axe, MD
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
## 2020-01-17 ENCOUNTER — Ambulatory Visit: Payer: Medicare (Managed Care) | Admitting: Family

## 2020-01-17 ENCOUNTER — Telehealth: Payer: Self-pay | Admitting: Family

## 2020-01-17 NOTE — Telephone Encounter (Signed)
Patient did not show for her Heart Failure Clinic appointment on 01/17/20. Will attempt to reschedule.

## 2020-01-18 ENCOUNTER — Other Ambulatory Visit: Payer: Self-pay

## 2020-01-18 ENCOUNTER — Encounter: Payer: Self-pay | Admitting: Internal Medicine

## 2020-01-18 ENCOUNTER — Ambulatory Visit (INDEPENDENT_AMBULATORY_CARE_PROVIDER_SITE_OTHER): Payer: Medicare (Managed Care) | Admitting: Internal Medicine

## 2020-01-18 VITALS — BP 110/72 | HR 86 | Ht 59.0 in | Wt 170.4 lb

## 2020-01-18 DIAGNOSIS — I4819 Other persistent atrial fibrillation: Secondary | ICD-10-CM

## 2020-01-18 DIAGNOSIS — I495 Sick sinus syndrome: Secondary | ICD-10-CM | POA: Diagnosis not present

## 2020-01-18 DIAGNOSIS — I5033 Acute on chronic diastolic (congestive) heart failure: Secondary | ICD-10-CM

## 2020-01-18 DIAGNOSIS — Z950 Presence of cardiac pacemaker: Secondary | ICD-10-CM

## 2020-01-18 DIAGNOSIS — I442 Atrioventricular block, complete: Secondary | ICD-10-CM

## 2020-01-18 NOTE — Progress Notes (Signed)
Patient Care Team: Dionicia Abler, NP as PCP - General Deboraha Sprang, MD as PCP - Cardiology (Cardiology)   HPI  Evelyn Simmons is a 86 y.o. female seen in followup for  pacemaker implanted 2014  for sick sinus syndrome. Corporate investment banker).  Developed persistent atrial fibrillation prompting 2 hospitalizations for acute on chronic heart failure related to rapid rates.  With history of recurrent transfusion requiring GI bleeding, not felt to be a candidate for anticoagulation.  Underwent AV junction ablation 11/21    Coronary artery disease with stenting in 1995. catheterization 12/11-- high-grade stenosis of her RCA >>stented.     Chronic O2    Much improved following AV junction ablation;   less dyspnea.  No edema  Date Cr K TSH LFTs Hgb  3/16  1.13 4.5 2.75 12   2/17       9.3  7/19 1.1 4.0 1.45 19 10.9  9/21 1.61 3.8   8.4<7.4  11/21 2.17 3.8<<2.8             DATE TEST EF   12/11 LHC  RCA stented   12/13 TEE 45%   8/17 Echo   >55 % AS mild (mean Grad 13)  8/21 Echo  45-50% LAE-severe  AS mild  (mG-59m)          Past Medical History:  Diagnosis Date  . 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 09/2019 Echo: EF 45-50%, glob HK, mild conc LVH, mildly elev PASP. Sev dil LA. Mildly dil RA. Mild MR.  .Marland KitchenAnemia   . Arthritis   . Asthma   . CAD (coronary artery disease)    a. 1995 s/p PCI/BMS LAD; b. 2011 s/p PCI/DES RCA.  .Marland KitchenCHF (congestive heart failure) ([facility]   . Chronic kidney disease   . COPD (chronic obstructive pulmonary disease) ([facility] [facility]   . Depression   . Diabetes mellitus without complication ([facility]   . Diverticulitis   . Dysrhythmia   . GERD (gastroesophageal reflux disease)   . Hyperlipidemia   . Hypertension   . Hypothyroid    pt unsure, not currently on medication  . Ischemic cardiomyopathy   . MI (myocardial infarction) ([facility]    1995  . Persistent atrial fibrillation ([facility]    a. No OAC 2/2 h/o bleeding/anemia; b.  Longterm amio therapy; c. 08/2019 - recurrent afib-->rate-controlled.  . Presence of permanent cardiac pacemaker   . Radiculopathy     Past Surgical History:  Procedure Laterality Date  . ABDOMINAL HYSTERECTOMY    . APPENDECTOMY    . AV NODE ABLATION N/A 12/13/2019   Procedure: AV NODE ABLATION;  Surgeon: KDeboraha Sprang MD;  [facility];  Service: Cardiovascular;  Laterality: N/A;  . CARDIAC CATHETERIZATION    . COLONOSCOPY WITH PROPOFOL N/A 10/21/2019   Procedure: COLONOSCOPY WITH PROPOFOL;  Surgeon: VLin Landsman MD;  [facility];  Service: Gastroenterology;  Laterality: N/A;  . CORONARY ANGIOPLASTY WITH STENT PLACEMENT  12/11/1993   RCA  . ESOPHAGOGASTRODUODENOSCOPY (EGD) WITH PROPOFOL N/A 10/21/2019   Procedure: ESOPHAGOGASTRODUODENOSCOPY (EGD) WITH PROPOFOL;  Surgeon: VLin Landsman MD;  [facility] [facility]  Service: Gastroenterology;  Laterality: N/A;  . FOOT SURGERY Bilateral    Over 25 years ago- Bunionectomy  . GIVENS CAPSULE STUDY N/A 10/22/2019   Procedure: GIVENS CAPSULE STUDY;  Surgeon: VLin Landsman MD;  [facility];  Service: Gastroenterology;  Laterality: N/A;  . INSERT /  REPLACE / REMOVE PACEMAKER  09/2012   Model # U542  serial # [id number]  . JOINT REPLACEMENT     bilateral knee replacements    Current Outpatient Medications  Medication Sig Dispense Refill  . acetaminophen (TYLENOL) 325 MG tablet Take 2 tablets (650 mg total) by mouth every 4 (four) hours as needed for headache or mild pain.    Marland Kitchen albuterol (PROVENTIL HFA;VENTOLIN HFA) 108 (90 Base) MCG/ACT inhaler Inhale 2 puffs into the lungs every 6 (six) hours as needed for wheezing or shortness of breath.    Marland Kitchen aspirin EC 81 MG tablet Take 81 mg by mouth daily.    . Blood Glucose Monitoring Suppl (ACCU-CHEK NANO SMARTVIEW) W/DEVICE KIT Please issue nano meter by AccuChek along with test strips at checking frequency of 2x daily. 90 day supply. E11.9 1 kit  1  . carboxymethylcellul-glycerin (OPTIVE) 0.5-0.9 % ophthalmic solution Place 1 drop into both eyes in the morning, at noon, and at bedtime.    . cholecalciferol (VITAMIN D) 1000 units tablet Take 1,000 Units by mouth daily.    . diclofenac Sodium (VOLTAREN) 1 % GEL Apply 1 application topically 4 (four) times daily as needed (left knee pain.).     Marland Kitchen DULoxetine (CYMBALTA) 30 MG capsule Take 30 mg by mouth daily.    . ferrous sulfate 325 (65 FE) MG tablet Take 1 tablet (325 mg total) by mouth 2 (two) times daily with a meal. 60 tablet 1  . glucose blood (FREESTYLE LITE) test strip Use as instructed 200 each 3  . Lancets (FREESTYLE) lancets Use as instructed 200 each 3  . lidocaine (LIDODERM) 5 % Place 1 patch onto the skin daily as needed (knee pain.). Remove & Discard patch within 12 hours or as directed by MD    . loratadine (CLARITIN) 10 MG tablet Take 10 mg by mouth daily.     . metolazone (ZAROXOLYN) 2.5 MG tablet Take 2.5 mg by mouth daily as needed for edema.    . Multiple Vitamins-Minerals (PRESERVISION AREDS 2+MULTI VIT) CAPS Take 1 capsule by mouth 2 (two) times daily.    . naloxone (NARCAN) nasal spray 4 mg/0.1 mL Place 1 spray into the nose See admin instructions. Administer 1 spray in one nostril. Repeat every 3 minutes if no response.    . nitroGLYCERIN (NITROSTAT) 0.4 MG SL tablet Place 0.4 mg under the tongue every 5 (five) minutes x 3 doses as needed for chest pain.     Marland Kitchen nystatin (NYSTATIN) powder Apply 1 application topically 4 (four) times daily as needed (fungal infection).    . nystatin-triamcinolone (MYCOLOG II) cream Apply 1 application topically 3 (three) times daily as needed (affected area(s)).    . pantoprazole (PROTONIX) 20 MG tablet Take 20 mg by mouth daily.    . polyethylene glycol (MIRALAX / GLYCOLAX) 17 g packet Take 17 g by mouth daily as needed (constipation.).     Marland Kitchen potassium chloride 20 MEQ/15ML (10%) SOLN Take 15 mLs (20 mEq total) by mouth daily. 473 mL 0  .  QUEtiapine (SEROQUEL) 25 MG tablet Take 25 mg by mouth at bedtime.     . rosuvastatin (CRESTOR) 40 MG tablet Take 1 tablet (40 mg total) by mouth daily. (Patient taking differently: Take 40 mg by mouth at bedtime. ) 90 tablet 3  . Sennosides-Docusate Sodium (SENEXON-S PO) Take 2 tablets by mouth daily as needed (constipation.).     Marland Kitchen sodium chloride (OCEAN) 0.65 % nasal spray Place 1 spray into  the nose 4 (four) times daily as needed for congestion (nasal dryness).    . torsemide (DEMADEX) 20 MG tablet Take 20 mg by mouth 2 (two) times daily.    . traMADol (ULTRAM) 50 MG tablet Take 50 mg by mouth 2 (two) times daily as needed for severe pain.    . traZODone (DESYREL) 100 MG tablet Take 100 mg by mouth at bedtime.    . vitamin B-12 1000 MCG tablet Take 1 tablet (1,000 mcg total) by mouth daily. 30 tablet 1  . Zinc Oxide 13 % CREA Apply topically in the morning, at noon, and at bedtime.     No current facility-administered medications for this visit.    Allergies  Allergen Reactions  . Penicillins Nausea And Vomiting, Rash and Hives  . Other Other (See Comments)  . Sulfa Antibiotics     Pt unsure reaction, was told in hospital she had allergy  . Penicillin G Rash      Review of Systems negative except from HPI and PMH  Physical Exam BP 110/72 (BP Location: Right Arm, Patient Position: Sitting, Cuff Size: Normal)   Pulse 86   Ht 4' 11" (1.499 m)   Wt 170 lb 6.4 oz (77.3 kg)   SpO2 91% Comment: 2 Liters of Oxygen  BMI 34.42 kg/m  Well developed and well nourished in no acute distress HENT normal Neck supple   Clear Device pocket well healed; without hematoma or erythema.  There is no tethering  Regular rate and rhythm, no  murmur Abd-soft with active BS No Clubbing cyanosis  edema Skin-warm and dry A & Oriented  Grossly normal sensory and motor function  ECG atrial fibrillation with ventricular pacing at 86 Intervals-/17/43     Assessment and  Plan  AV  ablation  Pacemaker-[place]  Atrial fibrillation-persistent>> permanent   Hypertension   Ischemic heart disease with prior stenting  HFpEF  GI bleeding precluding anticoagulation   Anemia  Renal insufficiency acute/chronic   Functional status much improved following AV junction ablation.  We will reprogram the device from 80--70.  Currently no obvious fluid overload  Not on anticoagulation secondary to GI bleeding.  Continue aspirin given history of stenting.  Anemia and on iron replacement therapy.  Needs CBC  No ischemia symptoms.  Continue statins  With acute renal injury noted at the time of AV junction ablation, will recheck metabolic profile today.

## 2020-01-18 NOTE — Patient Instructions (Signed)

## 2020-01-19 LAB — BASIC METABOLIC PANEL
BUN/Creatinine Ratio: 14 (ref 12–28)
BUN: 21 mg/dL (ref 8–27)
CO2: 30 mmol/L — ABNORMAL HIGH (ref 20–29)
Calcium: 10.7 mg/dL — ABNORMAL HIGH (ref 8.7–10.3)
Chloride: 100 mmol/L (ref 96–106)
Creatinine, Ser: 1.47 mg/dL — ABNORMAL HIGH (ref 0.57–1.00)
GFR calc Af Amer: 36 mL/min/{1.73_m2} — ABNORMAL LOW (ref >59)
GFR calc non Af Amer: 32 mL/min/{1.73_m2} — ABNORMAL LOW (ref >59)
Glucose: 123 mg/dL — ABNORMAL HIGH (ref 65–99)
Potassium: 3.6 mmol/L (ref 3.5–5.2)
Sodium: 145 mmol/L — ABNORMAL HIGH (ref 134–144)

## 2020-01-20 LAB — CUP PACEART INCLINIC DEVICE CHECK
Brady Statistic RA Percent Paced: 2 %
Brady Statistic RV Percent Paced: 100 %
Date Time Interrogation Session: 20211207000000
Implantable Lead Implant Date: 20140813
Implantable Lead Implant Date: 20140813
Implantable Lead Location: 753859
Implantable Lead Location: 753860
Implantable Lead Model: 4469
Implantable Lead Model: 4470
Implantable Lead Serial Number: 581162
Implantable Lead Serial Number: 755425
Implantable Pulse Generator Implant Date: 20140813
Lead Channel Impedance Value: 348 Ohm
Lead Channel Impedance Value: 425 Ohm
Lead Channel Pacing Threshold Amplitude: 0.5 V
Lead Channel Pacing Threshold Amplitude: 0.8 V
Lead Channel Pacing Threshold Pulse Width: 0.4 ms
Lead Channel Pacing Threshold Pulse Width: 0.5 ms
Lead Channel Sensing Intrinsic Amplitude: 1.3 mV
Lead Channel Sensing Intrinsic Amplitude: 9.5 mV
Lead Channel Setting Pacing Amplitude: 2.5 V
Lead Channel Setting Pacing Pulse Width: 0.4 ms
Lead Channel Setting Sensing Sensitivity: 3 mV
Pulse Gen Serial Number: 161924

## 2020-02-14 ENCOUNTER — Ambulatory Visit: Payer: Medicare (Managed Care) | Admitting: Family

## 2020-02-15 ENCOUNTER — Ambulatory Visit: Payer: Medicare (Managed Care) | Admitting: Family

## 2020-02-15 ENCOUNTER — Telehealth: Payer: Self-pay | Admitting: Family

## 2020-02-15 NOTE — Telephone Encounter (Signed)
Patient did not show for her Heart Failure [facility] appointment on 02/15/20. Will attempt to reschedule.

## 2020-03-10 ENCOUNTER — Other Ambulatory Visit: Payer: Self-pay

## 2020-03-10 ENCOUNTER — Encounter: Payer: Self-pay | Admitting: Family

## 2020-03-10 ENCOUNTER — Ambulatory Visit: Payer: Medicare (Managed Care) | Attending: Family | Admitting: Family

## 2020-03-10 VITALS — BP 157/62 | HR 73 | Resp 18 | Ht 59.0 in | Wt 173.0 lb

## 2020-03-10 DIAGNOSIS — I2510 Atherosclerotic heart disease of native coronary artery without angina pectoris: Secondary | ICD-10-CM | POA: Insufficient documentation

## 2020-03-10 DIAGNOSIS — I5022 Chronic systolic (congestive) heart failure: Secondary | ICD-10-CM | POA: Diagnosis not present

## 2020-03-10 DIAGNOSIS — Z7982 Long term (current) use of aspirin: Secondary | ICD-10-CM | POA: Insufficient documentation

## 2020-03-10 DIAGNOSIS — I491 Atrial premature depolarization: Secondary | ICD-10-CM | POA: Diagnosis not present

## 2020-03-10 DIAGNOSIS — E039 Hypothyroidism, unspecified: Secondary | ICD-10-CM | POA: Insufficient documentation

## 2020-03-10 DIAGNOSIS — Z955 Presence of coronary angioplasty implant and graft: Secondary | ICD-10-CM | POA: Diagnosis not present

## 2020-03-10 DIAGNOSIS — Z79899 Other long term (current) drug therapy: Secondary | ICD-10-CM | POA: Insufficient documentation

## 2020-03-10 DIAGNOSIS — J449 Chronic obstructive pulmonary disease, unspecified: Secondary | ICD-10-CM | POA: Diagnosis not present

## 2020-03-10 DIAGNOSIS — Z87891 Personal history of nicotine dependence: Secondary | ICD-10-CM | POA: Insufficient documentation

## 2020-03-10 DIAGNOSIS — I130 Hypertensive heart and chronic kidney disease with heart failure and stage 1 through stage 4 chronic kidney disease, or unspecified chronic kidney disease: Secondary | ICD-10-CM | POA: Diagnosis present

## 2020-03-10 DIAGNOSIS — Z880 Allergy status to penicillin: Secondary | ICD-10-CM | POA: Insufficient documentation

## 2020-03-10 DIAGNOSIS — Z950 Presence of cardiac pacemaker: Secondary | ICD-10-CM | POA: Diagnosis not present

## 2020-03-10 DIAGNOSIS — Z882 Allergy status to sulfonamides status: Secondary | ICD-10-CM | POA: Insufficient documentation

## 2020-03-10 DIAGNOSIS — E785 Hyperlipidemia, unspecified: Secondary | ICD-10-CM | POA: Diagnosis not present

## 2020-03-10 DIAGNOSIS — K5900 Constipation, unspecified: Secondary | ICD-10-CM | POA: Insufficient documentation

## 2020-03-10 DIAGNOSIS — I252 Old myocardial infarction: Secondary | ICD-10-CM | POA: Diagnosis not present

## 2020-03-10 DIAGNOSIS — Z8249 Family history of ischemic heart disease and other diseases of the circulatory system: Secondary | ICD-10-CM | POA: Diagnosis not present

## 2020-03-10 DIAGNOSIS — I4819 Other persistent atrial fibrillation: Secondary | ICD-10-CM

## 2020-03-10 DIAGNOSIS — N189 Chronic kidney disease, unspecified: Secondary | ICD-10-CM | POA: Insufficient documentation

## 2020-03-10 DIAGNOSIS — N1832 Chronic kidney disease, stage 3b: Secondary | ICD-10-CM

## 2020-03-10 DIAGNOSIS — K219 Gastro-esophageal reflux disease without esophagitis: Secondary | ICD-10-CM | POA: Insufficient documentation

## 2020-03-10 DIAGNOSIS — I255 Ischemic cardiomyopathy: Secondary | ICD-10-CM | POA: Insufficient documentation

## 2020-03-10 DIAGNOSIS — Z96653 Presence of artificial knee joint, bilateral: Secondary | ICD-10-CM | POA: Diagnosis not present

## 2020-03-10 DIAGNOSIS — E1122 Type 2 diabetes mellitus with diabetic chronic kidney disease: Secondary | ICD-10-CM | POA: Insufficient documentation

## 2020-03-10 DIAGNOSIS — I10 Essential (primary) hypertension: Secondary | ICD-10-CM

## 2020-03-10 NOTE — Progress Notes (Signed)
 Patient ID: Evelyn Simmons, female    DOB: 03/17/1929, 87 y.o.   MRN: [id number]  HPI  Ms Kamel is a 87 y/o female with a history of asthma, CAD, DM, hyperlipidemia, HTN, thyroid disease, depression, COPD, GERD, MI, atrial fibrillation, previous tobacco use and chronic heart failure.   Echo report from 09/18/19 reviewed and showed an EF of 45-50% along with mild LVH, mildly elevated PA pressure, severe LAE and mild MR.  AV node ablation done 12/13/19. Admitted 10/18/19 due to worsening shortness of breath. Cardiology and GI consults obtained. Initially placed on IV lasix with transition to oral diuretics. Discharged after 5 days. Admitted 09/17/19 due to acute on chronic HF along with AF/ RVR. Oral medications adjusted. Initially given IV lasix with transition to oral diuretics. Cardiology consult obtained. Discharged after 2 days.    She presents today for a follow-up visit with a chief complaint of minimal shortness of breath. She describes this as chronic in nature having been present for several years. She has associated pedal edema, abdominal distention and constipation along with this. She denies any difficulty sleeping, dizziness, palpitations, chest pain, cough, fatigue or weight gain.   Says that she weighs daily but that her weight doesn't line up with the scales at [facility]. She says that she doesn't feel like her fluid pill works as well as it used to.   She doesn't know what medications she takes and a med list was not provided.   Past Medical History:  Diagnosis Date  . 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 09/2019 Echo: EF 45-50%, glob HK, mild conc LVH, mildly elev PASP. Sev dil LA. Mildly dil RA. Mild MR.  . Anemia   . Arthritis   . Asthma   . CAD (coronary artery disease)    a. 1995 s/p PCI/BMS LAD; b. 2011 s/p PCI/DES RCA.  . CHF (congestive heart failure) (HCC)   . Chronic kidney disease   . COPD (chronic obstructive pulmonary disease) (HCC)   .  Depression   . Diabetes mellitus without complication (HCC)   . Diverticulitis   . Dysrhythmia   . GERD (gastroesophageal reflux disease)   . Hyperlipidemia   . Hypertension   . Hypothyroid    pt unsure, not currently on medication  . Ischemic cardiomyopathy   . MI (myocardial infarction) (HCC)    1995  . Persistent atrial fibrillation (HCC)    a. No OAC 2/2 h/o bleeding/anemia; b. Longterm amio therapy; c. 08/2019 - recurrent afib-->rate-controlled.  . Presence of permanent cardiac pacemaker   . Radiculopathy    Past Surgical History:  Procedure Laterality Date  . ABDOMINAL HYSTERECTOMY    . APPENDECTOMY    . AV NODE ABLATION N/A 12/13/2019   Procedure: AV NODE ABLATION;  Surgeon: Klein, Steven C, MD;  [facility];  Service: Cardiovascular;  Laterality: N/A;  . CARDIAC CATHETERIZATION    . COLONOSCOPY WITH PROPOFOL N/A 10/21/2019   Procedure: COLONOSCOPY WITH PROPOFOL;  Surgeon: Vanga, Rohini Reddy, MD;  Location: [facility];  Service: Gastroenterology;  Laterality: N/A;  . CORONARY ANGIOPLASTY WITH STENT PLACEMENT  12/11/1993   RCA  . ESOPHAGOGASTRODUODENOSCOPY (EGD) WITH PROPOFOL N/A 10/21/2019   Procedure: ESOPHAGOGASTRODUODENOSCOPY (EGD) WITH PROPOFOL;  Surgeon: Vanga, Rohini Reddy, MD;  Location: [facility];  Service: Gastroenterology;  Laterality: N/A;  . FOOT SURGERY Bilateral    Over 25 years ago- Bunionectomy  . GIVENS CAPSULE STUDY N/A 10/22/2019   Procedure: GIVENS CAPSULE   STUDY;  Surgeon: Lin Landsman, MD;  Location: [facility];  Service: Gastroenterology;  Laterality: N/A;  . INSERT / REPLACE / REMOVE PACEMAKER  09/2012   Model # [id number]  serial # [id number]  . JOINT REPLACEMENT     bilateral knee replacements   Family History  Problem Relation Age of Onset  . Arthritis Mother   . Heart disease Mother   . Hypertension Mother   . Diabetes Mother   . Arthritis Father   . Heart disease Father   . Hypertension Father   . Cancer Sister         breast cancer  . Diabetes Brother   . Cancer Daughter        lung cancer  . Diabetes Sister    Social History   Tobacco Use  . Smoking status: Former Smoker    Quit date: 12/10/1993    Years since quitting: 26.2  . Smokeless tobacco: Former Network engineer Use Topics  . Alcohol use: Yes    Alcohol/week: 0.0 standard drinks    Comment: Rare   Allergies  Allergen Reactions  . Penicillins Nausea And Vomiting, Rash and Hives  . Other Other (See Comments)  . Sulfa Antibiotics     Pt unsure reaction, was told in hospital she had allergy  . Penicillin G Rash   Prior to Admission medications   Medication Sig Start Date End Date Taking? Authorizing Provider  acetaminophen (TYLENOL) 325 MG tablet Take 2 tablets (650 mg total) by mouth every 4 (four) hours as needed for headache or mild pain. 12/15/19  Yes Shirley Friar, PA-C  aspirin EC 81 MG tablet Take 81 mg by mouth daily.   Yes [provider]  Blood Glucose Monitoring Suppl (ACCU-CHEK NANO SMARTVIEW) W/DEVICE KIT Please issue nano meter by AccuChek along with test strips at checking frequency of 2x daily. 90 day supply. E11.9 05/13/14  Yes Phadke, Radhika P, MD  carboxymethylcellul-glycerin (OPTIVE) 0.5-0.9 % ophthalmic solution Place 1 drop into both eyes in the morning, at noon, and at bedtime.   Yes [provider]  cholecalciferol (VITAMIN D) 1000 units tablet Take 1,000 Units by mouth daily.   Yes [provider]  diclofenac Sodium (VOLTAREN) 1 % GEL Apply 1 application topically 4 (four) times daily as needed (left knee pain.).    Yes [provider]  DULoxetine (CYMBALTA) 30 MG capsule Take 30 mg by mouth daily.   Yes [provider]  ferrous sulfate 325 (65 FE) MG tablet Take 1 tablet (325 mg total) by mouth 2 (two) times daily with a meal. 10/11/19  Yes Nicole Kindred A, DO  glucose blood (FREESTYLE LITE) test strip Use as instructed 05/30/14  Yes Doss, Velora Heckler, RN   Lancets (FREESTYLE) lancets Use as instructed 05/30/14  Yes Doss, Velora Heckler, RN  lidocaine (LIDODERM) 5 % Place 1 patch onto the skin daily as needed (knee pain.). Remove & Discard patch within 12 hours or as directed by MD   Yes [provider]  loratadine (CLARITIN) 10 MG tablet Take 10 mg by mouth daily.   Yes [provider]  metolazone (ZAROXOLYN) 2.5 MG tablet Take 2.5 mg by mouth daily as needed for edema. 11/18/19  Yes [provider]  Multiple Vitamins-Minerals (PRESERVISION AREDS 2+MULTI VIT) CAPS Take 1 capsule by mouth 2 (two) times daily.   Yes [provider]  naloxone (NARCAN) nasal spray 4 mg/0.1 mL Place 1 spray into the nose See admin instructions.  Administer 1 spray in one nostril. Repeat every 3 minutes if no response.   Yes [provider]  nitroGLYCERIN (NITROSTAT) 0.4 MG SL tablet Place 0.4 mg under the tongue every 5 (five) minutes x 3 doses as needed for chest pain.    Yes [provider]  nystatin (MYCOSTATIN/NYSTOP) powder Apply 1 application topically 4 (four) times daily as needed (fungal infection).   Yes [provider]  nystatin-triamcinolone (MYCOLOG II) cream Apply 1 application topically 3 (three) times daily as needed (affected area(s)).   Yes [provider]  pantoprazole (PROTONIX) 20 MG tablet Take 20 mg by mouth daily.   Yes [provider]  polyethylene glycol (MIRALAX / GLYCOLAX) 17 g packet Take 17 g by mouth daily as needed (constipation.).    Yes [provider]  potassium chloride 20 MEQ/15ML (10%) SOLN Take 15 mLs (20 mEq total) by mouth daily. 12/16/19  Yes Tillery, Michael Andrew, PA-C  QUEtiapine (SEROQUEL) 25 MG tablet Take 25 mg by mouth at bedtime.    Yes [provider]  rosuvastatin (CRESTOR) 40 MG tablet Take 1 tablet (40 mg total) by mouth daily. Patient taking differently: Take 40 mg by mouth at bedtime. 05/09/14  Yes Gollan, Timothy J, MD   Sennosides-Docusate Sodium (SENEXON-S PO) Take 2 tablets by mouth daily as needed (constipation.).    Yes [provider]  sodium chloride (OCEAN) 0.65 % nasal spray Place 1 spray into the nose 4 (four) times daily as needed for congestion (nasal dryness).   Yes [provider]  torsemide (DEMADEX) 20 MG tablet Take 20 mg by mouth 2 (two) times daily.   Yes [provider]  traMADol (ULTRAM) 50 MG tablet Take 50 mg by mouth 2 (two) times daily as needed for severe pain.   Yes [provider]  traZODone (DESYREL) 100 MG tablet Take 100 mg by mouth at bedtime.   Yes [provider]  vitamin B-12 1000 MCG tablet Take 1 tablet (1,000 mcg total) by mouth daily. 10/11/19  Yes Griffith, Kelly A, DO  Zinc Oxide 13 % CREA Apply topically in the morning, at noon, and at bedtime.   Yes [provider]    Review of Systems  Constitutional: Negative for appetite change and fatigue.  HENT: Positive for rhinorrhea. Negative for congestion and sore throat.   Eyes: Positive for visual disturbance (left eye blindness).  Respiratory: Positive for shortness of breath (with moderate exertion). Negative for cough.   Cardiovascular: Positive for leg swelling. Negative for chest pain and palpitations.  Gastrointestinal: Positive for abdominal distention and constipation (at times). Negative for abdominal pain.  Endocrine: Negative.   Genitourinary: Negative.   Musculoskeletal: Negative for arthralgias and back pain.  Skin: Negative.   Allergic/Immunologic: Negative.   Neurological: Negative for dizziness and light-headedness.  Hematological: Negative for adenopathy. Does not bruise/bleed easily.  Psychiatric/Behavioral: Negative for dysphoric mood and sleep disturbance (sleeping on 3 pillows with oxgyen). The patient is not nervous/anxious.    Vitals:   03/10/20 1017  BP: (!) 157/62  Pulse: 73  Resp: 18  SpO2: 94%  Weight: 173 lb (78.5 kg)  Height: 4'  11" (1.499 m)   Wt Readings from Last 3 Encounters:  03/10/20 173 lb (78.5 kg)  01/18/20 170 lb 6.4 oz (77.3 kg)  12/31/19 175 lb 6.4 oz (79.6 kg)   Lab Results  Component Value Date   CREATININE 1.47 (H) 01/18/2020   CREATININE 2.17 (H) 12/15/2019   CREATININE 2.13 (H)   12/14/2019     Physical Exam Vitals and nursing note reviewed. Exam conducted with a chaperone present ([facility] caregiver).  Constitutional:      Appearance: Normal appearance.  HENT:     Head: Normocephalic and atraumatic.  Cardiovascular:     Rate and Rhythm: Normal rate. Rhythm irregular.  Pulmonary:     Effort: Pulmonary effort is normal. No respiratory distress.     Breath sounds: No wheezing or rales.  Abdominal:     General: There is no distension.     Palpations: Abdomen is soft.  Musculoskeletal:        General: No tenderness.     Cervical back: Normal range of motion and neck supple.     Right lower leg: No edema.     Left lower leg: No edema.  Skin:    General: Skin is warm and dry.  Neurological:     General: No focal deficit present.     Mental Status: She is alert and oriented to person, place, and time.  Psychiatric:        Mood and Affect: Mood normal.        Behavior: Behavior normal.    Assessment & Plan:  1: Chronic heart failure with reduced EF- - NYHA class II - euvolemic today  - weighing herself daily; reminded her to call for an overnight weight gain of >2 pounds or a weekly weight gain of >5 pounds - on the Epic list, she has metolazone listed that she can take PRN but unsure if that's on [facility] list or not; wrote on AVS that they could consider giving it but to check BMP afterwards if they do - not adding salt but doesn't have much control of the food she eats - not planning on getting COVID vaccine - consider adding entresto at future visits but would need to be mindful of renal function - BNP 10/18/19 was 377.8  2: HTN- - BP mildly elevated today - sees PCP @ [facility] and says  that she saw the provider Lavone Neri) 2 days ago - BMP from 01/18/20 reviewed and showed sodium 145, potassium 3.6, creatinine 1.47 and GFR 32  3: Atrial fibrillation- - saw EP Caryl Comes) 01/18/20 - AV node ablation done 12/13/19  4: DM- - A1c 09/17/19 was 6.9%   Patient did not bring her medications nor a list. Each medication was verbally reviewed with the patient and she was encouraged to bring the bottles to every visit to confirm accuracy of list.  Since patient continues to follow with [facility], will not make a return appointment for patient at this time. Advised patient and caregiver with her that they could call back at anytime to make another appointment and patient was comfortable with this.

## 2020-03-10 NOTE — Patient Instructions (Addendum)
Continue weighing daily and call for an overnight weight gain of > 2 pounds or a weekly weight gain of >5 pounds.   Please consider giving metolazone for fluid retention. Please check BMP after giving metolazone.    Call us in the future if you'd like to schedule another appointment

## 2020-05-26 ENCOUNTER — Ambulatory Visit (INDEPENDENT_AMBULATORY_CARE_PROVIDER_SITE_OTHER): Payer: Medicare (Managed Care)

## 2020-05-26 DIAGNOSIS — I5022 Chronic systolic (congestive) heart failure: Secondary | ICD-10-CM | POA: Diagnosis not present

## 2020-05-30 LAB — CUP PACEART REMOTE DEVICE CHECK
Battery Remaining Longevity: 24 mo
Battery Remaining Percentage: 37 %
Brady Statistic RA Percent Paced: 0 %
Brady Statistic RV Percent Paced: 97 %
Date Time Interrogation Session: 20220415004200
Implantable Lead Implant Date: 20140813
Implantable Lead Implant Date: 20140813
Implantable Lead Location: 753859
Implantable Lead Location: 753860
Implantable Lead Model: 4469
Implantable Lead Model: 4470
Implantable Lead Serial Number: 581162
Implantable Lead Serial Number: 755425
Implantable Pulse Generator Implant Date: 20140813
Lead Channel Impedance Value: 369 Ohm
Lead Channel Impedance Value: 376 Ohm
Lead Channel Pacing Threshold Amplitude: 0.5 V
Lead Channel Pacing Threshold Amplitude: 0.9 V
Lead Channel Pacing Threshold Pulse Width: 0.4 ms
Lead Channel Pacing Threshold Pulse Width: 0.5 ms
Lead Channel Setting Pacing Amplitude: 2.5 V
Lead Channel Setting Pacing Pulse Width: 0.4 ms
Lead Channel Setting Sensing Sensitivity: 3 mV
Pulse Gen Serial Number: 161924

## 2020-06-12 NOTE — Progress Notes (Signed)
Remote pacemaker transmission.   

## 2020-07-25 ENCOUNTER — Ambulatory Visit
Admission: RE | Admit: 2020-07-25 | Discharge: 2020-07-25 | Disposition: A | Discharge status: Home / Self Care | Payer: Medicare (Managed Care) | Attending: Internal Medicine | Admitting: Internal Medicine

## 2020-07-25 ENCOUNTER — Encounter: Payer: Self-pay | Admitting: Internal Medicine

## 2020-07-25 ENCOUNTER — Ambulatory Visit
Admission: RE | Admit: 2020-07-25 | Discharge: 2020-07-25 | Disposition: A | Discharge status: Home / Self Care | Payer: Medicare (Managed Care) | Source: Ambulatory Visit | Attending: Internal Medicine | Admitting: Internal Medicine

## 2020-07-25 ENCOUNTER — Other Ambulatory Visit: Payer: Self-pay

## 2020-07-25 ENCOUNTER — Ambulatory Visit (INDEPENDENT_AMBULATORY_CARE_PROVIDER_SITE_OTHER): Payer: Medicare (Managed Care) | Admitting: Internal Medicine

## 2020-07-25 VITALS — BP 128/58 | HR 76 | Ht 59.0 in | Wt 159.6 lb

## 2020-07-25 DIAGNOSIS — R0602 Shortness of breath: Secondary | ICD-10-CM | POA: Insufficient documentation

## 2020-07-25 DIAGNOSIS — Z950 Presence of cardiac pacemaker: Secondary | ICD-10-CM

## 2020-07-25 DIAGNOSIS — I5033 Acute on chronic diastolic (congestive) heart failure: Secondary | ICD-10-CM | POA: Diagnosis not present

## 2020-07-25 DIAGNOSIS — I4819 Other persistent atrial fibrillation: Secondary | ICD-10-CM | POA: Diagnosis not present

## 2020-07-25 DIAGNOSIS — I495 Sick sinus syndrome: Secondary | ICD-10-CM | POA: Diagnosis not present

## 2020-07-25 DIAGNOSIS — Z79899 Other long term (current) drug therapy: Secondary | ICD-10-CM

## 2020-07-25 LAB — PACEMAKER DEVICE OBSERVATION

## 2020-07-25 NOTE — Progress Notes (Signed)
Patient ID: Evelyn Simmons, female   DOB: Jan 04, 1930, 87 y.o.   MRN: [id number]      Patient Care Team: Dionicia Abler, NP as PCP - General Deboraha Sprang, MD as PCP - Cardiology (Cardiology)   HPI  Evelyn Simmons is a 87 y.o. female seen in followup for  pacemaker implanted 2014  for sick sinus syndrome. Corporate investment banker).  Developed persistent atrial fibrillation prompting 2 hospitalizations for acute on chronic heart failure related to rapid rates.  With history of recurrent transfusion requiring GI bleeding, not felt to be a candidate for anticoagulation.  Underwent AV junction ablation 11/21    Coronary artery disease with stenting in 1995. catheterization 12/11-- high-grade stenosis of her RCA >>stented.     Previously on oxygen at home but was not able to manipulate life without symptoms no longer using it.   Dyspnea assoc with exertion, provoked by physical activity  , <100 feet.  Improved following ablation significantly and then has  worsened over the last 6 months   Uses a Rolator to assist with walking     Stay active with a stationary bike  Sleeps with 3 pillows due to orthopnea  The patient denies chest pain,or peripheral edema  There have been no palpitations, lightheadedness or syncope.     Date Cr K TSH LFTs Hgb  3/16  1.13 4.5 2.75 12   2/17       9.3  7/19 1.1 4.0 1.45 19 10.9  9/21 1.61 3.8   8.4<7.4  11/21 2.17 3.8<<2.8     12/21 1.47              DATE TEST EF   12/11 LHC  RCA stented   12/13 TEE 45%   8/17 Echo   >55 % AS mild (mean Grad 13)  8/21 Echo  45-50% LAE-severe  AS mild  (mG-44m)           Past Medical History:  Diagnosis Date   (HFmrEF) heart failure with mid-range ejection fraction ([facility]    a. 09/2019 Echo: EF 45-50%, glob HK, mild conc LVH, mildly elev PASP. Sev dil LA. Mildly dil RA. Mild MR.   Anemia    Arthritis    Asthma    CAD (coronary artery disease)    a. 1995 s/p PCI/BMS LAD; b. 2011 s/p PCI/DES RCA.   CHF (congestive  heart failure) (HCC)    Chronic kidney disease    COPD (chronic obstructive pulmonary disease) (HCC)    Depression    Diabetes mellitus without complication (HCC)    Diverticulitis    Dysrhythmia    GERD (gastroesophageal reflux disease)    Hyperlipidemia    Hypertension    Hypothyroid    pt unsure, not currently on medication   Ischemic cardiomyopathy    MI (myocardial infarction) ([facility]    1995   Persistent atrial fibrillation ([facility]    a. No OAC 2/2 h/o bleeding/anemia; b. Longterm amio therapy; c. 08/2019 - recurrent afib-->rate-controlled.   Presence of permanent cardiac pacemaker    Radiculopathy     Past Surgical History:  Procedure Laterality Date   ABDOMINAL HYSTERECTOMY     APPENDECTOMY     AV NODE ABLATION N/A 12/13/2019   Procedure: AV NODE ABLATION;  Surgeon: KDeboraha Sprang MD;  [facility];  Service: Cardiovascular;  Laterality: N/A;   CARDIAC CATHETERIZATION     COLONOSCOPY WITH PROPOFOL N/A 10/21/2019   Procedure: COLONOSCOPY WITH PROPOFOL;  Surgeon: VSherri Sear  Reece Levy, MD;  Location: [facility];  Service: Gastroenterology;  Laterality: N/A;   CORONARY ANGIOPLASTY WITH STENT PLACEMENT  12/11/1993   RCA   ESOPHAGOGASTRODUODENOSCOPY (EGD) WITH PROPOFOL N/A 10/21/2019   Procedure: ESOPHAGOGASTRODUODENOSCOPY (EGD) WITH PROPOFOL;  Surgeon: Lin Landsman, MD;  Location: [facility];  Service: Gastroenterology;  Laterality: N/A;   FOOT SURGERY Bilateral    Over 25 years ago- Bunionectomy   GIVENS CAPSULE STUDY N/A 10/22/2019   Procedure: GIVENS CAPSULE STUDY;  Surgeon: Lin Landsman, MD;  Location: [facility];  Service: Gastroenterology;  Laterality: N/A;   INSERT / REPLACE / REMOVE PACEMAKER  09/2012   Model # A569  serial # [id number]   JOINT REPLACEMENT     bilateral knee replacements    Current Outpatient Medications  Medication Sig Dispense Refill   acetaminophen (TYLENOL) 325 MG tablet Take 2 tablets (650 mg total) by mouth  every 4 (four) hours as needed for headache or mild pain.     aspirin EC 81 MG tablet Take 81 mg by mouth daily.     Blood Glucose Monitoring Suppl (ACCU-CHEK NANO SMARTVIEW) W/DEVICE KIT Please issue nano meter by AccuChek along with test strips at checking frequency of 2x daily. 90 day supply. E11.9 1 kit 1   carboxymethylcellul-glycerin (OPTIVE) 0.5-0.9 % ophthalmic solution Place 1 drop into both eyes in the morning, at noon, and at bedtime.     cholecalciferol (VITAMIN D) 1000 units tablet Take 1,000 Units by mouth daily.     diclofenac Sodium (VOLTAREN) 1 % GEL Apply 1 application topically 4 (four) times daily as needed (left knee pain.).      DULoxetine (CYMBALTA) 30 MG capsule Take 30 mg by mouth daily.     furosemide (LASIX) 20 MG tablet Take 20 mg by mouth daily as needed.     glucose blood (FREESTYLE LITE) test strip Use as instructed 200 each 3   Lancets (FREESTYLE) lancets Use as instructed 200 each 3   lidocaine (LIDODERM) 5 % Place 1 patch onto the skin daily as needed (knee pain.). Remove & Discard patch within 12 hours or as directed by MD     loratadine (CLARITIN) 10 MG tablet Take 10 mg by mouth daily.     metolazone (ZAROXOLYN) 2.5 MG tablet 2.5 mg every Monday, Wednesday, and Friday.     MIRALAX 17 GM/SCOOP powder SMARTSIG:17 Gram(s) By Mouth Daily PRN     Multiple Vitamins-Minerals (PRESERVISION AREDS 2+MULTI VIT) CAPS Take 1 capsule by mouth 2 (two) times daily.     naloxone (NARCAN) nasal spray 4 mg/0.1 mL Place 1 spray into the nose See admin instructions. Administer 1 spray in one nostril. Repeat every 3 minutes if no response.     nitroGLYCERIN (NITROSTAT) 0.4 MG SL tablet Place 0.4 mg under the tongue every 5 (five) minutes x 3 doses as needed for chest pain.      nystatin (MYCOSTATIN/NYSTOP) powder Apply 1 application topically 4 (four) times daily as needed (fungal infection).     nystatin-triamcinolone (MYCOLOG II) cream Apply 1 application topically 3 (three) times  daily as needed (affected area(s)).     pantoprazole (PROTONIX) 20 MG tablet Take 20 mg by mouth daily.     polyethylene glycol (MIRALAX / GLYCOLAX) 17 g packet Take 17 g by mouth daily as needed (constipation.).      potassium chloride 20 MEQ/15ML (10%) SOLN Take 15 mLs (20 mEq total) by mouth daily. 473 mL 0   QUEtiapine (SEROQUEL) 25 MG tablet Take  25 mg by mouth at bedtime.      rosuvastatin (CRESTOR) 40 MG tablet Take 1 tablet (40 mg total) by mouth daily. 90 tablet 3   Sennosides-Docusate Sodium (SENEXON-S PO) Take 2 tablets by mouth daily as needed (constipation.).      sodium chloride (OCEAN) 0.65 % nasal spray Place 1 spray into the nose 4 (four) times daily as needed for congestion (nasal dryness).     torsemide (DEMADEX) 20 MG tablet Take 20 mg by mouth 2 (two) times daily.     traZODone (DESYREL) 100 MG tablet Take 100 mg by mouth at bedtime.     vitamin B-12 1000 MCG tablet Take 1 tablet (1,000 mcg total) by mouth daily. 30 tablet 1   No current facility-administered medications for this visit.    Allergies  Allergen Reactions   Penicillins Nausea And Vomiting, Rash and Hives   Other Other (See Comments)   Sulfa Antibiotics     Pt unsure reaction, was told in hospital she had allergy   Penicillin G Rash    Review of Systems negative except from HPI and PMH  Physical Exam BP (!) 128/58 (BP Location: Left Arm, Patient Position: Sitting, Cuff Size: Normal)   Pulse 76   Ht '4\' 11"'  (1.499 m)   Wt 159 lb 9.6 oz (72.4 kg) Comment: with back brace on.  SpO2 91%   BMI 32.24 kg/m  Well developed and well nourished in no acute distress HENT normal Neck supple with JVP-+10 Device pocket well healed; without hematoma or erythema.  There is no tethering  Mildly Irregular rate and rhythm,No gallop 3/6 systolic murmur with a retained split S2 Crackles L 1/2 up and  right 20 base on on the R,  Abd-soft with active BS No Clubbing cyanosis No LE  edema Skin-warm and dry A &  Oriented  Grossly normal sensory and motor function  ECG atrial fibrillation with underlying ventricular pacing at 75 and frequent PVCs Interval-/16/45     Assessment and  Plan  Complete heart block s/p AV ablation  Pacemaker-[place]  Atrial fibrillation-persistent>> permanent   Hypertension   Ischemic heart disease with prior stenting  HFpEF  GI bleeding precluding anticoagulation   Anemia  Renal insufficiency acute/chronic   Functional status improved and then worsened following AV junction ablation.  Heart rate excursion is adequate so I do not think it relates to the last reprogramming.  There is evidence of volume overload with venous distention.  Her diuretic regime is complicated and so prior to making any changes, we will need to reassess her metabolic profile.  For now we will continue her on her torsemide 40 and metolazone 2.5 3 times a week.  With her history of renal insufficiency she could also be anemic.  We will check her CBC.  Is possible that her shortness of breath relates to ischemia.  If her BNP is unrevealing, would try empiric low-dose isosorbide 15 mg.  Continue low-dose aspirin at 81 mg  Continue rosuvastatin 40 mg daily.  LDL was at target 10 months ago.  Atrial fibrillation is permanent.  Not on anticoagulation because of a history of GI bleeding.    I,Stephanie Williams,acting as a Education administrator for Virl Axe, MD.,have documented all relevant documentation on the behalf of Virl Axe, MD,as directed by  Virl Axe, MD while in the presence of Virl Axe, MD.  I, Virl Axe, MD, have reviewed all documentation for this visit. The documentation on 07/25/20 for the exam, diagnosis, procedures, and  orders are all accurate and complete.

## 2020-07-25 NOTE — Patient Instructions (Signed)
Medication Instructions:  - Your physician recommends that you continue on your current medications as directed. Please refer to the Current Medication list given to you today.  - We may need to make changes to your fluid pills depending on the results of your lab work/ Chest x-ray  *If you need a refill on your cardiac medications before your next appointment, please call your pharmacy*   Lab Work: - Your physician recommends that you have lab work today: BNP/ CBC/ BMET/ Ionized Calcium  If you have labs (blood work) drawn today and your tests are completely normal, you will receive your results only by: [place] (if you have MyChart) OR A paper copy in the mail If you have any lab test that is abnormal or we need to change your treatment, we will call you to review the results.   Testing/Procedures:  - A chest x-ray takes a picture of the organs and structures inside the chest, including the heart, lungs, and blood vessels. This test can show several things, including, whether the heart is enlarges; whether fluid is building up in the lungs; and whether pacemaker / defibrillator leads are still in place.  Please go to the [facility] 1st desk on the right to check in (Registration)   Follow-Up: At [facility], you and your health needs are our priority.  As part of our continuing mission to provide you with exceptional heart care, we have created designated Provider Care Teams.  These Care Teams include your primary Cardiologist (physician) and Advanced Practice Providers (APPs -  Physician Assistants and Nurse Practitioners) who all work together to provide you with the care you need, when you need it.  We recommend signing up for the patient portal called "MyChart".  Sign up information is provided on this After Visit Summary.  MyChart is used to connect with patients for Virtual Visits (Telemedicine).  Patients are able to view lab/test results, encounter  notes, upcoming appointments, etc.  Non-urgent messages can be sent to your provider as well.   To learn more about what you can do with MyChart, go to [email or web address].    Your next appointment:   1 year(s)  The format for your next appointment:   In Person  Provider:   Virl Axe, MD   Other Instructions N/a

## 2020-07-28 ENCOUNTER — Telehealth: Payer: Self-pay | Admitting: Internal Medicine

## 2020-07-28 DIAGNOSIS — R0602 Shortness of breath: Secondary | ICD-10-CM

## 2020-07-28 NOTE — Telephone Encounter (Signed)
Attempted to call the patient. No answer- I left a message to please call back.  

## 2020-07-28 NOTE — Telephone Encounter (Signed)
I spoke with the patient. I have advised her of her chest x-ray report results and she voices understanding.  I also advised her I did not see that she had her lab work drawn this week. She confirmed she went upstairs for the x-ray, but states she did not get stuck.  I have confirmed with the patient that she can have labs drawn at [facility]. She states she goes there on [address]. I have advised her I will call [facility] this afternoon to confirm their fax and get her orders sent over.  The patient voices understanding and is agreeable.

## 2020-07-28 NOTE — Telephone Encounter (Signed)
[place], [place] - 07/25/20 Deboraha Sprang, MD [phone])  Sent: Thu July 27, 2020  1:43 PM  To: Emily Filbert, RN; Thora Lance, RN          Result Note  Please Inform Patient that study was normal but the chest x-ray was normal with elevated JVP also raises the possibility of a pulmonary embolism; she is not anticoagulated.  We will check a D-dimer please    Thanks

## 2020-07-31 NOTE — Telephone Encounter (Signed)
Attempted to call [facility] to see if we could coordinate the patient having lab work for Dr. Caryl Comes done there. Per phone staff, they will have to take a message and have someone call me back.  Currently waiting on a call back.

## 2020-08-02 ENCOUNTER — Other Ambulatory Visit: Payer: Self-pay | Admitting: Adult Health

## 2020-08-02 DIAGNOSIS — I5032 Chronic diastolic (congestive) heart failure: Secondary | ICD-10-CM

## 2020-08-03 NOTE — Telephone Encounter (Signed)
No call back received from [facility] as to whether or not they can draw the patient's lab work.  I called the back today and was able to speak with Detrice in the clinic area. She advised they can draw labs on the patient for [place].   I advised we will need: BMP/ CBC/ BNP/ ionized calcium/ d-dimer per Dr. Caryl Comes.  I can fax orders to [phone] Attn: Detrice

## 2020-08-04 ENCOUNTER — Encounter: Payer: Self-pay | Admitting: Internal Medicine

## 2020-08-04 NOTE — Telephone Encounter (Signed)
Late entry: Fax confirmation received yesterday that lab orders were received at [facility].

## 2020-08-23 ENCOUNTER — Encounter: Payer: Self-pay | Admitting: Internal Medicine

## 2020-08-23 NOTE — Telephone Encounter (Signed)
I spoke with [facility]. I inquired the patient had her lab work done.  Per [facility] rep the patient had lab work a couple of weeks ago and again today. They will fax these results- they did confirm our fax #.

## 2020-08-24 NOTE — Telephone Encounter (Signed)
Labs received from [facility]:  6/24- D-Dimer 7/13- CMET/ CBC/ TSH  I called to confirm if they also drew the Ionized Calcium & BNP that we had originally requested as well.  Per Denman George ([facility]- clinical area) these 2 labs were not drawn on the patient.  The patient's D-dimer did come back at 0.88- message sent to Dr. Caryl Comes to advise.

## 2020-08-25 NOTE — Telephone Encounter (Signed)
Had Dr. Caryl Comes review elevated D-dimer from [facility].  He advised that we will just follow this for now based on her age- elevated result may be due to HFpef.  He would like to see her back sooner than 1 year.   Will reach out to scheduling to have them schedule a 3 month follow up with Dr. Caryl Comes.  I have notified the patient of the above and she is aware and agreeable.

## 2020-09-13 NOTE — Telephone Encounter (Signed)
To scheduling-- not sure if anyone ever tried to reach out to schedule this patient with Dr. Caryl Comes. Do you mind trying calling to schedule her. She should be seen back in October.  Thanks!

## 2020-10-12 ENCOUNTER — Other Ambulatory Visit: Payer: Self-pay | Admitting: Adult Health

## 2020-10-12 DIAGNOSIS — K469 Unspecified abdominal hernia without obstruction or gangrene: Secondary | ICD-10-CM

## 2020-10-12 DIAGNOSIS — R109 Unspecified abdominal pain: Secondary | ICD-10-CM

## 2020-10-30 ENCOUNTER — Ambulatory Visit: Payer: Medicare (Managed Care)

## 2020-11-07 ENCOUNTER — Emergency Department: Payer: Medicare (Managed Care)

## 2020-11-07 ENCOUNTER — Emergency Department
Admission: EM | Admit: 2020-11-07 | Discharge: 2020-11-08 | Disposition: A | Discharge status: Home / Self Care | Payer: Medicare (Managed Care) | Attending: Emergency Medicine | Admitting: Emergency Medicine

## 2020-11-07 ENCOUNTER — Other Ambulatory Visit: Payer: Self-pay

## 2020-11-07 DIAGNOSIS — I5023 Acute on chronic systolic (congestive) heart failure: Secondary | ICD-10-CM | POA: Diagnosis not present

## 2020-11-07 DIAGNOSIS — Z7982 Long term (current) use of aspirin: Secondary | ICD-10-CM | POA: Insufficient documentation

## 2020-11-07 DIAGNOSIS — Z79899 Other long term (current) drug therapy: Secondary | ICD-10-CM | POA: Diagnosis not present

## 2020-11-07 DIAGNOSIS — J449 Chronic obstructive pulmonary disease, unspecified: Secondary | ICD-10-CM | POA: Insufficient documentation

## 2020-11-07 DIAGNOSIS — S0990XA Unspecified injury of head, initial encounter: Secondary | ICD-10-CM | POA: Diagnosis not present

## 2020-11-07 DIAGNOSIS — M545 Low back pain, unspecified: Secondary | ICD-10-CM | POA: Diagnosis not present

## 2020-11-07 DIAGNOSIS — E039 Hypothyroidism, unspecified: Secondary | ICD-10-CM | POA: Insufficient documentation

## 2020-11-07 DIAGNOSIS — R110 Nausea: Secondary | ICD-10-CM | POA: Insufficient documentation

## 2020-11-07 DIAGNOSIS — I2510 Atherosclerotic heart disease of native coronary artery without angina pectoris: Secondary | ICD-10-CM | POA: Insufficient documentation

## 2020-11-07 DIAGNOSIS — N183 Chronic kidney disease, stage 3 unspecified: Secondary | ICD-10-CM | POA: Insufficient documentation

## 2020-11-07 DIAGNOSIS — I4819 Other persistent atrial fibrillation: Secondary | ICD-10-CM | POA: Diagnosis not present

## 2020-11-07 DIAGNOSIS — D631 Anemia in chronic kidney disease: Secondary | ICD-10-CM | POA: Insufficient documentation

## 2020-11-07 DIAGNOSIS — N3001 Acute cystitis with hematuria: Secondary | ICD-10-CM | POA: Insufficient documentation

## 2020-11-07 DIAGNOSIS — Z96653 Presence of artificial knee joint, bilateral: Secondary | ICD-10-CM | POA: Diagnosis not present

## 2020-11-07 DIAGNOSIS — S59901A Unspecified injury of right elbow, initial encounter: Secondary | ICD-10-CM | POA: Diagnosis present

## 2020-11-07 DIAGNOSIS — J45909 Unspecified asthma, uncomplicated: Secondary | ICD-10-CM | POA: Diagnosis not present

## 2020-11-07 DIAGNOSIS — S50311A Abrasion of right elbow, initial encounter: Secondary | ICD-10-CM | POA: Insufficient documentation

## 2020-11-07 DIAGNOSIS — Z87891 Personal history of nicotine dependence: Secondary | ICD-10-CM | POA: Diagnosis not present

## 2020-11-07 DIAGNOSIS — I130 Hypertensive heart and chronic kidney disease with heart failure and stage 1 through stage 4 chronic kidney disease, or unspecified chronic kidney disease: Secondary | ICD-10-CM | POA: Insufficient documentation

## 2020-11-07 DIAGNOSIS — E1122 Type 2 diabetes mellitus with diabetic chronic kidney disease: Secondary | ICD-10-CM | POA: Diagnosis not present

## 2020-11-07 DIAGNOSIS — W19XXXA Unspecified fall, initial encounter: Secondary | ICD-10-CM | POA: Insufficient documentation

## 2020-11-07 DIAGNOSIS — Z950 Presence of cardiac pacemaker: Secondary | ICD-10-CM | POA: Insufficient documentation

## 2020-11-07 DIAGNOSIS — T148XXA Other injury of unspecified body region, initial encounter: Secondary | ICD-10-CM

## 2020-11-07 LAB — URINALYSIS, COMPLETE (UACMP) WITH MICROSCOPIC
Bilirubin Urine: NEGATIVE
Glucose, UA: NEGATIVE mg/dL
Ketones, ur: NEGATIVE mg/dL
Nitrite: NEGATIVE
Protein, ur: 100 mg/dL — AB
Specific Gravity, Urine: 1.011 (ref 1.005–1.030)
WBC, UA: >50 WBC/hpf — ABNORMAL HIGH (ref 0–5)
pH: 5 (ref 5.0–8.0)

## 2020-11-07 MED ORDER — ONDANSETRON 4 MG PO TBDP
4.0000 mg | ORAL_TABLET | Freq: Once | ORAL | Status: AC
  Administered 2020-11-07: 4 mg via ORAL
  Filled 2020-11-07: qty 1

## 2020-11-07 MED ORDER — CEPHALEXIN 500 MG PO CAPS: 500.0000 mg | ORAL_CAPSULE | Freq: Four times a day (QID) | ORAL | 0 refills | Status: AC

## 2020-11-07 MED ORDER — BACITRACIN ZINC 500 UNIT/GM EX OINT: TOPICAL_OINTMENT | CUTANEOUS | 0 refills | Status: DC

## 2020-11-07 MED ORDER — CEPHALEXIN 500 MG PO CAPS: 500.0000 mg | ORAL_CAPSULE | Freq: Once | ORAL | Status: DC

## 2020-11-07 MED ORDER — CEFTRIAXONE SODIUM 1 G IJ SOLR
1.0000 g | Freq: Once | INTRAMUSCULAR | Status: AC
  Administered 2020-11-07: 1 g via INTRAMUSCULAR
  Filled 2020-11-07: qty 10

## 2020-11-07 NOTE — ED Provider Notes (Addendum)
[facility] EMERGENCY DEPARTMENT Provider Note   CSN: [id number] Arrival date & time: 11/07/20  2025     History Chief Complaint  Patient presents with   Evelyn Simmons    Evelyn Simmons is a 87 y.o. female.  Presents to the emergency department via EMS for evaluation of a fall that occurred just prior to arrival today.  Patient rolled out of her bed onto the floor while reaching to get the phone.  She landed on her right elbow, suffered a mild abrasion.  She states she initially had some mild back pain but no longer complaining of back pain.  She denies loss of consciousness.  She is uncertain if she hit her head.  She has complained of some mild nausea that has been persistent throughout the day even before she hit her head.  She denies any abdominal pain, constipation, fevers, chills, body aches, urinary symptoms, increase in urinary frequency, rashes, diarrhea, chest pain, shortness of breath, diaphoresis.  HPI     Past Medical History:  Diagnosis Date   (HFmrEF) heart failure with mid-range ejection fraction ([place])    a. 09/2019 Echo: EF 45-50%, glob HK, mild conc LVH, mildly elev PASP. Sev dil LA. Mildly dil RA. Mild MR.   Anemia    Arthritis    Asthma    CAD (coronary artery disease)    a. 1995 s/p PCI/BMS LAD; b. 2011 s/p PCI/DES RCA.   CHF (congestive heart failure) (HCC)    Chronic kidney disease    COPD (chronic obstructive pulmonary disease) (HCC)    Depression    Diabetes mellitus without complication (HCC)    Diverticulitis    Dysrhythmia    GERD (gastroesophageal reflux disease)    Hyperlipidemia    Hypertension    Hypothyroid    pt unsure, not currently on medication   Ischemic cardiomyopathy    MI (myocardial infarction) ([place])    1995   Persistent atrial fibrillation ([place])    a. No OAC 2/2 h/o bleeding/anemia; b. Longterm amio therapy; c. 08/2019 - recurrent afib-->rate-controlled.   Presence of permanent cardiac pacemaker    Radiculopathy      Patient Active Problem List   Diagnosis Date Noted   Complete heart block ([place]) 12/13/2019   Black stools    Acute on chronic heart failure with preserved ejection fraction (HFpEF) (HCC)    Acute on chronic systolic CHF (congestive heart failure) ([place]) 10/18/2019   Diabetes mellitus type 2 in obese ([place]) 10/18/2019   Anemia due to chronic kidney disease 10/18/2019   COPD (chronic obstructive pulmonary disease) ([place]) 10/18/2019   Toe pain, right 10/18/2019   Persistent atrial fibrillation (HCC)    Acute CHF (congestive heart failure) ([place]) 10/04/2019   Chronic respiratory failure with hypoxia (HCC)    CHF (congestive heart failure) ([place]) 09/18/2019   Atrial fibrillation with rapid ventricular response ([place]) 62/69/4854   Acute diastolic CHF (congestive heart failure) ([place]) 09/17/2019   Mobility impaired 05/29/2014   CAD (coronary artery disease) 05/09/2014   History of coronary artery stent placement 05/09/2014   Hyperlipidemia 05/09/2014   Bilateral low back pain with left-sided sciatica 04/21/2014   Knee effusion 04/21/2014   CKD stage 3 due to type 2 diabetes mellitus ([place]) 04/21/2014   Pacemaker 04/21/2014   Encounter to establish care 04/20/2014    Past Surgical History:  Procedure Laterality Date   ABDOMINAL HYSTERECTOMY     APPENDECTOMY     AV NODE ABLATION N/A 12/13/2019   Procedure: AV  NODE ABLATION;  Surgeon: Deboraha Sprang, MD;  Location: [facility];  Service: Cardiovascular;  Laterality: N/A;   CARDIAC CATHETERIZATION     COLONOSCOPY WITH PROPOFOL N/A 10/21/2019   Procedure: COLONOSCOPY WITH PROPOFOL;  Surgeon: Lin Landsman, MD;  Location: [facility] ENDOSCOPY;  Service: Gastroenterology;  Laterality: N/A;   CORONARY ANGIOPLASTY WITH STENT PLACEMENT  12/11/1993   RCA   ESOPHAGOGASTRODUODENOSCOPY (EGD) WITH PROPOFOL N/A 10/21/2019   Procedure: ESOPHAGOGASTRODUODENOSCOPY (EGD) WITH PROPOFOL;  Surgeon: Lin Landsman, MD;  Location: [facility];   Service: Gastroenterology;  Laterality: N/A;   FOOT SURGERY Bilateral    Over 25 years ago- Bunionectomy   GIVENS CAPSULE STUDY N/A 10/22/2019   Procedure: GIVENS CAPSULE STUDY;  Surgeon: Lin Landsman, MD;  Location: [facility] ENDOSCOPY;  Service: Gastroenterology;  Laterality: N/A;   INSERT / REPLACE / REMOVE PACEMAKER  09/2012   Model # S937  serial # [id number]   JOINT REPLACEMENT     bilateral knee replacements     OB History   No obstetric history on file.     Family History  Problem Relation Age of Onset   Arthritis Mother    Heart disease Mother    Hypertension Mother    Diabetes Mother    Arthritis Father    Heart disease Father    Hypertension Father    Cancer Sister        breast cancer   Diabetes Brother    Cancer Daughter        lung cancer   Diabetes Sister     Social History   Tobacco Use   Smoking status: Former    Types: Cigarettes    Quit date: 12/10/1993    Years since quitting: 26.9   Smokeless tobacco: Former  Scientific laboratory technician Use: Never used  Substance Use Topics   Alcohol use: Yes    Alcohol/week: 0.0 standard drinks    Comment: Rare   Drug use: No    Home Medications Prior to Admission medications   Medication Sig Start Date End Date Taking? Authorizing Provider  bacitracin ointment Apply to right elbow with Band-Aid 11/07/20 11/07/21 Yes Duanne Guess, PA-C  cephALEXin (KEFLEX) 500 MG capsule Take 1 capsule (500 mg total) by mouth 4 (four) times daily for 7 days. 11/07/20 11/14/20 Yes Duanne Guess, PA-C  acetaminophen (TYLENOL) 325 MG tablet Take 2 tablets (650 mg total) by mouth every 4 (four) hours as needed for headache or mild pain. 12/15/19   Shirley Friar, PA-C  aspirin EC 81 MG tablet Take 81 mg by mouth daily.    [provider]  Blood Glucose Monitoring Suppl (ACCU-CHEK NANO SMARTVIEW) W/DEVICE KIT Please issue nano meter by AccuChek along with test strips at checking frequency of 2x daily. 90 day supply.  E11.9 05/13/14   Phadke, Karsten Ro, MD  carboxymethylcellul-glycerin (OPTIVE) 0.5-0.9 % ophthalmic solution Place 1 drop into both eyes in the morning, at noon, and at bedtime.    [provider]  cholecalciferol (VITAMIN D) 1000 units tablet Take 1,000 Units by mouth daily.    [provider]  diclofenac Sodium (VOLTAREN) 1 % GEL Apply 1 application topically 4 (four) times daily as needed (left knee pain.).     [provider]  DULoxetine (CYMBALTA) 30 MG capsule Take 30 mg by mouth daily.    [provider]  furosemide (LASIX) 20 MG tablet Take 20 mg by mouth daily as needed. 03/08/20  [provider]  glucose blood (FREESTYLE LITE) test strip Use as instructed 05/30/14   Rubbie Battiest, RN  Lancets (FREESTYLE) lancets Use as instructed 05/30/14   Rubbie Battiest, RN  lidocaine (LIDODERM) 5 % Place 1 patch onto the skin daily as needed (knee pain.). Remove & Discard patch within 12 hours or as directed by MD    [provider]  loratadine (CLARITIN) 10 MG tablet Take 10 mg by mouth daily.    [provider]  metolazone (ZAROXOLYN) 2.5 MG tablet 2.5 mg every Monday, Wednesday, and Friday. 11/18/19   [provider]  MIRALAX 17 GM/SCOOP powder SMARTSIG:17 Gram(s) By Mouth Daily PRN 04/18/20   [provider]  Multiple Vitamins-Minerals (PRESERVISION AREDS 2+MULTI VIT) CAPS Take 1 capsule by mouth 2 (two) times daily.    [provider]  naloxone [facility]) nasal spray 4 mg/0.1 mL Place 1 spray into the nose See admin instructions. Administer 1 spray in one nostril. Repeat every 3 minutes if no response.    [provider]  nitroGLYCERIN (NITROSTAT) 0.4 MG SL tablet Place 0.4 mg under the tongue every 5 (five) minutes x 3 doses as needed for chest pain.     [provider]  nystatin (MYCOSTATIN/NYSTOP) powder Apply 1 application topically 4 (four) times daily as needed (fungal infection).    [provider]  nystatin-triamcinolone (MYCOLOG II) cream Apply 1 application topically 3 (three) times daily as needed (affected area(s)).    [provider]  pantoprazole (PROTONIX) 20 MG tablet Take 20 mg by mouth daily.    [provider]  polyethylene glycol (MIRALAX / GLYCOLAX) 17 g packet Take 17 g by mouth daily as needed (constipation.).     [provider]  potassium chloride 20 MEQ/15ML (10%) SOLN Take 15 mLs (20 mEq total) by mouth daily. 12/16/19   Shirley Friar, PA-C  QUEtiapine (SEROQUEL) 25 MG tablet Take 25 mg by mouth at bedtime.     [provider]  rosuvastatin (CRESTOR) 40 MG tablet Take 1 tablet (40 mg total) by mouth daily. 05/09/14   Minna Merritts, MD  Sennosides-Docusate Sodium (SENEXON-S PO) Take 2 tablets by mouth daily as needed (constipation.).     [provider]  sodium chloride (OCEAN) 0.65 % nasal spray Place 1 spray into the nose 4 (four) times daily as needed for congestion (nasal dryness).    [provider]  torsemide (DEMADEX) 20 MG tablet Take 2 tablets (40 mg) by mouth once daily    [provider]  traZODone (DESYREL) 100 MG tablet Take 100 mg by mouth at bedtime.    [provider]  vitamin B-12 1000 MCG tablet Take 1 tablet (1,000 mcg total) by mouth daily. 10/11/19   Ezekiel Slocumb, DO    Allergies    Penicillins, Other, Sulfa antibiotics, and Penicillin g  Review of Systems   Review of Systems  Constitutional:  Negative for activity change.  HENT:  Negative for congestion and trouble swallowing.   Eyes:  Negative for pain and visual disturbance.  Respiratory:  Negative for cough, chest tightness and shortness of breath.   Cardiovascular:  Negative for chest pain and leg swelling.  Gastrointestinal:  Positive for nausea. Negative for abdominal pain.  Genitourinary:  Negative for difficulty urinating, dysuria, flank pain and pelvic pain.  Musculoskeletal:   Negative for arthralgias, gait problem, joint swelling, myalgias, neck pain and neck stiffness.  Skin:  Negative for wound.  Neurological:  Negative for dizziness, syncope, weakness, light-headedness, numbness and headaches.  Psychiatric/Behavioral:  Negative for confusion and decreased concentration.    Physical Exam Updated Vital Signs BP 108/60 (BP Location: Left Arm)   Pulse 70   Temp 98.4 F (36.9 C) (Oral)   Resp 18   Ht '4\' 11"'  (1.499 m)   Wt 73.9 kg   SpO2 90%   BMI 32.92 kg/m   Physical Exam Constitutional:      General: She is not in acute distress.    Appearance: Normal appearance. She is well-developed. She is not ill-appearing.  HENT:     Head: Normocephalic and atraumatic.     Right Ear: External ear normal.     Left Ear: External ear normal.     Nose: Nose normal.  Eyes:     Conjunctiva/sclera: Conjunctivae normal.     Pupils: Pupils are equal, round, and reactive to light.  Cardiovascular:     Rate and Rhythm: Normal rate and regular rhythm.     Pulses: Normal pulses.     Heart sounds: Normal heart sounds.  Pulmonary:     Effort: Pulmonary effort is normal. No respiratory distress.     Breath sounds: Normal breath sounds.  Abdominal:     General: Abdomen is flat. Bowel sounds are normal. There is no distension.     Palpations: Abdomen is soft.     Tenderness: There is no abdominal tenderness. There is no guarding.  Musculoskeletal:        General: No deformity. Normal range of motion.     Cervical back: Normal range of motion.  Skin:    General: Skin is warm and dry.     Findings: No rash.  Neurological:     Mental Status: She is alert and oriented to person, place, and time.     Cranial Nerves: No cranial nerve deficit.     Coordination: Coordination normal.  Psychiatric:        Behavior: Behavior normal.        Thought Content: Thought content normal.    ED Results / Procedures / Treatments   Labs (all labs ordered are listed, but only  abnormal results are displayed) Labs Reviewed  URINALYSIS, COMPLETE (UACMP) WITH MICROSCOPIC - Abnormal; Notable for the following components:      Result Value   Color, Urine YELLOW (*)    APPearance CLOUDY (*)    Hgb urine dipstick MODERATE (*)    Protein, ur 100 (*)    Leukocytes,Ua LARGE (*)    WBC, UA >50 (*)    Bacteria, UA RARE (*)    All other components within normal limits  URINE CULTURE    EKG None  Radiology DG Elbow 2 Views Right  Result Date: 11/07/2020 CLINICAL DATA:  [place]. Laceration on elbow. EXAM: RIGHT ELBOW - 2 VIEW COMPARISON:  None. FINDINGS: The joint spaces are maintained. No acute elbow fracture is identified. No elbow joint effusion. Vascular calcifications. No radiopaque foreign body. IMPRESSION: No acute bony findings or joint effusion. Electronically Signed   By: Marijo Sanes M.D.   On: 11/07/2020 21:48   CT HEAD WO CONTRAST (5MM)  Result Date: 11/07/2020 CLINICAL DATA:  Mechanical fall EXAM: CT HEAD WITHOUT CONTRAST CT CERVICAL SPINE WITHOUT CONTRAST TECHNIQUE: Multidetector CT imaging of the head and cervical spine was performed following the standard protocol without intravenous contrast. Multiplanar CT image reconstructions of the cervical spine were also generated. COMPARISON:  None. FINDINGS: CT HEAD FINDINGS Brain: No acute territorial  infarction, hemorrhage or intracranial mass is visualized. Moderate atrophy. Moderate chronic small vessel ischemic changes of the white matter. Prominent ventricles felt secondary to atrophy Vascular: No hyperdense vessels. Vertebral and carotid vascular calcification Skull: Normal. Negative for fracture or focal lesion. Sinuses/Orbits: No acute finding. Other: None CT CERVICAL SPINE FINDINGS Alignment: Straightening of the cervical spine. No subluxation. Facet alignment within normal limits Skull base and vertebrae: No acute fracture. No primary bone lesion or focal pathologic process. Soft tissues and spinal canal: No  prevertebral fluid or swelling. No visible canal hematoma. Disc levels: Severe degenerative changes C3 through T1 with disc space narrowing, sclerosis and osteophyte. Advanced facet degenerative changes at multiple levels with foraminal narrowing. Upper chest: Emphysema.  Right apical scar Other: None IMPRESSION: 1. No CT evidence for acute intracranial abnormality. Atrophy and chronic small vessel ischemic changes of the white matter 2. Straightening of the cervical spine with advanced degenerative changes. No acute osseous abnormality. Electronically Signed   By: Donavan Foil M.D.   On: 11/07/2020 21:48   CT Cervical Spine Wo Contrast  Result Date: 11/07/2020 CLINICAL DATA:  Mechanical fall EXAM: CT HEAD WITHOUT CONTRAST CT CERVICAL SPINE WITHOUT CONTRAST TECHNIQUE: Multidetector CT imaging of the head and cervical spine was performed following the standard protocol without intravenous contrast. Multiplanar CT image reconstructions of the cervical spine were also generated. COMPARISON:  None. FINDINGS: CT HEAD FINDINGS Brain: No acute territorial infarction, hemorrhage or intracranial mass is visualized. Moderate atrophy. Moderate chronic small vessel ischemic changes of the white matter. Prominent ventricles felt secondary to atrophy Vascular: No hyperdense vessels. Vertebral and carotid vascular calcification Skull: Normal. Negative for fracture or focal lesion. Sinuses/Orbits: No acute finding. Other: None CT CERVICAL SPINE FINDINGS Alignment: Straightening of the cervical spine. No subluxation. Facet alignment within normal limits Skull base and vertebrae: No acute fracture. No primary bone lesion or focal pathologic process. Soft tissues and spinal canal: No prevertebral fluid or swelling. No visible canal hematoma. Disc levels: Severe degenerative changes C3 through T1 with disc space narrowing, sclerosis and osteophyte. Advanced facet degenerative changes at multiple levels with foraminal narrowing.  Upper chest: Emphysema.  Right apical scar Other: None IMPRESSION: 1. No CT evidence for acute intracranial abnormality. Atrophy and chronic small vessel ischemic changes of the white matter 2. Straightening of the cervical spine with advanced degenerative changes. No acute osseous abnormality. Electronically Signed   By: Donavan Foil M.D.   On: 11/07/2020 21:48    Procedures Procedures   Medications Ordered in ED Medications  ondansetron (ZOFRAN-ODT) disintegrating tablet 4 mg (has no administration in time range)  cefTRIAXone (ROCEPHIN) injection 1 g (has no administration in time range)    ED Course  I have reviewed the triage vital signs and the nursing notes.  Pertinent labs & imaging results that were available during my care of the patient were reviewed by me and considered in my medical decision making (see chart for details).    MDM Rules/Calculators/A&P                         87 year old female with fall out of her bed earlier today.  Uncertain if she hit her head. She suffered a mild abrasion to the right elbow, denies pain.  X-rays negative of the right elbow.  She had some complaints of mild nausea, no LOC or vomiting.  She had a negative CT of the cervical spine and head.  Patient states she has had nausea  prior to her fall today.  She woke up with nausea.  She will intermittently develop nausea with no known reason.  Vital signs are stable.  No reports of fevers.  She denies any cardiac, abdominal or urinary symptoms.  Physical exam unremarkable.  Recommended blood work, patient refused any blood work.  She was agreeable to a urinalysis.  Patient appears well in no distress.  No signs of confusion.  Urinalysis positive for UTI.  She is given 1 g of ceftriaxone IM and started on Keflex.  Urine culture pending.  She understands signs and symptoms return to the ER for.  Patient stable and ready for discharged home   Final Clinical Impression(s) / ED Diagnoses Final diagnoses:   Fall, initial encounter  Injury of head, initial encounter  Abrasion right elbow  Nausea  Acute cystitis with hematuria    Rx / DC Orders ED Discharge Orders          Ordered    bacitracin ointment        11/07/20 2247    cephALEXin (KEFLEX) 500 MG capsule  4 times daily        11/07/20 2319             Duanne Guess, PA-C 11/07/20 2325    Duanne Guess, PA-C 11/07/20 2326    Naaman Plummer, MD 11/08/20 [id number]

## 2020-11-07 NOTE — ED Triage Notes (Signed)
EMS brings pt in from [facility]; mechanical fall tonight; denies LOC or head injury

## 2020-11-07 NOTE — ED Triage Notes (Signed)
Skin tear on r elbow.

## 2020-11-07 NOTE — Discharge Instructions (Addendum)
Please take antibiotics as prescribed for urinary tract infection.  Return for any fevers, back pain, worsening symptoms or urgent changes in your health.

## 2020-11-07 NOTE — ED Triage Notes (Signed)
Pt reached to answer her phone and fell in the floor, she landed on her back. Denies any pain.  EMS asked her if she wanted to be checked out and she stated she might as well because she has been nauseous all day even before the fall.

## 2020-11-09 LAB — URINE CULTURE
Culture: >=100000 — AB
Special Requests: NORMAL

## 2020-11-21 ENCOUNTER — Ambulatory Visit
Admission: RE | Admit: 2020-11-21 | Discharge: 2020-11-21 | Disposition: A | Discharge status: Home / Self Care | Payer: Medicare (Managed Care) | Source: Ambulatory Visit | Attending: Adult Health | Admitting: Adult Health

## 2020-11-21 ENCOUNTER — Other Ambulatory Visit: Payer: Self-pay

## 2020-11-21 DIAGNOSIS — R109 Unspecified abdominal pain: Secondary | ICD-10-CM

## 2020-11-21 DIAGNOSIS — K469 Unspecified abdominal hernia without obstruction or gangrene: Secondary | ICD-10-CM | POA: Diagnosis not present

## 2020-12-05 ENCOUNTER — Other Ambulatory Visit: Payer: Self-pay

## 2020-12-05 ENCOUNTER — Ambulatory Visit (INDEPENDENT_AMBULATORY_CARE_PROVIDER_SITE_OTHER): Payer: Medicare (Managed Care) | Admitting: Internal Medicine

## 2020-12-05 ENCOUNTER — Encounter: Payer: Self-pay | Admitting: Internal Medicine

## 2020-12-05 VITALS — BP 110/50 | HR 73 | Ht 59.0 in | Wt 158.0 lb

## 2020-12-05 DIAGNOSIS — E1122 Type 2 diabetes mellitus with diabetic chronic kidney disease: Secondary | ICD-10-CM

## 2020-12-05 DIAGNOSIS — I442 Atrioventricular block, complete: Secondary | ICD-10-CM

## 2020-12-05 DIAGNOSIS — D631 Anemia in chronic kidney disease: Secondary | ICD-10-CM

## 2020-12-05 DIAGNOSIS — N1832 Chronic kidney disease, stage 3b: Secondary | ICD-10-CM | POA: Diagnosis not present

## 2020-12-05 DIAGNOSIS — I350 Nonrheumatic aortic (valve) stenosis: Secondary | ICD-10-CM

## 2020-12-05 DIAGNOSIS — N183 Chronic kidney disease, stage 3 unspecified: Secondary | ICD-10-CM

## 2020-12-05 DIAGNOSIS — N289 Disorder of kidney and ureter, unspecified: Secondary | ICD-10-CM

## 2020-12-05 LAB — PACEMAKER DEVICE OBSERVATION

## 2020-12-05 NOTE — Progress Notes (Signed)
Patient ID: Evelyn Simmons, female   DOB: December 04, 1929, 87 y.o.   MRN: [id number]      Patient Care Team: Dionicia Abler, NP as PCP - General Deboraha Sprang, MD as PCP - Cardiology (Cardiology)   HPI  Evelyn Simmons is a 87 y.o. female seen in followup for  pacemaker implanted 2014  for sick sinus syndrome. Corporate investment banker).  Developed persistent atrial fibrillation prompting 2 hospitalizations for acute on chronic heart failure related to rapid rates.  With history of recurrent transfusion requiring GI bleeding, not felt to be a candidate for anticoagulation.  Underwent AV junction ablation 11/21    Coronary artery disease with stenting in 1995. catheterization 12/11-- high-grade stenosis of her RCA >>stented.     Previously on oxygen at home but was not able to manipulate life without symptoms no longer using it.  Dyspnea with exertion of less than 30 feet.  She is euvolemic and she feels that with lower salt and fluid intake her dyspnea has been better.  No chest pain.  Stable 3 pillow orthopnea.  Uses a Rolator to assist with walking         Date Cr K TSH LFTs Hgb  3/16  1.13 4.5 2.75 12   2/17       9.3  7/19 1.1 4.0 1.45 19 10.9  9/21 1.61 3.8   8.4<7.4  11/21 2.17 3.8<<2.8     12/21 1.47              DATE TEST EF   12/11 LHC  RCA stented   12/13 TEE 45%   8/17 Echo   >55 % AS mild (mean Grad 13)  8/21 Echo  45-50% LAE-severe  AS mild  (mG-57mm)           Past Medical History:  Diagnosis Date   (HFmrEF) heart failure with mid-range ejection fraction ([place])    a. 09/2019 Echo: EF 45-50%, glob HK, mild conc LVH, mildly elev PASP. Sev dil LA. Mildly dil RA. Mild MR.   Anemia    Arthritis    Asthma    CAD (coronary artery disease)    a. 1995 s/p PCI/BMS LAD; b. 2011 s/p PCI/DES RCA.   CHF (congestive heart failure) (HCC)    Chronic kidney disease    COPD (chronic obstructive pulmonary disease) (HCC)    Depression    Diabetes mellitus without complication (HCC)     Diverticulitis    Dysrhythmia    GERD (gastroesophageal reflux disease)    Hyperlipidemia    Hypertension    Hypothyroid    pt unsure, not currently on medication   Ischemic cardiomyopathy    MI (myocardial infarction) ([place])    1995   Persistent atrial fibrillation ([place])    a. No OAC 2/2 h/o bleeding/anemia; b. Longterm amio therapy; c. 08/2019 - recurrent afib-->rate-controlled.   Presence of permanent cardiac pacemaker    Radiculopathy     Past Surgical History:  Procedure Laterality Date   ABDOMINAL HYSTERECTOMY     APPENDECTOMY     AV NODE ABLATION N/A 12/13/2019   Procedure: AV NODE ABLATION;  Surgeon: Deboraha Sprang, MD;  Location: [facility] [facility];  Service: Cardiovascular;  Laterality: N/A;   CARDIAC CATHETERIZATION     COLONOSCOPY WITH PROPOFOL N/A 10/21/2019   Procedure: COLONOSCOPY WITH PROPOFOL;  Surgeon: Lin Landsman, MD;  Location: [facility] ENDOSCOPY;  Service: Gastroenterology;  Laterality: N/A;   CORONARY ANGIOPLASTY WITH STENT PLACEMENT  12/11/1993   RCA  ESOPHAGOGASTRODUODENOSCOPY (EGD) WITH PROPOFOL N/A 10/21/2019   Procedure: ESOPHAGOGASTRODUODENOSCOPY (EGD) WITH PROPOFOL;  Surgeon: Lin Landsman, MD;  Location: [facility];  Service: Gastroenterology;  Laterality: N/A;   FOOT SURGERY Bilateral    Over 25 years ago- Bunionectomy   GIVENS CAPSULE STUDY N/A 10/22/2019   Procedure: GIVENS CAPSULE STUDY;  Surgeon: Lin Landsman, MD;  Location: [facility] END[facility]COPY;  Service: Gastroenterology;  Laterality: N/A;   INSERT / REPLACE / REMOVE PACEMAKER  09/2012   Model # S923  serial # [id number]   JOINT REPLACEMENT     bilateral knee replacements    Current Outpatient Medications  Medication Sig Dispense Refill   acetaminophen (TYLENOL) 650 MG CR tablet SMARTSIG:1 Tablet(s) By Mouth Twice Daily     aspirin EC 81 MG tablet Take 81 mg by mouth daily.     bacitracin ointment Apply to right elbow with Band-Aid 30 g 0   Blood Glucose Monitoring Suppl  (ACCU-CHEK NANO SMARTVIEW) W/DEVICE KIT Please issue nano meter by AccuChek along with test strips at checking frequency of 2x daily. 90 day supply. E11.9 1 kit 1   carboxymethylcellul-glycerin (OPTIVE) 0.5-0.9 % ophthalmic solution Place 1 drop into both eyes in the morning, at noon, and at bedtime.     cholecalciferol (VITAMIN D) 1000 units tablet Take 1,000 Units by mouth daily.     diclofenac Sodium (VOLTAREN) 1 % GEL Apply 1 application topically 4 (four) times daily as needed (left knee pain.).      DULoxetine (CYMBALTA) 30 MG capsule Take 30 mg by mouth daily.     FEROSUL 325 (65 Fe) MG tablet Take by mouth.     furosemide (LASIX) 20 MG tablet Take 20 mg by mouth daily as needed.     glucose blood (FREESTYLE LITE) test strip Use as instructed 200 each 3   Lancets (FREESTYLE) lancets Use as instructed 200 each 3   lidocaine (LIDODERM) 5 % Place 1 patch onto the skin daily as needed (knee pain.). Remove & Discard patch within 12 hours or as directed by MD     loratadine (CLARITIN) 10 MG tablet Take 10 mg by mouth daily.     metolazone (ZAROXOLYN) 2.5 MG tablet 2.5 mg every Monday, Wednesday, and Friday.     MIRALAX 17 GM/SCOOP powder SMARTSIG:17 Gram(s) By Mouth Daily PRN     Multiple Vitamins-Minerals (PRESERVISION AREDS 2+MULTI VIT) CAPS Take 1 capsule by mouth 2 (two) times daily.     naloxone (NARCAN) nasal spray 4 mg/0.1 mL Place 1 spray into the nose See admin instructions. Administer 1 spray in one nostril. Repeat every 3 minutes if no response.     nitroGLYCERIN (NITROSTAT) 0.4 MG SL tablet Place 0.4 mg under the tongue every 5 (five) minutes x 3 doses as needed for chest pain.      nystatin (MYCOSTATIN/NYSTOP) powder Apply 1 application topically 4 (four) times daily as needed (fungal infection).     nystatin-triamcinolone (MYCOLOG II) cream Apply 1 application topically 3 (three) times daily as needed (affected area(s)).     pantoprazole (PROTONIX) 20 MG tablet Take 20 mg by mouth  daily.     polyethylene glycol (MIRALAX / GLYCOLAX) 17 g packet Take 17 g by mouth daily as needed (constipation.).      potassium chloride (KLOR-CON) 20 MEQ packet Take 20 mEq by mouth daily.     QUEtiapine (SEROQUEL) 25 MG tablet Take 25 mg by mouth at bedtime.      rosuvastatin (CRESTOR) 40 MG tablet  Take 1 tablet (40 mg total) by mouth daily. 90 tablet 3   Sennosides-Docusate Sodium (SENEXON-S PO) Take 2 tablets by mouth daily as needed (constipation.).      sodium chloride (OCEAN) 0.65 % nasal spray Place 1 spray into the nose 4 (four) times daily as needed for congestion (nasal dryness).     torsemide (DEMADEX) 20 MG tablet Take 2 tablets (40 mg) by mouth once daily     traZODone (DESYREL) 100 MG tablet Take 100 mg by mouth at bedtime.     vitamin B-12 1000 MCG tablet Take 1 tablet (1,000 mcg total) by mouth daily. 30 tablet 1   No current facility-administered medications for this visit.    Allergies  Allergen Reactions   Penicillins Nausea And Vomiting, Rash and Hives   Other Other (See Comments)   Sulfa Antibiotics     Pt unsure reaction, was told in hospital she had allergy   Penicillin G Rash    Review of Systems negative except from HPI and PMH  Physical Exam BP (!) 110/50 (BP Location: Left Arm, Patient Position: Sitting, Cuff Size: Large)   Pulse 73   Ht _0  (1.499 m)   Wt 158 lb (71.7 kg)   SpO2 95%   BMI 31.91 kg/m  Well developed and well nourished in no acute distress HENT normal Neck supple with JVP-flat Clear Device pocket well healed; without hematoma or erythema.  There is no tethering  Regular rate and rhythm, no   gallop No / murmur Abd-soft with active BS No Clubbing cyanosis   edema Skin-warm and dry A & Oriented  Grossly normal sensory and motor function  ECG afib Vpacing 73      Assessment and  Plan  Complete heart block s/p AV ablation  Pacemaker-[place]  Atrial fibrillation-persistent>> permanent   Hypertension   Aortic  stenosis  Ischemic heart disease with prior stenting  HFpEF  GI bleeding precluding anticoagulation   Anemia  Renal insufficiency acute/chronic  VTNS    Functional status is really still quite limited.  She is euvolemic.  We will continue her on torsemide 40 mg daily and metolazone 2.5  3 times a week  She has been anemic.  We will check her hemoglobin.  Also need to follow her renal function in the setting of her diuresis.  With ischemic heart disease, we will continue aspirin 81 and rosuvastatin 40.  Will reassess aortic stenosis with an echo

## 2020-12-05 NOTE — Patient Instructions (Addendum)
Medication Instructions:   Your physician recommends that you continue on your current medications as directed. Please refer to the Current Medication list given to you today.  *If you need a refill on your cardiac medications before your next appointment, please call your pharmacy*   Lab Work:  Today: BMET, Hgb  Testing/Procedures:  Your physician has requested that you have an echocardiogram. Echocardiography is a painless test that uses sound waves to create images of your heart. It provides your doctor with information about the size and shape of your heart and how well your heart's chambers and valves are working. This procedure takes approximately one hour. There are no restrictions for this procedure.   Follow-Up: At [facility], you and your health needs are our priority.  As part of our continuing mission to provide you with exceptional heart care, we have created designated Provider Care Teams.  These Care Teams include your primary Cardiologist (physician) and Advanced Practice Providers (APPs -  Physician Assistants and Nurse Practitioners) who all work together to provide you with the care you need, when you need it.  We recommend signing up for the patient portal called "MyChart".  Sign up information is provided on this After Visit Summary.  MyChart is used to connect with patients for Virtual Visits (Telemedicine).  Patients are able to view lab/test results, encounter notes, upcoming appointments, etc.  Non-urgent messages can be sent to your provider as well.   To learn more about what you can do with MyChart, go to [email or web address].    Your next appointment:   6 month(s)  The format for your next appointment:   In Person  Provider:   You may see Virl Axe, MD

## 2020-12-06 LAB — BASIC METABOLIC PANEL
BUN/Creatinine Ratio: 19 (ref 12–28)
BUN: 41 mg/dL — ABNORMAL HIGH (ref 8–27)
CO2: 23 mmol/L (ref 20–29)
Calcium: 10.2 mg/dL (ref 8.7–10.3)
Chloride: 93 mmol/L — ABNORMAL LOW (ref 96–106)
Creatinine, Ser: 2.19 mg/dL — ABNORMAL HIGH (ref 0.57–1.00)
Glucose: 235 mg/dL — ABNORMAL HIGH (ref 70–99)
Potassium: 2.6 mmol/L — ABNORMAL LOW (ref 3.5–5.2)
Sodium: 139 mmol/L (ref 134–144)
eGFR: 21 mL/min/{1.73_m2} — ABNORMAL LOW (ref >59)

## 2020-12-06 LAB — HEMOGLOBIN: Hemoglobin: 12.2 g/dL (ref 11.1–15.9)

## 2020-12-07 ENCOUNTER — Other Ambulatory Visit: Payer: Medicare (Managed Care)

## 2020-12-07 ENCOUNTER — Telehealth: Payer: Self-pay | Admitting: Internal Medicine

## 2020-12-07 ENCOUNTER — Encounter: Payer: Self-pay | Admitting: *Deleted

## 2020-12-07 DIAGNOSIS — N289 Disorder of kidney and ureter, unspecified: Secondary | ICD-10-CM

## 2020-12-07 DIAGNOSIS — E876 Hypokalemia: Secondary | ICD-10-CM

## 2020-12-07 NOTE — Telephone Encounter (Signed)
I spoke with the patient and advised her of her lab results and Dr. Olin Pia recommendations to: 1) HOLD metolazone for the rest of the week, which she typically take on Mondays/ Wednesdays/ Fridays - she confirms her pills are pre-packaged and she has not taken her medications this morning  2) INCREASE potassium from 20 meq QD to 40 mg BID x 3 days  3) REPEAT a BMP on Monday  Per the patient, her medications are pre-packaged and managed through [facility]. I asked her to open her medications and try to identify her pills, but she was unable to recognize these tablets. I have advised her I will call [facility] to see if they can assist her with the changes as directed by Dr. Caryl Comes.  The patient advised they will have a nurse come out to see her.   I have asked the patient to hold on taking her medications this morning and I will call her back after speaking with the staff at [facility]. The patient voices understanding and is agreeable.

## 2020-12-07 NOTE — Telephone Encounter (Signed)
Call received from Dionicia Abler, NP at [facility] clinic. She advised they received our fax and had followed up with the patient. They confirmed she had not been taking her potassium, but will supplement her as directed. They have removed her metolazone and will follow up with a BMP on Monday. Per [place], if the K+ level comes back up, she will resume potassium at 20 meq once daily, but she will forward the labs to [place] when available.  Dr. Caryl Comes notified the patient was not taking her potassium as prescribed.

## 2020-12-07 NOTE — Telephone Encounter (Signed)
Fax confirmation received for [phone].

## 2020-12-07 NOTE — Telephone Encounter (Signed)
I spoke with the clinical staff at [facility]. I have advised them of abnormal lab results from 12/05/20 and the need for medication changes and a repeat lab draw that needs to be done.  Per staff, fax MD orders to: [phone]  Orders faxed and marked as URGENT  Copy of lab work and lab order also faxed.

## 2020-12-07 NOTE — Telephone Encounter (Signed)
Evelyn Filbert, RN  12/07/2020  9:43 AM EDT     V.O received from Dr. Caryl Comes to: - HOLD metolazone for the rest of the week - INCREASE her potassium from 20 mEq daily to 40 mEq twice daily x3 days  - and then recheck a metabolic profile on Monday

## 2020-12-07 NOTE — Telephone Encounter (Signed)
I called and spoke with the patient. I advised her when I originally spoke with her I was thinking today was Wednesday and I was afraid that she would take her metolazone. I advised her that since it is Thursday to go ahead and take her regular medications for this morning.  She is aware I have faxed her orders for her medication changes and repeat lab work to be done at the beginning of next week to [facility] clinic.  I advised her that I asked they call back to confirm receipt of this message, but have not heard anything yet.  The patient advised she will call [facility] to follow up as well.  She is aware I will call her back after we receive the results of her repeat BMP next week.  The patient voices understanding and is agreeable.

## 2020-12-12 ENCOUNTER — Other Ambulatory Visit: Payer: Self-pay

## 2020-12-12 ENCOUNTER — Ambulatory Visit (INDEPENDENT_AMBULATORY_CARE_PROVIDER_SITE_OTHER): Payer: Medicare (Managed Care)

## 2020-12-12 DIAGNOSIS — I350 Nonrheumatic aortic (valve) stenosis: Secondary | ICD-10-CM

## 2020-12-12 LAB — ECHOCARDIOGRAM COMPLETE
AR max vel: 1.17 cm2
AV Area VTI: 1.17 cm2
AV Area mean vel: 1.03 cm2
AV Mean grad: 11.2 mmHg
AV Peak grad: 17 mmHg
Ao pk vel: 2.06 m/s
Area-P 1/2: 3.4 cm2
Calc EF: 59.9 %
MV M vel: 4.76 m/s
MV Peak grad: 90.6 mmHg
MV VTI: 1.42 cm2
S' Lateral: 3.3 cm
Single Plane A2C EF: 56.3 %
Single Plane A4C EF: 64.7 %

## 2020-12-15 ENCOUNTER — Encounter: Payer: Self-pay | Admitting: Internal Medicine

## 2020-12-15 NOTE — Telephone Encounter (Signed)
Fax received from [facility] clinic today with a repeat CMET done on 12/14/20.  K+ -4.5 BUN- 35 Creatinine- 1.8 Sodium- 136  Patient being followed by Dionicia Abler, NP at [facility] clinic.   To Dr. Caryl Comes as an Juluis Rainier.

## 2021-04-26 ENCOUNTER — Other Ambulatory Visit: Payer: Self-pay

## 2021-04-26 ENCOUNTER — Emergency Department: Payer: Medicare (Managed Care)

## 2021-04-26 ENCOUNTER — Emergency Department
Admission: EM | Admit: 2021-04-26 | Discharge: 2021-04-26 | Disposition: A | Discharge status: Home / Self Care | Payer: Medicare (Managed Care) | Attending: Student in an Organized Health Care Education/Training Program | Admitting: Student in an Organized Health Care Education/Training Program

## 2021-04-26 DIAGNOSIS — R519 Headache, unspecified: Secondary | ICD-10-CM | POA: Diagnosis not present

## 2021-04-26 DIAGNOSIS — R748 Abnormal levels of other serum enzymes: Secondary | ICD-10-CM | POA: Diagnosis not present

## 2021-04-26 DIAGNOSIS — R0789 Other chest pain: Secondary | ICD-10-CM | POA: Insufficient documentation

## 2021-04-26 LAB — COMPREHENSIVE METABOLIC PANEL
ALT: 6 U/L (ref 0–44)
AST: 17 U/L (ref 15–41)
Albumin: 3.5 g/dL (ref 3.5–5.0)
Alkaline Phosphatase: 47 U/L (ref 38–126)
Anion gap: 10 (ref 5–15)
BUN: 33 mg/dL — ABNORMAL HIGH (ref 8–23)
CO2: 27 mmol/L (ref 22–32)
Calcium: 11.3 mg/dL — ABNORMAL HIGH (ref 8.9–10.3)
Chloride: 102 mmol/L (ref 98–111)
Creatinine, Ser: 1.25 mg/dL — ABNORMAL HIGH (ref 0.44–1.00)
GFR, Estimated: 41 mL/min — ABNORMAL LOW (ref >60)
Glucose, Bld: 150 mg/dL — ABNORMAL HIGH (ref 70–99)
Potassium: 4.9 mmol/L (ref 3.5–5.1)
Sodium: 139 mmol/L (ref 135–145)
Total Bilirubin: 0.7 mg/dL (ref 0.3–1.2)
Total Protein: 6.5 g/dL (ref 6.5–8.1)

## 2021-04-26 LAB — CBC
HCT: 38.3 % (ref 36.0–46.0)
Hemoglobin: 11.8 g/dL — ABNORMAL LOW (ref 12.0–15.0)
MCH: 31.8 pg (ref 26.0–34.0)
MCHC: 30.8 g/dL (ref 30.0–36.0)
MCV: 103.2 fL — ABNORMAL HIGH (ref 80.0–100.0)
Platelets: 264 10*3/uL (ref 150–400)
RBC: 3.71 MIL/uL — ABNORMAL LOW (ref 3.87–5.11)
RDW: 14.7 % (ref 11.5–15.5)
WBC: 6.9 10*3/uL (ref 4.0–10.5)
nRBC: 0 % (ref 0.0–0.2)

## 2021-04-26 LAB — TROPONIN I (HIGH SENSITIVITY)
Troponin I (High Sensitivity): 27 ng/L — ABNORMAL HIGH (ref <18)
Troponin I (High Sensitivity): 28 ng/L — ABNORMAL HIGH (ref <18)

## 2021-04-26 MED ORDER — ACETAMINOPHEN 325 MG PO TABS
650.0000 mg | ORAL_TABLET | Freq: Once | ORAL | Status: AC
  Administered 2021-04-26: 650 mg via ORAL
  Filled 2021-04-26: qty 2

## 2021-04-26 MED ORDER — FUROSEMIDE 10 MG/ML IJ SOLN
40.0000 mg | Freq: Once | INTRAMUSCULAR | Status: AC
  Administered 2021-04-26: 40 mg via INTRAVENOUS
  Filled 2021-04-26: qty 4

## 2021-04-26 NOTE — ED Notes (Signed)
Trops collected.  ?

## 2021-04-26 NOTE — ED Notes (Signed)
Called [facility] for transport to [place] ?

## 2021-04-26 NOTE — ED Triage Notes (Signed)
C/o sudden, midline back pain at 1200 today that then radiated to front/chest, and intermittently down right arm. Pt. States feels different than chronic upper back pain. 324 ASA en route via medic. Pt. Has pacemaker. ?

## 2021-04-26 NOTE — ED Notes (Signed)
Pt. Refused to keep blood pressure cuff on, states PCT needs to take it off, or she (the pt. Will) ? ?

## 2021-04-26 NOTE — ED Notes (Addendum)
Pt yelling for help. When this entered the room pt was yelling due to b/p cuff getting too tight. After b/p was taken pt requested to be taken off, "if you don't take it off I will do it myself. " ?

## 2021-04-26 NOTE — ED Notes (Signed)
ED Provider at bedside. 

## 2021-04-26 NOTE — ED Notes (Signed)
Pt. Updated on POC, this RN explained to pt. The need to keep cardiac monitoring on, as pt. Was complaining about her wires and wanting to remove them. ?

## 2021-04-26 NOTE — ED Notes (Signed)
[place] to let them know pt. Was being discharged. No answer. Will attempt again before pt. Leaves. ?

## 2021-04-26 NOTE — ED Provider Notes (Signed)
? ?[facility] ?Provider Note ? ? ? Event Date/Time  ? First MD Initiated Contact with Patient 04/26/21 1328   ?  (approximate) ? ? ?History  ? ?Chest Pain (C/o sudden, midline back pain at 1200 today that then radiated to front/chest, and intermittently down right arm. Pt. States feels different than chronic upper back pain. 324 ASA en route via medic. Pt. Has pacemaker.) ? ? ?HPI ? ?Evelyn Simmons is a 88 y.o. female with extensive past medical history presents to the ER for evaluation of chest pain and headache started around noon today.  States the pain has resolved.  Did not take anything for it.  Having mild frontal headache.  States she also has chronic pain in her upper back this feels somewhat different.  No nausea or vomiting no belching. ?  ? ? ?Physical Exam  ? ?Triage Vital Signs: ?ED Triage Vitals  ?Enc Vitals Group  ?   BP 04/26/21 1353 127/76  ?   Pulse Rate 04/26/21 1353 70  ?   Resp 04/26/21 1353 (!) 25  ?   Temp --   ?   Temp src --   ?   SpO2 04/26/21 1353 98 %  ?   Weight 04/26/21 1336 156 lb 1.4 oz (70.8 kg)  ?   Height 04/26/21 1336 4\' 11"  (1.499 m)  ?   Head Circumference --   ?   Peak Flow --   ?   Pain Score 04/26/21 1335 0  ?   Pain Loc --   ?   Pain Edu? --   ?   Excl. in [place]? --   ? ? ?Most recent vital signs: ?Vitals:  ? 04/26/21 1500 04/26/21 1515  ?BP:    ?Pulse: 70 69  ?Resp: 18 19  ?SpO2: 94% 93%  ? ? ? ?Constitutional: Alert  ?Eyes: Conjunctivae are normal.  ?Head: Atraumatic. ?Nose: No congestion/rhinnorhea. ?Mouth/Throat: Mucous membranes are moist.   ?Neck: Painless ROM.  ?Cardiovascular:   Good peripheral circulation. ?Respiratory: Normal respiratory effort.  No retractions. Coarse bibasilar breathsounds ?Gastrointestinal: Soft and nontender.  ?Musculoskeletal:  no deformity ?Neurologic:  MAE spontaneously. No gross focal neurologic deficits are appreciated.  ?Skin:  Skin is warm, dry and intact. No rash noted. ?Psychiatric: Mood and affect are normal.  Speech and behavior are normal. ? ? ? ?ED Results / Procedures / Treatments  ? ?Labs ?(all labs ordered are listed, but only abnormal results are displayed) ?Labs Reviewed  ?CBC - Abnormal; Notable for the following components:  ?    Result Value  ? RBC 3.71 (*)   ? Hemoglobin 11.8 (*)   ? MCV 103.2 (*)   ? All other components within normal limits  ?COMPREHENSIVE METABOLIC PANEL - Abnormal; Notable for the following components:  ? Glucose, Bld 150 (*)   ? BUN 33 (*)   ? Creatinine, Ser 1.25 (*)   ? Calcium 11.3 (*)   ? GFR, Estimated 41 (*)   ? All other components within normal limits  ?TROPONIN I (HIGH SENSITIVITY) - Abnormal; Notable for the following components:  ? Troponin I (High Sensitivity) 27 (*)   ? All other components within normal limits  ?TROPONIN I (HIGH SENSITIVITY)  ? ? ? ?EKG ? ?ED ECG REPORT ?I, Merlyn Lot, the attending physician, personally viewed and interpreted this ECG. ? ? Date: 04/26/2021 ? EKG Time: 13:29 ? Rate: 70 ? Rhythm: v-paced ? Axis: left ? Intervals:paced ? ST&T Change: paced rhythm, no  stemi ? ? ? ?RADIOLOGY ?Please see ED Course for my review and interpretation. ? ?I personally reviewed all radiographic images ordered to evaluate for the above acute complaints and reviewed radiology reports and findings.  These findings were personally discussed with the patient.  Please see medical record for radiology report. ? ? ? ?PROCEDURES: ? ?Critical Care performed: no ? ?Procedures ? ? ?MEDICATIONS ORDERED IN ED: ?Medications  ?furosemide (LASIX) injection 40 mg (has no administration in time range)  ? ? ? ?IMPRESSION / MDM / ASSESSMENT AND PLAN / ED COURSE  ?I reviewed the triage vital signs and the nursing notes. ?             ?               ? ?Differential diagnosis includes, but is not limited to, ACS, pericarditis, esophagitis, boerhaaves, pe, dissection, pna, bronchitis, costochondritis ? ?Patient presenting with symptoms as described above.  She is afebrile  hemodynamically stable denies any significant shortness of breath.  EKG is paced rhythm no Sgarbossa criteria initial troponin mildly elevated 20s.  She is not having any active chest pain at this time.  Chest x-ray on my review and interpretation does show a mild cardiomegaly no pneumothorax.  Per radiology report there is evidence of vascular congestion therefore will give IV Lasix. ? ? ?Clinical Course as of 04/26/21 1547  ?Thu Apr 26, 2021  ?[id number] Patient reassessed denies any chest pain at this time.  CT head is reassuring.  Complain of mild headache.  Not on code will give Tylenol.  Patient be signed out to oncoming physician pending repeat troponin. [PR]  ?  ?Clinical Course User Index ?[PR] Merlyn Lot, MD  ? ? ? ?FINAL CLINICAL IMPRESSION(S) / ED DIAGNOSES  ? ?Final diagnoses:  ?Atypical chest pain  ?Acute nonintractable headache, unspecified headache type  ? ? ? ?Rx / DC Orders  ? ?ED Discharge Orders   ? ? None  ? ?  ? ? ? ?Note:  This document was prepared using Dragon voice recognition software and may include unintentional dictation errors. ? ?  ?Merlyn Lot, MD ?04/26/21 1547 ? ?

## 2021-07-10 ENCOUNTER — Encounter: Payer: Self-pay | Admitting: Internal Medicine

## 2021-10-11 ENCOUNTER — Inpatient Hospital Stay (HOSPITAL_COMMUNITY)
Admission: EM | Admit: 2021-10-11 | Discharge: 2021-10-18 | DRG: 853 | Disposition: A | Discharge status: Skilled Nursing Facility | Payer: Medicare (Managed Care) | Attending: Internal Medicine | Admitting: Internal Medicine

## 2021-10-11 ENCOUNTER — Emergency Department (HOSPITAL_COMMUNITY): Payer: Medicare (Managed Care)

## 2021-10-11 DIAGNOSIS — E039 Hypothyroidism, unspecified: Secondary | ICD-10-CM | POA: Diagnosis present

## 2021-10-11 DIAGNOSIS — Y92000 Kitchen of unspecified non-institutional (private) residence as  the place of occurrence of the external cause: Secondary | ICD-10-CM

## 2021-10-11 DIAGNOSIS — J9611 Chronic respiratory failure with hypoxia: Secondary | ICD-10-CM | POA: Diagnosis present

## 2021-10-11 DIAGNOSIS — Z8719 Personal history of other diseases of the digestive system: Secondary | ICD-10-CM

## 2021-10-11 DIAGNOSIS — E861 Hypovolemia: Secondary | ICD-10-CM | POA: Diagnosis present

## 2021-10-11 DIAGNOSIS — I442 Atrioventricular block, complete: Secondary | ICD-10-CM | POA: Diagnosis present

## 2021-10-11 DIAGNOSIS — Z79899 Other long term (current) drug therapy: Secondary | ICD-10-CM

## 2021-10-11 DIAGNOSIS — Z90710 Acquired absence of both cervix and uterus: Secondary | ICD-10-CM

## 2021-10-11 DIAGNOSIS — A419 Sepsis, unspecified organism: Principal | ICD-10-CM | POA: Diagnosis present

## 2021-10-11 DIAGNOSIS — R55 Syncope and collapse: Secondary | ICD-10-CM | POA: Diagnosis not present

## 2021-10-11 DIAGNOSIS — Z882 Allergy status to sulfonamides status: Secondary | ICD-10-CM

## 2021-10-11 DIAGNOSIS — J449 Chronic obstructive pulmonary disease, unspecified: Secondary | ICD-10-CM | POA: Diagnosis present

## 2021-10-11 DIAGNOSIS — K5900 Constipation, unspecified: Secondary | ICD-10-CM | POA: Diagnosis present

## 2021-10-11 DIAGNOSIS — I4892 Unspecified atrial flutter: Secondary | ICD-10-CM | POA: Diagnosis present

## 2021-10-11 DIAGNOSIS — K219 Gastro-esophageal reflux disease without esophagitis: Secondary | ICD-10-CM | POA: Diagnosis present

## 2021-10-11 DIAGNOSIS — F05 Delirium due to known physiological condition: Secondary | ICD-10-CM | POA: Diagnosis not present

## 2021-10-11 DIAGNOSIS — I130 Hypertensive heart and chronic kidney disease with heart failure and stage 1 through stage 4 chronic kidney disease, or unspecified chronic kidney disease: Secondary | ICD-10-CM | POA: Diagnosis present

## 2021-10-11 DIAGNOSIS — Z955 Presence of coronary angioplasty implant and graft: Secondary | ICD-10-CM

## 2021-10-11 DIAGNOSIS — F0390 Unspecified dementia without behavioral disturbance: Secondary | ICD-10-CM | POA: Diagnosis present

## 2021-10-11 DIAGNOSIS — Z8261 Family history of arthritis: Secondary | ICD-10-CM

## 2021-10-11 DIAGNOSIS — N1832 Chronic kidney disease, stage 3b: Secondary | ICD-10-CM | POA: Diagnosis present

## 2021-10-11 DIAGNOSIS — Z8249 Family history of ischemic heart disease and other diseases of the circulatory system: Secondary | ICD-10-CM

## 2021-10-11 DIAGNOSIS — I495 Sick sinus syndrome: Secondary | ICD-10-CM | POA: Diagnosis present

## 2021-10-11 DIAGNOSIS — I255 Ischemic cardiomyopathy: Secondary | ICD-10-CM | POA: Diagnosis present

## 2021-10-11 DIAGNOSIS — I4821 Permanent atrial fibrillation: Secondary | ICD-10-CM | POA: Diagnosis present

## 2021-10-11 DIAGNOSIS — I4589 Other specified conduction disorders: Secondary | ICD-10-CM | POA: Diagnosis present

## 2021-10-11 DIAGNOSIS — I959 Hypotension, unspecified: Secondary | ICD-10-CM

## 2021-10-11 DIAGNOSIS — I5023 Acute on chronic systolic (congestive) heart failure: Secondary | ICD-10-CM | POA: Diagnosis present

## 2021-10-11 DIAGNOSIS — I2510 Atherosclerotic heart disease of native coronary artery without angina pectoris: Secondary | ICD-10-CM | POA: Diagnosis present

## 2021-10-11 DIAGNOSIS — N3000 Acute cystitis without hematuria: Secondary | ICD-10-CM | POA: Diagnosis present

## 2021-10-11 DIAGNOSIS — M47812 Spondylosis without myelopathy or radiculopathy, cervical region: Secondary | ICD-10-CM | POA: Diagnosis present

## 2021-10-11 DIAGNOSIS — Z833 Family history of diabetes mellitus: Secondary | ICD-10-CM

## 2021-10-11 DIAGNOSIS — Z7982 Long term (current) use of aspirin: Secondary | ICD-10-CM

## 2021-10-11 DIAGNOSIS — I252 Old myocardial infarction: Secondary | ICD-10-CM

## 2021-10-11 DIAGNOSIS — Z96653 Presence of artificial knee joint, bilateral: Secondary | ICD-10-CM | POA: Diagnosis present

## 2021-10-11 DIAGNOSIS — Z880 Allergy status to penicillin: Secondary | ICD-10-CM

## 2021-10-11 DIAGNOSIS — D631 Anemia in chronic kidney disease: Secondary | ICD-10-CM | POA: Diagnosis present

## 2021-10-11 DIAGNOSIS — E1122 Type 2 diabetes mellitus with diabetic chronic kidney disease: Secondary | ICD-10-CM | POA: Diagnosis present

## 2021-10-11 DIAGNOSIS — E876 Hypokalemia: Secondary | ICD-10-CM | POA: Diagnosis present

## 2021-10-11 DIAGNOSIS — E785 Hyperlipidemia, unspecified: Secondary | ICD-10-CM | POA: Diagnosis present

## 2021-10-11 DIAGNOSIS — N3090 Cystitis, unspecified without hematuria: Secondary | ICD-10-CM

## 2021-10-11 DIAGNOSIS — R6521 Severe sepsis with septic shock: Secondary | ICD-10-CM | POA: Diagnosis present

## 2021-10-11 DIAGNOSIS — W19XXXA Unspecified fall, initial encounter: Secondary | ICD-10-CM | POA: Diagnosis present

## 2021-10-11 DIAGNOSIS — Z87891 Personal history of nicotine dependence: Secondary | ICD-10-CM

## 2021-10-11 DIAGNOSIS — M1990 Unspecified osteoarthritis, unspecified site: Secondary | ICD-10-CM | POA: Diagnosis present

## 2021-10-11 DIAGNOSIS — N179 Acute kidney failure, unspecified: Secondary | ICD-10-CM | POA: Diagnosis present

## 2021-10-11 DIAGNOSIS — D509 Iron deficiency anemia, unspecified: Secondary | ICD-10-CM | POA: Diagnosis present

## 2021-10-11 DIAGNOSIS — Z950 Presence of cardiac pacemaker: Secondary | ICD-10-CM

## 2021-10-11 LAB — CBC WITH DIFFERENTIAL/PLATELET
Abs Immature Granulocytes: 0.08 10*3/uL — ABNORMAL HIGH (ref 0.00–0.07)
Basophils Absolute: 0 10*3/uL (ref 0.0–0.1)
Basophils Relative: 0 %
Eosinophils Absolute: 0.1 10*3/uL (ref 0.0–0.5)
Eosinophils Relative: 1 %
HCT: 31.1 % — ABNORMAL LOW (ref 36.0–46.0)
Hemoglobin: 9.8 g/dL — ABNORMAL LOW (ref 12.0–15.0)
Immature Granulocytes: 1 %
Lymphocytes Relative: 8 %
Lymphs Abs: 0.8 10*3/uL (ref 0.7–4.0)
MCH: 31.7 pg (ref 26.0–34.0)
MCHC: 31.5 g/dL (ref 30.0–36.0)
MCV: 100.6 fL — ABNORMAL HIGH (ref 80.0–100.0)
Monocytes Absolute: 0.9 10*3/uL (ref 0.1–1.0)
Monocytes Relative: 8 %
Neutro Abs: 9 10*3/uL — ABNORMAL HIGH (ref 1.7–7.7)
Neutrophils Relative %: 82 %
Platelets: 267 10*3/uL (ref 150–400)
RBC: 3.09 MIL/uL — ABNORMAL LOW (ref 3.87–5.11)
RDW: 15.1 % (ref 11.5–15.5)
WBC: 10.9 10*3/uL — ABNORMAL HIGH (ref 4.0–10.5)
nRBC: 0 % (ref 0.0–0.2)

## 2021-10-11 LAB — BASIC METABOLIC PANEL
Anion gap: 19 — ABNORMAL HIGH (ref 5–15)
BUN: 60 mg/dL — ABNORMAL HIGH (ref 8–23)
CO2: 21 mmol/L — ABNORMAL LOW (ref 22–32)
Calcium: 10.2 mg/dL (ref 8.9–10.3)
Chloride: 102 mmol/L (ref 98–111)
Creatinine, Ser: 1.9 mg/dL — ABNORMAL HIGH (ref 0.44–1.00)
GFR, Estimated: 25 mL/min — ABNORMAL LOW (ref >60)
Glucose, Bld: 204 mg/dL — ABNORMAL HIGH (ref 70–99)
Potassium: 2.6 mmol/L — CL (ref 3.5–5.1)
Sodium: 142 mmol/L (ref 135–145)

## 2021-10-11 LAB — PROTIME-INR
INR: 1.2 (ref 0.8–1.2)
Prothrombin Time: 15.5 seconds — ABNORMAL HIGH (ref 11.4–15.2)

## 2021-10-11 LAB — TROPONIN I (HIGH SENSITIVITY): Troponin I (High Sensitivity): 153 ng/L (ref <18)

## 2021-10-11 MED ORDER — POTASSIUM CHLORIDE 20 MEQ PO PACK
40.0000 meq | PACK | Freq: Two times a day (BID) | ORAL | Status: DC
  Administered 2021-10-12 (×2): 40 meq via ORAL
  Filled 2021-10-11 (×2): qty 2

## 2021-10-11 MED ORDER — SODIUM CHLORIDE 0.9 % IV SOLN
250.0000 mL | INTRAVENOUS | Status: DC
  Administered 2021-10-12 – 2021-10-15 (×2): 250 mL via INTRAVENOUS

## 2021-10-11 MED ORDER — NOREPINEPHRINE 4 MG/250ML-% IV SOLN
2.0000 ug/min | INTRAVENOUS | Status: DC
  Filled 2021-10-11: qty 250

## 2021-10-11 MED ORDER — MAGNESIUM SULFATE 2 GM/50ML IV SOLN
2.0000 g | Freq: Once | INTRAVENOUS | Status: AC
  Administered 2021-10-12: 2 g via INTRAVENOUS
  Filled 2021-10-11: qty 50

## 2021-10-11 MED ORDER — LACTATED RINGERS IV BOLUS: 250.0000 mL | Freq: Once | INTRAVENOUS | Status: DC

## 2021-10-11 NOTE — ED Notes (Signed)
Unable to obtain peripheral IV needed for CTA x4 attempts. IV team consulted.

## 2021-10-11 NOTE — ED Triage Notes (Signed)
Life alert called EMS, found down on kitchen floor, initially incoherent, becoming more coherent en route. Pupils uneven. Several episodes of stating "I don't feel good", becomes brady and eyes roll back in head for several seconds. Also several episodes of combative behavior. Emesis x3 with EMS.   Unknown blood thinner status, asa and lasix only meds found in house. No obvious head trauma but wood floor.  BS 218, 105/70, paced rhythm in 50s. GCS 14.4mg  zofran with EMS.

## 2021-10-11 NOTE — ED Notes (Signed)
EDP notified of low BP, will complete bolus and reassess. Pt mentating at same level as presentation.

## 2021-10-11 NOTE — ED Notes (Signed)
CRITICAL VALUE STICKER  CRITICAL VALUE: troponin 153, potassium 2.6  RECEIVER (on-site recipient of call):Terrance Lanahan  DATE & TIME NOTIFIED: 8/31 2310 MD NOTIFIED: Dr. Truett Mainland  TIME OF NOTIFICATION:2313  RESPONSE: repeat ekg, see chart

## 2021-10-11 NOTE — Progress Notes (Signed)
Orthopedic Tech Progress Note Patient Details:  Evelyn Simmons July 30, 1929 [id number]  Patient ID: Evelyn Simmons, female   DOB: 1929-12-14, 88 y.o.   MRN: [id number] Level 2 trauma not needed. Delainee Tramel L Traven Davids 10/11/2021, 10:08 PM

## 2021-10-12 ENCOUNTER — Inpatient Hospital Stay (HOSPITAL_COMMUNITY): Payer: Medicare (Managed Care)

## 2021-10-12 DIAGNOSIS — A419 Sepsis, unspecified organism: Secondary | ICD-10-CM

## 2021-10-12 DIAGNOSIS — N3090 Cystitis, unspecified without hematuria: Secondary | ICD-10-CM

## 2021-10-12 DIAGNOSIS — I2510 Atherosclerotic heart disease of native coronary artery without angina pectoris: Secondary | ICD-10-CM | POA: Diagnosis present

## 2021-10-12 DIAGNOSIS — E861 Hypovolemia: Secondary | ICD-10-CM | POA: Diagnosis present

## 2021-10-12 DIAGNOSIS — N3000 Acute cystitis without hematuria: Secondary | ICD-10-CM

## 2021-10-12 DIAGNOSIS — R6521 Severe sepsis with septic shock: Secondary | ICD-10-CM

## 2021-10-12 DIAGNOSIS — I959 Hypotension, unspecified: Secondary | ICD-10-CM | POA: Diagnosis present

## 2021-10-12 DIAGNOSIS — E876 Hypokalemia: Secondary | ICD-10-CM | POA: Diagnosis present

## 2021-10-12 DIAGNOSIS — R55 Syncope and collapse: Secondary | ICD-10-CM | POA: Diagnosis present

## 2021-10-12 DIAGNOSIS — N179 Acute kidney failure, unspecified: Secondary | ICD-10-CM | POA: Diagnosis present

## 2021-10-12 DIAGNOSIS — I130 Hypertensive heart and chronic kidney disease with heart failure and stage 1 through stage 4 chronic kidney disease, or unspecified chronic kidney disease: Secondary | ICD-10-CM | POA: Diagnosis present

## 2021-10-12 DIAGNOSIS — F05 Delirium due to known physiological condition: Secondary | ICD-10-CM | POA: Diagnosis not present

## 2021-10-12 DIAGNOSIS — D631 Anemia in chronic kidney disease: Secondary | ICD-10-CM | POA: Diagnosis present

## 2021-10-12 DIAGNOSIS — I442 Atrioventricular block, complete: Secondary | ICD-10-CM | POA: Diagnosis present

## 2021-10-12 DIAGNOSIS — E785 Hyperlipidemia, unspecified: Secondary | ICD-10-CM | POA: Diagnosis present

## 2021-10-12 DIAGNOSIS — K219 Gastro-esophageal reflux disease without esophagitis: Secondary | ICD-10-CM | POA: Diagnosis not present

## 2021-10-12 DIAGNOSIS — I340 Nonrheumatic mitral (valve) insufficiency: Secondary | ICD-10-CM | POA: Diagnosis not present

## 2021-10-12 DIAGNOSIS — Y92000 Kitchen of unspecified non-institutional (private) residence as  the place of occurrence of the external cause: Secondary | ICD-10-CM | POA: Diagnosis not present

## 2021-10-12 DIAGNOSIS — I4811 Longstanding persistent atrial fibrillation: Secondary | ICD-10-CM | POA: Diagnosis not present

## 2021-10-12 DIAGNOSIS — E039 Hypothyroidism, unspecified: Secondary | ICD-10-CM | POA: Diagnosis present

## 2021-10-12 DIAGNOSIS — D509 Iron deficiency anemia, unspecified: Secondary | ICD-10-CM | POA: Diagnosis present

## 2021-10-12 DIAGNOSIS — E1122 Type 2 diabetes mellitus with diabetic chronic kidney disease: Secondary | ICD-10-CM | POA: Diagnosis present

## 2021-10-12 DIAGNOSIS — I495 Sick sinus syndrome: Secondary | ICD-10-CM | POA: Diagnosis present

## 2021-10-12 DIAGNOSIS — F0390 Unspecified dementia without behavioral disturbance: Secondary | ICD-10-CM | POA: Diagnosis present

## 2021-10-12 DIAGNOSIS — J449 Chronic obstructive pulmonary disease, unspecified: Secondary | ICD-10-CM | POA: Diagnosis present

## 2021-10-12 DIAGNOSIS — I4589 Other specified conduction disorders: Secondary | ICD-10-CM | POA: Diagnosis present

## 2021-10-12 DIAGNOSIS — J9611 Chronic respiratory failure with hypoxia: Secondary | ICD-10-CM | POA: Diagnosis present

## 2021-10-12 DIAGNOSIS — I428 Other cardiomyopathies: Secondary | ICD-10-CM | POA: Diagnosis not present

## 2021-10-12 DIAGNOSIS — I950 Idiopathic hypotension: Secondary | ICD-10-CM | POA: Diagnosis not present

## 2021-10-12 DIAGNOSIS — I4821 Permanent atrial fibrillation: Secondary | ICD-10-CM | POA: Diagnosis present

## 2021-10-12 DIAGNOSIS — N1832 Chronic kidney disease, stage 3b: Secondary | ICD-10-CM | POA: Diagnosis present

## 2021-10-12 DIAGNOSIS — W19XXXA Unspecified fall, initial encounter: Secondary | ICD-10-CM | POA: Diagnosis present

## 2021-10-12 DIAGNOSIS — I5023 Acute on chronic systolic (congestive) heart failure: Secondary | ICD-10-CM | POA: Diagnosis present

## 2021-10-12 DIAGNOSIS — I4892 Unspecified atrial flutter: Secondary | ICD-10-CM | POA: Diagnosis present

## 2021-10-12 LAB — BASIC METABOLIC PANEL
Anion gap: 11 (ref 5–15)
Anion gap: 9 (ref 5–15)
BUN: 55 mg/dL — ABNORMAL HIGH (ref 8–23)
BUN: 50 mg/dL — ABNORMAL HIGH (ref 8–23)
CO2: 26 mmol/L (ref 22–32)
CO2: 22 mmol/L (ref 22–32)
Calcium: 9.6 mg/dL (ref 8.9–10.3)
Calcium: 9.2 mg/dL (ref 8.9–10.3)
Chloride: 103 mmol/L (ref 98–111)
Chloride: 106 mmol/L (ref 98–111)
Creatinine, Ser: 1.7 mg/dL — ABNORMAL HIGH (ref 0.44–1.00)
Creatinine, Ser: 1.55 mg/dL — ABNORMAL HIGH (ref 0.44–1.00)
GFR, Estimated: 28 mL/min — ABNORMAL LOW (ref >60)
GFR, Estimated: 32 mL/min — ABNORMAL LOW (ref >60)
Glucose, Bld: 142 mg/dL — ABNORMAL HIGH (ref 70–99)
Glucose, Bld: 109 mg/dL — ABNORMAL HIGH (ref 70–99)
Potassium: 3 mmol/L — ABNORMAL LOW (ref 3.5–5.1)
Potassium: 4.4 mmol/L (ref 3.5–5.1)
Sodium: 140 mmol/L (ref 135–145)
Sodium: 137 mmol/L (ref 135–145)

## 2021-10-12 LAB — ECHOCARDIOGRAM COMPLETE
AR max vel: 1.01 cm2
AV Area VTI: 0.92 cm2
AV Area mean vel: 0.92 cm2
AV Mean grad: 8 mmHg
AV Peak grad: 15.7 mmHg
Ao pk vel: 1.98 m/s
Height: 59 in
S' Lateral: 3.4 cm
Weight: 2363.33 oz

## 2021-10-12 LAB — CBC
HCT: 26.3 % — ABNORMAL LOW (ref 36.0–46.0)
Hemoglobin: 8.6 g/dL — ABNORMAL LOW (ref 12.0–15.0)
MCH: 31.9 pg (ref 26.0–34.0)
MCHC: 32.7 g/dL (ref 30.0–36.0)
MCV: 97.4 fL (ref 80.0–100.0)
Platelets: 293 10*3/uL (ref 150–400)
RBC: 2.7 MIL/uL — ABNORMAL LOW (ref 3.87–5.11)
RDW: 15.2 % (ref 11.5–15.5)
WBC: 8.5 10*3/uL (ref 4.0–10.5)
nRBC: 0 % (ref 0.0–0.2)

## 2021-10-12 LAB — URINALYSIS, ROUTINE W REFLEX MICROSCOPIC
Bilirubin Urine: NEGATIVE
Glucose, UA: NEGATIVE mg/dL
Hgb urine dipstick: NEGATIVE
Ketones, ur: NEGATIVE mg/dL
Nitrite: NEGATIVE
Protein, ur: NEGATIVE mg/dL
Specific Gravity, Urine: 1.012 (ref 1.005–1.030)
pH: 5 (ref 5.0–8.0)

## 2021-10-12 LAB — GLUCOSE, CAPILLARY
Glucose-Capillary: 104 mg/dL — ABNORMAL HIGH (ref 70–99)
Glucose-Capillary: 116 mg/dL — ABNORMAL HIGH (ref 70–99)
Glucose-Capillary: 133 mg/dL — ABNORMAL HIGH (ref 70–99)
Glucose-Capillary: 133 mg/dL — ABNORMAL HIGH (ref 70–99)
Glucose-Capillary: 153 mg/dL — ABNORMAL HIGH (ref 70–99)
Glucose-Capillary: 161 mg/dL — ABNORMAL HIGH (ref 70–99)
Glucose-Capillary: 224 mg/dL — ABNORMAL HIGH (ref 70–99)

## 2021-10-12 LAB — HEMOGLOBIN A1C
Hgb A1c MFr Bld: 6.8 % — ABNORMAL HIGH (ref 4.8–5.6)
Mean Plasma Glucose: 148.46 mg/dL

## 2021-10-12 LAB — BRAIN NATRIURETIC PEPTIDE: B Natriuretic Peptide: 465 pg/mL — ABNORMAL HIGH (ref 0.0–100.0)

## 2021-10-12 LAB — MRSA NEXT GEN BY PCR, NASAL: MRSA by PCR Next Gen: NOT DETECTED

## 2021-10-12 LAB — TROPONIN I (HIGH SENSITIVITY): Troponin I (High Sensitivity): 504 ng/L (ref <18)

## 2021-10-12 LAB — MAGNESIUM
Magnesium: 1.1 mg/dL — ABNORMAL LOW (ref 1.7–2.4)
Magnesium: 3.5 mg/dL — ABNORMAL HIGH (ref 1.7–2.4)

## 2021-10-12 LAB — LACTIC ACID, PLASMA
Lactic Acid, Venous: 1.5 mmol/L (ref 0.5–1.9)
Lactic Acid, Venous: 1.1 mmol/L (ref 0.5–1.9)

## 2021-10-12 LAB — PHOSPHORUS: Phosphorus: 3.6 mg/dL (ref 2.5–4.6)

## 2021-10-12 LAB — PROCALCITONIN: Procalcitonin: 2.53 ng/mL

## 2021-10-12 MED ORDER — LACTATED RINGERS IV BOLUS
500.0000 mL | Freq: Once | INTRAVENOUS | Status: AC
  Administered 2021-10-12: 500 mL via INTRAVENOUS

## 2021-10-12 MED ORDER — SODIUM CHLORIDE 0.9 % IV SOLN
1.0000 g | INTRAVENOUS | Status: DC
  Administered 2021-10-12 – 2021-10-15 (×4): 1 g via INTRAVENOUS
  Filled 2021-10-12 (×4): qty 10

## 2021-10-12 MED ORDER — ROSUVASTATIN CALCIUM 20 MG PO TABS
40.0000 mg | ORAL_TABLET | Freq: Every day | ORAL | Status: DC
  Administered 2021-10-12 – 2021-10-18 (×7): 40 mg via ORAL
  Filled 2021-10-12 (×7): qty 2

## 2021-10-12 MED ORDER — LACTATED RINGERS IV BOLUS
1000.0000 mL | Freq: Once | INTRAVENOUS | Status: AC
  Administered 2021-10-12: 1000 mL via INTRAVENOUS

## 2021-10-12 MED ORDER — CHLORHEXIDINE GLUCONATE CLOTH 2 % EX PADS
6.0000 | MEDICATED_PAD | Freq: Every day | CUTANEOUS | Status: DC
  Administered 2021-10-12 – 2021-10-14 (×2): 6 via TOPICAL

## 2021-10-12 MED ORDER — POTASSIUM CHLORIDE 10 MEQ/100ML IV SOLN
10.0000 meq | INTRAVENOUS | Status: AC
  Administered 2021-10-12 (×2): 10 meq via INTRAVENOUS
  Filled 2021-10-12 (×2): qty 100

## 2021-10-12 MED ORDER — ORAL CARE MOUTH RINSE: 15.0000 mL | OROMUCOSAL | Status: DC | PRN

## 2021-10-12 MED ORDER — DULOXETINE HCL 30 MG PO CPEP
30.0000 mg | ORAL_CAPSULE | Freq: Every day | ORAL | Status: DC
  Administered 2021-10-12 – 2021-10-18 (×7): 30 mg via ORAL
  Filled 2021-10-12 (×7): qty 1

## 2021-10-12 MED ORDER — PANTOPRAZOLE SODIUM 20 MG PO TBEC
20.0000 mg | DELAYED_RELEASE_TABLET | Freq: Every day | ORAL | Status: DC
  Administered 2021-10-12 – 2021-10-18 (×7): 20 mg via ORAL
  Filled 2021-10-12 (×7): qty 1

## 2021-10-12 MED ORDER — PERFLUTREN LIPID MICROSPHERE
1.0000 mL | INTRAVENOUS | Status: AC | PRN
  Administered 2021-10-12: 2 mL via INTRAVENOUS

## 2021-10-12 MED ORDER — LORATADINE 10 MG PO TABS
10.0000 mg | ORAL_TABLET | Freq: Every day | ORAL | Status: DC | PRN
Start: 2021-10-12 — End: 2021-10-18
  Administered 2021-10-16: 10 mg via ORAL
  Filled 2021-10-12: qty 1

## 2021-10-12 MED ORDER — POLYETHYLENE GLYCOL 3350 17 G PO PACK: 17.0000 g | PACK | Freq: Every day | ORAL | Status: DC | PRN

## 2021-10-12 MED ORDER — MAGNESIUM SULFATE 4 GM/100ML IV SOLN
4.0000 g | Freq: Once | INTRAVENOUS | Status: AC
Start: 2021-10-12 — End: 2021-10-12
  Administered 2021-10-12: 4 g via INTRAVENOUS
  Filled 2021-10-12: qty 100

## 2021-10-12 MED ORDER — NOREPINEPHRINE 4 MG/250ML-% IV SOLN
2.0000 ug/min | INTRAVENOUS | Status: DC
  Administered 2021-10-12: 2 ug/min via INTRAVENOUS
  Administered 2021-10-12: 3 ug/min via INTRAVENOUS
  Administered 2021-10-13: 6 ug/min via INTRAVENOUS
  Filled 2021-10-12 (×2): qty 250

## 2021-10-12 MED ORDER — DOCUSATE SODIUM 100 MG PO CAPS: 100.0000 mg | ORAL_CAPSULE | Freq: Two times a day (BID) | ORAL | Status: DC | PRN

## 2021-10-12 MED ORDER — ASPIRIN 81 MG PO TBEC
81.0000 mg | DELAYED_RELEASE_TABLET | Freq: Every day | ORAL | Status: DC
  Administered 2021-10-12 – 2021-10-16 (×5): 81 mg via ORAL
  Filled 2021-10-12 (×5): qty 1

## 2021-10-12 MED ORDER — HEPARIN SODIUM (PORCINE) 5000 UNIT/ML IJ SOLN
5000.0000 [IU] | Freq: Three times a day (TID) | INTRAMUSCULAR | Status: AC
  Administered 2021-10-12 – 2021-10-16 (×14): 5000 [IU] via SUBCUTANEOUS
  Filled 2021-10-12 (×14): qty 1

## 2021-10-12 MED ORDER — CEFTRIAXONE SODIUM 1 G IJ SOLR
1.0000 g | Freq: Once | INTRAMUSCULAR | Status: AC
  Administered 2021-10-12: 1 g via INTRAVENOUS
  Filled 2021-10-12: qty 10

## 2021-10-12 MED ORDER — SALINE SPRAY 0.65 % NA SOLN
1.0000 | Freq: Four times a day (QID) | NASAL | Status: DC | PRN
  Administered 2021-10-17: 1 via NASAL
  Filled 2021-10-12: qty 44

## 2021-10-12 MED ORDER — INSULIN ASPART 100 UNIT/ML IJ SOLN
0.0000 [IU] | INTRAMUSCULAR | Status: DC
  Administered 2021-10-12: 2 [IU] via SUBCUTANEOUS
  Administered 2021-10-12 – 2021-10-13 (×3): 1 [IU] via SUBCUTANEOUS
  Administered 2021-10-13: 2 [IU] via SUBCUTANEOUS
  Administered 2021-10-13: 3 [IU] via SUBCUTANEOUS
  Administered 2021-10-13: 1 [IU] via SUBCUTANEOUS

## 2021-10-12 MED ORDER — POLYVINYL ALCOHOL 1.4 % OP SOLN
1.0000 [drp] | Freq: Three times a day (TID) | OPHTHALMIC | Status: DC
Start: 2021-10-12 — End: 2021-10-18
  Administered 2021-10-12 – 2021-10-18 (×19): 1 [drp] via OPHTHALMIC
  Filled 2021-10-12: qty 15

## 2021-10-12 MED ORDER — POTASSIUM CHLORIDE 20 MEQ PO PACK
20.0000 meq | PACK | Freq: Every day | ORAL | Status: DC
Start: 2021-10-12 — End: 2021-10-12

## 2021-10-12 NOTE — Progress Notes (Signed)
IV team to bedside to place a new IV due to patient pulling her's out. Patient started screaming and refusing to get a new IV. Patient also refusing vital signs. Explained to patient the importance of having an extra IV and importance of monitoring her vital signs. Patient "does not care." Patient stated that she "wants to watch her show." Continuing to monitor patient. Will hold off on the new IV at this time. I wrapped the remaining IV with Kerlix and instructed patient not to pull that out. Levo hooked up to this IV. Continuing to monitor patient.

## 2021-10-12 NOTE — Progress Notes (Addendum)
Medications confirmed with [facility] ([place]  [phone]):  Quetiapine 25 mg BID Famotidine 20 mg every evening August 29th started Macrobid 100 mg BID for 5 days--got 2-3 doses of this per nursing for UTI Aspirin 81 mg once daily Tylenol 325 mg 1 tablet 1 time a day Tramadol 50 mg prn only for severe pain  Klorcon 20 meq 1 daily Metolazone 2.5 mg only on MWF Torsemide 40 mg 1 daily Trazodone 100 mg at bedtime  PRN nitrostat Oxygen at 2 L via [place]--uses at night only

## 2021-10-12 NOTE — Progress Notes (Signed)
  Echocardiogram 2D Echocardiogram has been performed.  Evelyn Simmons 10/12/2021, 11:55 AM

## 2021-10-12 NOTE — Progress Notes (Signed)
Dual chamber pacemaker device check per [place].  Device hit EOL on October 09, 2021.  As a result, the device is now VVI 50.  The patient was previously programmed VVIR 70.  Current pacing RV voltage is 3.5V@0 .27ms but could decrease in the future.  Past checks show patient in AFib.  I did speak to Dr. Caryl Comes at @5 :10pm today.  [facility] [place] [employer] [phone]

## 2021-10-12 NOTE — ED Provider Notes (Signed)
[facility] EMERGENCY DEPARTMENT Provider Note  CSN: [id number] Arrival date & time: 10/11/21 2122  Chief Complaint(s) Fall  HPI Evelyn Simmons is a 88 y.o. female brought in by EMS as a trauma, parent initially called life alert apparently, EMS came and found patient on the kitchen floor incoherent.  Patient had multiple of vomiting with EMS, was given Zofran.  EMS felt patient was a trauma given irregular pupils.  Patient currently denies any symptoms.  Denies headache.  Denies any ongoing nausea or vomiting.  Denies chest pain.  Patient does not know what happened but appears somewhat confused.  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 09/2019 Echo: EF 45-50%, glob HK, mild conc LVH, mildly elev PASP. Sev dil LA. Mildly dil RA. Mild MR.   Anemia    Arthritis    Asthma    CAD (coronary artery disease)    a. 1995 s/p PCI/BMS LAD; b. 2011 s/p PCI/DES RCA.   CHF (congestive heart failure) (HCC)    Chronic kidney disease    COPD (chronic obstructive pulmonary disease) (HCC)    Depression    Diabetes mellitus without complication (HCC)    Diverticulitis    Dysrhythmia    GERD (gastroesophageal reflux disease)    Hyperlipidemia    Hypertension    Hypothyroid    pt unsure, not currently on medication   Ischemic cardiomyopathy    MI (myocardial infarction) ([place])    1995   Persistent atrial fibrillation ([place])    a. No OAC 2/2 h/o bleeding/anemia; b. Longterm amio therapy; c. 08/2019 - recurrent afib-->rate-controlled.   Presence of permanent cardiac pacemaker    Radiculopathy    Patient Active Problem List   Diagnosis Date Noted   Hypotension 10/12/2021   Complete heart block (HCC) 12/13/2019   Black stools    Acute on chronic heart failure with preserved ejection fraction (HFpEF) (HCC)    Acute on chronic systolic CHF (congestive  heart failure) ([place]) 10/18/2019   Diabetes mellitus type 2 in obese ([place]) 10/18/2019   Anemia due to chronic kidney disease 10/18/2019   COPD (chronic obstructive pulmonary disease) ([place]) 10/18/2019   Toe pain, right 10/18/2019   Persistent atrial fibrillation (HCC)    Acute CHF (congestive heart failure) ([place]) 10/04/2019   Chronic respiratory failure with hypoxia (HCC)    CHF (congestive heart failure) ([place]) 09/18/2019   Atrial fibrillation with rapid ventricular response ([place]) 96/75/9163   Acute diastolic CHF (congestive heart failure) ([place]) 09/17/2019   Mobility impaired 05/29/2014   CAD (coronary artery disease) 05/09/2014   History of coronary artery stent placement 05/09/2014   Hyperlipidemia 05/09/2014   Bilateral low back pain with left-sided sciatica 04/21/2014   Knee effusion 04/21/2014   CKD stage 3 due to type 2 diabetes mellitus ([place]) 04/21/2014   Pacemaker 04/21/2014   Encounter to establish care 04/20/2014   Home Medication(s) Prior to Admission medications   Medication Sig Start Date End Date Taking? Authorizing Provider  acetaminophen (TYLENOL) 650 MG CR tablet SMARTSIG:1 Tablet(s) By Mouth Twice Daily 11/11/20   [provider]  aspirin EC 81 MG tablet Take 81 mg by mouth daily.    [provider]  bacitracin ointment Apply to right elbow with Band-Aid 11/07/20 11/07/21  Duanne Guess, PA-C  Blood Glucose Monitoring Suppl (ACCU-CHEK NANO SMARTVIEW) W/DEVICE KIT Please issue nano meter  by AccuChek along with test strips at checking frequency of 2x daily. 90 day supply. E11.9 05/13/14   Phadke, Janina Mayo, MD  carboxymethylcellul-glycerin (OPTIVE) 0.5-0.9 % ophthalmic solution Place 1 drop into both eyes in the morning, at noon, and at bedtime.    [provider]  cholecalciferol (VITAMIN D) 1000 units tablet Take 1,000 Units by mouth daily.    [provider]  diclofenac Sodium (VOLTAREN) 1 % GEL Apply 1 application topically 4 (four)  times daily as needed (left knee pain.).     [provider]  DULoxetine (CYMBALTA) 30 MG capsule Take 30 mg by mouth daily.    [provider]  FEROSUL 325 (65 Fe) MG tablet Take by mouth. 11/11/20   [provider]  glucose blood (FREESTYLE LITE) test strip Use as instructed 05/30/14   Carollee Leitz, RN  Lancets (FREESTYLE) lancets Use as instructed 05/30/14   Carollee Leitz, RN  lidocaine (LIDODERM) 5 % Place 1 patch onto the skin daily as needed (knee pain.). Remove & Discard patch within 12 hours or as directed by MD    [provider]  loratadine (CLARITIN) 10 MG tablet Take 10 mg by mouth daily.    [provider]  metolazone (ZAROXOLYN) 2.5 MG tablet 2.5 mg every Monday, Wednesday, and Friday. 11/18/19   [provider]  MIRALAX 17 GM/SCOOP powder SMARTSIG:17 Gram(s) By Mouth Daily PRN 04/18/20   [provider]  Multiple Vitamins-Minerals (PRESERVISION AREDS 2+MULTI VIT) CAPS Take 1 capsule by mouth 2 (two) times daily.    [provider]  naloxone [facility]) nasal spray 4 mg/0.1 mL Place 1 spray into the nose See admin instructions. Administer 1 spray in one nostril. Repeat every 3 minutes if no response.    [provider]  nitroGLYCERIN (NITROSTAT) 0.4 MG SL tablet Place 0.4 mg under the tongue every 5 (five) minutes x 3 doses as needed for chest pain.     [provider]  nystatin (MYCOSTATIN/NYSTOP) powder Apply 1 application topically 4 (four) times daily as needed (fungal infection).    [provider]  nystatin-triamcinolone (MYCOLOG II) cream Apply 1 application topically 3 (three) times daily as needed (affected area(s)).    [provider]  pantoprazole (PROTONIX) 20 MG tablet Take 20 mg by mouth daily.    [provider]  polyethylene glycol (MIRALAX / GLYCOLAX) 17 g packet Take 17 g by mouth daily as needed (constipation.).     [provider]  potassium  chloride (KLOR-CON) 20 MEQ packet Take 20 mEq by mouth daily. 12/04/20   [provider]  QUEtiapine (SEROQUEL) 25 MG tablet Take 25 mg by mouth at bedtime.     [provider]  rosuvastatin (CRESTOR) 40 MG tablet Take 1 tablet (40 mg total) by mouth daily. 05/09/14   Antonieta Iba, MD  Sennosides-Docusate Sodium (SENEXON-S PO) Take 2 tablets by mouth daily as needed (constipation.).     [provider]  sodium chloride (OCEAN) 0.65 % nasal spray Place 1 spray into the nose 4 (four) times daily as needed for congestion (nasal dryness).    [provider]  torsemide (DEMADEX) 20 MG tablet Take 2 tablets (40 mg) by mouth once daily    [provider]  traZODone (DESYREL) 100 MG tablet Take 100 mg by mouth at bedtime.    [provider]  vitamin B-12 1000 MCG tablet Take 1 tablet (1,000 mcg total) by mouth daily. 10/11/19  Ezekiel Slocumb, DO                                                                                                                                    Past Surgical History Past Surgical History:  Procedure Laterality Date   ABDOMINAL HYSTERECTOMY     APPENDECTOMY     AV NODE ABLATION N/A 12/13/2019   Procedure: AV NODE ABLATION;  Surgeon: Deboraha Sprang, MD;  Location: [facility];  Service: Cardiovascular;  Laterality: N/A;   CARDIAC CATHETERIZATION     COLONOSCOPY WITH PROPOFOL N/A 10/21/2019   Procedure: COLONOSCOPY WITH PROPOFOL;  Surgeon: Lin Landsman, MD;  Location: [facility] ENDOSCOPY;  Service: Gastroenterology;  Laterality: N/A;   CORONARY ANGIOPLASTY WITH STENT PLACEMENT  12/11/1993   RCA   ESOPHAGOGASTRODUODENOSCOPY (EGD) WITH PROPOFOL N/A 10/21/2019   Procedure: ESOPHAGOGASTRODUODENOSCOPY (EGD) WITH PROPOFOL;  Surgeon: Lin Landsman, MD;  Location: [facility];  Service: Gastroenterology;  Laterality: N/A;   FOOT SURGERY Bilateral    Over 25 years ago- Bunionectomy   GIVENS CAPSULE STUDY N/A  10/22/2019   Procedure: GIVENS CAPSULE STUDY;  Surgeon: Lin Landsman, MD;  Location: [facility] ENDOSCOPY;  Service: Gastroenterology;  Laterality: N/A;   INSERT / REPLACE / REMOVE PACEMAKER  09/2012   Model # O242  serial # [id number]   JOINT REPLACEMENT     bilateral knee replacements   Family History Family History  Problem Relation Age of Onset   Arthritis Mother    Heart disease Mother    Hypertension Mother    Diabetes Mother    Arthritis Father    Heart disease Father    Hypertension Father    Cancer Sister        breast cancer   Diabetes Brother    Cancer Daughter        lung cancer   Diabetes Sister     Social History Social History   Tobacco Use   Smoking status: Former    Types: Cigarettes    Quit date: 12/10/1993    Years since quitting: 27.8   Smokeless tobacco: Former  Scientific laboratory technician Use: Never used  Substance Use Topics   Alcohol use: Yes    Alcohol/week: 0.0 standard drinks of alcohol    Comment: Rare   Drug use: No   Allergies Penicillins, Other, Sulfa antibiotics, and Penicillin g  Review of Systems Review of Systems  Unable to perform ROS: Mental status change    Physical Exam Vital Signs  I have reviewed the triage vital signs BP (!) 82/40 (BP Location: Right Arm)   Pulse (!) 49   Temp (!) 97.5 F (36.4 C) (Axillary)   Resp 20   Ht $R'4\' 11"'KT$  (1.499 m)   Wt 70 kg   SpO2 97%   BMI 31.17 kg/m  Physical Exam Vitals and nursing note reviewed.  Constitutional:  General: She is not in acute distress.    Appearance: She is well-developed.  HENT:     Head: Normocephalic and atraumatic.     Mouth/Throat:     Mouth: Mucous membranes are moist.  Eyes:     Pupils: Pupils are equal, round, and reactive to light.  Neck:     Comments: No midline C-spine tenderness Cardiovascular:     Rate and Rhythm: Regular rhythm. Bradycardia present.     Heart sounds: No murmur heard. Pulmonary:     Effort: Pulmonary effort is normal. No  respiratory distress.     Breath sounds: Normal breath sounds.  Abdominal:     General: Abdomen is flat.     Palpations: Abdomen is soft.     Tenderness: There is no abdominal tenderness.  Musculoskeletal:        General: No tenderness.     Right lower leg: No edema.     Left lower leg: No edema.  Skin:    General: Skin is warm and dry.  Neurological:     General: No focal deficit present.     Mental Status: She is alert. Mental status is at baseline.  Psychiatric:        Mood and Affect: Mood normal.        Behavior: Behavior normal.     ED Results and Treatments Labs (all labs ordered are listed, but only abnormal results are displayed) Labs Reviewed  BASIC METABOLIC PANEL - Abnormal; Notable for the following components:      Result Value   Potassium 2.6 (*)    CO2 21 (*)    Glucose, Bld 204 (*)    BUN 60 (*)    Creatinine, Ser 1.90 (*)    GFR, Estimated 25 (*)    Anion gap 19 (*)    All other components within normal limits  CBC WITH DIFFERENTIAL/PLATELET - Abnormal; Notable for the following components:   WBC 10.9 (*)    RBC 3.09 (*)    Hemoglobin 9.8 (*)    HCT 31.1 (*)    MCV 100.6 (*)    Neutro Abs 9.0 (*)    Abs Immature Granulocytes 0.08 (*)    All other components within normal limits  PROTIME-INR - Abnormal; Notable for the following components:   Prothrombin Time 15.5 (*)    All other components within normal limits  MAGNESIUM - Abnormal; Notable for the following components:   Magnesium 1.1 (*)    All other components within normal limits  TROPONIN I (HIGH SENSITIVITY) - Abnormal; Notable for the following components:   Troponin I (High Sensitivity) 153 (*)    All other components within normal limits  TROPONIN I (HIGH SENSITIVITY) - Abnormal; Notable for the following components:   Troponin I (High Sensitivity) 504 (*)    All other components within normal limits  LACTIC ACID, PLASMA  BRAIN NATRIURETIC PEPTIDE  URINALYSIS, ROUTINE W REFLEX  MICROSCOPIC  LACTIC ACID, PLASMA  PROCALCITONIN  CBC  BASIC METABOLIC PANEL  MAGNESIUM  PHOSPHORUS  Radiology CT Head Wo Contrast  Result Date: 10/11/2021 CLINICAL DATA:  Fall, head and neck trauma. EXAM: CT HEAD WITHOUT CONTRAST CT CERVICAL SPINE WITHOUT CONTRAST TECHNIQUE: Multidetector CT imaging of the head and cervical spine was performed following the standard protocol without intravenous contrast. Multiplanar CT image reconstructions of the cervical spine were also generated. RADIATION DOSE REDUCTION: This exam was performed according to the departmental dose-optimization program which includes automated exposure control, adjustment of the mA and/or kV according to patient size and/or use of iterative reconstruction technique. COMPARISON:  04/26/2021. FINDINGS: CT HEAD FINDINGS Brain: No acute intracranial hemorrhage, midline shift or mass effect. No extra-axial fluid collection. Diffuse atrophy is noted. Subcortical and periventricular white matter hypodensities are present bilaterally. No hydrocephalus. Vascular: Atherosclerotic calcification of the carotid siphons and vertebral arteries. No hyperdense vessel. Skull: No acute fracture. Sinuses/Orbits: No acute finding. Other: None. CT CERVICAL SPINE FINDINGS Alignment: Mild anterolisthesis at C5-C6 and C7-T1. Skull base and vertebrae: No acute fracture. Soft tissues and spinal canal: No prevertebral fluid or swelling. No visible canal hematoma. Disc levels: Multilevel intervertebral disc space narrowing, uncovertebral osteophyte formation, and facet arthropathy. Upper chest: Emphysematous changes in the lungs. Mild dependent atelectasis bilaterally. Aortic atherosclerosis. Other: Carotid artery calcifications. IMPRESSION: 1. No acute intracranial process. 2. Atrophy with chronic microvascular ischemic changes. 3. Multilevel  degenerative changes in the cervical spine without evidence of acute fracture. Electronically Signed   By: Brett Fairy M.D.   On: 10/11/2021 22:02   CT Cervical Spine Wo Contrast  Result Date: 10/11/2021 CLINICAL DATA:  Fall, head and neck trauma. EXAM: CT HEAD WITHOUT CONTRAST CT CERVICAL SPINE WITHOUT CONTRAST TECHNIQUE: Multidetector CT imaging of the head and cervical spine was performed following the standard protocol without intravenous contrast. Multiplanar CT image reconstructions of the cervical spine were also generated. RADIATION DOSE REDUCTION: This exam was performed according to the departmental dose-optimization program which includes automated exposure control, adjustment of the mA and/or kV according to patient size and/or use of iterative reconstruction technique. COMPARISON:  04/26/2021. FINDINGS: CT HEAD FINDINGS Brain: No acute intracranial hemorrhage, midline shift or mass effect. No extra-axial fluid collection. Diffuse atrophy is noted. Subcortical and periventricular white matter hypodensities are present bilaterally. No hydrocephalus. Vascular: Atherosclerotic calcification of the carotid siphons and vertebral arteries. No hyperdense vessel. Skull: No acute fracture. Sinuses/Orbits: No acute finding. Other: None. CT CERVICAL SPINE FINDINGS Alignment: Mild anterolisthesis at C5-C6 and C7-T1. Skull base and vertebrae: No acute fracture. Soft tissues and spinal canal: No prevertebral fluid or swelling. No visible canal hematoma. Disc levels: Multilevel intervertebral disc space narrowing, uncovertebral osteophyte formation, and facet arthropathy. Upper chest: Emphysematous changes in the lungs. Mild dependent atelectasis bilaterally. Aortic atherosclerosis. Other: Carotid artery calcifications. IMPRESSION: 1. No acute intracranial process. 2. Atrophy with chronic microvascular ischemic changes. 3. Multilevel degenerative changes in the cervical spine without evidence of acute fracture.  Electronically Signed   By: Brett Fairy M.D.   On: 10/11/2021 22:02   DG Chest Portable 1 View  Result Date: 10/11/2021 CLINICAL DATA:  Fall trauma EXAM: PORTABLE CHEST 1 VIEW COMPARISON:  04/26/2021 FINDINGS: Left-sided pacing device as before. Cardiomegaly with central vascular congestion. No acute airspace disease, pleural effusion or pneumothorax. Aortic atherosclerosis. IMPRESSION: Cardiomegaly with central vascular congestion. Electronically Signed   By: Donavan Foil M.D.   On: 10/11/2021 21:45    Pertinent labs & imaging results that were available during my care of the patient were reviewed by me and considered in my medical decision  making (see MDM for details).  Medications Ordered in ED Medications  potassium chloride (KLOR-CON) packet 40 mEq (40 mEq Oral Given 10/12/21 0040)  0.9 %  sodium chloride infusion (0 mLs Intravenous Hold 10/12/21 0112)  norepinephrine (LEVOPHED) 4mg  in 269mL (0.016 mg/mL) premix infusion (2 mcg/min Intravenous New Bag/Given 10/12/21 0153)  docusate sodium (COLACE) capsule 100 mg (has no administration in time range)  polyethylene glycol (MIRALAX / GLYCOLAX) packet 17 g (has no administration in time range)  heparin injection 5,000 Units (has no administration in time range)  cefTRIAXone (ROCEPHIN) 1 g in sodium chloride 0.9 % 100 mL IVPB (has no administration in time range)  cefTRIAXone (ROCEPHIN) 1 g in sodium chloride 0.9 % 100 mL IVPB (has no administration in time range)  aspirin EC tablet 81 mg (has no administration in time range)  carboxymethylcellul-glycerin (REFRESH OPTIVE) 0.5-0.9 % ophthalmic solution 1 drop (has no administration in time range)  DULoxetine (CYMBALTA) DR capsule 30 mg (has no administration in time range)  loratadine (CLARITIN) tablet 10 mg (has no administration in time range)  pantoprazole (PROTONIX) EC tablet 20 mg (has no administration in time range)  rosuvastatin (CRESTOR) tablet 40 mg (has no administration in time range)   sodium chloride (OCEAN) 0.65 % nasal spray 1 spray (has no administration in time range)  magnesium sulfate IVPB 4 g 100 mL (has no administration in time range)  magnesium sulfate IVPB 2 g 50 mL (2 g Intravenous New Bag/Given 10/12/21 0044)  lactated ringers bolus 500 mL (500 mLs Intravenous New Bag/Given 10/12/21 0040)  lactated ringers bolus 500 mL (500 mLs Intravenous New Bag/Given 10/12/21 0113)                                                                                                                                     Procedures .Critical Care  Performed by: Cristie Hem, MD Authorized by: Cristie Hem, MD   Critical care provider statement:    Critical care time (minutes):  30   Critical care time was exclusive of:  Separately billable procedures and treating other patients   Critical care was necessary to treat or prevent imminent or life-threatening deterioration of the following conditions:  Cardiac failure, circulatory failure, shock and trauma   Critical care was time spent personally by me on the following activities:  Development of treatment plan with patient or surrogate, discussions with consultants, evaluation of patient's response to treatment, examination of patient, ordering and review of laboratory studies, ordering and review of radiographic studies, ordering and performing treatments and interventions, pulse oximetry, re-evaluation of patient's condition and review of old charts   Care discussed with: admitting provider     (including critical care time)  Medical Decision Making / ED Course   MDM:  88 year old female presenting to the emergency department after possible fall.  Patient overall well-appearing with no external signs of trauma.  Despite patient appearing  well and answering questions appropriately, maintaining alert mental status, remaining oriented to person place and situation, she has had persistent hypotension.  She has no focal  symptoms.  Hemoglobin slightly lower than previous but no obvious history of blood loss.  No report of hematemesis.  Patient denies black stool.  Chest x-ray clear.  Initial troponin notably elevated at 150.  EKG paced.  Potassium also notably low at 2.6.  Will replete.  Creatinine appears similar to baseline.  Exam other is with no focal findings.  Given persistent hypotension, discussed with ICU who admit the per patient.  Discussed initial elevated troponin with Dr. Justice Britain who recommends interrogating pacemaker and repleted potassium.     Additional history obtained: -Additional history obtained from ems -External records from outside source obtained and reviewed including: Chart review including previous notes, labs, imaging, consultation notes   Lab Tests: -I ordered, reviewed, and interpreted labs.   The pertinent results include:   Labs Reviewed  BASIC METABOLIC PANEL - Abnormal; Notable for the following components:      Result Value   Potassium 2.6 (*)    CO2 21 (*)    Glucose, Bld 204 (*)    BUN 60 (*)    Creatinine, Ser 1.90 (*)    GFR, Estimated 25 (*)    Anion gap 19 (*)    All other components within normal limits  CBC WITH DIFFERENTIAL/PLATELET - Abnormal; Notable for the following components:   WBC 10.9 (*)    RBC 3.09 (*)    Hemoglobin 9.8 (*)    HCT 31.1 (*)    MCV 100.6 (*)    Neutro Abs 9.0 (*)    Abs Immature Granulocytes 0.08 (*)    All other components within normal limits  PROTIME-INR - Abnormal; Notable for the following components:   Prothrombin Time 15.5 (*)    All other components within normal limits  MAGNESIUM - Abnormal; Notable for the following components:   Magnesium 1.1 (*)    All other components within normal limits  TROPONIN I (HIGH SENSITIVITY) - Abnormal; Notable for the following components:   Troponin I (High Sensitivity) 153 (*)    All other components within normal limits  TROPONIN I (HIGH SENSITIVITY) - Abnormal; Notable  for the following components:   Troponin I (High Sensitivity) 504 (*)    All other components within normal limits  LACTIC ACID, PLASMA  BRAIN NATRIURETIC PEPTIDE  URINALYSIS, ROUTINE W REFLEX MICROSCOPIC  LACTIC ACID, PLASMA  PROCALCITONIN  CBC  BASIC METABOLIC PANEL  MAGNESIUM  PHOSPHORUS      EKG   EKG Interpretation  Date/Time:  Thursday October 11 2021 21:52:16 EDT Ventricular Rate:  80 PR Interval:    QRS Duration: 206 QT Interval:  504 QTC Calculation: 446 R Axis:   -70 Text Interpretation: paced rhythm bigeminy w/ possible junctional rhythm as well Confirmed by Merrily Pew [phone]) on 10/11/2021 11:03:00 PM         Imaging Studies ordered: I ordered imaging studies including CT head, neck, CXR On my interpretation imaging demonstrates no acute findings I independently visualized and interpreted imaging. I agree with the radiologist interpretation   Medicines ordered and prescription drug management: Meds ordered this encounter  Medications   potassium chloride (KLOR-CON) packet 40 mEq   magnesium sulfate IVPB 2 g 50 mL   0.9 %  sodium chloride infusion   DISCONTD: norepinephrine (LEVOPHED) 4mg  in 268mL (0.016 mg/mL) premix infusion    Order Specific Question:  IV Access    Answer:   Peripheral   DISCONTD: lactated ringers bolus 250 mL   lactated ringers bolus 500 mL   lactated ringers bolus 500 mL   norepinephrine (LEVOPHED) 4mg  in 252mL (0.016 mg/mL) premix infusion    Order Specific Question:   IV Access    Answer:   Peripheral   docusate sodium (COLACE) capsule 100 mg   polyethylene glycol (MIRALAX / GLYCOLAX) packet 17 g   heparin injection 5,000 Units   cefTRIAXone (ROCEPHIN) 1 g in sodium chloride 0.9 % 100 mL IVPB    Order Specific Question:   Antibiotic Indication:    Answer:   UTI   cefTRIAXone (ROCEPHIN) 1 g in sodium chloride 0.9 % 100 mL IVPB    Order Specific Question:   Antibiotic Indication:    Answer:   UTI   aspirin EC tablet 81  mg   carboxymethylcellul-glycerin (REFRESH OPTIVE) 0.5-0.9 % ophthalmic solution 1 drop   DULoxetine (CYMBALTA) DR capsule 30 mg   loratadine (CLARITIN) tablet 10 mg   pantoprazole (PROTONIX) EC tablet 20 mg   DISCONTD: potassium chloride (KLOR-CON) packet 20 mEq   rosuvastatin (CRESTOR) tablet 40 mg   sodium chloride (OCEAN) 0.65 % nasal spray 1 spray   magnesium sulfate IVPB 4 g 100 mL    -I have reviewed the patients home medicines and have made adjustments as needed   Consultations Obtained: I requested consultation with the cardiologist,  and discussed lab and imaging findings as well as pertinent plan - they recommend: pacemaker interrogation, potassium repletion   Cardiac Monitoring: The patient was maintained on a cardiac monitor.  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 09/2019 Echo: EF 45-50%, glob HK, mild conc LVH, mildly elev PASP. Sev dil LA. Mildly dil RA. Mild MR.   Anemia    Arthritis    Asthma    CAD (coronary artery disease)    a. 1995 s/p PCI/BMS LAD; b. 2011 s/p PCI/DES RCA.   CHF (congestive heart failure) (HCC)    Chronic kidney disease    COPD (chronic obstructive pulmonary disease) (HCC)    Depression    Diabetes mellitus without complication (HCC)    Diverticulitis    Dysrhythmia    GERD (gastroesophageal reflux disease)    Hyperlipidemia    Hypertension    Hypothyroid    pt unsure, not currently on medication   Ischemic cardiomyopathy    MI (myocardial infarction) ([place])    1995   Persistent atrial  fibrillation ([place])    a. No OAC 2/2 h/o bleeding/anemia; b. Longterm amio therapy; c. 08/2019 - recurrent afib-->rate-controlled.   Presence of permanent cardiac pacemaker    Radiculopathy       Dispostion: Admit    Final Clinical Impression(s) / ED Diagnoses Final diagnoses:  Fall, initial encounter  Hypotension, unspecified hypotension type  Syncope and collapse     This chart was dictated using voice recognition software.  Despite best efforts to proofread,  errors can occur which can change the documentation meaning.    Cristie Hem, MD 10/12/21  0206  

## 2021-10-12 NOTE — Progress Notes (Addendum)
NAMEKatria Simmons, MRN:  [id number], DOB:  02-24-1929, LOS: 0 ADMISSION DATE:  10/11/2021, CONSULTATION DATE: 10/12/2021 REFERRING MD:  EDP, CHIEF COMPLAINT:  Hypotension   History of Present Illness:  88 year old woman who presented to [facility] ED 8/31 after a fall at home. Per EMS, notified by [facility]; patient was found down on kitchen floor, initially incoherent but becoming more lucid en route to ED. Bradycardic to 40s despite pacemaker, also with emesis x 3.   Per chart review patient had a few days where she wasn't feeling well with decreased PO intake and was recently diagnosed with UTI earlier this week and prescribed macrobid on 8/29 which she got 2-3 doses of per [facility]. She is unsure if she took this or not. In ED was hypotensive and MAPs of 50s with wide pulse pressure. She was given two 500 mL LR boluses in which her pressures were responsive to at first but continued to drop into 70s-80s requiring peripheral pressor initiation.  Pertinent  Medical History  PMHx significant for HTN, HLD, CAD (DES 2011), MI, CHF/ICM (Echo 2022 EF 45-50%, +RWMAs), PAF/CHB (s/p pacemaker placement), COPD, T2DM, CKD, GERD, hypothyroidism.  Significant Hospital Events: Including procedures, antibiotic start and stop dates in addition to other pertinent events   8/31 Presented to [facility] ED via EMS after fall at home. [facility] called EMS. CT Head/Cspine negative. Hypotension with SBPs in 90s and MAPs in 50s. Fluids given without significant improvement 9/1 Pressors initiated peripherally. PCCM consulted for ICU admission. 9/1 CTX started for UTI 9/1 Troponin elevation to 504   Interim History / Subjective:  Patient denies any pain this morning.  Responsive to questions, would like something to drink.   Objective   Blood pressure (!) 125/45, pulse (!) 50, temperature 98.4 F (36.9 C), temperature source Oral, resp. rate 17, height _0  (1.499 m), weight 67 kg, SpO2 98 %.        Intake/Output Summary  (Last 24 hours) at 10/12/2021 0908 Last data filed at 10/12/2021 0800 Gross per 24 hour  Intake 1389.07 ml  Output 320 ml  Net 1069.07 ml   Filed Weights   10/11/21 0121 10/12/21 0306  Weight: 70 kg 67 kg    Examination: General: Chronically ill appearing elderly woman in NAD, responsive to questions HENT: NCAT, anicteric sclera, mildly dry MM Lungs: CTAB no w/r/c in anterior lung fields, breathing even and unlabored on room air  Cardiovascular: Bradycardia, paced at 50 , no m/r/g Abdomen: Soft, nontender, nondistended, normoactive BS, no flank tenderness Extremities: No LE edema, SCDs in place Neuro: Warm/dry, ecchymosis on left hand  Resolved Hospital Problem list     Assessment & Plan:  Undifferentiated hypotension d/t hypovolemia/dehydration vs septic shock from UTI Improving today on levophed, resuscitated with additional bolus of LR this morning. Will obtain Bcxs -Goal SBP >100 -Fluid resuscitation  -Peripheral levophed titrated to goal SBG -Trend WBC, fever curve, LA -F/u PCT -F/u Urine and Blood cx -Continue CTX for empiric tx of UTI  Recent UTI AKI on CKD Hypokalemia Hypomagnesemia Per daughter, patient recently treated with UTI week of 8/28-PACE says she was taking macrobid 100 mg BID 5 day course starting on 8/29 and got 2-3 doses of this. Patient is unsure if she started taking this. Patient has baseline CKD w Cr 1.2-1.4. Improved this morning. UA showed moderate leuks this morning -Trend BMP -Replete electrolytes as indicated -Monitor I&Os -Avoid nephrotoxic agents as able -Ensure adequate renal perfusion -Continue CTX for possible UTI > f/u  Ucx  Bradycardia Complete AV Black s/p pacemaker impantation PAF Hx of CAD s/p DES Hx of MI CHF/ICM Elevated Troponin Echo 2022 with EF 45-50%, +RWMAs, PAF/CHB s/p pacemaker placement. Troponin elevated to 504 from 28-asymptomatic, no acute ischemic changes on EKG, likely demand ischemia but continue to  monitor -cardiac monitoring -pacemaker in place, monitor for capture -trend trops -EKG prn -F/u Echo -Cardiology consulted -Continue ASA/statin -Hold diuretics in setting of hypotension  COPD Hx of requiring 2 L of oxygen at night only per [facility]. -continue supplemental O2 prn -Monitor FiO2  -Pulmonary hygeine  T2DM -SSI -CBGs q4h -Goal CBG 140-180  GERD -PPI  Hypothyroidism  -Resume levothyroxine if taking at home - ([facility] did not say she is taking this)  Best Practice (right click and "Reselect all SmartList Selections" daily)   Diet/type: NPO w/ oral meds DVT prophylaxis: systemic heparin GI prophylaxis: PPI Lines: N/A Foley:  N/A Code Status:  full code Last date of multidisciplinary goals of care discussion [9/1 0215 AM Polly-daughter]  Labs   CBC: Recent Labs  Lab 10/11/21 2133 10/12/21 0627  WBC 10.9* 8.5  NEUTROABS 9.0*  --   HGB 9.8* 8.6*  HCT 31.1* 26.3*  MCV 100.6* 97.4  PLT 267 854    Basic Metabolic Panel: Recent Labs  Lab 10/11/21 2133 10/12/21 0045 10/12/21 0627  NA 142  --  140  K 2.6*  --  3.0*  CL 102  --  103  CO2 21*  --  26  GLUCOSE 204*  --  142*  BUN 60*  --  55*  CREATININE 1.90*  --  1.70*  CALCIUM 10.2  --  9.6  MG  --  1.1* 3.5*  PHOS  --   --  3.6   GFR: Estimated Creatinine Clearance: 18.3 mL/min (A) (by C-G formula based on SCr of 1.7 mg/dL (H)). Recent Labs  Lab 10/11/21 2133 10/12/21 0045 10/12/21 0627  WBC 10.9*  --  8.5  LATICACIDVEN  --  1.5 1.1    Liver Function Tests: No results for input(s): "AST", "ALT", "ALKPHOS", "BILITOT", "PROT", "ALBUMIN" in the last 168 hours. No results for input(s): "LIPASE", "AMYLASE" in the last 168 hours. No results for input(s): "AMMONIA" in the last 168 hours.  ABG No results found for: "PHART", "PCO2ART", "PO2ART", "HCO3", "TCO2", "ACIDBASEDEF", "O2SAT"   Coagulation Profile: Recent Labs  Lab 10/11/21 2133  INR 1.2    Cardiac Enzymes: No results for  input(s): "CKTOTAL", "CKMB", "CKMBINDEX", "TROPONINI" in the last 168 hours.  HbA1C: Hgb A1c MFr Bld  Date/Time Value Ref Range Status  10/12/2021 06:27 AM 6.8 (H) 4.8 - 5.6 % Final    Comment:    (NOTE) Pre diabetes:          5.7%-6.4%  Diabetes:              >6.4%  Glycemic control for   <7.0% adults with diabetes   09/17/2019 03:39 PM 6.9 (H) 4.8 - 5.6 % Final    Comment:    (NOTE)         Prediabetes: 5.7 - 6.4         Diabetes: >6.4         Glycemic control for adults with diabetes: <7.0     CBG: Recent Labs  Lab 10/12/21 0237 10/12/21 0339 10/12/21 0722  GLUCAP 153* 161* 133*    Review of Systems:   Denies any CP, abdominal pain, SOB  Past Medical History:  She,  has a  past medical history of (HFmrEF) heart failure with mid-range ejection fraction ([place]), Anemia, Arthritis, Asthma, CAD (coronary artery disease), CHF (congestive heart failure) ([place]), Chronic kidney disease, COPD (chronic obstructive pulmonary disease) ([place]), Depression, Diabetes mellitus without complication ([place]), Diverticulitis, Dysrhythmia, GERD (gastroesophageal reflux disease), Hyperlipidemia, Hypertension, Hypothyroid, Ischemic cardiomyopathy, MI (myocardial infarction) ([place]), Persistent atrial fibrillation ([place]), Presence of permanent cardiac pacemaker, and Radiculopathy.   Surgical History:   Past Surgical History:  Procedure Laterality Date   ABDOMINAL HYSTERECTOMY     APPENDECTOMY     AV NODE ABLATION N/A 12/13/2019   Procedure: AV NODE ABLATION;  Surgeon: Deboraha Sprang, MD;  Location: [facility];  Service: Cardiovascular;  Laterality: N/A;   CARDIAC CATHETERIZATION     COLONOSCOPY WITH PROPOFOL N/A 10/21/2019   Procedure: COLONOSCOPY WITH PROPOFOL;  Surgeon: Lin Landsman, MD;  Location: [facility] ENDOSCOPY;  Service: Gastroenterology;  Laterality: N/A;   CORONARY ANGIOPLASTY WITH STENT PLACEMENT  12/11/1993   RCA   ESOPHAGOGASTRODUODENOSCOPY (EGD) WITH PROPOFOL N/A 10/21/2019    Procedure: ESOPHAGOGASTRODUODENOSCOPY (EGD) WITH PROPOFOL;  Surgeon: Lin Landsman, MD;  Location: [facility];  Service: Gastroenterology;  Laterality: N/A;   FOOT SURGERY Bilateral    Over 25 years ago- Bunionectomy   GIVENS CAPSULE STUDY N/A 10/22/2019   Procedure: GIVENS CAPSULE STUDY;  Surgeon: Lin Landsman, MD;  Location: [facility] ENDOSCOPY;  Service: Gastroenterology;  Laterality: N/A;   INSERT / REPLACE / REMOVE PACEMAKER  09/2012   Model # N462  serial # [id number]   JOINT REPLACEMENT     bilateral knee replacements     Social History:   reports that she quit smoking about 27 years ago. Her smoking use included cigarettes. She has quit using smokeless tobacco. She reports current alcohol use. She reports that she does not use drugs.   Family History:  Her family history includes Arthritis in her father and mother; Cancer in her daughter and sister; Diabetes in her brother, mother, and sister; Heart disease in her father and mother; Hypertension in her father and mother.   Allergies Allergies  Allergen Reactions   Penicillins Nausea And Vomiting, Rash and Hives   Other Other (See Comments)   Sulfa Antibiotics     Pt unsure reaction, was told in hospital she had allergy   Penicillin G Rash     Home Medications  Prior to Admission medications   Medication Sig Start Date End Date Taking? Authorizing Provider  acetaminophen (TYLENOL) 650 MG CR tablet SMARTSIG:1 Tablet(s) By Mouth Twice Daily 11/11/20   [provider]  aspirin EC 81 MG tablet Take 81 mg by mouth daily.    [provider]  bacitracin ointment Apply to right elbow with Band-Aid 11/07/20 11/07/21  Duanne Guess, PA-C  Blood Glucose Monitoring Suppl (ACCU-CHEK NANO SMARTVIEW) W/DEVICE KIT Please issue nano meter by AccuChek along with test strips at checking frequency of 2x daily. 90 day supply. E11.9 05/13/14   Phadke, Karsten Ro, MD  carboxymethylcellul-glycerin (OPTIVE) 0.5-0.9 %  ophthalmic solution Place 1 drop into both eyes in the morning, at noon, and at bedtime.    [provider]  cholecalciferol (VITAMIN D) 1000 units tablet Take 1,000 Units by mouth daily.    [provider]  diclofenac Sodium (VOLTAREN) 1 % GEL Apply 1 application topically 4 (four) times daily as needed (left knee pain.).     [provider]  DULoxetine (CYMBALTA) 30 MG capsule Take 30 mg by mouth daily.    [provider]  FEROSUL 325 (65 Fe) MG tablet Take by mouth. 11/11/20   [provider]  glucose blood (FREESTYLE LITE) test strip Use as instructed 05/30/14   Rubbie Battiest, RN  Lancets (FREESTYLE) lancets Use as instructed 05/30/14   Rubbie Battiest, RN  lidocaine (LIDODERM) 5 % Place 1 patch onto the skin daily as needed (knee pain.). Remove & Discard patch within 12 hours or as directed by MD    [provider]  loratadine (CLARITIN) 10 MG tablet Take 10 mg by mouth daily.    [provider]  metolazone (ZAROXOLYN) 2.5 MG tablet 2.5 mg every Monday, Wednesday, and Friday. 11/18/19   [provider]  MIRALAX 17 GM/SCOOP powder SMARTSIG:17 Gram(s) By Mouth Daily PRN 04/18/20   [provider]  Multiple Vitamins-Minerals (PRESERVISION AREDS 2+MULTI VIT) CAPS Take 1 capsule by mouth 2 (two) times daily.    [provider]  naloxone [facility]) nasal spray 4 mg/0.1 mL Place 1 spray into the nose See admin instructions. Administer 1 spray in one nostril. Repeat every 3 minutes if no response.    [provider]  nitroGLYCERIN (NITROSTAT) 0.4 MG SL tablet Place 0.4 mg under the tongue every 5 (five) minutes x 3 doses as needed for chest pain.     [provider]  nystatin (MYCOSTATIN/NYSTOP) powder Apply 1 application topically 4 (four) times daily as needed (fungal infection).    [provider]  nystatin-triamcinolone (MYCOLOG II) cream Apply 1 application topically 3 (three) times daily as  needed (affected area(s)).    [provider]  pantoprazole (PROTONIX) 20 MG tablet Take 20 mg by mouth daily.    [provider]  polyethylene glycol (MIRALAX / GLYCOLAX) 17 g packet Take 17 g by mouth daily as needed (constipation.).     [provider]  potassium chloride (KLOR-CON) 20 MEQ packet Take 20 mEq by mouth daily. 12/04/20   [provider]  QUEtiapine (SEROQUEL) 25 MG tablet Take 25 mg by mouth at bedtime.     [provider]  rosuvastatin (CRESTOR) 40 MG tablet Take 1 tablet (40 mg total) by mouth daily. 05/09/14   Minna Merritts, MD  Sennosides-Docusate Sodium (SENEXON-S PO) Take 2 tablets by mouth daily as needed (constipation.).     [provider]  sodium chloride (OCEAN) 0.65 % nasal spray Place 1 spray into the nose 4 (four) times daily as needed for congestion (nasal dryness).    [provider]  torsemide (DEMADEX) 20 MG tablet Take 2 tablets (40 mg) by mouth once daily    [provider]  traZODone (DESYREL) 100 MG tablet Take 100 mg by mouth at bedtime.    [provider]  vitamin B-12 1000 MCG tablet Take 1 tablet (1,000 mcg total) by mouth daily. 10/11/19   Ezekiel Slocumb, DO     Critical care time:

## 2021-10-12 NOTE — ED Provider Notes (Signed)
12:51 AM Assumed care from Dr. Truett Mainland, please see their note for full history, physical and decision making until this point. In brief this is a 88 y.o. year old female who presented to the ED tonight with Fall     Fall, weakness, hypotension, hypokalemia pending PCCM consult. Trop up as well. Cards not worried.  Has gotten fluids, potassium, magnesium.   PCCM PA to see.   PCCM to admit.   Labs, studies and imaging reviewed by myself and considered in medical decision making if ordered. Imaging interpreted by radiology.  Labs Reviewed  BASIC METABOLIC PANEL - Abnormal; Notable for the following components:      Result Value   Potassium 2.6 (*)    CO2 21 (*)    Glucose, Bld 204 (*)    BUN 60 (*)    Creatinine, Ser 1.90 (*)    GFR, Estimated 25 (*)    Anion gap 19 (*)    All other components within normal limits  CBC WITH DIFFERENTIAL/PLATELET - Abnormal; Notable for the following components:   WBC 10.9 (*)    RBC 3.09 (*)    Hemoglobin 9.8 (*)    HCT 31.1 (*)    MCV 100.6 (*)    Neutro Abs 9.0 (*)    Abs Immature Granulocytes 0.08 (*)    All other components within normal limits  PROTIME-INR - Abnormal; Notable for the following components:   Prothrombin Time 15.5 (*)    All other components within normal limits  TROPONIN I (HIGH SENSITIVITY) - Abnormal; Notable for the following components:   Troponin I (High Sensitivity) 153 (*)    All other components within normal limits  BRAIN NATRIURETIC PEPTIDE  MAGNESIUM  URINALYSIS, ROUTINE W REFLEX MICROSCOPIC  LACTIC ACID, PLASMA  LACTIC ACID, PLASMA  TROPONIN I (HIGH SENSITIVITY)    CT Head Wo Contrast  Final Result    CT Cervical Spine Wo Contrast  Final Result    DG Chest Portable 1 View  Final Result      No follow-ups on file.    Islam Villescas, Corene Cornea, MD 10/12/21 [phone]

## 2021-10-12 NOTE — Plan of Care (Signed)

## 2021-10-12 NOTE — ED Notes (Signed)
Attempted to interrogate pacemaker, unsuccessful. Pt states that at one time she was told that her leads had become misplaced. Rep for this ER paged to problemshoot. Awaiting return call

## 2021-10-12 NOTE — H&P (Signed)
NAMETennie [facility], MRN:  [id number], DOB:  1929/02/17, LOS: 0 ADMISSION DATE:  10/11/2021 CONSULTATION DATE:  10/12/2021 REFERRING MD:  [facility] - EDP CHIEF COMPLAINT:  Hypotension   History of Present Illness:  88 year old woman who presented to [facility] 8/31 after a fall at home. Per EMS, notified by [facility]; patient was found down on kitchen floor, initially incoherent but becoming more lucid en route to ED. Bradycardic to 40s despite pacemaker, also with emesis x 3. PMHx significant for HTN, HLD, CAD (DES 2011), MI, CHF/ICM (Echo 2022 EF 45-50%, +RWMAs), PAF/CHB (s/p pacemaker placement), COPD, T2DM, CKD, GERD, hypothyroidism.  History obtained from patient, daughter Evelyn [facility]) via phone and chart review. Per patient, she had not been feeling well for a few days, stating "I feel like I was hit by a [place] truck". Reports feeling dizzy especially with standing and feeling at times like she needs to sit back down. Endorses usual PO intake (states ~6 small water bottles/day) and mild constipation which is her baseline. Denies fever/chills, CP/SOB, n/v/d, abdominal pain, HA. Denies swelling or difficulty urinating. Patient's daughter states that patient follows with [facility] and that they have made some recent medication changes; she is concerned that patient's dementia/confusion is worse than patient realizes and states that she often has to call her to remind her to take her medications. She is not sure of the accuracy of patient's reported PO intake, as patient drinks mostly coffee and [employer] (per daughter). She does note that patient was recently diagnosed earlier this week with a UTI.  In ED, patient was noted to be hypotensive with SBPs 90s-100s (MAPs 50s, given wide pulse pressure with diastolics 67J). Noted to be bradycardic occasionally to 40s while paced at 50. EKG demonstrating ?AF/Aflutter with complete AV block, pacing spikes noted. Labs notable for WBC 10.9, stable Hgb at baseline, Na 142, K  2.6, Cr 1.9 (baseline 1.25-1.4), Mg 1.1. Trop 504 (153). LA WNL. UA/UCx pending. CXR with cardiomegaly. CT Head/Cspine NAICA, +atrophy/chronic microvascular ischemic changes, degenerative changes of Cspine. Fluid resuscitation was initiated with initial improvement in SBP to 100s-110s; however, SBP continued to drop into 70s-80s requiring peripheral pressor initiation.  PCCM consulted for ICU admission in the setting of hypotension requiring pressor initiation.  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 09/2019 Echo: EF 45-50%, glob HK, mild conc LVH, mildly elev PASP. Sev dil LA. Mildly dil RA. Mild MR.   Anemia    Arthritis    Asthma    CAD (coronary artery disease)    a. 1995 s/p PCI/BMS LAD; b. 2011 s/p PCI/DES RCA.   CHF (congestive heart failure) (HCC)    Chronic kidney disease    COPD (chronic obstructive pulmonary disease) (HCC)    Depression    Diabetes mellitus without complication (HCC)    Diverticulitis    Dysrhythmia    GERD (gastroesophageal reflux disease)    Hyperlipidemia    Hypertension    Hypothyroid    pt unsure, not currently on medication   Ischemic cardiomyopathy    MI (myocardial infarction) ([place])    1995   Persistent atrial fibrillation ([place])    a. No OAC 2/2 h/o bleeding/anemia; b. Longterm amio therapy; c. 08/2019 - recurrent afib-->rate-controlled.   Presence of permanent cardiac pacemaker    Radiculopathy    Significant [facility] Events: Including procedures, antibiotic start and stop dates in addition to other  pertinent events   8/31 - Presented to [facility] ED via EMS after fall at home. Life Alert called EMS. CT Head/Cspine negative. Hypotensive with SBPs 90s, MAPs 50s. Fludis given without significant improvement. 9/1 - Pressors initiated peripherally. PCCM consulted for ICU admission.  Interim History / Subjective:  PCCM consulted for ICU admission in the  setting of hypotension/pressor initiation.  Objective:  Blood pressure (!) 82/40, pulse (!) 49, temperature (!) 97.5 F (36.4 C), temperature source Axillary, resp. rate 20, height _0  (1.499 m), weight 70 kg, SpO2 97 %.        Intake/Output Summary (Last 24 hours) at 10/12/2021 0202 Last data filed at 10/11/2021 2150 Gross per 24 hour  Intake 0 ml  Output 0 ml  Net 0 ml   Filed Weights   10/11/21 0121  Weight: 70 kg   Physical Examination: General: Acutely ill-appearing elderly woman in NAD. Appears fatigued. HEENT: [place]/AT, anicteric sclera, PERRL, dry mucous membranes. Neuro:  Awake, though drowsy. Doses off during conversaton. Protecting airway. Oriented to self/location/situation.  Responds to verbal stimuli. Following commands consistently. Moves all 4 extremities spontaneously. CV: Bradycardic paced at 50, no m/g/r. PULM: Breathing even and unlabored on 2LNC. Lung fields CTAB anteriorly. GI: Soft, nontender, nondistended. Normoactive bowel sounds. Extremities: No LE edema noted. Skin: Warm/dry, no rashes. Scattered small areas of ecchymosis noted to BUE.  Resolved Simmons Problem List:    Assessment & Plan:  Undifferentiated hypotension, suspect r/t hypovolemia/dehydration versus medication changes - Goal SBP > 100 - Fluid resuscitation as tolerated - Peripheral Levophed titrated to goal SBP - Trend WBC, fever curve, LA - F/u PCT - F/u Cx data - Continue ceftriaxone for empiric treatment of UTI  Recent UTI AKI on CKD Hypokalemia Hypomagnesemia Per daughter, patient recently diagnosed with UTI week of 0/16, uncertain of treatment. Patient has baseline CKD with Cr 1.2-1.4. - Trend BMP - Replete electrolytes as indicated - Monitor I&Os - Avoid nephrotoxic agents as able - Ensure adequate renal perfusion - Continue ceftriaxone for ?UTI, f/u UA/Cx  Bradycardia Complete AV block s/p pacemaker implantation PAF History of CAD s/p DES History of  MI CHF/ICM Echo 2022 EF 45-50%, +RWMAs, PAF/CHB (s/p pacemaker placement). - Cardiac monitoring - Pacemaker in place, monitor for capture - Trend troponin, f/u BNP - F/u Echo - Cardiology consult  - Continue ASA/statin - Hold diuretics in the setting of hypotension  COPD - Continue supplemental O2 support - Wean FiO2 for O2 sat > 90% - Bronchodilators as needed - Pulmonary hygiene  T2DM - SSI - CBGs Q4H - Goal CBG 140-180   GERD - PPI  Hypothyroidism - Resume levothyroxine if taking at home (confirm with [facility])  Best Practice: (right click and "Reselect all SmartList Selections" daily)   Diet/type: NPO w/ oral meds DVT prophylaxis: SCDs GI prophylaxis: PPI Lines: N/A Foley:  N/A Code Status:  full code Last date of multidisciplinary goals of care discussion [Pending - patient's daughter, Evelyn [facility], updated via phone 9/1AM ~0215]  Labs:  CBC: Recent Labs  Lab 10/11/21 2133  WBC 10.9*  NEUTROABS 9.0*  HGB 9.8*  HCT 31.1*  MCV 100.6*  PLT 553   Basic Metabolic Panel: Recent Labs  Lab 10/11/21 2133 10/12/21 0045  NA 142  --   K 2.6*  --   CL 102  --   CO2 21*  --   GLUCOSE 204*  --   BUN 60*  --   CREATININE 1.90*  --   CALCIUM 10.2  --  MG  --  1.1*   GFR: Estimated Creatinine Clearance: 16.7 mL/min (A) (by C-G formula based on SCr of 1.9 mg/dL (H)). Recent Labs  Lab 10/11/21 2133 10/12/21 0045  WBC 10.9*  --   LATICACIDVEN  --  1.5   Liver Function Tests: No results for input(s): "AST", "ALT", "ALKPHOS", "BILITOT", "PROT", "ALBUMIN" in the last 168 hours. No results for input(s): "LIPASE", "AMYLASE" in the last 168 hours. No results for input(s): "AMMONIA" in the last 168 hours.  ABG: No results found for: "PHART", "PCO2ART", "PO2ART", "HCO3", "TCO2", "ACIDBASEDEF", "O2SAT"   Coagulation Profile: Recent Labs  Lab 10/11/21 2133  INR 1.2   Cardiac Enzymes: No results for input(s): "CKTOTAL", "CKMB", "CKMBINDEX", "TROPONINI" in the last  168 hours.  HbA1C: Hgb A1c MFr Bld  Date/Time Value Ref Range Status  09/17/2019 03:39 PM 6.9 (H) 4.8 - 5.6 % Final    Comment:    (NOTE)         Prediabetes: 5.7 - 6.4         Diabetes: >6.4         Glycemic control for adults with diabetes: <7.0   05/12/2014 08:06 AM 6.5 4.6 - 6.5 % Final    Comment:    Glycemic Control Guidelines for People with Diabetes:Non Diabetic:  <6%Goal of Therapy: <7%Additional Action Suggested:  >8%    CBG: No results for input(s): "GLUCAP" in the last 168 hours.  Review of Systems:   Review of systems completed with pertinent positives/negatives outlined in above HPI.  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   Surgical History:   Past Surgical History:  Procedure Laterality Date   ABDOMINAL HYSTERECTOMY     APPENDECTOMY     AV NODE ABLATION N/A 12/13/2019   Procedure: AV NODE ABLATION;  Surgeon: Deboraha Sprang, MD;  Location: [facility];  Service: Cardiovascular;  Laterality: N/A;   CARDIAC CATHETERIZATION     COLONOSCOPY WITH PROPOFOL N/A 10/21/2019   Procedure: COLONOSCOPY WITH PROPOFOL;  Surgeon: Lin Landsman, MD;  Location: [facility] ENDOSCOPY;  Service: Gastroenterology;  Laterality: N/A;   CORONARY ANGIOPLASTY WITH STENT PLACEMENT  12/11/1993   RCA   ESOPHAGOGASTRODUODENOSCOPY (EGD) WITH PROPOFOL N/A 10/21/2019   Procedure: ESOPHAGOGASTRODUODENOSCOPY (EGD) WITH  PROPOFOL;  Surgeon: Lin Landsman, MD;  Location: [facility];  Service: Gastroenterology;  Laterality: N/A;   FOOT SURGERY Bilateral    Over 25 years ago- Bunionectomy   GIVENS CAPSULE STUDY N/A 10/22/2019   Procedure: GIVENS CAPSULE STUDY;  Surgeon: Lin Landsman, MD;  Location[facility];  Service: Gastroenterology;  Laterality: N/A;   INSERT / REPLACE / REMOVE PACEMAKER  09/2012   Model # C003  serial # [id number]   JOINT REPLACEMENT     bilateral knee replacements    Social History:   reports that she quit smoking about 27 years ago. Her smoking use included cigarettes. She has quit using smokeless tobacco. She reports current alcohol use. She reports that she does not use drugs.   Family History:  Her family history includes Arthritis in her father and mother; Cancer in  her daughter and sister; Diabetes in her brother, mother, and sister; Heart disease in her father and mother; Hypertension in her father and mother.   Allergies: Allergies  Allergen Reactions   Penicillins Nausea And Vomiting, Rash and Hives   Other Other (See Comments)   Sulfa Antibiotics     Pt unsure reaction, was told in [facility] she had allergy   Penicillin G Rash    Home Medications: Prior to Admission medications   Medication Sig Start Date End Date Taking? Authorizing Provider  acetaminophen (TYLENOL) 650 MG CR tablet SMARTSIG:1 Tablet(s) By Mouth Twice Daily 11/11/20   [provider]  aspirin EC 81 MG tablet Take 81 mg by mouth daily.    [provider]  bacitracin ointment Apply to right elbow with Band-Aid 11/07/20 11/07/21  Duanne Guess, PA-C  Blood Glucose Monitoring Suppl (ACCU-CHEK NANO SMARTVIEW) W/DEVICE KIT Please issue nano meter by AccuChek along with test strips at checking frequency of 2x daily. 90 day supply. E11.9 05/13/14   Phadke, Karsten Ro, MD  carboxymethylcellul-glycerin (OPTIVE) 0.5-0.9 % ophthalmic solution Place 1 drop into both eyes in the morning,  at noon, and at bedtime.    [provider]  cholecalciferol (VITAMIN D) 1000 units tablet Take 1,000 Units by mouth daily.    [provider]  diclofenac Sodium (VOLTAREN) 1 % GEL Apply 1 application topically 4 (four) times daily as needed (left knee pain.).     [provider]  DULoxetine (CYMBALTA) 30 MG capsule Take 30 mg by mouth daily.    [provider]  FEROSUL 325 (65 Fe) MG tablet Take by mouth. 11/11/20   [provider]  glucose blood (FREESTYLE LITE) test strip Use as instructed 05/30/14   Rubbie Battiest, RN  Lancets (FREESTYLE) lancets Use as instructed 05/30/14   Rubbie Battiest, RN  lidocaine (LIDODERM) 5 % Place 1 patch onto the skin daily as needed (knee pain.). Remove & Discard patch within 12 hours or as directed by MD    [provider]  loratadine (CLARITIN) 10 MG tablet Take 10 mg by mouth daily.    [provider]  metolazone (ZAROXOLYN) 2.5 MG tablet 2.5 mg every Monday, Wednesday, and Friday. 11/18/19   [provider]  MIRALAX 17 GM/SCOOP powder SMARTSIG:17 Gram(s) By Mouth Daily PRN 04/18/20   [provider]  Multiple Vitamins-Minerals (PRESERVISION AREDS 2+MULTI VIT) CAPS Take 1 capsule by mouth 2 (two) times daily.    [provider]  naloxone [facility]) nasal spray 4 mg/0.1 mL Place 1 spray into the nose See admin instructions. Administer 1 spray in one nostril. Repeat every 3 minutes if no response.    [provider]  nitroGLYCERIN (NITROSTAT) 0.4 MG SL tablet Place 0.4 mg under the tongue every 5 (five) minutes x 3 doses as needed for chest pain.     [provider]  nystatin (MYCOSTATIN/NYSTOP) powder Apply 1 application topically 4 (four) times daily as needed (fungal infection).    [provider]  nystatin-triamcinolone (MYCOLOG II) cream Apply 1 application topically 3 (three) times daily as needed (affected area(s)).    [provider]   pantoprazole (PROTONIX) 20 MG tablet Take 20 mg by mouth daily.    [provider]  polyethylene glycol (MIRALAX / GLYCOLAX) 17 g packet Take 17 g by mouth daily as needed (constipation.).     [provider]  potassium chloride (KLOR-CON) 20 MEQ packet Take 20 mEq by mouth daily. 12/04/20  [provider]  QUEtiapine (SEROQUEL) 25 MG tablet Take 25 mg by mouth at bedtime.     [provider]  rosuvastatin (CRESTOR) 40 MG tablet Take 1 tablet (40 mg total) by mouth daily. 05/09/14   Minna Merritts, MD  Sennosides-Docusate Sodium (SENEXON-S PO) Take 2 tablets by mouth daily as needed (constipation.).     [provider]  sodium chloride (OCEAN) 0.65 % nasal spray Place 1 spray into the nose 4 (four) times daily as needed for congestion (nasal dryness).    [provider]  torsemide (DEMADEX) 20 MG tablet Take 2 tablets (40 mg) by mouth once daily    [provider]  traZODone (DESYREL) 100 MG tablet Take 100 mg by mouth at bedtime.    [provider]  vitamin B-12 1000 MCG tablet Take 1 tablet (1,000 mcg total) by mouth daily. 10/11/19   Ezekiel Slocumb, DO   Critical care time: 40 minutes   Lestine Mount, PA-C [facility] 10/12/21 2:02 AM  Please see [email or web address] for pager details.  From 7A-7P if no response, please call [phone] After hours, please call ELink [phone]

## 2021-10-13 DIAGNOSIS — R6521 Severe sepsis with septic shock: Secondary | ICD-10-CM | POA: Diagnosis not present

## 2021-10-13 DIAGNOSIS — I2510 Atherosclerotic heart disease of native coronary artery without angina pectoris: Secondary | ICD-10-CM | POA: Diagnosis not present

## 2021-10-13 DIAGNOSIS — I950 Idiopathic hypotension: Secondary | ICD-10-CM | POA: Diagnosis not present

## 2021-10-13 DIAGNOSIS — N3000 Acute cystitis without hematuria: Secondary | ICD-10-CM | POA: Diagnosis not present

## 2021-10-13 DIAGNOSIS — A419 Sepsis, unspecified organism: Secondary | ICD-10-CM | POA: Diagnosis not present

## 2021-10-13 DIAGNOSIS — I4811 Longstanding persistent atrial fibrillation: Secondary | ICD-10-CM | POA: Diagnosis not present

## 2021-10-13 DIAGNOSIS — R778 Other specified abnormalities of plasma proteins: Secondary | ICD-10-CM

## 2021-10-13 DIAGNOSIS — I340 Nonrheumatic mitral (valve) insufficiency: Secondary | ICD-10-CM

## 2021-10-13 LAB — BASIC METABOLIC PANEL
Anion gap: 9 (ref 5–15)
BUN: 49 mg/dL — ABNORMAL HIGH (ref 8–23)
CO2: 25 mmol/L (ref 22–32)
Calcium: 9.7 mg/dL (ref 8.9–10.3)
Chloride: 104 mmol/L (ref 98–111)
Creatinine, Ser: 1.65 mg/dL — ABNORMAL HIGH (ref 0.44–1.00)
GFR, Estimated: 29 mL/min — ABNORMAL LOW (ref >60)
Glucose, Bld: 218 mg/dL — ABNORMAL HIGH (ref 70–99)
Potassium: 4 mmol/L (ref 3.5–5.1)
Sodium: 138 mmol/L (ref 135–145)

## 2021-10-13 LAB — GLUCOSE, CAPILLARY
Glucose-Capillary: 138 mg/dL — ABNORMAL HIGH (ref 70–99)
Glucose-Capillary: 127 mg/dL — ABNORMAL HIGH (ref 70–99)
Glucose-Capillary: 114 mg/dL — ABNORMAL HIGH (ref 70–99)
Glucose-Capillary: 152 mg/dL — ABNORMAL HIGH (ref 70–99)

## 2021-10-13 LAB — URINE CULTURE: Culture: NO GROWTH

## 2021-10-13 MED ORDER — HALOPERIDOL LACTATE 5 MG/ML IJ SOLN
5.0000 mg | Freq: Four times a day (QID) | INTRAMUSCULAR | Status: DC | PRN
Start: 2021-10-13 — End: 2021-10-18

## 2021-10-13 NOTE — Consult Note (Signed)
CARDIOLOGY CONSULT NOTE       Patient ID: Evelyn Simmons MRN: [id number] DOB/AGE: Dec 31, 1929 88 y.o.  Admit date: 10/11/2021 Referring Physician: Shearon Stalls Primary Physician: Dionicia Abler, NP (Inactive) Primary Cardiologist: Tresa Garter Reason for Consultation: Hypotension/MR  Principal Problem:   Hypotension Active Problems:   Septic shock ([place])   Acute cystitis without hematuria   HPI:  88 y.o. admitted with altered MS and hypotension ? Sepsis with UTI. History of afib with SSS PPM [employer] 2014 Has had recurrent GI bleeding not on anticoagulation Had AV nodal ablation for rate control 12/2019 Has had MI with stenting of RCA 2011 EF 45-50% Previous mild/moderate MR by TTE 12/2020. Chronic dyspnea previously on oxygen not currently Lives alone Daughter near by. Currently on pressors MS agitated does not want to wear oxygen and complaining that nurse is trying to kill her bathing her. No chest pain Lactate normal UA nitrite negative moderate leukocytes BNP 465 BUN 55 Cr 1.7 Hct 26.3 WBC 8.5 Troponin 504   No chest pain Telemetry with afib and V pacing ECG afib V pacing PVC;s  ROS All other systems reviewed and negative except as noted above  Past Medical History:  Diagnosis Date   (HFmrEF) heart failure with mid-range ejection fraction ([place])    a. 09/2019 Echo: EF 45-50%, glob HK, mild conc LVH, mildly elev PASP. Sev dil LA. Mildly dil RA. Mild MR.   Anemia    Arthritis    Asthma    CAD (coronary artery disease)    a. 1995 s/p PCI/BMS LAD; b. 2011 s/p PCI/DES RCA.   CHF (congestive heart failure) (HCC)    Chronic kidney disease    COPD (chronic obstructive pulmonary disease) (HCC)    Depression    Diabetes mellitus without complication (HCC)    Diverticulitis    Dysrhythmia    GERD (gastroesophageal reflux disease)    Hyperlipidemia    Hypertension    Hypothyroid    pt unsure, not currently on medication   Ischemic cardiomyopathy    MI (myocardial infarction)  ([place])    1995   Persistent atrial fibrillation ([place])    a. No OAC 2/2 h/o bleeding/anemia; b. Longterm amio therapy; c. 08/2019 - recurrent afib-->rate-controlled.   Presence of permanent cardiac pacemaker    Radiculopathy     Family History  Problem Relation Age of Onset   Arthritis Mother    Heart disease Mother    Hypertension Mother    Diabetes Mother    Arthritis Father    Heart disease Father    Hypertension Father    Cancer Sister        breast cancer   Diabetes Brother    Cancer Daughter        lung cancer   Diabetes Sister     Social History   Socioeconomic History   Marital status: Widowed    Spouse name: Not on file   Number of children: Not on file   Years of education: Not on file   Highest education level: Not on file  Occupational History   Not on file  Tobacco Use   Smoking status: Former    Types: Cigarettes    Quit date: 12/10/1993    Years since quitting: 27.8   Smokeless tobacco: Former  Scientific laboratory technician Use: Never used  Substance and Sexual Activity   Alcohol use: Yes    Alcohol/week: 0.0 standard drinks of alcohol    Comment: Rare   Drug use: No  Sexual activity: Not Currently  Other Topics Concern   Not on file  Social History Narrative   Moved from [place] with eldest daughter   7 children, 11 grandchildren, 74 GG children    Social Determinants of Health   Financial Resource Strain: Not on file  Food Insecurity: Not on file  Transportation Needs: Not on file  Physical Activity: Not on file  Stress: Not on file  Social Connections: Not on file  Intimate Partner Violence: Not on file    Past Surgical History:  Procedure Laterality Date   ABDOMINAL HYSTERECTOMY     APPENDECTOMY     AV NODE ABLATION N/A 12/13/2019   Procedure: AV NODE ABLATION;  Surgeon: Deboraha Sprang, MD;  Location: [facility];  Service: Cardiovascular;  Laterality: N/A;   CARDIAC CATHETERIZATION     COLONOSCOPY WITH PROPOFOL N/A 10/21/2019    Procedure: COLONOSCOPY WITH PROPOFOL;  Surgeon: Lin Landsman, MD;  Location: [facility] ENDOSCOPY;  Service: Gastroenterology;  Laterality: N/A;   CORONARY ANGIOPLASTY WITH STENT PLACEMENT  12/11/1993   RCA   ESOPHAGOGASTRODUODENOSCOPY (EGD) WITH PROPOFOL N/A 10/21/2019   Procedure: ESOPHAGOGASTRODUODENOSCOPY (EGD) WITH PROPOFOL;  Surgeon: Lin Landsman, MD;  Location: [facility];  Service: Gastroenterology;  Laterality: N/A;   FOOT SURGERY Bilateral    Over 25 years ago- Bunionectomy   GIVENS CAPSULE STUDY N/A 10/22/2019   Procedure: GIVENS CAPSULE STUDY;  Surgeon: Lin Landsman, MD;  Location: [facility];  Service: Gastroenterology;  Laterality: N/A;   INSERT / REPLACE / REMOVE PACEMAKER  09/2012   Model # Y659  serial # [id number]   JOINT REPLACEMENT     bilateral knee replacements      Current Facility-Administered Medications:    0.9 %  sodium chloride infusion, 250 mL, Intravenous, Continuous, Scheving, Livingston Diones, MD, Last Rate: 10 mL/hr at 10/13/21 0600, Infusion Verify at 10/13/21 0600   aspirin EC tablet 81 mg, 81 mg, Oral, Daily, [place] Crane M, PA-C, 81 mg at 10/12/21 9357   cefTRIAXone (ROCEPHIN) 1 g in sodium chloride 0.9 % 100 mL IVPB, 1 g, Intravenous, Q24H, Reese, Stephanie M, PA-C, Stopped at 10/13/21 0006   Chlorhexidine Gluconate Cloth 2 % PADS 6 each, 6 each, Topical, Q0600, Hunsucker, Bonna Gains, MD, 6 each at 10/12/21 0239   docusate sodium (COLACE) capsule 100 mg, 100 mg, Oral, BID PRN, Ayesha Rumpf, Stephanie M, PA-C   DULoxetine (CYMBALTA) DR capsule 30 mg, 30 mg, Oral, Daily, [place] Crane M, PA-C, 30 mg at 10/12/21 1020   heparin injection 5,000 Units, 5,000 Units, Subcutaneous, Q8H, Reese, Stephanie M, PA-C, 5,000 Units at 10/12/21 2104   insulin aspart (novoLOG) injection 0-9 Units, 0-9 Units, Subcutaneous, Q4H, [place] Crane M, PA-C, 3 Units at 10/13/21 0017   loratadine (CLARITIN) tablet 10 mg, 10 mg, Oral, Daily PRN, [place] Crane M,  PA-C   norepinephrine (LEVOPHED) 33m in 2581m(0.016 mg/mL) premix infusion, 2-10 mcg/min, Intravenous, Titrated, ReAyesha RumpfStephanie M, PA-C, Last Rate: 22.5 mL/hr at 10/13/21 0600, 6 mcg/min at 10/13/21 0600   Oral care mouth rinse, 15 mL, Mouth Rinse, PRN, Hunsucker, MaBonna GainsMD   pantoprazole (PROTONIX) EC tablet 20 mg, 20 mg, Oral, Daily, ReNevada Crane, PA-C, 20 mg at 10/12/21 1021   polyethylene glycol (MIRALAX / GLYCOLAX) packet 17 g, 17 g, Oral, Daily PRN, ReNevada Crane, PA-C   polyvinyl alcohol (LIQUIFILM TEARS) 1.4 % ophthalmic solution 1 drop, 1 drop, Both Eyes, TID, ReAyesha RumpfStephanie M, PA-C, 1  drop at 10/12/21 2000   rosuvastatin (CRESTOR) tablet 40 mg, 40 mg, Oral, Daily, [place] Crane M, PA-C, 40 mg at 10/12/21 1610   sodium chloride (OCEAN) 0.65 % nasal spray 1 spray, 1 spray, Nasal, QID PRN, Lestine Mount, PA-C  aspirin EC  81 mg Oral Daily   Chlorhexidine Gluconate Cloth  6 each Topical Q0600   DULoxetine  30 mg Oral Daily   heparin  5,000 Units Subcutaneous Q8H   insulin aspart  0-9 Units Subcutaneous Q4H   pantoprazole  20 mg Oral Daily   polyvinyl alcohol  1 drop Both Eyes TID   rosuvastatin  40 mg Oral Daily    sodium chloride 10 mL/hr at 10/13/21 0600   cefTRIAXone (ROCEPHIN)  IV Stopped (10/13/21 0006)   norepinephrine (LEVOPHED) Adult infusion 6 mcg/min (10/13/21 0600)    Physical Exam: Blood pressure (!) 111/44, pulse (!) 50, temperature 98 F (36.7 C), temperature source Oral, resp. rate 15, height _0  (1.499 m), weight 70.8 kg, SpO2 98 %.    Agitated elderly female Basilar crackles PPM under left clavicle MR and AV sclerosis murmurs Abdomen benign Trace edema  Labs:   Lab Results  Component Value Date   WBC 8.5 10/12/2021   HGB 8.6 (L) 10/12/2021   HCT 26.3 (L) 10/12/2021   MCV 97.4 10/12/2021   PLT 293 10/12/2021    Recent Labs  Lab 10/13/21 0026  NA 138  K 4.0  CL 104  CO2 25  BUN 49*  CREATININE 1.65*  CALCIUM 9.7   GLUCOSE 218*   No results found for: "CKTOTAL", "CKMB", "CKMBINDEX", "TROPONINI"  Lab Results  Component Value Date   CHOL 122 09/18/2019   CHOL 184 05/12/2014   CHOL 191 06/03/2013   Lab Results  Component Value Date   HDL 34 (L) 09/18/2019   HDL 42.10 05/12/2014   HDL 50 06/03/2013   Lab Results  Component Value Date   LDLCALC 70 09/18/2019   LDLCALC 113 (H) 05/12/2014   LDLCALC 101 06/03/2013   Lab Results  Component Value Date   TRIG 89 09/18/2019   TRIG 144.0 05/12/2014   TRIG 121 06/03/2013   Lab Results  Component Value Date   CHOLHDL 3.6 09/18/2019   CHOLHDL 4 05/12/2014   No results found for: "LDLDIRECT"    Radiology: ECHOCARDIOGRAM COMPLETE  Result Date: 10/12/2021    ECHOCARDIOGRAM REPORT   Patient Name:   EMILEA GOGA Date of Exam: 10/12/2021 Medical Rec #:  [id number]     Height:       59.0 in Accession #:    [id number]    Weight:       147.7 lb Date of Birth:  [birth year]      BSA:          1.621 m Patient Age:    37 years      BP:           116/44 mmHg Patient Gender: F             HR:           58 bpm. Exam Location:  Inpatient Procedure: 2D Echo, Cardiac Doppler, Color Doppler and Intracardiac            Opacification Agent                                MODIFIED REPORT:  This report was modified by Nadean Corwin  Margaretann Loveless MD on 10/12/2021 due to report send                                     to EMR.  Indications:     Cardiomyopathy  History:         Patient has prior history of Echocardiogram examinations, most                  recent 12/12/2020. CHF, CAD, Pacemaker, Arrythmias:Atrial                  Fibrillation; Risk Factors:Diabetes, Dyslipidemia and Former                  Smoker.  Sonographer:     Clayton Lefort RDCS (AE) Referring Phys:  [id number] Lowella Dell REESE Diagnosing Phys: Cherlynn Kaiser MD  Sonographer Comments: Technically difficult study due to poor echo windows and no subcostal window obtained. IMPRESSIONS  1. The mitral valve is degenerative. Severe mitral  valve regurgitation, posteriorly directed with posterior leaflet restriction and anterior leaflet override. No evidence of mitral stenosis. Severe mitral annular calcification.  2. Left ventricular ejection fraction, by estimation, is 45 to 50%. The left ventricle has mildly decreased function. The left ventricle demonstrates regional wall motion abnormalities (see scoring diagram/findings for description). Left ventricular diastolic parameters are indeterminate.  3. Right ventricular systolic function is mildly reduced. The right ventricular size is moderately enlarged.  4. Left atrial size was severely dilated.  5. The aortic valve is abnormal. There is severe calcifcation of the aortic valve. Aortic valve regurgitation is trivial. Mild aortic valve stenosis. Aortic valve mean gradient measures 8.0 mmHg. FINDINGS  Left Ventricle: Left ventricular ejection fraction, by estimation, is 45 to 50%. The left ventricle has mildly decreased function. The left ventricle demonstrates regional wall motion abnormalities. Definity contrast agent was given IV to delineate the left ventricular endocardial borders. The left ventricular internal cavity size was normal in size. There is no left ventricular hypertrophy. Left ventricular diastolic parameters are indeterminate.  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 The basal anteroseptal segment and basal inferoseptal segment are akinetic. Right Ventricle: The right ventricular size is moderately enlarged. No increase in right ventricular wall thickness. Right ventricular systolic function is mildly reduced. Left Atrium: Left atrial size was severely dilated. Right Atrium: Right atrial size was normal in size. Pericardium: There is no evidence of pericardial effusion. Mitral Valve: The mitral valve is degenerative in appearance. Severe mitral annular calcification. Severe mitral valve regurgitation. No evidence of mitral valve stenosis. The mean mitral valve  gradient is 2.7 mmHg. Tricuspid Valve: The tricuspid valve is normal in structure. Tricuspid valve regurgitation is not demonstrated. No evidence of tricuspid stenosis. Aortic Valve: The aortic valve is abnormal. There is severe calcifcation of the aortic valve. Aortic valve regurgitation is trivial. Mild aortic stenosis is present. Aortic valve mean gradient measures 8.0 mmHg. Aortic valve peak gradient measures 15.7 mmHg. Aortic valve area, by VTI measures 0.92 cm. Pulmonic Valve: The pulmonic valve was normal in structure. Pulmonic valve regurgitation is trivial. No evidence of pulmonic stenosis. Aorta: The aortic root is normal in size and structure. Venous: The inferior vena cava was not well visualized. IAS/Shunts: The interatrial septum was not assessed.  LEFT VENTRICLE PLAX 2D LVIDd:         4.70 cm LVIDs:         3.40  cm LV PW:         1.20 cm LV IVS:        0.84 cm LVOT diam:     1.90 cm LV SV:         40 LV SV Index:   25 LVOT Area:     2.84 cm  RIGHT VENTRICLE RV Basal diam:  4.20 cm RV Mid diam:    3.60 cm RV S prime:     7.62 cm/s TAPSE (M-mode): 2.0 cm LEFT ATRIUM              Index        RIGHT ATRIUM           Index LA diam:        5.70 cm  3.52 cm/m   RA Area:     20.70 cm LA Vol (A2C):   161.0 ml 99.29 ml/m  RA Volume:   51.00 ml  31.45 ml/m LA Vol (A4C):   144.0 ml 88.81 ml/m LA Biplane Vol: 156.0 ml 96.21 ml/m  AORTIC VALVE AV Area (Vmax):    1.01 cm AV Area (Vmean):   0.92 cm AV Area (VTI):     0.92 cm AV Vmax:           198.00 cm/s AV Vmean:          130.000 cm/s AV VTI:            0.435 m AV Peak Grad:      15.7 mmHg AV Mean Grad:      8.0 mmHg LVOT Vmax:         70.30 cm/s LVOT Vmean:        42.000 cm/s LVOT VTI:          0.141 m LVOT/AV VTI ratio: 0.32  AORTA Ao Root diam: 2.90 cm Ao Asc diam:  3.70 cm MITRAL VALVE MV Mean grad: 2.7 mmHg SHUNTS                        Systemic VTI:  0.14 m                        Systemic Diam: 1.90 cm Cherlynn Kaiser MD Electronically signed by  Cherlynn Kaiser MD Signature Date/Time: 10/12/2021/5:35:26 PM    Final (Updated)    CT Head Wo Contrast  Result Date: 10/11/2021 CLINICAL DATA:  Fall, head and neck trauma. EXAM: CT HEAD WITHOUT CONTRAST CT CERVICAL SPINE WITHOUT CONTRAST TECHNIQUE: Multidetector CT imaging of the head and cervical spine was performed following the standard protocol without intravenous contrast. Multiplanar CT image reconstructions of the cervical spine were also generated. RADIATION DOSE REDUCTION: This exam was performed according to the departmental dose-optimization program which includes automated exposure control, adjustment of the mA and/or kV according to patient size and/or use of iterative reconstruction technique. COMPARISON:  04/26/2021. FINDINGS: CT HEAD FINDINGS Brain: No acute intracranial hemorrhage, midline shift or mass effect. No extra-axial fluid collection. Diffuse atrophy is noted. Subcortical and periventricular white matter hypodensities are present bilaterally. No hydrocephalus. Vascular: Atherosclerotic calcification of the carotid siphons and vertebral arteries. No hyperdense vessel. Skull: No acute fracture. Sinuses/Orbits: No acute finding. Other: None. CT CERVICAL SPINE FINDINGS Alignment: Mild anterolisthesis at C5-C6 and C7-T1. Skull base and vertebrae: No acute fracture. Soft tissues and spinal canal: No prevertebral fluid or swelling. No visible canal hematoma. Disc levels: Multilevel intervertebral disc space narrowing, uncovertebral osteophyte formation,  and facet arthropathy. Upper chest: Emphysematous changes in the lungs. Mild dependent atelectasis bilaterally. Aortic atherosclerosis. Other: Carotid artery calcifications. IMPRESSION: 1. No acute intracranial process. 2. Atrophy with chronic microvascular ischemic changes. 3. Multilevel degenerative changes in the cervical spine without evidence of acute fracture. Electronically Signed   By: Brett Fairy M.D.   On: 10/11/2021 22:02   CT  Cervical Spine Wo Contrast  Result Date: 10/11/2021 CLINICAL DATA:  Fall, head and neck trauma. EXAM: CT HEAD WITHOUT CONTRAST CT CERVICAL SPINE WITHOUT CONTRAST TECHNIQUE: Multidetector CT imaging of the head and cervical spine was performed following the standard protocol without intravenous contrast. Multiplanar CT image reconstructions of the cervical spine were also generated. RADIATION DOSE REDUCTION: This exam was performed according to the departmental dose-optimization program which includes automated exposure control, adjustment of the mA and/or kV according to patient size and/or use of iterative reconstruction technique. COMPARISON:  04/26/2021. FINDINGS: CT HEAD FINDINGS Brain: No acute intracranial hemorrhage, midline shift or mass effect. No extra-axial fluid collection. Diffuse atrophy is noted. Subcortical and periventricular white matter hypodensities are present bilaterally. No hydrocephalus. Vascular: Atherosclerotic calcification of the carotid siphons and vertebral arteries. No hyperdense vessel. Skull: No acute fracture. Sinuses/Orbits: No acute finding. Other: None. CT CERVICAL SPINE FINDINGS Alignment: Mild anterolisthesis at C5-C6 and C7-T1. Skull base and vertebrae: No acute fracture. Soft tissues and spinal canal: No prevertebral fluid or swelling. No visible canal hematoma. Disc levels: Multilevel intervertebral disc space narrowing, uncovertebral osteophyte formation, and facet arthropathy. Upper chest: Emphysematous changes in the lungs. Mild dependent atelectasis bilaterally. Aortic atherosclerosis. Other: Carotid artery calcifications. IMPRESSION: 1. No acute intracranial process. 2. Atrophy with chronic microvascular ischemic changes. 3. Multilevel degenerative changes in the cervical spine without evidence of acute fracture. Electronically Signed   By: Brett Fairy M.D.   On: 10/11/2021 22:02   DG Chest Portable 1 View  Result Date: 10/11/2021 CLINICAL DATA:  Fall trauma  EXAM: PORTABLE CHEST 1 VIEW COMPARISON:  04/26/2021 FINDINGS: Left-sided pacing device as before. Cardiomegaly with central vascular congestion. No acute airspace disease, pleural effusion or pneumothorax. Aortic atherosclerosis. IMPRESSION: Cardiomegaly with central vascular congestion. Electronically Signed   By: Donavan Foil M.D.   On: 10/11/2021 21:45    EKG: See HPI   ASSESSMENT AND PLAN:   Hypotension:  likely low output with EF 45-50% and MR ? Sepsis on levophed and ceftriaxone hold diuretics with azoemia Urine culture pending MR:  review of echo from yesterday and 12/2020 MR appears only moderate to me She is not a candidate for mitral clip given age and morphology Has severe MAC with short posterior leaflet and subchordal calcium. MR combination of ischemic, primary with MAC and atrial FMR  Afib:  rate fine post AV node ablation not on anticoagulation due to recurrent GI bleed anemia and age with fall risk  PPM:  normal function Chemical engineer  CAD:  ECG non diagnostic due to pacing No chest pain Mild elevation in troponin no indication for cath continue ASA/Statin   Overall cardiology has nothing to add Consider goals of care discussion Not a candidate for mitral clip or Watchman   Signed: Jenkins Rouge 10/13/2021, 9:26 AM

## 2021-10-13 NOTE — Progress Notes (Signed)
Elink notified that patient is refusing to wear oxygen and get her vitals taken even though she is on Levo. Patient is yelling at staff and being combative when woke up to put oxygen back on or check BP. Patients last BP was at 0220 and was 118/32 (56), current HR 66 Vpaced, RR-26 and SpO2=78.

## 2021-10-13 NOTE — Progress Notes (Signed)
Patient refused bath. Will try again later

## 2021-10-13 NOTE — Progress Notes (Signed)
eLink Physician-Brief Progress Note Patient Name: Mauricia Mertens DOB: 01/22/1930 MRN: [id number]   Date of Service  10/13/2021  HPI/Events of Note  Patient refusing oxygen and BP checks Currently on levophed 6 SpO2 in the 40s RN has attempted multiple time to re-orient the patient  Patient yelling to be left alone  eICU Interventions  Discussed with RN Continue to obtain BP check as able Make attempts to replace nasal cannula as able Suspect this is related to baseline dementia - day team to address [place]     Intervention Category Minor Interventions: Agitation / anxiety - evaluation and management  Lurae Hornbrook Rodman Pickle 10/13/2021, 3:47 AM

## 2021-10-13 NOTE — Progress Notes (Signed)
Patient refusing to keep her oxygen on stating "I don't need oxygen!" Sats dropped to 83%. Patient refusing to let me get a BP on her also even though she is on levophed. Patient refusing care continuously. Tried to redirect her but this didn't work. Monitoring patient.

## 2021-10-13 NOTE — Progress Notes (Signed)
Patient allowed me to place her oxygen (3 L [place]) back on and her SpO2 increased to 98% from 72%. Patient still refusing to let me check a Blood pressure.

## 2021-10-13 NOTE — Progress Notes (Signed)
Patient climbing out of bed, demanding to go to restroom. Refuses to use bedpan or bedside commode. Combative, hitting and slapping staff. Pulled off oxygen. Transferred to bathroom with x2 assist. This RN and Education officer, environmental. Patient more cooperative once on toilet, allowed to place back on oxygen. Dr. Shearon Stalls made aware and came to bedside. Haldol order placed. Patient transferred back to bed with no issues. VSS. Will continue to monitor.

## 2021-10-13 NOTE — Progress Notes (Signed)
Patient refused bath for a second time.

## 2021-10-13 NOTE — Progress Notes (Signed)
NAMEToy Samarin, MRN:  [id number], DOB:  18-Sep-1929, LOS: 1 ADMISSION DATE:  10/11/2021, CONSULTATION DATE: 10/12/2021 REFERRING MD:  EDP, CHIEF COMPLAINT:  Hypotension   History of Present Illness:  88 year old woman who presented to [facility] ED 8/31 after a fall at home. Per EMS, notified by [facility]; patient was found down on kitchen floor, initially incoherent but becoming more lucid en route to ED. Bradycardic to 40s despite pacemaker, also with emesis x 3.   Per chart review patient had a few days where she wasn't feeling well with decreased PO intake and was recently diagnosed with UTI earlier this week and prescribed macrobid on 8/29 which she got 2-3 doses of per [facility]. She is unsure if she took this or not. In ED was hypotensive and MAPs of 50s with wide pulse pressure. She was given two 500 mL LR boluses in which her pressures were responsive to at first but continued to drop into 70s-80s requiring peripheral pressor initiation.  Pertinent  Medical History  PMHx significant for HTN, HLD, CAD (DES 2011), MI, CHF/ICM (Echo 2022 EF 45-50%, +RWMAs), PAF/CHB (s/p pacemaker placement), COPD, T2DM, CKD, GERD, hypothyroidism.  Significant Hospital Events: Including procedures, antibiotic start and stop dates in addition to other pertinent events   8/31 Presented to [facility] ED via EMS after fall at home. [facility] called EMS. CT Head/Cspine negative. Hypotension with SBPs in 90s and MAPs in 50s. Fluids given without significant improvement 9/1 Pressors initiated peripherally. PCCM consulted for ICU admission. 9/1 CTX started for UTI 9/1 Troponin elevation to 504   Interim History / Subjective:  Overnight had some sun-downing, refusing BP check, oxygen. HR has intermittently dipped below 50 BPM. Still on levophed 6 mcg.   Objective   Blood pressure (!) 111/44, pulse (!) 50, temperature 98 F (36.7 C), temperature source Oral, resp. rate 15, height 4\' 11"  (1.499 m), weight 70.8 kg, SpO2 98 %.         Intake/Output Summary (Last 24 hours) at 10/13/2021 0928 Last data filed at 10/13/2021 0600 Gross per 24 hour  Intake 2709 ml  Output 445 ml  Net 2264 ml   Filed Weights   10/12/21 0306 10/13/21 0000 10/13/21 0224  Weight: 67 kg 70.8 kg 70.8 kg    Examination: Elderly woman, resting comfortably no distress RRR, paced in 50s.  No edema Abdomen soft On nasal cannula with non-labored breathing.   Resolved Hospital Problem list    Hypokalemia Hypomagnesemia  Assessment & Plan:  Shock - undifferentiated - she has been treated adequate for sepsis at this point with abx and fluid resuscitation. Given her failure to improve I am suspecting chronotropic incompetence as the etiology for her persistent hypotension - will consult cardiology to see if anything should be done about her PM rate.  - follow culture data - continue abx empirically for UTI  SSS s/p AVN ablation and PPM placement PAF Hx of CAD s/p DES Hx of MI CHF/ICM Elevated Troponin Echo 2022 with EF 45-50%, +RWMAs, PAF/CHB s/p pacemaker placement. Troponin elevated to 504 from 28-asymptomatic, no acute ischemic changes on EKG, likely demand ischemia but continue to monitor -cardiac monitoring -pacemaker in place, monitor for capture -trend trops -EKG prn -F/u Echo -Cardiology consulted -Continue ASA/statin -Hold diuretics in setting of hypotension  AKI on CKD - recent outpatient treatment of UTI -Trend BMP -Replete electrolytes as indicated -Monitor I&Os -Avoid nephrotoxic agents as able -Ensure adequate renal perfusion -Continue CTX for possible UTI > f/u Ucx  COPD  Hx of requiring 2 L of oxygen at night only per [facility]. -continue supplemental O2 prn -Monitor FiO2  -Pulmonary hygeine  T2DM -SSI -CBGs q4h -Goal CBG 140-180  GERD -PPI  Hypothyroidism  -Resume levothyroxine if taking at home - ([facility] did not say she is taking this)  Best Practice (right click and "Reselect all SmartList  Selections" daily)   Diet/type: dysphagia diet (see orders) DVT prophylaxis: systemic heparin GI prophylaxis: PPI Lines: N/A Foley:  N/A Code Status:  full code Last date of multidisciplinary goals of care discussion [9/1 0215 AM Polly-daughter]   Critical care time: 45 minutes     The patient is critically ill due to shock.  Critical care was necessary to treat or prevent imminent or life-threatening deterioration.  Critical care was time spent personally by me on the following activities: development of treatment plan with patient and/or surrogate as well as nursing, discussions with consultants, evaluation of patient's response to treatment, examination of patient, obtaining history from patient or surrogate, ordering and performing treatments and interventions, ordering and review of laboratory studies, ordering and review of radiographic studies, pulse oximetry, re-evaluation of patient's condition and participation in multidisciplinary rounds.   Critical Care Time devoted to patient care services described in this note is 45 minutes. This time reflects time of care of this [place] . This critical care time does not reflect separately billable procedures or procedure time, teaching time or supervisory time of PA/NP/Med student/Med Resident etc but could involve care discussion time.       Spero Geralds [facility] 10/13/2021 9:28 AM  Pager: see AMION  If no response to pager , please call critical care on call (see AMION) until 7pm After 7:00 pm call Elink

## 2021-10-13 NOTE — Progress Notes (Signed)
Patient becoming combative when I tried to put on her blood pressure cuff. Patient states "your not putting that on my arms." Explained to patient that she is on a blood pressure medicine IV that increases her BP therefore, we need to check it regularly. Patient stated, "she doesn't care." Continuing to monitor patient.

## 2021-10-14 ENCOUNTER — Other Ambulatory Visit: Payer: Self-pay

## 2021-10-14 DIAGNOSIS — I950 Idiopathic hypotension: Secondary | ICD-10-CM

## 2021-10-14 DIAGNOSIS — N3000 Acute cystitis without hematuria: Secondary | ICD-10-CM | POA: Diagnosis not present

## 2021-10-14 DIAGNOSIS — A419 Sepsis, unspecified organism: Secondary | ICD-10-CM | POA: Diagnosis not present

## 2021-10-14 DIAGNOSIS — K219 Gastro-esophageal reflux disease without esophagitis: Secondary | ICD-10-CM | POA: Diagnosis not present

## 2021-10-14 LAB — BASIC METABOLIC PANEL
Anion gap: 7 (ref 5–15)
BUN: 47 mg/dL — ABNORMAL HIGH (ref 8–23)
CO2: 25 mmol/L (ref 22–32)
Calcium: 9.9 mg/dL (ref 8.9–10.3)
Chloride: 103 mmol/L (ref 98–111)
Creatinine, Ser: 1.81 mg/dL — ABNORMAL HIGH (ref 0.44–1.00)
GFR, Estimated: 26 mL/min — ABNORMAL LOW (ref >60)
Glucose, Bld: 184 mg/dL — ABNORMAL HIGH (ref 70–99)
Potassium: 4.2 mmol/L (ref 3.5–5.1)
Sodium: 135 mmol/L (ref 135–145)

## 2021-10-14 LAB — GLUCOSE, CAPILLARY
Glucose-Capillary: 111 mg/dL — ABNORMAL HIGH (ref 70–99)
Glucose-Capillary: 116 mg/dL — ABNORMAL HIGH (ref 70–99)
Glucose-Capillary: 129 mg/dL — ABNORMAL HIGH (ref 70–99)
Glucose-Capillary: 138 mg/dL — ABNORMAL HIGH (ref 70–99)
Glucose-Capillary: 142 mg/dL — ABNORMAL HIGH (ref 70–99)
Glucose-Capillary: 177 mg/dL — ABNORMAL HIGH (ref 70–99)

## 2021-10-14 LAB — MAGNESIUM: Magnesium: 2.4 mg/dL (ref 1.7–2.4)

## 2021-10-14 MED ORDER — ACETAMINOPHEN 325 MG PO TABS
650.0000 mg | ORAL_TABLET | Freq: Four times a day (QID) | ORAL | Status: DC | PRN
  Administered 2021-10-14: 650 mg via ORAL
  Filled 2021-10-14: qty 2

## 2021-10-14 MED ORDER — SODIUM CHLORIDE 0.9 % IV SOLN: INTRAVENOUS | Status: DC

## 2021-10-14 MED ORDER — INSULIN ASPART 100 UNIT/ML IJ SOLN
0.0000 [IU] | Freq: Three times a day (TID) | INTRAMUSCULAR | Status: DC
  Administered 2021-10-14: 2 [IU] via SUBCUTANEOUS
  Administered 2021-10-15 – 2021-10-16 (×3): 1 [IU] via SUBCUTANEOUS
  Administered 2021-10-18: 2 [IU] via SUBCUTANEOUS

## 2021-10-14 NOTE — Plan of Care (Signed)
  Problem: Education: Goal: Knowledge of General Education information will improve Description: Including pain rating scale, medication(s)/side effects and non-pharmacologic comfort measures Outcome: Progressing   Problem: Health Behavior/Discharge Planning: Goal: Ability to manage health-related needs will improve Outcome: Progressing   Problem: Activity: Goal: Risk for activity intolerance will decrease Outcome: Progressing   

## 2021-10-14 NOTE — Progress Notes (Signed)
Patient complains of pain in the back of her head and requests Tylenol. Provider notified.

## 2021-10-14 NOTE — Progress Notes (Addendum)
PROGRESS NOTE    Evelyn Simmons  KGY:[id number] DOB: 1929/05/03 DOA: 10/11/2021 PCP: Dionicia Abler, NP (Inactive)   Chief Complaint  Patient presents with   Fall    Brief Narrative:  Patient is a 88 year old female presented to the ED after a fall at home EMS notified by life alert found down on the kitchen floor initially incoherent became more lucid around, noted to be bradycardic to the 40s s/p PPM and also noted to have emesis x3.  Patient noted to have had a few day history of decreased oral intake, recently diagnosed with UTI and prescribed Macrobid which she got 2-3 doses per [facility].  Patient presented to the ED noted to be hypotensive with MAP of 50s and wide pulse pressure, received IV fluids with no improvement with BP requiring pressor initiation.  Urinalysis done concerning for UTI.  Patient placed empirically on IV antibiotics.  Blood pressure improved patient transferred out of the unit.  Cardiology consulted who assessed patient, reviewed 2D echo and felt nothing further to add from cardiology standpoint and recommending consideration for goals of care.   Assessment & Plan:   Principal Problem:   Hypotension Active Problems:   Septic shock (HCC)   Acute cystitis without hematuria  #1 septic shock likely secondary to UTI, POA -Patient admitted treated for sepsis with septic shock requiring fluid resuscitation and pressor support. -Urinalysis done concerning for UTI -Urine cultures with no growth to date. -Patient initially required pressors however has been weaned off pressors. -Concern for chronotropic incompetence as well for persistent hypotension and cardiology consulted with nothing further to add at this time. -Continue empiric IV antibiotics and can subsequently be transitioned in the next 1 to 2 days to oral antibiotics to complete a 5 to 7-day course of treatment.  2.  SSS status post AVN ablation and PPM placement/PAF/history of CAD status post DES/history of  MI/CHF/ICM/elevated troponin -Patient noted by 2D echo of 2022 with a EF of 45 to 50%, positive RWMA, PAF/CHB status post pacemaker placement noted to have elevated troponin on admission however patient remained asymptomatic. -EKG with no ischemic changes noted. -Elevated troponin felt likely secondary to demand ischemia per PCCM. -Repeat 2D echo done with EF of 45 to 50%, mildly reduced right ventricular systolic function, left ventricle with regional wall motion abnormalities, severely dilated left atrial size, severe MVR, mild aortic valve stenosis. -Patient seen in consultation by cardiology with nothing further to add at this time and cardiology recommending evaluation for goals of care. -We will consult with palliative care for goals of care.  3 AKI on CKD stage IIIb -Patient with recent outpatient treatment for UTI. -Avoid nephrotoxic agents. -Patient initially hydrated on admission which is subsequently been discontinued. -Continue treatment for UTI. -Renal function fluctuating. -We will place back on gentle hydration for the next 24 hours. -Follow-up.  4.  COPD -Patient noted to require 2 L O2 at night only per [facility]. -Continue supplemental O2 as needed. -Pulmonary hygiene.  5.  Diabetes mellitus type 2 -Hemoglobin A1c 6.8 (10/12/2021) -CBG 129 this morning. -Change CBGs to before meals and at bedtime.  6.  GERD -PPI.  7.  ?? Hypothyroidism -Synthroid not noted on MAR. Check TSH in AM,. -Outpatient follow-up with PCP.   DVT prophylaxis: Heparin Code Status: Full Family Communication: Updated daughter on the phone.  Disposition: TBD.  Hopefully home with home health.  Status is: Inpatient Remains inpatient appropriate because: Severity of illness   Consultants:  Admitted to PCCM. Cardiology: Dr. Johnsie Cancel  10/13/2020  Procedures:  CT head CT C-spine 10/11/2021 Chest x-ray 10/11/2021 2D echo 10/12/2021   Significant Hospital Events: Including procedures, antibiotic  start and stop dates in addition to other pertinent events   8/31 Presented to [facility] ED via EMS after fall at home. [facility] called EMS. CT Head/Cspine negative. Hypotension with SBPs in 90s and MAPs in 50s. Fluids given without significant improvement 9/1 Pressors initiated peripherally. PCCM consulted for ICU admission. 9/1 CTX started for UTI 9/1 Troponin elevation to 504   Antimicrobials:  IV Rocephin 10/12/2021>>>>>   Subjective: Patient sleeping but arousable.  Denies any chest pain.  No shortness of breath.  No abdominal pain.  Following some commands.  States she lives at home and has 2 aides that come by to help.  Objective: Vitals:   10/13/21 2346 10/14/21 0500 10/14/21 0913 10/14/21 1105  BP: (!) 99/37  (!) 114/38 (!) 107/54  Pulse: (!) 50  (!) 50 (!) 50  Resp: 20  18 18   Temp: 98.1 F (36.7 C)  98.1 F (36.7 C) 98 F (36.7 C)  TempSrc: Oral  Oral Oral  SpO2: 99%  96% 99%  Weight:  70 kg    Height:  4\' 11"  (1.499 m)      Intake/Output Summary (Last 24 hours) at 10/14/2021 1115 Last data filed at 10/14/2021 0900 Gross per 24 hour  Intake 950 ml  Output --  Net 950 ml   Filed Weights   10/13/21 0224 10/13/21 1634 10/14/21 0500  Weight: 70.8 kg 70.2 kg 70 kg    Examination:  General exam: Appears calm and comfortable.  Dry mucous membranes. Respiratory system: Clear to auscultation.  No wheezes, no crackles, no rhonchi.  Respiratory effort normal. Cardiovascular system: S1 & S2 heard, RRR. No JVD, murmurs, rubs, gallops or clicks. No pedal edema. Gastrointestinal system: Abdomen is nondistended, soft and nontender. No organomegaly or masses felt. Normal bowel sounds heard. Central nervous system: Alert and oriented. No focal neurological deficits.  Moving extremities spontaneously. Extremities: Symmetric 5 x 5 power. Skin: No rashes, lesions or ulcers Psychiatry: Judgement and insight appear fair. Mood & affect appropriate.     Data Reviewed: I have personally  reviewed following labs and imaging studies  CBC: Recent Labs  Lab 10/11/21 2133 10/12/21 0627  WBC 10.9* 8.5  NEUTROABS 9.0*  --   HGB 9.8* 8.6*  HCT 31.1* 26.3*  MCV 100.6* 97.4  PLT 267 970    Basic Metabolic Panel: Recent Labs  Lab 10/11/21 2133 10/12/21 0045 10/12/21 0627 10/12/21 1610 10/13/21 0026 10/14/21 0937  NA 142  --  140 137 138 135  K 2.6*  --  3.0* 4.4 4.0 4.2  CL 102  --  103 106 104 103  CO2 21*  --  26 22 25 25   GLUCOSE 204*  --  142* 109* 218* 184*  BUN 60*  --  55* 50* 49* 47*  CREATININE 1.90*  --  1.70* 1.55* 1.65* 1.81*  CALCIUM 10.2  --  9.6 9.2 9.7 9.9  MG  --  1.1* 3.5*  --   --  2.4  PHOS  --   --  3.6  --   --   --     GFR: Estimated Creatinine Clearance: 17.6 mL/min (A) (by C-G formula based on SCr of 1.81 mg/dL (H)).  Liver Function Tests: No results for input(s): "AST", "ALT", "ALKPHOS", "BILITOT", "PROT", "ALBUMIN" in the last 168 hours.  CBG: Recent Labs  Lab 10/13/21 1544 10/13/21  2053 10/14/21 0036 10/14/21 0343 10/14/21 0833  GLUCAP 114* 152* 116* 111* 129*     Recent Results (from the past 240 hour(s))  MRSA Next Gen by PCR, Nasal     Status: None   Collection Time: 10/12/21  2:41 AM   Specimen: Nasal Mucosa; Nasal Swab  Result Value Ref Range Status   MRSA by PCR Next Gen NOT DETECTED NOT DETECTED Final    Comment: (NOTE) The GeneXpert MRSA Assay (FDA approved for NASAL specimens only), is one component of a comprehensive MRSA colonization surveillance program. It is not intended to diagnose MRSA infection nor to guide or monitor treatment for MRSA infections. Test performance is not FDA approved in patients less than 38 years old. Performed at [facility], [place] [address], [place], [place] [address]   Urine Culture     Status: None   Collection Time: 10/12/21 11:19 AM   Specimen: Urine, Clean Catch  Result Value Ref Range Status   Specimen Description URINE, CLEAN CATCH  Final   Special Requests  NONE  Final   Culture   Final    NO GROWTH Performed at [facility], [place] [address], [place], [place] [address]    Report Status 10/13/2021 FINAL  Final  Culture, blood (Routine X 2) w Reflex to ID Panel     Status: None (Preliminary result)   Collection Time: 10/12/21 11:50 AM   Specimen: BLOOD  Result Value Ref Range Status   Specimen Description BLOOD RIGHT ANTECUBITAL  Final   Special Requests   Final    BOTTLES DRAWN AEROBIC AND ANAEROBIC Blood Culture adequate volume   Culture   Final    NO GROWTH < 24 HOURS Performed at [facility], [place] [address], [place], [place] [address]    Report Status PENDING  Incomplete  Culture, blood (Routine X 2) w Reflex to ID Panel     Status: None (Preliminary result)   Collection Time: 10/12/21 12:05 PM   Specimen: BLOOD RIGHT HAND  Result Value Ref Range Status   Specimen Description BLOOD RIGHT HAND  Final   Special Requests   Final    BOTTLES DRAWN AEROBIC AND ANAEROBIC Blood Culture adequate volume   Culture   Final    NO GROWTH < 24 HOURS Performed at [facility], [place] [address], [place], [place] [address]    Report Status PENDING  Incomplete         Radiology Studies: ECHOCARDIOGRAM COMPLETE  Result Date: 10/12/2021    ECHOCARDIOGRAM REPORT   Patient Name:   MALEEAH CROSSMAN Date of Exam: 10/12/2021 Medical Rec #:  [id number]     Height:       59.0 in Accession #:    [id number]    Weight:       147.7 lb Date of Birth:  01-13-1930      BSA:          1.621 m Patient Age:    72 years      BP:           116/44 mmHg Patient Gender: F             HR:           58 bpm. Exam Location:  Inpatient Procedure: 2D Echo, Cardiac Doppler, Color Doppler and Intracardiac            Opacification Agent  MODIFIED REPORT:  This report was modified by Cherlynn Kaiser MD on 10/12/2021 due to report send                                     to EMR.  Indications:     Cardiomyopathy  History:         Patient has prior  history of Echocardiogram examinations, most                  recent 12/12/2020. CHF, CAD, Pacemaker, Arrythmias:Atrial                  Fibrillation; Risk Factors:Diabetes, Dyslipidemia and Former                  Smoker.  Sonographer:     Clayton Lefort RDCS (AE) Referring Phys:  [id number] Lowella Dell REESE Diagnosing Phys: Cherlynn Kaiser MD  Sonographer Comments: Technically difficult study due to poor echo windows and no subcostal window obtained. IMPRESSIONS  1. The mitral valve is degenerative. Severe mitral valve regurgitation, posteriorly directed with posterior leaflet restriction and anterior leaflet override. No evidence of mitral stenosis. Severe mitral annular calcification.  2. Left ventricular ejection fraction, by estimation, is 45 to 50%. The left ventricle has mildly decreased function. The left ventricle demonstrates regional wall motion abnormalities (see scoring diagram/findings for description). Left ventricular diastolic parameters are indeterminate.  3. Right ventricular systolic function is mildly reduced. The right ventricular size is moderately enlarged.  4. Left atrial size was severely dilated.  5. The aortic valve is abnormal. There is severe calcifcation of the aortic valve. Aortic valve regurgitation is trivial. Mild aortic valve stenosis. Aortic valve mean gradient measures 8.0 mmHg. FINDINGS  Left Ventricle: Left ventricular ejection fraction, by estimation, is 45 to 50%. The left ventricle has mildly decreased function. The left ventricle demonstrates regional wall motion abnormalities. Definity contrast agent was given IV to delineate the left ventricular endocardial borders. The left ventricular internal cavity size was normal in size. There is no left ventricular hypertrophy. Left ventricular diastolic parameters are indeterminate.  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 The basal anteroseptal segment and basal inferoseptal segment are akinetic. Right Ventricle: The  right ventricular size is moderately enlarged. No increase in right ventricular wall thickness. Right ventricular systolic function is mildly reduced. Left Atrium: Left atrial size was severely dilated. Right Atrium: Right atrial size was normal in size. Pericardium: There is no evidence of pericardial effusion. Mitral Valve: The mitral valve is degenerative in appearance. Severe mitral annular calcification. Severe mitral valve regurgitation. No evidence of mitral valve stenosis. The mean mitral valve gradient is 2.7 mmHg. Tricuspid Valve: The tricuspid valve is normal in structure. Tricuspid valve regurgitation is not demonstrated. No evidence of tricuspid stenosis. Aortic Valve: The aortic valve is abnormal. There is severe calcifcation of the aortic valve. Aortic valve regurgitation is trivial. Mild aortic stenosis is present. Aortic valve mean gradient measures 8.0 mmHg. Aortic valve peak gradient measures 15.7 mmHg. Aortic valve area, by VTI measures 0.92 cm. Pulmonic Valve: The pulmonic valve was normal in structure. Pulmonic valve regurgitation is trivial. No evidence of pulmonic stenosis. Aorta: The aortic root is normal in size and structure. Venous: The inferior vena cava was not well visualized. IAS/Shunts: The interatrial septum was not assessed.  LEFT VENTRICLE PLAX 2D LVIDd:         4.70 cm LVIDs:  3.40 cm LV PW:         1.20 cm LV IVS:        0.84 cm LVOT diam:     1.90 cm LV SV:         40 LV SV Index:   25 LVOT Area:     2.84 cm  RIGHT VENTRICLE RV Basal diam:  4.20 cm RV Mid diam:    3.60 cm RV S prime:     7.62 cm/s TAPSE (M-mode): 2.0 cm LEFT ATRIUM              Index        RIGHT ATRIUM           Index LA diam:        5.70 cm  3.52 cm/m   RA Area:     20.70 cm LA Vol (A2C):   161.0 ml 99.29 ml/m  RA Volume:   51.00 ml  31.45 ml/m LA Vol (A4C):   144.0 ml 88.81 ml/m LA Biplane Vol: 156.0 ml 96.21 ml/m  AORTIC VALVE AV Area (Vmax):    1.01 cm AV Area (Vmean):   0.92 cm AV Area  (VTI):     0.92 cm AV Vmax:           198.00 cm/s AV Vmean:          130.000 cm/s AV VTI:            0.435 m AV Peak Grad:      15.7 mmHg AV Mean Grad:      8.0 mmHg LVOT Vmax:         70.30 cm/s LVOT Vmean:        42.000 cm/s LVOT VTI:          0.141 m LVOT/AV VTI ratio: 0.32  AORTA Ao Root diam: 2.90 cm Ao Asc diam:  3.70 cm MITRAL VALVE MV Mean grad: 2.7 mmHg SHUNTS                        Systemic VTI:  0.14 m                        Systemic Diam: 1.90 cm Cherlynn Kaiser MD Electronically signed by Cherlynn Kaiser MD Signature Date/Time: 10/12/2021/5:35:26 PM    Final (Updated)         Scheduled Meds:  aspirin EC  81 mg Oral Daily   DULoxetine  30 mg Oral Daily   heparin  5,000 Units Subcutaneous Q8H   insulin aspart  0-9 Units Subcutaneous TID WC   pantoprazole  20 mg Oral Daily   polyvinyl alcohol  1 drop Both Eyes TID   rosuvastatin  40 mg Oral Daily   Continuous Infusions:  sodium chloride 10 mL/hr at 10/13/21 1200   cefTRIAXone (ROCEPHIN)  IV 1 g (10/13/21 2351)     LOS: 2 days    Time spent: 35 minutes    Irine Seal, MD [facility]   To contact the attending provider between 7A-7P or the covering provider during after hours 7P-7A, please log into the web site [email or web address] and access using universal [place] password for that web site. If you do not have the password, please call the hospital operator.  10/14/2021, 11:15 AM

## 2021-10-15 ENCOUNTER — Inpatient Hospital Stay (HOSPITAL_COMMUNITY): Payer: Medicare (Managed Care)

## 2021-10-15 DIAGNOSIS — A419 Sepsis, unspecified organism: Secondary | ICD-10-CM | POA: Diagnosis not present

## 2021-10-15 DIAGNOSIS — K219 Gastro-esophageal reflux disease without esophagitis: Secondary | ICD-10-CM | POA: Diagnosis not present

## 2021-10-15 DIAGNOSIS — I950 Idiopathic hypotension: Secondary | ICD-10-CM | POA: Diagnosis not present

## 2021-10-15 DIAGNOSIS — N3000 Acute cystitis without hematuria: Secondary | ICD-10-CM | POA: Diagnosis not present

## 2021-10-15 LAB — GLUCOSE, CAPILLARY
Glucose-Capillary: 94 mg/dL (ref 70–99)
Glucose-Capillary: 134 mg/dL — ABNORMAL HIGH (ref 70–99)
Glucose-Capillary: 140 mg/dL — ABNORMAL HIGH (ref 70–99)
Glucose-Capillary: 107 mg/dL — ABNORMAL HIGH (ref 70–99)

## 2021-10-15 LAB — CBC
HCT: 26.5 % — ABNORMAL LOW (ref 36.0–46.0)
Hemoglobin: 8.4 g/dL — ABNORMAL LOW (ref 12.0–15.0)
MCH: 31.7 pg (ref 26.0–34.0)
MCHC: 31.7 g/dL (ref 30.0–36.0)
MCV: 100 fL (ref 80.0–100.0)
Platelets: 246 10*3/uL (ref 150–400)
RBC: 2.65 MIL/uL — ABNORMAL LOW (ref 3.87–5.11)
RDW: 14.9 % (ref 11.5–15.5)
WBC: 6.3 10*3/uL (ref 4.0–10.5)
nRBC: 0 % (ref 0.0–0.2)

## 2021-10-15 LAB — BASIC METABOLIC PANEL
Anion gap: 9 (ref 5–15)
BUN: 48 mg/dL — ABNORMAL HIGH (ref 8–23)
CO2: 25 mmol/L (ref 22–32)
Calcium: 10 mg/dL (ref 8.9–10.3)
Chloride: 105 mmol/L (ref 98–111)
Creatinine, Ser: 1.86 mg/dL — ABNORMAL HIGH (ref 0.44–1.00)
GFR, Estimated: 25 mL/min — ABNORMAL LOW (ref >60)
Glucose, Bld: 129 mg/dL — ABNORMAL HIGH (ref 70–99)
Potassium: 4.1 mmol/L (ref 3.5–5.1)
Sodium: 139 mmol/L (ref 135–145)

## 2021-10-15 LAB — TSH: TSH: 0.461 u[IU]/mL (ref 0.350–4.500)

## 2021-10-15 MED ORDER — FUROSEMIDE 10 MG/ML IJ SOLN
20.0000 mg | Freq: Once | INTRAMUSCULAR | Status: AC
  Administered 2021-10-15: 20 mg via INTRAVENOUS
  Filled 2021-10-15: qty 2

## 2021-10-15 NOTE — Plan of Care (Signed)
  Problem: Education: Goal: Knowledge of General Education information will improve Description: Including pain rating scale, medication(s)/side effects and non-pharmacologic comfort measures Outcome: Progressing   Problem: Health Behavior/Discharge Planning: Goal: Ability to manage health-related needs will improve Outcome: Progressing   Problem: Clinical Measurements: Goal: Respiratory complications will improve Outcome: Progressing   Problem: Activity: Goal: Risk for activity intolerance will decrease Outcome: Progressing   Problem: Clinical Measurements: Goal: Cardiovascular complication will be avoided Outcome: Progressing

## 2021-10-15 NOTE — Evaluation (Signed)
Physical Therapy Evaluation Patient Details Name: Evelyn Simmons MRN: [id number] DOB: Nov 07, 1929 Today's Date: 10/15/2021  History of Present Illness  Evelyn Simmons is an 22yoF who presents on 8/31 when [facility] called EMS for pt down on kitchen floor.  Pt hypotensive and with AMS.  PMH: COPD,  combined CHF, AF, PPM, HTN, hypoTSH. Pt is a PACE participant, mostly a household ambulator with tripod rollator.  Clinical Impression  Pt admitted with above diagnosis. Pt received in bed, alert and pleasant today with mentation seemingly close to baseline. Has some STM deficits but expect this is her baseline. Pt needed min A to ambulate in room. SpO2 dropped to 84% on RA, returned to 90's on 3L O2. Pt reports feeling very weak when she first gets up. BP 138/58, HR 50 bpm. Recommend more frequent supervision from family if possible.  Pt currently with functional limitations due to the deficits listed below (see PT Problem List). Pt will benefit from skilled PT to increase their independence and safety with mobility to allow discharge to the venue listed below.          Recommendations for follow up therapy are one component of a multi-disciplinary discharge planning process, led by the attending physician.  Recommendations may be updated based on patient status, additional functional criteria and insurance authorization.  Follow Up Recommendations Home health PT      Assistance Recommended at Discharge Intermittent Supervision/Assistance  Patient can return home with the following  A little help with walking and/or transfers;A little help with bathing/dressing/bathroom;Assist for transportation;Direct supervision/assist for medications management;Direct supervision/assist for financial management;Assistance with cooking/housework    Equipment Recommendations None recommended by PT  Recommendations for Other Services       Functional Status Assessment Patient has had a recent decline in their  functional status and demonstrates the ability to make significant improvements in function in a reasonable and predictable amount of time.     Precautions / Restrictions Precautions Precautions: Fall Restrictions Weight Bearing Restrictions: No      Mobility  Bed Mobility Overal bed mobility: Needs Assistance Bed Mobility: Supine to Sit     Supine to sit: Supervision     General bed mobility comments: increased time and effort but no physical assist needed    Transfers Overall transfer level: Needs assistance Equipment used: Rolling walker (2 wheels) Transfers: Sit to/from Stand, Bed to chair/wheelchair/BSC Sit to Stand: Min assist   Step pivot transfers: Min assist       General transfer comment: pt stood to RW with min A to steady. Pt stepped chair to [facility] and back without AD and min HHA with close guarding. Pt had taken O2 off to eat and became very SOB with transfer (SPO2 low 80's), 3L O2 reapplied    Ambulation/Gait Ambulation/Gait assistance: Min assist Gait Distance (Feet): 15 Feet Assistive device: Rolling walker (2 wheels) Gait Pattern/deviations: Step-through pattern, Decreased stride length Gait velocity: decreased Gait velocity interpretation: <1.31 ft/sec, indicative of household ambulator   General Gait Details: min A to navigate obstacles with RW  Stairs            Wheelchair Mobility    Modified Rankin (Stroke Patients Only)       Balance Overall balance assessment: Needs assistance Sitting-balance support: No upper extremity supported, Feet supported Sitting balance-Leahy Scale: Good     Standing balance support: Single extremity supported Standing balance-Leahy Scale: Poor Standing balance comment: needs stable surface for support in standing  Pertinent Vitals/Pain Pain Assessment Pain Assessment: No/denies pain    Home Living Family/patient expects to be discharged to:: Private  residence Living Arrangements: Alone Available Help at Discharge: Family;Available PRN/intermittently Type of Home: Apartment Home Access: Level entry       Home Layout: One level Home Equipment: Other (comment);Wheelchair - manual;Shower seat;BSC/3in1;Cane - single Barista (2 wheels) (rollator 3 wheels) Additional Comments: pt lives alone, has [employer], daughter drives her to appts. She reports she has not had O2 at home in awhile    Prior Function Prior Level of Function : Independent/Modified Independent             Mobility Comments: uses tripod rollator ADLs Comments: reports independence     Hand Dominance   Dominant Hand: Right    Extremity/Trunk Assessment   Upper Extremity Assessment Upper Extremity Assessment: Defer to OT evaluation    Lower Extremity Assessment Lower Extremity Assessment: Generalized weakness    Cervical / Trunk Assessment Cervical / Trunk Assessment: Normal  Communication   Communication: No difficulties  Cognition Arousal/Alertness: Awake/alert Behavior During Therapy: WFL for tasks assessed/performed Overall Cognitive Status: Within Functional Limits for tasks assessed                                 General Comments: age appropriate, questionable historian of recent events        General Comments General comments (skin integrity, edema, etc.): SPO2 96% on 3L O2, dropped to 84% on RA, returned to 98% on 3L, HR 50 bpm, BP 138/58    Exercises     Assessment/Plan    PT Assessment Patient needs continued PT services  PT Problem List Decreased strength;Decreased activity tolerance;Decreased balance;Decreased mobility;Cardiopulmonary status limiting activity       PT Treatment Interventions DME instruction;Gait training;Functional mobility training;Therapeutic activities;Therapeutic exercise;Balance training;Patient/family education    PT Goals (Current goals can be found in the Care Plan section)   Acute Rehab PT Goals Patient Stated Goal: go home PT Goal Formulation: With patient Time For Goal Achievement: 10/29/21 Potential to Achieve Goals: Good    Frequency Min 3X/week     Co-evaluation               AM-PAC PT "6 Clicks" Mobility  Outcome Measure Help needed turning from your back to your side while in a flat bed without using bedrails?: None Help needed moving from lying on your back to sitting on the side of a flat bed without using bedrails?: None Help needed moving to and from a bed to a chair (including a wheelchair)?: A Little Help needed standing up from a chair using your arms (e.g., wheelchair or bedside chair)?: A Little Help needed to walk in hospital room?: A Little Help needed climbing 3-5 steps with a railing? : A Lot 6 Click Score: 19    End of Session Equipment Utilized During Treatment: Gait belt;Oxygen Activity Tolerance: Patient limited by fatigue Patient left: in chair;with chair alarm set;with call bell/phone within reach Nurse Communication: Mobility status PT Visit Diagnosis: Dizziness and giddiness (R42);Muscle weakness (generalized) (M62.81);History of falling (Z91.81);Difficulty in walking, not elsewhere classified (R26.2);Unsteadiness on feet (R26.81)    Time: 3545-6256 PT Time Calculation (min) (ACUTE ONLY): 35 min   Charges:   PT Evaluation $PT Eval Moderate Complexity: 1 Mod PT Treatments $Gait Training: 8-22 mins        Leighton Roach, PT  Acute Rehab Services Secure chat preferred Office  [place] 10/15/2021, 8:33 AM

## 2021-10-15 NOTE — Progress Notes (Signed)
Mobility Specialist Progress Note:   10/15/21 1155  Mobility  Activity Transferred to/from [facility]  Level of Assistance Minimal assist, patient does 75% or more  Distance Ambulated (ft) 4 ft  Activity Response Tolerated well  $Mobility charge 1 Mobility   Pt received in bed needing to use BSC. No complaints of pain. Left in bed with call bell in reach and all needs met.   [facility] Oberia Beaudoin Mobility Specialist

## 2021-10-15 NOTE — Progress Notes (Signed)
PROGRESS NOTE    Evelyn Simmons  VQX:[id number] DOB: 30-Apr-1929 DOA: 10/11/2021 PCP: Dionicia Abler, NP (Inactive)   Chief Complaint  Patient presents with   Fall    Brief Narrative:  Patient is a 88 year old female presented to the ED after a fall at home EMS notified by [facility] found down on the kitchen floor initially incoherent became more lucid around, noted to be bradycardic to the 40s s/p PPM and also noted to have emesis x3.  Patient noted to have had a few day history of decreased oral intake, recently diagnosed with UTI and prescribed Macrobid which she got 2-3 doses per PACE.  Patient presented to the ED noted to be hypotensive with MAP of 50s and wide pulse pressure, received IV fluids with no improvement with BP requiring pressor initiation.  Urinalysis done concerning for UTI.  Patient placed empirically on IV antibiotics.  Blood pressure improved patient transferred out of the unit.  Cardiology consulted who assessed patient, reviewed 2D echo and felt nothing further to add from cardiology standpoint and recommending consideration for goals of care.   Assessment & Plan:   Principal Problem:   Hypotension Active Problems:   Septic shock (HCC)   Acute cystitis without hematuria  #1 septic shock likely secondary to UTI, POA -Patient admitted treated for sepsis with septic shock requiring fluid resuscitation and pressor support. -Urinalysis done concerning for UTI -Urine cultures with no growth to date. -Patient initially required pressors however has been weaned off pressors. -Concern for chronotropic incompetence as well for persistent hypotension and cardiology consulted with nothing further to add at this time. -Continue empiric IV antibiotics and can subsequently be transitioned in the next 1 to 2 days to oral antibiotics to complete a 5 to 7-day course of treatment.  2.  SSS status post AVN ablation and PPM placement/PAF/history of CAD status post DES/history of  MI/CHF/ICM/elevated troponin -Patient noted by 2D echo of 2022 with a EF of 45 to 50%, positive RWMA, PAF/CHB status post pacemaker placement noted to have elevated troponin on admission however patient remained asymptomatic. -EKG with no ischemic changes noted. -Elevated troponin felt likely secondary to demand ischemia per PCCM. -Repeat 2D echo done with EF of 45 to 50%, mildly reduced right ventricular systolic function, left ventricle with regional wall motion abnormalities, severely dilated left atrial size, severe MVR, mild aortic valve stenosis. -Patient seen in consultation by cardiology with nothing further to add at this time and cardiology recommending evaluation for goals of care. -We will consult with palliative care for goals of care.  3 AKI on CKD stage IIIb -Patient with recent outpatient treatment for UTI. -Avoid nephrotoxic agents. -Patient initially hydrated on admission which is subsequently been discontinued. -Continue treatment for UTI. -Renal function fluctuating and seems to be fluctuating at 1.86. -Saline lock IV fluids.   -Follow.   4.  COPD/hypoxia -Patient noted to require 2 L O2 at night only per PACE. -Continue supplemental O2 as needed. -Patient with sats of 84% at rest, 79% on room air with ambulation, 98% on 3 L with ambulation. -As patient noted to be on O2 only at night prior to admission we will get a chest x-ray. -Pulmonary hygiene.  5.  Diabetes mellitus type 2 -Hemoglobin A1c 6.8 (10/12/2021) -CBG 94 this morning. -Continue CBG.  SSI.   6.  GERD -Continue PPI.  7.  ?? Hypothyroidism -Synthroid not noted on MAR. -Spoke with PCP and patient not on thyroid replacement therapy. -TSH within normal limits of  0.461. -Outpatient follow-up with PCP.   DVT prophylaxis: Heparin Code Status: Full Family Communication: No family at bedside. Disposition: TBD.  Hopefully home with home health.  Status is: Inpatient Remains inpatient appropriate  because: Severity of illness   Consultants:  Admitted to PCCM. Cardiology: Dr. Johnsie Cancel 10/13/2020  Procedures:  CT head CT C-spine 10/11/2021 Chest x-ray 10/11/2021 2D echo 10/12/2021   Significant Hospital Events: Including procedures, antibiotic start and stop dates in addition to other pertinent events   8/31 Presented to [facility] ED via EMS after fall at home. [employer] called EMS. CT Head/Cspine negative. Hypotension with SBPs in 90s and MAPs in 50s. Fluids given without significant improvement 9/1 Pressors initiated peripherally. PCCM consulted for ICU admission. 9/1 CTX started for UTI 9/1 Troponin elevation to 504   Antimicrobials:  IV Rocephin 10/12/2021>>>>>   Subjective: Sitting up at the side of the bed with therapy.  Denies any chest pain or shortness of breath.  No abdominal pain.  Overall feeling better.  Patient noted to be hypoxic with ambulatory sats of 79%, sats on room air at rest 84%, sats on 3 L O2 at 98%.    Objective: Vitals:   10/15/21 0004 10/15/21 0400 10/15/21 0500 10/15/21 1320  BP: (!) 116/39 (!) 124/42  (!) 126/53  Pulse: (!) 50 99    Resp: 20 20    Temp: 98 F (36.7 C) 98.1 F (36.7 C)  98 F (36.7 C)  TempSrc: Oral Oral  Oral  SpO2: 95% 94% 90% 100%  Weight:   68.9 kg   Height:        Intake/Output Summary (Last 24 hours) at 10/15/2021 1447 Last data filed at 10/15/2021 1322 Gross per 24 hour  Intake 1225.76 ml  Output 650 ml  Net 575.76 ml    Filed Weights   10/13/21 1634 10/14/21 0500 10/15/21 0500  Weight: 70.2 kg 70 kg 68.9 kg    Examination:  General exam: NAD Respiratory system: CTA B.  No wheezes, no crackles, no rhonchi.  Normal respiratory effort.  Cardiovascular system: Regular rate rhythm no murmurs rubs or gallops.  No JVD.  No lower extremity edema.  Gastrointestinal system: Abdomen is soft, nontender, nondistended, positive bowel sounds.  No rebound.  No guarding.  Abdomen is nondistended, soft and nontender. No organomegaly or  masses felt. Normal  Central nervous system: Alert and oriented.  Moving extremities spontaneously.  No focal neurological deficits.   Extremities: Symmetric 5 x 5 power. Skin: No rashes, lesions or ulcers Psychiatry: Judgement and insight appear fair. Mood & affect appropriate.     Data Reviewed: I have personally reviewed following labs and imaging studies  CBC: Recent Labs  Lab 10/11/21 2133 10/12/21 0627 10/15/21 0226  WBC 10.9* 8.5 6.3  NEUTROABS 9.0*  --   --   HGB 9.8* 8.6* 8.4*  HCT 31.1* 26.3* 26.5*  MCV 100.6* 97.4 100.0  PLT 267 293 246     Basic Metabolic Panel: Recent Labs  Lab 10/12/21 0045 10/12/21 0627 10/12/21 1610 10/13/21 0026 10/14/21 0937 10/15/21 0226  NA  --  140 137 138 135 139  K  --  3.0* 4.4 4.0 4.2 4.1  CL  --  103 106 104 103 105  CO2  --  26 22 25 25 25   GLUCOSE  --  142* 109* 218* 184* 129*  BUN  --  55* 50* 49* 47* 48*  CREATININE  --  1.70* 1.55* 1.65* 1.81* 1.86*  CALCIUM  --  9.6  9.2 9.7 9.9 10.0  MG 1.1* 3.5*  --   --  2.4  --   PHOS  --  3.6  --   --   --   --      GFR: Estimated Creatinine Clearance: 17 mL/min (A) (by C-G formula based on SCr of 1.86 mg/dL (H)).  Liver Function Tests: No results for input(s): "AST", "ALT", "ALKPHOS", "BILITOT", "PROT", "ALBUMIN" in the last 168 hours.  CBG: Recent Labs  Lab 10/14/21 1132 10/14/21 1643 10/14/21 2127 10/15/21 0627 10/15/21 1150  GLUCAP 138* 177* 142* 94 134*      Recent Results (from the past 240 hour(s))  MRSA Next Gen by PCR, Nasal     Status: None   Collection Time: 10/12/21  2:41 AM   Specimen: Nasal Mucosa; Nasal Swab  Result Value Ref Range Status   MRSA by PCR Next Gen NOT DETECTED NOT DETECTED Final    Comment: (NOTE) The GeneXpert MRSA Assay (FDA approved for NASAL specimens only), is one component of a comprehensive MRSA colonization surveillance program. It is not intended to diagnose MRSA infection nor to guide or monitor treatment for MRSA  infections. Test performance is not FDA approved in patients less than 3 years old. Performed at [facility], [place] [address] [place], [place] [address]   Urine Culture     Status: None   Collection Time: 10/12/21 11:19 AM   Specimen: Urine, Clean Catch  Result Value Ref Range Status   Specimen Description URINE, CLEAN CATCH  Final   Special Requests NONE  Final   Culture   Final    NO GROWTH Performed at [facility], [place] [address], [place], [place] [address]    Report Status 10/13/2021 FINAL  Final  Culture, blood (Routine X 2) w Reflex to ID Panel     Status: None (Preliminary result)   Collection Time: 10/12/21 11:50 AM   Specimen: BLOOD  Result Value Ref Range Status   Specimen Description BLOOD RIGHT ANTECUBITAL  Final   Special Requests   Final    BOTTLES DRAWN AEROBIC AND ANAEROBIC Blood Culture adequate volume   Culture   Final    NO GROWTH 3 DAYS Performed at [facility], [place] [address], [place], [place] [address]    Report Status PENDING  Incomplete  Culture, blood (Routine X 2) w Reflex to ID Panel     Status: None (Preliminary result)   Collection Time: 10/12/21 12:05 PM   Specimen: BLOOD RIGHT HAND  Result Value Ref Range Status   Specimen Description BLOOD RIGHT HAND  Final   Special Requests   Final    BOTTLES DRAWN AEROBIC AND ANAEROBIC Blood Culture adequate volume   Culture   Final    NO GROWTH 3 DAYS Performed at [facility], [place] [address] [place], [place] [address]    Report Status PENDING  Incomplete         Radiology Studies: No results found.      Scheduled Meds:  aspirin EC  81 mg Oral Daily   DULoxetine  30 mg Oral Daily   heparin  5,000 Units Subcutaneous Q8H   insulin aspart  0-9 Units Subcutaneous TID WC   pantoprazole  20 mg Oral Daily   polyvinyl alcohol  1 drop Both Eyes TID   rosuvastatin  40 mg Oral Daily   Continuous Infusions:  sodium chloride Stopped (10/14/21 2235)   cefTRIAXone  (ROCEPHIN)  IV Stopped (  10/14/21 2139)     LOS: 3 days    Time spent: 35 minutes    Irine Seal, MD [facility]   To contact the attending provider between 7A-7P or the covering provider during after hours 7P-7A, please log into the web site [email or web address] and access using universal [place] password for that web site. If you do not have the password, please call the hospital operator.  10/15/2021, 2:47 PM

## 2021-10-15 NOTE — Progress Notes (Signed)
Mobility Specialist Progress Note:   10/15/21 1000  Mobility  Activity Transferred from chair to bed  Level of Assistance Minimal assist, patient does 75% or more  Assistive Device  (HHA)  Distance Ambulated (ft) 2 ft  Activity Response Tolerated well  $Mobility charge 1 Mobility   Pt received in chair asking to go back to bed. No complaints of pain. Left in bed with call bell in reach and all needs met.   [facility] Evelyn Simmons Mobility Specialist

## 2021-10-15 NOTE — Evaluation (Signed)
Occupational Therapy Evaluation Patient Details Name: Evelyn Simmons MRN: [id number] DOB: [birth year] Today's Date: 10/15/2021   History of Present Illness Evelyn Simmons is an 54yoF who presents on 8/31 when [facility] called EMS for pt down on kitchen floor.  Pt hypotensive and with AMS.  PMH: COPD,  combined CHF, AF, PPM, HTN, hypoTSH. Pt is a PACE participant, mostly a household ambulator with tripod rollator.   Clinical Impression   Pt lives alone with a personal care attendant visiting twice a week and assisting with IADLs. Her daughter gets her groceries and provides transportation. Pt reports she stands to shower and only showers when her aide in in her home. Pt walks with a 3 wheeled walker and is independent in light meal prep and light housekeeping. No family available to determine baseline cognition, pt displays memory deficits. Pt presents with decreased activity tolerance with dependence on 3L 02 to maintain Sp02>90%, generalized weakness and impaired standing balance. She requires up to min assist for ADLs and mobility with RW. Recommending HHOT and increased supervision of her family and aide.      Recommendations for follow up therapy are one component of a multi-disciplinary discharge planning process, led by the attending physician.  Recommendations may be updated based on patient status, additional functional criteria and insurance authorization.   Follow Up Recommendations  Home health OT    Assistance Recommended at Discharge Frequent or constant Supervision/Assistance  Patient can return home with the following A little help with walking and/or transfers;A little help with bathing/dressing/bathroom;Assistance with cooking/housework;Direct supervision/assist for financial management;Assist for transportation;Help with stairs or ramp for entrance    Functional Status Assessment  Patient has had a recent decline in their functional status and demonstrates the ability to make  significant improvements in function in a reasonable and predictable amount of time.  Equipment Recommendations  None recommended by OT    Recommendations for Other Services       Precautions / Restrictions Precautions Precautions: Fall Restrictions Weight Bearing Restrictions: No      Mobility Bed Mobility Overal bed mobility: Needs Assistance Bed Mobility: Supine to Sit, Sit to Supine     Supine to sit: Supervision Sit to supine: Supervision   General bed mobility comments: increased time and effort but no physical assist needed, HOB nearly flat    Transfers Overall transfer level: Needs assistance Equipment used: Rolling walker (2 wheels) Transfers: Sit to/from Stand Sit to Stand: Min assist           General transfer comment: steadying assist      Balance Overall balance assessment: Needs assistance Sitting-balance support: No upper extremity supported, Feet supported Sitting balance-Leahy Scale: Good       Standing balance-Leahy Scale: Poor Standing balance comment: reliant on RW                           ADL either performed or assessed with clinical judgement   ADL Overall ADL's : Needs assistance/impaired Eating/Feeding: Independent;Sitting   Grooming: Min guard;Standing;Wash/dry hands   Upper Body Bathing: Set up;Sitting   Lower Body Bathing: Minimal assistance;Sit to/from stand   Upper Body Dressing : Set up;Sitting   Lower Body Dressing: Minimal assistance;Sit to/from stand Lower Body Dressing Details (indicate cue type and reason): pt flexes LE up on bed to don socks Toilet Transfer: Minimal assistance;Ambulation;Rolling walker (2 wheels)   Toileting- Clothing Manipulation and Hygiene: Minimal assistance;Sit to/from stand  Vision Patient Visual Report: No change from baseline Additional Comments: 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, cannot state why     Perception     Praxis      Pertinent Vitals/Pain  Pain Assessment Pain Assessment: No/denies pain     Hand Dominance Right   Extremity/Trunk Assessment Upper Extremity Assessment Upper Extremity Assessment: Overall WFL for tasks assessed   Lower Extremity Assessment Lower Extremity Assessment: Defer to PT evaluation   Cervical / Trunk Assessment Cervical / Trunk Assessment: Normal   Communication Communication Communication: No difficulties   Cognition Arousal/Alertness: Awake/alert Behavior During Therapy: WFL for tasks assessed/performed Overall Cognitive Status: Impaired/Different from baseline Area of Impairment: Memory                     Memory: Decreased short-term memory         General Comments: pt does not recall why she fell, meds are managed for her, she remembers to take them, pt without 02 on upon arrival, disoriented to time     General Comments       Exercises     Shoulder Instructions      Home Living Family/patient expects to be discharged to:: Private residence Living Arrangements: Alone Available Help at Discharge: Family;Available PRN/intermittently;Personal care attendant Type of Home: Apartment Home Access: Level entry     Home Layout: One level     Bathroom Shower/Tub: Occupational psychologist: Standard     Home Equipment: Other (comment);Wheelchair - manual;Shower seat;BSC/3in1;Cane - single Barista (2 wheels) (3 wheeled rollator)   Additional Comments: has an aide x 2/week to help with [facility] makes appointments for her, daughter gets groceries, has [facility]      Prior Functioning/Environment Prior Level of Function : Independent/Modified Independent             Mobility Comments: uses tripod rollator ADLs Comments: stands to shower, prepares simple meals, relies on aide for housekeeping        OT Problem List: Decreased strength;Decreased activity tolerance;Impaired balance (sitting and/or standing);Decreased knowledge of use of  DME or AE;Cardiopulmonary status limiting activity;Obesity      OT Treatment/Interventions: Self-care/ADL training;Energy conservation;DME and/or AE instruction;Therapeutic activities;Patient/family education;Balance training    OT Goals(Current goals can be found in the care plan section) Acute Rehab OT Goals OT Goal Formulation: With patient Time For Goal Achievement: 10/29/21 Potential to Achieve Goals: Good ADL Goals Pt Will Perform Grooming: with supervision;standing Pt Will Perform Lower Body Bathing: with supervision;sit to/from stand Pt Will Perform Lower Body Dressing: with supervision;sit to/from stand Pt Will Transfer to Toilet: with supervision;ambulating Pt Will Perform Toileting - Clothing Manipulation and hygiene: with supervision;sit to/from stand Additional ADL Goal #1: Pt will state at least 3 energy conservation strategies as instructed.  OT Frequency: Min 2X/week    Co-evaluation              AM-PAC OT "6 Clicks" Daily Activity     Outcome Measure Help from another person eating meals?: None Help from another person taking care of personal grooming?: A Little Help from another person toileting, which includes using toliet, bedpan, or urinal?: A Little Help from another person bathing (including washing, rinsing, drying)?: A Little Help from another person to put on and taking off regular upper body clothing?: None Help from another person to put on and taking off regular lower body clothing?: A Little 6 Click Score: 20   End of Session Equipment Utilized During  Treatment: Rolling walker (2 wheels);Gait belt;Oxygen (3L)  Activity Tolerance: Patient tolerated treatment well Patient left: in bed;with call bell/phone within reach;with bed alarm set;Other (comment) (MD present)  OT Visit Diagnosis: Unsteadiness on feet (R26.81);Other abnormalities of gait and mobility (R26.89);Muscle weakness (generalized) (M62.81);Other symptoms and signs involving cognitive  function                Time: 5110-2111 OT Time Calculation (min): 14 min Charges:  OT General Charges $OT Visit: 1 Visit OT Evaluation $OT Eval Moderate Complexity: [place], OTR/L Acute Rehabilitation Services Office: [phone]   Malka So 10/15/2021, 1:33 PM

## 2021-10-15 NOTE — Progress Notes (Signed)
Additional PT Note:   SATURATION QUALIFICATIONS: (This note is used to comply with regulatory documentation for home oxygen)  Patient Saturations on Room Air at Rest = 84%  Patient Saturations on Room Air while Ambulating = 79%  Patient Saturations on 3 Liters of oxygen while Ambulating = 98%  Please briefly explain why patient needs home oxygen:Pt with SOB and hypoxia at rest and when mobilizing on RA. SPO2 remains in 90's when pt on 3L O2.    Leighton Roach, PT  Acute Rehab Services Secure chat preferred Office [phone]

## 2021-10-15 NOTE — Progress Notes (Signed)
Transition of Care Department [facility]) following patient for high risk of readmission.   Patient admitted with septic shock likely secondary to UTI. Patient is a patient of [facility].  [facility] is closed for the holiday.   Transition of Care Department [facility]) has reviewed patient and we will continue to monitor patient advancement through interdisciplinary progression rounds.

## 2021-10-16 DIAGNOSIS — I5023 Acute on chronic systolic (congestive) heart failure: Secondary | ICD-10-CM | POA: Diagnosis not present

## 2021-10-16 DIAGNOSIS — R55 Syncope and collapse: Secondary | ICD-10-CM

## 2021-10-16 DIAGNOSIS — N3000 Acute cystitis without hematuria: Secondary | ICD-10-CM | POA: Diagnosis not present

## 2021-10-16 DIAGNOSIS — I950 Idiopathic hypotension: Secondary | ICD-10-CM | POA: Diagnosis not present

## 2021-10-16 DIAGNOSIS — A419 Sepsis, unspecified organism: Secondary | ICD-10-CM | POA: Diagnosis not present

## 2021-10-16 LAB — BASIC METABOLIC PANEL
Anion gap: 9 (ref 5–15)
BUN: 37 mg/dL — ABNORMAL HIGH (ref 8–23)
CO2: 28 mmol/L (ref 22–32)
Calcium: 10.5 mg/dL — ABNORMAL HIGH (ref 8.9–10.3)
Chloride: 102 mmol/L (ref 98–111)
Creatinine, Ser: 1.49 mg/dL — ABNORMAL HIGH (ref 0.44–1.00)
GFR, Estimated: 33 mL/min — ABNORMAL LOW (ref >60)
Glucose, Bld: 97 mg/dL (ref 70–99)
Potassium: 4 mmol/L (ref 3.5–5.1)
Sodium: 139 mmol/L (ref 135–145)

## 2021-10-16 LAB — CBC WITH DIFFERENTIAL/PLATELET
Abs Immature Granulocytes: 0.03 10*3/uL (ref 0.00–0.07)
Basophils Absolute: 0 10*3/uL (ref 0.0–0.1)
Basophils Relative: 0 %
Eosinophils Absolute: 0.2 10*3/uL (ref 0.0–0.5)
Eosinophils Relative: 3 %
HCT: 26.6 % — ABNORMAL LOW (ref 36.0–46.0)
Hemoglobin: 8.6 g/dL — ABNORMAL LOW (ref 12.0–15.0)
Immature Granulocytes: 0 %
Lymphocytes Relative: 20 %
Lymphs Abs: 1.4 10*3/uL (ref 0.7–4.0)
MCH: 31.4 pg (ref 26.0–34.0)
MCHC: 32.3 g/dL (ref 30.0–36.0)
MCV: 97.1 fL (ref 80.0–100.0)
Monocytes Absolute: 1 10*3/uL (ref 0.1–1.0)
Monocytes Relative: 15 %
Neutro Abs: 4.2 10*3/uL (ref 1.7–7.7)
Neutrophils Relative %: 62 %
Platelets: 296 10*3/uL (ref 150–400)
RBC: 2.74 MIL/uL — ABNORMAL LOW (ref 3.87–5.11)
RDW: 14.8 % (ref 11.5–15.5)
WBC: 6.8 10*3/uL (ref 4.0–10.5)
nRBC: 0 % (ref 0.0–0.2)

## 2021-10-16 LAB — GLUCOSE, CAPILLARY
Glucose-Capillary: 95 mg/dL (ref 70–99)
Glucose-Capillary: 136 mg/dL — ABNORMAL HIGH (ref 70–99)
Glucose-Capillary: 117 mg/dL — ABNORMAL HIGH (ref 70–99)
Glucose-Capillary: 176 mg/dL — ABNORMAL HIGH (ref 70–99)

## 2021-10-16 LAB — BRAIN NATRIURETIC PEPTIDE: B Natriuretic Peptide: 728.1 pg/mL — ABNORMAL HIGH (ref 0.0–100.0)

## 2021-10-16 MED ORDER — FUROSEMIDE 10 MG/ML IJ SOLN
20.0000 mg | Freq: Once | INTRAMUSCULAR | Status: AC
  Administered 2021-10-16: 20 mg via INTRAVENOUS
  Filled 2021-10-16: qty 2

## 2021-10-16 MED ORDER — CEFDINIR 300 MG PO CAPS
300.0000 mg | ORAL_CAPSULE | Freq: Every day | ORAL | Status: AC
  Administered 2021-10-16 – 2021-10-17 (×2): 300 mg via ORAL
  Filled 2021-10-16 (×2): qty 1

## 2021-10-16 NOTE — Significant Event (Signed)
Patient complaining of soreness to Posterior head upon assessment noted a purple knot.

## 2021-10-16 NOTE — Care Management Important Message (Signed)
Important Message  Patient Details  Name: Evelyn Simmons MRN: [id number] Date of Birth: 02-26-1929   Medicare Important Message Given:  Yes     Shelda Altes 10/16/2021, 8:17 AM

## 2021-10-16 NOTE — Progress Notes (Signed)
SATURATION QUALIFICATIONS: (This note is used to comply with regulatory documentation for home oxygen)  Patient Saturations on Room Air at Rest = 85%  Patient Saturations on Room Air while Ambulating = 87%  Patient Saturations on 2 Liters of oxygen while Ambulating = 95%  Please briefly explain why patient needs home oxygen: SHOB Still diuresing and receiving Antibiotics

## 2021-10-16 NOTE — Progress Notes (Signed)
Physical Therapy Treatment Patient Details Name: Evelyn Simmons MRN: [id number] DOB: May 07, 1929 Today's Date: 10/16/2021   History of Present Illness Evelyn Simmons is an 55yoF who presents on 8/31 when [facility] called EMS for pt down on kitchen floor.  Pt hypotensive and with AMS.  PMH: COPD,  combined CHF, AF, PPM, HTN, hypoTSH. Pt is a PACE participant, mostly a household ambulator with tripod rollator.    PT Comments    Pt progressing with mobility. She was able to ambulate 100' with rollator but despite SPO2 being 95% on 2L, her dyspnea increased to the point she needed a seated rest break at 50'. Recovered within 3 mins and could continue to ambulate. Pt with posterior bias in standing, needs UE support for all standing activity and continues to be high fall risk. PT will continue to follow.    Recommendations for follow up therapy are one component of a multi-disciplinary discharge planning process, led by the attending physician.  Recommendations may be updated based on patient status, additional functional criteria and insurance authorization.  Follow Up Recommendations  Home health PT     Assistance Recommended at Discharge Intermittent Supervision/Assistance  Patient can return home with the following A little help with walking and/or transfers;A little help with bathing/dressing/bathroom;Assist for transportation;Direct supervision/assist for medications management;Direct supervision/assist for financial management;Assistance with cooking/housework   Equipment Recommendations  None recommended by PT    Recommendations for Other Services       Precautions / Restrictions Precautions Precautions: Fall Restrictions Weight Bearing Restrictions: No     Mobility  Bed Mobility Overal bed mobility: Needs Assistance Bed Mobility: Supine to Sit     Supine to sit: Supervision     General bed mobility comments: increased time and effort but no physical assist needed, HOB nearly  flat    Transfers Overall transfer level: Needs assistance Equipment used: Rollator (4 wheels) Transfers: Sit to/from Stand Sit to Stand: Min assist           General transfer comment: steadying assist    Ambulation/Gait Ambulation/Gait assistance: Min assist Gait Distance (Feet): 100 Feet (1 seated rest break) Assistive device: Rollator (4 wheels) Gait Pattern/deviations: Step-through pattern, Decreased stride length Gait velocity: decreased Gait velocity interpretation: <1.8 ft/sec, indicate of risk for recurrent falls   General Gait Details: pt calls her rollator Evlyn Clines so she referred to this one the same way. Ambulated on 2L O2 with sats remaining in 90's but DOE 3/4 at 8' and needed seated rest break of 3 mins to recover and be able to safely ambulate again.   Stairs             Wheelchair Mobility    Modified Rankin (Stroke Patients Only)       Balance Overall balance assessment: Needs assistance Sitting-balance support: No upper extremity supported, Feet supported Sitting balance-Leahy Scale: Good Sitting balance - Comments: able to lean bkwd for purewick removed without falling back, and able to come back up   Standing balance support: Single extremity supported Standing balance-Leahy Scale: Poor Standing balance comment: reliant on RW, posterior bias in standing                            Cognition Arousal/Alertness: Awake/alert Behavior During Therapy: WFL for tasks assessed/performed Overall Cognitive Status: Impaired/Different from baseline Area of Impairment: Memory                     Memory: Decreased  short-term memory         General Comments: not remembering to keep her O2 in her nose, does not remember to push call bell and yells out instead, unsure of basline        Exercises      General Comments General comments (skin integrity, edema, etc.): SPO2 95% on 2 L O2, HR 50 bpm      Pertinent Vitals/Pain  Pain Assessment Pain Assessment: Faces Faces Pain Scale: Hurts a little bit Pain Location: soreness posterior head where she fell at home. There is a bump and redness Pain Descriptors / Indicators: Sore Pain Intervention(s): Limited activity within patient's tolerance, Monitored during session    Home Living                          Prior Function            PT Goals (current goals can now be found in the care plan section) Acute Rehab PT Goals Patient Stated Goal: go home PT Goal Formulation: With patient Time For Goal Achievement: 10/29/21 Potential to Achieve Goals: Good Progress towards PT goals: Progressing toward goals    Frequency    Min 3X/week      PT Plan Current plan remains appropriate    Co-evaluation              AM-PAC PT "6 Clicks" Mobility   Outcome Measure  Help needed turning from your back to your side while in a flat bed without using bedrails?: None Help needed moving from lying on your back to sitting on the side of a flat bed without using bedrails?: None Help needed moving to and from a bed to a chair (including a wheelchair)?: A Little Help needed standing up from a chair using your arms (e.g., wheelchair or bedside chair)?: A Little Help needed to walk in hospital room?: A Little Help needed climbing 3-5 steps with a railing? : A Lot 6 Click Score: 19    End of Session Equipment Utilized During Treatment: Gait belt;Oxygen Activity Tolerance: Patient tolerated treatment well Patient left: in chair;with chair alarm set;with call bell/phone within reach Nurse Communication: Mobility status PT Visit Diagnosis: Dizziness and giddiness (R42);Muscle weakness (generalized) (M62.81);History of falling (Z91.81);Difficulty in walking, not elsewhere classified (R26.2);Unsteadiness on feet (R26.81)     Time: 1100-1120 PT Time Calculation (min) (ACUTE ONLY): 20 min  Charges:  $Gait Training: 8-22 mins                      Leighton Roach, PT  Acute Rehab Services Secure chat preferred Office [place] 10/16/2021, 12:09 PM

## 2021-10-16 NOTE — Plan of Care (Signed)
  Problem: Education: Goal: Knowledge of General Education information will improve Description Including pain rating scale, medication(s)/side effects and non-pharmacologic comfort measures Outcome: Progressing   Problem: Health Behavior/Discharge Planning: Goal: Ability to manage health-related needs will improve Outcome: Progressing   

## 2021-10-16 NOTE — Progress Notes (Signed)
PROGRESS NOTE    Evelyn Simmons  MOQ:[id number] DOB: 1929-03-31 DOA: 10/11/2021 PCP: Dionicia Abler, NP (Inactive)   Chief Complaint  Patient presents with   Fall    Brief Narrative:  Patient is a 88 year old female presented to the ED after a fall at home EMS notified by life alert found down on the kitchen floor initially incoherent became more lucid around, noted to be bradycardic to the 40s s/p PPM and also noted to have emesis x3.  Patient noted to have had a few day history of decreased oral intake, recently diagnosed with UTI and prescribed Macrobid which she got 2-3 doses per [facility].  Patient presented to the ED noted to be hypotensive with MAP of 50s and wide pulse pressure, received IV fluids with no improvement with BP requiring pressor initiation.  Urinalysis done concerning for UTI.  Patient placed empirically on IV antibiotics.  Blood pressure improved patient transferred out of the unit.  Cardiology consulted who assessed patient, reviewed 2D echo and felt nothing further to add from cardiology standpoint and recommending consideration for goals of care.   Assessment & Plan:   Principal Problem:   Hypotension Active Problems:   Acute on chronic systolic CHF (congestive heart failure) (HCC)   Septic shock (HCC)   Acute cystitis without hematuria   Syncope and collapse  #1 septic shock likely secondary to UTI, POA -Patient admitted treated for sepsis with septic shock requiring fluid resuscitation and pressor support. -Urinalysis done concerning for UTI -Urine cultures with no growth to date. -Patient initially required pressors however has been weaned off pressors. -Concern for chronotropic incompetence as well for persistent hypotension and cardiology consulted with nothing further to add at this time. -Change IV Rocephin to oral Omnicef to complete a 5 to 7-day course of treatment.   2.  SSS status post AVN ablation and PPM placement/PAF/history of CAD status post  DES/history of MI/CHF/ICM/elevated troponin -Patient noted by 2D echo of 2022 with a EF of 45 to 50%, positive RWMA, PAF/CHB status post pacemaker placement noted to have elevated troponin on admission however patient remained asymptomatic. -EKG with no ischemic changes noted. -Elevated troponin felt likely secondary to demand ischemia per PCCM. -Repeat 2D echo done with EF of 45 to 50%, mildly reduced right ventricular systolic function, left ventricle with regional wall motion abnormalities, severely dilated left atrial size, severe MVR, mild aortic valve stenosis. -Patient seen in consultation by cardiology with nothing further to add at this time and cardiology recommending evaluation for goals of care. -Palliative care will be consulted in the outpatient setting for goals of care, discussed with pace MD. -Was notified by EP that patient's PPM has met end of service and will need a generator change prior to discharge. -PPM will be changed potentially tomorrow and patient will be seen in formal consultation by cardiology/EP tomorrow.  3.  Acute on chronic systolic heart failure -Patient noted with a new hypoxia, not on daily home O2 but on O2 at bedtime.  Admission history. -Patient noted with complaints of shortness of breath. -Chest x-ray done on 10/15/2021 concerning for volume overload. -BNP obtained at 728. -Patient given a dose of Lasix 20 mg IV x1 on 10/15/2021 with urine output of 2.3 L. -We will give Lasix 20 mg IV every 12 hours x2 doses. -Strict I's and O's, daily weights. -Reassess in the a.m. to see if patient needs further diuresis with IV Lasix otherwise may consider resuming home regimen Demadex and metolazone. -Cardiology to formally consult  tomorrow to assess PPM for potential generator change.  4 AKI on CKD stage IIIb -Patient with recent outpatient treatment for UTI. -Avoid nephrotoxic agents. -Patient initially hydrated on admission which is subsequently been  discontinued. -Continue treatment for UTI. -Renal function was fluctuating and trending back down with diuresis with a creatinine currently at 1.49 today.   -Follow renal function with diuretics.   -Follow.   5.  COPD/hypoxia -Patient noted to require 2 L O2 at night only per [facility]. -Continue supplemental O2 as needed. -Patient with sats of 84% at rest, 79% on room air with ambulation, 98% on 3 L with ambulation on 10/15/2021. -Repeat ambulatory sats today with sats of 85% on room air, 87% on ambulation, 95% on 2 L. -Chest x-ray obtained consistent with volume overload. -Hypoxia likely secondary to acute on chronic systolic CHF exacerbation/volume overload. -Patient given Lasix 20 mg IV x1 on 10/15/2021 with urine output of 2.3 L over the past 24 hours. -Continue pulmonary hygiene.  6.  Diabetes mellitus type 2 -Hemoglobin A1c 6.8 (10/12/2021) -CBG 95 this morning.  -Continue CBG.  SSI.   7.  GERD -PPI.  8.  ?? Hypothyroidism ruled out. -Synthroid not noted on MAR. -Spoke with PCP and patient not on thyroid replacement therapy. -TSH within normal limits of 0.461. -No further thyroid work-up needed at this time. -Outpatient follow-up with PCP.   DVT prophylaxis: Heparin Code Status: Full Family Communication: Updated Dr Wilford Grist, and PCP on telephone.  Disposition: TBD.  SNF when medically stable, resolution of hypoxia and acute CHF exacerbation hopefully in the next 2 to 3 days..   Status is: Inpatient Remains inpatient appropriate because: Severity of illness   Consultants:  Admitted to PCCM. Cardiology: Dr. Johnsie Cancel 10/13/2020  Procedures:  CT head CT C-spine 10/11/2021 Chest x-ray 10/11/2021 2D echo 10/12/2021   Significant Hospital Events: Including procedures, antibiotic start and stop dates in addition to other pertinent events   8/31 Presented to [facility] ED via EMS after fall at home. LifeAlert called EMS. CT Head/Cspine negative. Hypotension with SBPs in 90s and MAPs in 50s.  Fluids given without significant improvement 9/1 Pressors initiated peripherally. PCCM consulted for ICU admission. 9/1 CTX started for UTI 9/1 Troponin elevation to 504   Antimicrobials:  IV Rocephin 10/12/2021>>>>> 10/16/2021 Omnicef 10/16/2021>>>>   Subjective: Laying in bed, slightly visibly short of breath.  Complaining of shortness of breath.  Denies any chest pain.  No abdominal pain.  Patient noted to have sats of 85% on room air this morning, sats of 87% on ambulation, sats of 95% on 2 L.   Objective: Vitals:   10/16/21 0710 10/16/21 0718 10/16/21 1209 10/16/21 1600  BP: (!) 126/56  (!) 137/55 (!) 139/49  Pulse: (!) 50 (!) 50    Resp:   20   Temp:   98.5 F (36.9 C) 97.7 F (36.5 C)  TempSrc:   Oral   SpO2: 96% 96% (!) 89% 95%  Weight:      Height:        Intake/Output Summary (Last 24 hours) at 10/16/2021 1725 Last data filed at 10/16/2021 1600 Gross per 24 hour  Intake 581.16 ml  Output 4200 ml  Net -3618.84 ml   Filed Weights   10/14/21 0500 10/15/21 0500 10/16/21 0248  Weight: 70 kg 68.9 kg 70.7 kg    Examination:  General exam: NAD Respiratory system: Diffuse crackles noted.  No wheezing.  No rhonchi.  Slightly visibly short of breath.  No use of accessory  muscles of respiration.  Cardiovascular system: RRR no murmurs rubs or gallops.  No JVD.  No lower extremity edema.  Gastrointestinal system: Abdomen is soft, nontender, nondistended, positive bowel sounds.  No rebound.  No guarding.  Central nervous system: Alert and oriented.  Moving extremities spontaneously.  No focal neurological deficits.   Extremities: Symmetric 5 x 5 power. Skin: No rashes, lesions or ulcers Psychiatry: Judgement and insight appear fair. Mood & affect appropriate.     Data Reviewed: I have personally reviewed following labs and imaging studies  CBC: Recent Labs  Lab 10/11/21 2133 10/12/21 0627 10/15/21 0226 10/16/21 0235  WBC 10.9* 8.5 6.3 6.8  NEUTROABS 9.0*  --   --  4.2   HGB 9.8* 8.6* 8.4* 8.6*  HCT 31.1* 26.3* 26.5* 26.6*  MCV 100.6* 97.4 100.0 97.1  PLT 267 293 246 130    Basic Metabolic Panel: Recent Labs  Lab 10/12/21 0045 10/12/21 0627 10/12/21 1610 10/13/21 0026 10/14/21 0937 10/15/21 0226 10/16/21 0235  NA  --  140 137 138 135 139 139  K  --  3.0* 4.4 4.0 4.2 4.1 4.0  CL  --  103 106 104 103 105 102  CO2  --  '26 22 25 25 25 28  ' GLUCOSE  --  142* 109* 218* 184* 129* 97  BUN  --  55* 50* 49* 47* 48* 37*  CREATININE  --  1.70* 1.55* 1.65* 1.81* 1.86* 1.49*  CALCIUM  --  9.6 9.2 9.7 9.9 10.0 10.5*  MG 1.1* 3.5*  --   --  2.4  --   --   PHOS  --  3.6  --   --   --   --   --     GFR: Estimated Creatinine Clearance: 21.5 mL/min (A) (by C-G formula based on SCr of 1.49 mg/dL (H)).  Liver Function Tests: No results for input(s): "AST", "ALT", "ALKPHOS", "BILITOT", "PROT", "ALBUMIN" in the last 168 hours.  CBG: Recent Labs  Lab 10/15/21 1602 10/15/21 2107 10/16/21 0648 10/16/21 1117 10/16/21 1600  GLUCAP 140* 107* 95 136* 117*     Recent Results (from the past 240 hour(s))  MRSA Next Gen by PCR, Nasal     Status: None   Collection Time: 10/12/21  2:41 AM   Specimen: Nasal Mucosa; Nasal Swab  Result Value Ref Range Status   MRSA by PCR Next Gen NOT DETECTED NOT DETECTED Final    Comment: (NOTE) The GeneXpert MRSA Assay (FDA approved for NASAL specimens only), is one component of a comprehensive MRSA colonization surveillance program. It is not intended to diagnose MRSA infection nor to guide or monitor treatment for MRSA infections. Test performance is not FDA approved in patients less than 37 years old. Performed at [facility], [place] [address], [place], [place] [address]   Urine Culture     Status: None   Collection Time: 10/12/21 11:19 AM   Specimen: Urine, Clean Catch  Result Value Ref Range Status   Specimen Description URINE, CLEAN CATCH  Final   Special Requests NONE  Final   Culture   Final    NO  GROWTH Performed at [facility], [place] [address] [place], [place] [address]    Report Status 10/13/2021 FINAL  Final  Culture, blood (Routine X 2) w Reflex to ID Panel     Status: None (Preliminary result)   Collection Time: 10/12/21 11:50 AM   Specimen: BLOOD  Result Value Ref Range Status  Specimen Description BLOOD RIGHT ANTECUBITAL  Final   Special Requests   Final    BOTTLES DRAWN AEROBIC AND ANAEROBIC Blood Culture adequate volume   Culture   Final    NO GROWTH 4 DAYS Performed at [facility], [address], [place], [place] [address]    Report Status PENDING  Incomplete  Culture, blood (Routine X 2) w Reflex to ID Panel     Status: None (Preliminary result)   Collection Time: 10/12/21 12:05 PM   Specimen: BLOOD RIGHT HAND  Result Value Ref Range Status   Specimen Description BLOOD RIGHT HAND  Final   Special Requests   Final    BOTTLES DRAWN AEROBIC AND ANAEROBIC Blood Culture adequate volume   Culture   Final    NO GROWTH 4 DAYS Performed at [facility], [place] [address] [place], [place] [address]    Report Status PENDING  Incomplete         Radiology Studies: DG Chest 2 View  Result Date: 10/15/2021 CLINICAL DATA:  Hypoxia EXAM: CHEST - 2 VIEW COMPARISON:  Previous studies including the examination of 10/11/2021 FINDINGS: Transverse diameter of heart is increased. Central pulmonary vessels are more prominent. Increased interstitial markings are seen in the parahilar regions. There is blunting of right lateral CP angle. There is no pneumothorax. Pacemaker battery is seen in the left infraclavicular region. IMPRESSION: Cardiomegaly. Central pulmonary vessels are more prominent suggesting CHF. Small right pleural effusion. Electronically Signed   By: Elmer Picker M.D.   On: 10/15/2021 16:00        Scheduled Meds:  aspirin EC  81 mg Oral Daily   cefdinir  300 mg Oral Daily   DULoxetine  30 mg Oral Daily   heparin  5,000 Units  Subcutaneous Q8H   insulin aspart  0-9 Units Subcutaneous TID WC   pantoprazole  20 mg Oral Daily   polyvinyl alcohol  1 drop Both Eyes TID   rosuvastatin  40 mg Oral Daily   Continuous Infusions:  sodium chloride Stopped (10/15/21 2139)     LOS: 4 days    Time spent: 40 minutes    Irine Seal, MD [facility]   To contact the attending provider between 7A-7P or the covering provider during after hours 7P-7A, please log into the web site [email or web address] and access using universal [place] password for that web site. If you do not have the password, please call the hospital operator.  10/16/2021, 5:25 PM

## 2021-10-16 NOTE — TOC Initial Note (Addendum)
Transition of Care [facility]) - Initial/Assessment Note    Patient Details  Name: Evelyn Simmons MRN: [id number] Date of Birth: 1929-04-27  [facility] CM/SW Contact:    Bethann Berkshire, [place] Phone Number: 10/16/2021, 1:52 PM  Clinical Narrative:            Juliann Pulse Self with [facility] presented at nursing station and asked to speak with CSW. She explained she is pt's Consulting civil engineer with [facility] and that she came to meet with pt. She states that pt is not like herself and has limited support at home. She states that [facility] believes pt needs more support and would like to seek respite care at a SNF. They would like CSW to complete fl2 and fax bed requests to SNF's in [place] area. Juliann Pulse explains that pt was agreeable when she was just in room with pt. Juliann Pulse explained for CSW to call [facility] social worker Quinn Axe at [phone] as Thayer Headings would be able to provide more details about pt's home situation.   CSW met with pt to confirm that she is agreeable to go to SNF. Pt states she is not wanting to go to SNF. She states she lives at home a lone and has aides that come on Mondays and Thursdays. She states she also goes to [facility] day program Monday- Friday. Pt has a daughter who is 37 years old in [place]. She states that daughter still works but visit's pt about 2x/week and helps pt get groceries. Pt gets around with a rollator at home. She is adamant about returning home. She state's " just because I fell once doesn't mean I'm going to be all over the place. I fell because my blood sugar was low." CSW explained he is going to discuss further with [facility] Education officer, museum. Pt states, "Tell her to just let me go home!" While clapping her hand on recliner armrest. Pt is very pleasant through conversation though appropriately assertive in that she wants to return home at DC. If pt were to DC home, she should be able to get PT/OT daily at [facility] day program.  CSW called Quinn Axe [phone]; no answer, left  voicemail requesting return call.   1509: CSW received call back from [facility]. Is informed that they are concerned about pt's cognitive deficits and that they have concerns about pt returning home. They are hoping for respite care at a SNF with the idea that pt's cognition will improved. CSW informed Thayer Headings that pt was oriented x4 and seemed fairly sharp for her 88 Thayer Headings explained that pt has had delusions and hallucinations on and off and that they don't think pt can manage herself at home. Thayer Headings plans to speak with pt's daughter to help encourage pt to go to SNF. CSW did explain that pt is recommended HH by PT. CSW updated MD and RN.   Expected Discharge Plan: [place] Barriers to Discharge: Continued Medical Work up   Patient Goals and CMS Choice        Expected Discharge Plan and Services Expected Discharge Plan: [place] arrangements for the past 2 months: Single Family Home                                      Prior Living Arrangements/Services Living arrangements for the past 2 months: [place]  Lives with:: Self Patient language and need for interpreter reviewed:: Yes        Need for Family Participation in Patient Care: No (Comment) Care giver support system in place?: Yes (comment)   Criminal Activity/Legal Involvement Pertinent to Current Situation/Hospitalization: No - Comment as needed  Activities of Daily Living Home Assistive Devices/Equipment: Bedside commode/3-in-1, Walker (specify type) ADL Screening (condition at time of admission) Patient's cognitive ability adequate to safely complete daily activities?: No Is the patient deaf or have difficulty hearing?: No Does the patient have difficulty seeing, even when wearing glasses/contacts?: Yes Does the patient have difficulty concentrating, remembering, or making decisions?: Yes Patient able to express need for assistance with ADLs?:  Yes Does the patient have difficulty dressing or bathing?: Yes Independently performs ADLs?: No Communication: Independent Dressing (OT): Needs assistance Is this a change from baseline?: Pre-admission baseline Grooming: Needs assistance Is this a change from baseline?: Pre-admission baseline Feeding: Independent Bathing: Needs assistance Is this a change from baseline?: Pre-admission baseline Toileting: Needs assistance Is this a change from baseline?: Pre-admission baseline In/Out Bed: Needs assistance Is this a change from baseline?: Pre-admission baseline Walks in Home: Needs assistance Is this a change from baseline?: Pre-admission baseline Does the patient have difficulty walking or climbing stairs?: Yes Weakness of Legs: Both Weakness of Arms/Hands: Both  Permission Sought/Granted   Permission granted to share information with : Yes, Verbal Permission Granted     Permission granted to share info w AGENCY: [facility]        Emotional Assessment Appearance:: Appears stated age Attitude/Demeanor/Rapport: Engaged Affect (typically observed): Accepting, Pleasant Orientation: : Oriented to Self, Oriented to Place, Oriented to  Time, Oriented to Situation Alcohol / Substance Use: Not Applicable Psych Involvement: No (comment)  Admission diagnosis:  Syncope and collapse [R55] Hypotension [I95.9] Fall, initial encounter [W19.XXXA] Hypotension, unspecified hypotension type [I95.9] Patient Active Problem List   Diagnosis Date Noted   Hypotension 10/12/2021   Septic shock ([place])    Acute cystitis without hematuria    Complete heart block ([place]) 12/13/2019   Black stools    Acute on chronic heart failure with preserved ejection fraction (HFpEF) (HCC)    Acute on chronic systolic CHF (congestive heart failure) ([place]) 10/18/2019   Diabetes mellitus type 2 in obese ([place]) 10/18/2019   Anemia due to chronic kidney disease 10/18/2019   COPD (chronic obstructive pulmonary disease) ([place])  10/18/2019   Toe pain, right 10/18/2019   Persistent atrial fibrillation (HCC)    Acute CHF (congestive heart failure) ([place]) 10/04/2019   Chronic respiratory failure with hypoxia (HCC)    CHF (congestive heart failure) ([place]) 09/18/2019   Atrial fibrillation with rapid ventricular response ([place]) 16/11/9602   Acute diastolic CHF (congestive heart failure) ([place]) 09/17/2019   Mobility impaired 05/29/2014   CAD (coronary artery disease) 05/09/2014   History of coronary artery stent placement 05/09/2014   Hyperlipidemia 05/09/2014   Bilateral low back pain with left-sided sciatica 04/21/2014   Knee effusion 04/21/2014   CKD stage 3 due to type 2 diabetes mellitus ([place]) 04/21/2014   Pacemaker 04/21/2014   Encounter to establish care 04/20/2014   PCP:  Dionicia Abler, NP (Inactive) Pharmacy:   [facility], [place] [address] Phone: [phone] Fax: [phone]     Social Determinants of Health (SDOH) Interventions    Readmission Risk Interventions     No data to display

## 2021-10-17 ENCOUNTER — Inpatient Hospital Stay (HOSPITAL_COMMUNITY)
Admission: EM | Disposition: A | Discharge status: Skilled Nursing Facility | Payer: Self-pay | Source: Home / Self Care | Attending: Internal Medicine

## 2021-10-17 DIAGNOSIS — I950 Idiopathic hypotension: Secondary | ICD-10-CM | POA: Diagnosis not present

## 2021-10-17 DIAGNOSIS — I442 Atrioventricular block, complete: Secondary | ICD-10-CM | POA: Diagnosis not present

## 2021-10-17 HISTORY — PX: PPM GENERATOR CHANGEOUT: EP1233

## 2021-10-17 LAB — CULTURE, BLOOD (ROUTINE X 2)
Culture: NO GROWTH
Culture: NO GROWTH
Special Requests: ADEQUATE
Special Requests: ADEQUATE

## 2021-10-17 LAB — CBC WITH DIFFERENTIAL/PLATELET
Abs Immature Granulocytes: 0.01 10*3/uL (ref 0.00–0.07)
Basophils Absolute: 0.1 10*3/uL (ref 0.0–0.1)
Basophils Relative: 1 %
Eosinophils Absolute: 0.1 10*3/uL (ref 0.0–0.5)
Eosinophils Relative: 2 %
HCT: 29.7 % — ABNORMAL LOW (ref 36.0–46.0)
Hemoglobin: 9.3 g/dL — ABNORMAL LOW (ref 12.0–15.0)
Immature Granulocytes: 0 %
Lymphocytes Relative: 17 %
Lymphs Abs: 1.1 10*3/uL (ref 0.7–4.0)
MCH: 30.6 pg (ref 26.0–34.0)
MCHC: 31.3 g/dL (ref 30.0–36.0)
MCV: 97.7 fL (ref 80.0–100.0)
Monocytes Absolute: 1 10*3/uL (ref 0.1–1.0)
Monocytes Relative: 16 %
Neutro Abs: 4 10*3/uL (ref 1.7–7.7)
Neutrophils Relative %: 64 %
Platelets: 316 10*3/uL (ref 150–400)
RBC: 3.04 MIL/uL — ABNORMAL LOW (ref 3.87–5.11)
RDW: 14.8 % (ref 11.5–15.5)
WBC: 6.3 10*3/uL (ref 4.0–10.5)
nRBC: 0 % (ref 0.0–0.2)

## 2021-10-17 LAB — BASIC METABOLIC PANEL
Anion gap: 10 (ref 5–15)
BUN: 31 mg/dL — ABNORMAL HIGH (ref 8–23)
CO2: 31 mmol/L (ref 22–32)
Calcium: 10.5 mg/dL — ABNORMAL HIGH (ref 8.9–10.3)
Chloride: 95 mmol/L — ABNORMAL LOW (ref 98–111)
Creatinine, Ser: 1.53 mg/dL — ABNORMAL HIGH (ref 0.44–1.00)
GFR, Estimated: 32 mL/min — ABNORMAL LOW (ref >60)
Glucose, Bld: 152 mg/dL — ABNORMAL HIGH (ref 70–99)
Potassium: 3.6 mmol/L (ref 3.5–5.1)
Sodium: 136 mmol/L (ref 135–145)

## 2021-10-17 LAB — SURGICAL PCR SCREEN
MRSA, PCR: NEGATIVE
Staphylococcus aureus: NEGATIVE

## 2021-10-17 LAB — GLUCOSE, CAPILLARY
Glucose-Capillary: 94 mg/dL (ref 70–99)
Glucose-Capillary: 130 mg/dL — ABNORMAL HIGH (ref 70–99)
Glucose-Capillary: 107 mg/dL — ABNORMAL HIGH (ref 70–99)

## 2021-10-17 LAB — MAGNESIUM: Magnesium: 1.7 mg/dL (ref 1.7–2.4)

## 2021-10-17 SURGERY — PPM GENERATOR CHANGEOUT

## 2021-10-17 MED ORDER — SODIUM CHLORIDE 0.9 % IV SOLN: INTRAVENOUS | Status: DC

## 2021-10-17 MED ORDER — LIDOCAINE HCL (PF) 1 % IJ SOLN
INTRAMUSCULAR | Status: DC | PRN
  Administered 2021-10-17: 50 mL

## 2021-10-17 MED ORDER — VANCOMYCIN HCL IN DEXTROSE 1-5 GM/200ML-% IV SOLN
INTRAVENOUS | Status: AC
  Filled 2021-10-17: qty 200

## 2021-10-17 MED ORDER — CHLORHEXIDINE GLUCONATE 4 % EX LIQD
60.0000 mL | Freq: Once | CUTANEOUS | Status: DC
  Filled 2021-10-17: qty 60

## 2021-10-17 MED ORDER — ACETAMINOPHEN 325 MG PO TABS: 325.0000 mg | ORAL_TABLET | ORAL | Status: DC | PRN

## 2021-10-17 MED ORDER — VANCOMYCIN HCL IN DEXTROSE 1-5 GM/200ML-% IV SOLN
1000.0000 mg | INTRAVENOUS | Status: AC
  Administered 2021-10-17: 1000 mg via INTRAVENOUS
  Filled 2021-10-17: qty 200

## 2021-10-17 MED ORDER — SODIUM CHLORIDE 0.9 % IV SOLN: INTRAVENOUS | Status: AC

## 2021-10-17 MED ORDER — ASPIRIN 81 MG PO TBEC
81.0000 mg | DELAYED_RELEASE_TABLET | Freq: Every day | ORAL | Status: DC
Start: 2021-10-18 — End: 2021-10-18
  Administered 2021-10-18: 81 mg via ORAL
  Filled 2021-10-17: qty 1

## 2021-10-17 MED ORDER — SODIUM CHLORIDE 0.9 % IV SOLN
80.0000 mg | INTRAVENOUS | Status: AC
  Administered 2021-10-17: 80 mg
  Filled 2021-10-17: qty 2

## 2021-10-17 MED ORDER — CEFAZOLIN SODIUM-DEXTROSE 2-4 GM/100ML-% IV SOLN
INTRAVENOUS | Status: AC
  Filled 2021-10-17: qty 100

## 2021-10-17 MED ORDER — SODIUM CHLORIDE 0.9 % IV SOLN
INTRAVENOUS | Status: AC
  Filled 2021-10-17: qty 2

## 2021-10-17 MED ORDER — LIDOCAINE HCL 1 % IJ SOLN
INTRAMUSCULAR | Status: AC
  Filled 2021-10-17: qty 60

## 2021-10-17 SURGICAL SUPPLY — 4 items
CABLE SURGICAL S-101-97-12 (CABLE) ×1 IMPLANT
PACEMAKER ACCOLADE GR (Pacemaker) IMPLANT
PAD DEFIB RADIO PHYSIO CONN (PAD) ×1 IMPLANT
TRAY PACEMAKER INSERTION (PACKS) ×1 IMPLANT

## 2021-10-17 NOTE — H&P (View-Only) (Signed)
ELECTROPHYSIOLOGY CONSULT NOTE    Patient ID: Evelyn Simmons MRN: [id number], DOB/AGE: 1929/07/10 88 y.o.  Admit date: 10/11/2021 Date of Consult: 10/17/2021  Primary Physician: Dionicia Abler, NP (Inactive) Primary Cardiologist: Virl Axe, MD  Electrophysiologist: Dr. Caryl Comes  Referring Provider: Dr. Tyrell Antonio  Patient Profile: Evelyn Simmons is a 88 y.o. female with a history of SSS s/p [employer] DDD PPM 2014, s/p AV nodal ablation 12/2019, not on [place] due to recurrent GI bleeding, mild/mod MR, and chronic dyspnea who is being seen today for the evaluation of Pacemaker End of Service at the request of Dr. Tyrell Antonio.  HPI:  Evelyn Simmons is a 88 y.o. female with medical history as above.   Pt presented for altered MS and hypotension, felt to have possible sepsis and UTI.   UCx negative, BCx. Slightly HypOthermic but not fever.  Lactate normal UA nitrite negative moderate leukocytes BNP 465 BUN 55 Cr 1.7 Hct 26.3 WBC 8.5 Troponin 504   Pt was brought to our attention by [employer] rep who had interrogated her device earlier in the admission and noted that she has met EOS.   She remains on Cefdinir for one additional dose.   She is slumped over in her chair when we initially enter, but quickly perks up and participates in the conversation, though doesn't remember Dr. Caryl Comes despite many visits over the years.    Past Medical History:  Diagnosis Date   (HFmrEF) heart failure with mid-range ejection fraction ([place])    a. 09/2019 Echo: EF 45-50%, glob HK, mild conc LVH, mildly elev PASP. Sev dil LA. Mildly dil RA. Mild MR.   Anemia    Arthritis    Asthma    CAD (coronary artery disease)    a. 1995 s/p PCI/BMS LAD; b. 2011 s/p PCI/DES RCA.   CHF (congestive heart failure) (HCC)    Chronic kidney disease    COPD (chronic obstructive pulmonary disease) (HCC)    Depression    Diabetes mellitus without complication (HCC)    Diverticulitis    Dysrhythmia    GERD (gastroesophageal  reflux disease)    Hyperlipidemia    Hypertension    Hypothyroid    pt unsure, not currently on medication   Ischemic cardiomyopathy    MI (myocardial infarction) ([place])    1995   Persistent atrial fibrillation ([place])    a. No OAC 2/2 h/o bleeding/anemia; b. Longterm amio therapy; c. 08/2019 - recurrent afib-->rate-controlled.   Presence of permanent cardiac pacemaker    Radiculopathy      Surgical History:  Past Surgical History:  Procedure Laterality Date   ABDOMINAL HYSTERECTOMY     APPENDECTOMY     AV NODE ABLATION N/A 12/13/2019   Procedure: AV NODE ABLATION;  Surgeon: Deboraha Sprang, MD;  Location: [facility];  Service: Cardiovascular;  Laterality: N/A;   CARDIAC CATHETERIZATION     COLONOSCOPY WITH PROPOFOL N/A 10/21/2019   Procedure: COLONOSCOPY WITH PROPOFOL;  Surgeon: Lin Landsman, MD;  Location: [facility] ENDOSCOPY;  Service: Gastroenterology;  Laterality: N/A;   CORONARY ANGIOPLASTY WITH STENT PLACEMENT  12/11/1993   RCA   ESOPHAGOGASTRODUODENOSCOPY (EGD) WITH PROPOFOL N/A 10/21/2019   Procedure: ESOPHAGOGASTRODUODENOSCOPY (EGD) WITH PROPOFOL;  Surgeon: Lin Landsman, MD;  Location: [facility];  Service: Gastroenterology;  Laterality: N/A;   FOOT SURGERY Bilateral    Over 25 years ago- Bunionectomy   GIVENS CAPSULE STUDY N/A 10/22/2019   Procedure: GIVENS CAPSULE STUDY;  Surgeon: Lin Landsman, MD;  Location:  [facility] ENDOSCOPY;  Service: Gastroenterology;  Laterality: N/A;   INSERT / REPLACE / REMOVE PACEMAKER  09/2012   Model # A128  serial # [id number]   JOINT REPLACEMENT     bilateral knee replacements     Medications Prior to Admission  Medication Sig Dispense Refill Last Dose   acetaminophen (TYLENOL) 650 MG CR tablet SMARTSIG:1 Tablet(s) By Mouth Twice Daily   10/11/2021   antiseptic oral rinse (BIOTENE) LIQD 10 mLs by Mouth Rinse route 2 (two) times daily as needed for dry mouth.   Past Week   aspirin EC 81 MG tablet Take 81 mg by mouth daily.    10/11/2021   ATIVAN 0.5 MG tablet Take 0.5 mg by mouth daily.   10/11/2021   cholecalciferol (VITAMIN D) 1000 units tablet Take 1,000 Units by mouth daily.   10/11/2021   CIPRO 500 MG tablet Take 500 mg by mouth 2 (two) times daily.   10/11/2021   diclofenac Sodium (VOLTAREN) 1 % GEL Apply 1 application topically 4 (four) times daily as needed (left knee pain.).    Past Month   DULoxetine (CYMBALTA) 30 MG capsule Take 30 mg by mouth daily.   10/11/2021   famotidine (PEPCID) 20 MG tablet Take 20 mg by mouth daily.   10/11/2021   FEROSUL 325 (65 Fe) MG tablet Take 325 mg by mouth daily with breakfast.   10/11/2021   furosemide (LASIX) 20 MG tablet Take 20 mg by mouth daily as needed for fluid.   10/11/2021   lidocaine (LIDODERM) 5 % Place 1 patch onto the skin daily as needed (knee pain.). Remove & Discard patch within 12 hours or as directed by MD   Past Week   loperamide (IMODIUM) 2 MG capsule Take 2 mg by mouth daily.   10/11/2021   loratadine (CLARITIN) 10 MG tablet Take 10 mg by mouth daily.   10/11/2021   MACROBID 100 MG capsule Take 100 mg by mouth 2 (two) times daily.   10/11/2021   metolazone (ZAROXOLYN) 2.5 MG tablet 2.5 mg every Monday, Wednesday, and Friday.   Past Week   Multiple Vitamins-Minerals (PRESERVISION AREDS 2+MULTI VIT) CAPS Take 1 capsule by mouth 2 (two) times daily.   10/11/2021   naloxone (NARCAN) nasal spray 4 mg/0.1 mL Place 1 spray into the nose See admin instructions. Administer 1 spray in one nostril. Repeat every 3 minutes if no response.   unk   nitroGLYCERIN (NITROSTAT) 0.4 MG SL tablet Place 0.4 mg under the tongue every 5 (five) minutes x 3 doses as needed for chest pain.    unk   nystatin-triamcinolone (MYCOLOG II) cream Apply 1 application topically 3 (three) times daily as needed (affected area(s)).   Past Month   pantoprazole (PROTONIX) 20 MG tablet Take 20 mg by mouth daily.   10/11/2021   potassium chloride (KLOR-CON) 20 MEQ packet Take 20 mEq by mouth daily.    10/11/2021   QUEtiapine (SEROQUEL) 25 MG tablet Take 25 mg by mouth at bedtime.    10/11/2021   rosuvastatin (CRESTOR) 40 MG tablet Take 1 tablet (40 mg total) by mouth daily. 90 tablet 3 10/11/2021   TAMIFLU 30 MG capsule Take 30 mg by mouth every other day.   unk   torsemide (DEMADEX) 20 MG tablet Take 2 tablets (40 mg) by mouth once daily   10/11/2021   traMADol (ULTRAM) 50 MG tablet Take 50 mg by mouth every 6 (six) hours as needed for pain.   Past Month  traZODone (DESYREL) 100 MG tablet Take 100 mg by mouth at bedtime.   10/11/2021   TUSSIN COUGH+CHEST CONGEST DM 10-100 MG/5ML liquid Take 10 mLs by mouth at bedtime as needed for cough.   unk   TYLENOL 325 MG tablet Take 325 mg by mouth daily.   10/11/2021   vitamin B-12 1000 MCG tablet Take 1 tablet (1,000 mcg total) by mouth daily. 30 tablet 1 10/11/2021   Blood Glucose Monitoring Suppl (ACCU-CHEK NANO SMARTVIEW) W/DEVICE KIT Please issue nano meter by AccuChek along with test strips at checking frequency of 2x daily. 90 day supply. E11.9 1 kit 1    glucose blood (FREESTYLE LITE) test strip Use as instructed 200 each 3    Lancets (FREESTYLE) lancets Use as instructed 200 each 3    polyethylene glycol (MIRALAX / GLYCOLAX) 17 g packet Take 17 g by mouth daily as needed (constipation.).  (Patient not taking: Reported on 10/12/2021)   Not Taking    Inpatient Medications:   aspirin EC  81 mg Oral Daily   cefdinir  300 mg Oral Daily   DULoxetine  30 mg Oral Daily   insulin aspart  0-9 Units Subcutaneous TID WC   pantoprazole  20 mg Oral Daily   polyvinyl alcohol  1 drop Both Eyes TID   rosuvastatin  40 mg Oral Daily    Allergies:  Allergies  Allergen Reactions   Penicillins Nausea And Vomiting, Rash and Hives   Other Other (See Comments)   Sulfa Antibiotics     Pt unsure reaction, was told in hospital she had allergy   Penicillin G Rash    Social History   Socioeconomic History   Marital status: Widowed    Spouse name: Not on file    Number of children: Not on file   Years of education: Not on file   Highest education level: Not on file  Occupational History   Not on file  Tobacco Use   Smoking status: Former    Types: Cigarettes    Quit date: 12/10/1993    Years since quitting: 27.8   Smokeless tobacco: Former  Scientific laboratory technician Use: Never used  Substance and Sexual Activity   Alcohol use: Yes    Alcohol/week: 0.0 standard drinks of alcohol    Comment: Rare   Drug use: No   Sexual activity: Not Currently  Other Topics Concern   Not on file  Social History Narrative   Moved from [place] with eldest daughter   7 children, 11 grandchildren, 72 GG children    Social Determinants of Radio broadcast assistant Strain: Not on file  Food Insecurity: Not on file  Transportation Needs: Not on file  Physical Activity: Not on file  Stress: Not on file  Social Connections: Not on file  Intimate Partner Violence: Not on file     Family History  Problem Relation Age of Onset   Arthritis Mother    Heart disease Mother    Hypertension Mother    Diabetes Mother    Arthritis Father    Heart disease Father    Hypertension Father    Cancer Sister        breast cancer   Diabetes Brother    Cancer Daughter        lung cancer   Diabetes Sister      Review of Systems: All other systems reviewed and are otherwise negative except as noted above.  Physical Exam: Vitals:  10/16/21 1942 10/16/21 2000 10/16/21 2340 10/17/21 0456  BP: (!) 127/50 (!) 124/45 (!) 130/52 (!) 122/51  Pulse: (!) 50 (!) 50 (!) 50 (!) 49  Resp: '20  20 20  ' Temp: 98.2 F (36.8 C)  (!) 97.4 F (36.3 C) 98 F (36.7 C)  TempSrc: Oral  Oral Oral  SpO2: 98% 96% 95% 96%  Weight:    67.3 kg  Height:        GEN- The patient is elderly appearing, alert and oriented x 3 today.   HEENT: normocephalic, atraumatic; sclera clear, conjunctiva pink; hearing intact; oropharynx clear; neck supple Lungs- Clear to ausculation  bilaterally, normal work of breathing.  No wheezes, rales, rhonchi Heart- Regular rate and rhythm, no murmurs, rubs or gallops GI- soft, non-tender, non-distended, bowel sounds present Extremities- no clubbing, cyanosis, or edema; DP/PT/radial pulses 2+ bilaterally MS- no significant deformity or atrophy Skin- warm and dry, no rash or lesion Psych- euthymic mood, full affect Neuro- strength and sensation are intact  Labs:   Lab Results  Component Value Date   WBC 6.8 10/16/2021   HGB 8.6 (L) 10/16/2021   HCT 26.6 (L) 10/16/2021   MCV 97.1 10/16/2021   PLT 296 10/16/2021    Recent Labs  Lab 10/16/21 0235  NA 139  K 4.0  CL 102  CO2 28  BUN 37*  CREATININE 1.49*  CALCIUM 10.5*  GLUCOSE 97      Radiology/Studies: DG Chest 2 View  Result Date: 10/15/2021 CLINICAL DATA:  Hypoxia EXAM: CHEST - 2 VIEW COMPARISON:  Previous studies including the examination of 10/11/2021 FINDINGS: Transverse diameter of heart is increased. Central pulmonary vessels are more prominent. Increased interstitial markings are seen in the parahilar regions. There is blunting of right lateral CP angle. There is no pneumothorax. Pacemaker battery is seen in the left infraclavicular region. IMPRESSION: Cardiomegaly. Central pulmonary vessels are more prominent suggesting CHF. Small right pleural effusion. Electronically Signed   By: Elmer Picker M.D.   On: 10/15/2021 16:00   ECHOCARDIOGRAM COMPLETE  Result Date: 10/12/2021    ECHOCARDIOGRAM REPORT   Patient Name:   Evelyn Simmons Date of Exam: 10/12/2021 Medical Rec #:  [id number]     Height:       59.0 in Accession #:    [id number]    Weight:       147.7 lb Date of Birth:  1929/05/10      BSA:          1.621 m Patient Age:    40 years      BP:           116/44 mmHg Patient Gender: F             HR:           58 bpm. Exam Location:  Inpatient Procedure: 2D Echo, Cardiac Doppler, Color Doppler and Intracardiac            Opacification Agent                                 MODIFIED REPORT:  This report was modified by Cherlynn Kaiser MD on 10/12/2021 due to report send                                     to EMR.  Indications:  Cardiomyopathy  History:         Patient has prior history of Echocardiogram examinations, most                  recent 12/12/2020. CHF, CAD, Pacemaker, Arrythmias:Atrial                  Fibrillation; Risk Factors:Diabetes, Dyslipidemia and Former                  Smoker.  Sonographer:     Clayton Lefort RDCS (AE) Referring Phys:  [id number] Lowella Dell REESE Diagnosing Phys: Cherlynn Kaiser MD  Sonographer Comments: Technically difficult study due to poor echo windows and no subcostal window obtained. IMPRESSIONS  1. The mitral valve is degenerative. Severe mitral valve regurgitation, posteriorly directed with posterior leaflet restriction and anterior leaflet override. No evidence of mitral stenosis. Severe mitral annular calcification.  2. Left ventricular ejection fraction, by estimation, is 45 to 50%. The left ventricle has mildly decreased function. The left ventricle demonstrates regional wall motion abnormalities (see scoring diagram/findings for description). Left ventricular diastolic parameters are indeterminate.  3. Right ventricular systolic function is mildly reduced. The right ventricular size is moderately enlarged.  4. Left atrial size was severely dilated.  5. The aortic valve is abnormal. There is severe calcifcation of the aortic valve. Aortic valve regurgitation is trivial. Mild aortic valve stenosis. Aortic valve mean gradient measures 8.0 mmHg. FINDINGS  Left Ventricle: Left ventricular ejection fraction, by estimation, is 45 to 50%. The left ventricle has mildly decreased function. The left ventricle demonstrates regional wall motion abnormalities. Definity contrast agent was given IV to delineate the left ventricular endocardial borders. The left ventricular internal cavity size was normal in size. There is no left ventricular  hypertrophy. Left ventricular diastolic parameters are indeterminate.  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 The basal anteroseptal segment and basal inferoseptal segment are akinetic. Right Ventricle: The right ventricular size is moderately enlarged. No increase in right ventricular wall thickness. Right ventricular systolic function is mildly reduced. Left Atrium: Left atrial size was severely dilated. Right Atrium: Right atrial size was normal in size. Pericardium: There is no evidence of pericardial effusion. Mitral Valve: The mitral valve is degenerative in appearance. Severe mitral annular calcification. Severe mitral valve regurgitation. No evidence of mitral valve stenosis. The mean mitral valve gradient is 2.7 mmHg. Tricuspid Valve: The tricuspid valve is normal in structure. Tricuspid valve regurgitation is not demonstrated. No evidence of tricuspid stenosis. Aortic Valve: The aortic valve is abnormal. There is severe calcifcation of the aortic valve. Aortic valve regurgitation is trivial. Mild aortic stenosis is present. Aortic valve mean gradient measures 8.0 mmHg. Aortic valve peak gradient measures 15.7 mmHg. Aortic valve area, by VTI measures 0.92 cm. Pulmonic Valve: The pulmonic valve was normal in structure. Pulmonic valve regurgitation is trivial. No evidence of pulmonic stenosis. Aorta: The aortic root is normal in size and structure. Venous: The inferior vena cava was not well visualized. IAS/Shunts: The interatrial septum was not assessed.  LEFT VENTRICLE PLAX 2D LVIDd:         4.70 cm LVIDs:         3.40 cm LV PW:         1.20 cm LV IVS:        0.84 cm LVOT diam:     1.90 cm LV SV:         40 LV SV Index:   25 LVOT Area:  2.84 cm  RIGHT VENTRICLE RV Basal diam:  4.20 cm RV Mid diam:    3.60 cm RV S prime:     7.62 cm/s TAPSE (M-mode): 2.0 cm LEFT ATRIUM              Index        RIGHT ATRIUM           Index LA diam:        5.70 cm  3.52 cm/m   RA Area:     20.70 cm  LA Vol (A2C):   161.0 ml 99.29 ml/m  RA Volume:   51.00 ml  31.45 ml/m LA Vol (A4C):   144.0 ml 88.81 ml/m LA Biplane Vol: 156.0 ml 96.21 ml/m  AORTIC VALVE AV Area (Vmax):    1.01 cm AV Area (Vmean):   0.92 cm AV Area (VTI):     0.92 cm AV Vmax:           198.00 cm/s AV Vmean:          130.000 cm/s AV VTI:            0.435 m AV Peak Grad:      15.7 mmHg AV Mean Grad:      8.0 mmHg LVOT Vmax:         70.30 cm/s LVOT Vmean:        42.000 cm/s LVOT VTI:          0.141 m LVOT/AV VTI ratio: 0.32  AORTA Ao Root diam: 2.90 cm Ao Asc diam:  3.70 cm MITRAL VALVE MV Mean grad: 2.7 mmHg SHUNTS                        Systemic VTI:  0.14 m                        Systemic Diam: 1.90 cm Cherlynn Kaiser MD Electronically signed by Cherlynn Kaiser MD Signature Date/Time: 10/12/2021/5:35:26 PM    Final (Updated)    CT Head Wo Contrast  Result Date: 10/11/2021 CLINICAL DATA:  Fall, head and neck trauma. EXAM: CT HEAD WITHOUT CONTRAST CT CERVICAL SPINE WITHOUT CONTRAST TECHNIQUE: Multidetector CT imaging of the head and cervical spine was performed following the standard protocol without intravenous contrast. Multiplanar CT image reconstructions of the cervical spine were also generated. RADIATION DOSE REDUCTION: This exam was performed according to the departmental dose-optimization program which includes automated exposure control, adjustment of the mA and/or kV according to patient size and/or use of iterative reconstruction technique. COMPARISON:  04/26/2021. FINDINGS: CT HEAD FINDINGS Brain: No acute intracranial hemorrhage, midline shift or mass effect. No extra-axial fluid collection. Diffuse atrophy is noted. Subcortical and periventricular white matter hypodensities are present bilaterally. No hydrocephalus. Vascular: Atherosclerotic calcification of the carotid siphons and vertebral arteries. No hyperdense vessel. Skull: No acute fracture. Sinuses/Orbits: No acute finding. Other: None. CT CERVICAL SPINE FINDINGS  Alignment: Mild anterolisthesis at C5-C6 and C7-T1. Skull base and vertebrae: No acute fracture. Soft tissues and spinal canal: No prevertebral fluid or swelling. No visible canal hematoma. Disc levels: Multilevel intervertebral disc space narrowing, uncovertebral osteophyte formation, and facet arthropathy. Upper chest: Emphysematous changes in the lungs. Mild dependent atelectasis bilaterally. Aortic atherosclerosis. Other: Carotid artery calcifications. IMPRESSION: 1. No acute intracranial process. 2. Atrophy with chronic microvascular ischemic changes. 3. Multilevel degenerative changes in the cervical spine without evidence of acute fracture. Electronically Signed   By: Brett Fairy  M.D.   On: 10/11/2021 22:02   CT Cervical Spine Wo Contrast  Result Date: 10/11/2021 CLINICAL DATA:  Fall, head and neck trauma. EXAM: CT HEAD WITHOUT CONTRAST CT CERVICAL SPINE WITHOUT CONTRAST TECHNIQUE: Multidetector CT imaging of the head and cervical spine was performed following the standard protocol without intravenous contrast. Multiplanar CT image reconstructions of the cervical spine were also generated. RADIATION DOSE REDUCTION: This exam was performed according to the departmental dose-optimization program which includes automated exposure control, adjustment of the mA and/or kV according to patient size and/or use of iterative reconstruction technique. COMPARISON:  04/26/2021. FINDINGS: CT HEAD FINDINGS Brain: No acute intracranial hemorrhage, midline shift or mass effect. No extra-axial fluid collection. Diffuse atrophy is noted. Subcortical and periventricular white matter hypodensities are present bilaterally. No hydrocephalus. Vascular: Atherosclerotic calcification of the carotid siphons and vertebral arteries. No hyperdense vessel. Skull: No acute fracture. Sinuses/Orbits: No acute finding. Other: None. CT CERVICAL SPINE FINDINGS Alignment: Mild anterolisthesis at C5-C6 and C7-T1. Skull base and vertebrae: No  acute fracture. Soft tissues and spinal canal: No prevertebral fluid or swelling. No visible canal hematoma. Disc levels: Multilevel intervertebral disc space narrowing, uncovertebral osteophyte formation, and facet arthropathy. Upper chest: Emphysematous changes in the lungs. Mild dependent atelectasis bilaterally. Aortic atherosclerosis. Other: Carotid artery calcifications. IMPRESSION: 1. No acute intracranial process. 2. Atrophy with chronic microvascular ischemic changes. 3. Multilevel degenerative changes in the cervical spine without evidence of acute fracture. Electronically Signed   By: Brett Fairy M.D.   On: 10/11/2021 22:02   DG Chest Portable 1 View  Result Date: 10/11/2021 CLINICAL DATA:  Fall trauma EXAM: PORTABLE CHEST 1 VIEW COMPARISON:  04/26/2021 FINDINGS: Left-sided pacing device as before. Cardiomegaly with central vascular congestion. No acute airspace disease, pleural effusion or pneumothorax. Aortic atherosclerosis. IMPRESSION: Cardiomegaly with central vascular congestion. Electronically Signed   By: Donavan Foil M.D.   On: 10/11/2021 21:45    EKG: V paced at 50 (underlying AF) (personally reviewed)  TELEMETRY: V paced at 50 (underlying AF)  (personally reviewed)  DEVICE HISTORY: [employer] DDD PPM 2014  Assessment/Plan: 1.  SSS s/p [employer] DDD PPM 2014 Device is at end of service and will require generator change prior to discharge.  We discussed with patient who is agreeable to generator change today.  Explained risks, benefits, and alternatives to generator change, including but not limited to bleeding and infection. Pt verbalized understanding and agrees to proceed   2. Permananent AF 3. S/p AV nodal ablation Not on [place] with recurrent GI bleeding.   4. UTI 5. Sepsis Improved. Pending completion of ABx tomorrow.   Dr. Caryl Comes has seen.   For questions or updates, please contact [facility] Please consult [email or web address] for contact info under  Cardiology/STEMI.  Signed, Shirley Friar, PA-C  10/17/2021 9:09 AM   Pacemaker [employer] at [employer]  AF permanent  Complete heart block s/p AV ablation  UTI/sepsis syndrome with hypotension Rx antibiotics, now improved  CAD s/p stenting  GI bleeding -- thought NOT candidate for anticoagulation   There has been relatively rapid depletion of the battery over the last year and now at EOS with unpredictable longevity.  Hence, as she is no longer showing signs of systemic infection, will proceed with device generator replacement.    She does not remember me, but is aware of her hospitalization date, and made a joke congruent with our conversations and has made decisions in the last days about SNF

## 2021-10-17 NOTE — Interval H&P Note (Signed)
History and Physical Interval Note:  10/17/2021 5:13 PM  Evelyn Simmons  has presented today for surgery, with the diagnosis of heart block.  The various methods of treatment have been discussed with the patient and family. After consideration of risks, benefits and other options for treatment, the patient has consented to  Procedure(s): PPM GENERATOR CHANGEOUT (N/A) as a surgical intervention.  The patient's history has been reviewed, patient examined, no change in status, stable for surgery.  I have reviewed the patient's chart and labs.  Questions were answered to the patient's satisfaction.     Virl Axe

## 2021-10-17 NOTE — [place] FL2 (Signed)
[place] MEDICAID FL2 LEVEL OF CARE SCREENING TOOL     IDENTIFICATION  Patient Name: Evelyn Simmons Birthdate: 1929-05-18 Sex: female Admission Date (Current Location): 10/11/2021  [facility] and [place] Number:  Herbalist and Address:  [facility] [place], [place] [address] [place], [place] [address]      Provider Number: [id number]  Attending Physician Name and Address:  Elmarie Shiley, MD  Relative Name and Phone Number:  Merilyn Baba (Daughter)   [phone] [facility] Phone)    Current Level of Care: Hospital Recommended Level of Care: [place] Prior Approval Number: [id number]  Date Approved/Denied:   PASRR Number: [id number]  Discharge Plan: SNF    Current Diagnoses: Patient Active Problem List   Diagnosis Date Noted   Syncope and collapse    Hypotension 10/12/2021   Septic shock ([place])    Acute cystitis without hematuria    Complete heart block ([place]) 12/13/2019   Black stools    Acute on chronic heart failure with preserved ejection fraction (HFpEF) (HCC)    Acute on chronic systolic CHF (congestive heart failure) ([place]) 10/18/2019   Diabetes mellitus type 2 in obese ([place]) 10/18/2019   Anemia due to chronic kidney disease 10/18/2019   COPD (chronic obstructive pulmonary disease) ([place]) 10/18/2019   Toe pain, right 10/18/2019   Persistent atrial fibrillation (HCC)    Acute CHF (congestive heart failure) ([place]) 10/04/2019   Chronic respiratory failure with hypoxia (HCC)    CHF (congestive heart failure) ([place]) 09/18/2019   Atrial fibrillation with rapid ventricular response ([place]) 25/42/7062   Acute diastolic CHF (congestive heart failure) ([place]) 09/17/2019   Mobility impaired 05/29/2014   CAD (coronary artery disease) 05/09/2014   History of coronary artery stent placement 05/09/2014   Hyperlipidemia 05/09/2014   Bilateral low back pain with left-sided sciatica 04/21/2014   Knee effusion 04/21/2014   CKD stage 3 due to type  2 diabetes mellitus ([place]) 04/21/2014   Pacemaker 04/21/2014   Encounter to establish care 04/20/2014    Orientation RESPIRATION BLADDER Height & Weight     Self, Time, Situation, Place  O2 (2L) External catheter Weight: 148 lb 4.8 oz (67.3 kg) Height:  4\' 11"  (149.9 cm)  BEHAVIORAL SYMPTOMS/MOOD NEUROLOGICAL BOWEL NUTRITION STATUS      Continent Diet (see d/c summary)  AMBULATORY STATUS COMMUNICATION OF NEEDS Skin   Extensive Assist Verbally  (Buttocks, bilateral, deep tissue pressure injury)                       Personal Care Assistance Level of Assistance  Bathing, Feeding, Dressing Bathing Assistance: Limited assistance Feeding assistance: Independent Dressing Assistance: Limited assistance     Functional Limitations Info  Sight, Hearing, Speech Sight Info: Impaired Hearing Info: Impaired Speech Info: Adequate    SPECIAL CARE FACTORS FREQUENCY  PT (By licensed PT), OT (By licensed OT)     PT Frequency: 5x/week OT Frequency: 5x/week            Contractures Contractures Info: Not present    Additional Factors Info  Code Status, Allergies Code Status Info: full code Allergies Info: Penicillins, sulfa antibiotics, Penicillin G           Current Medications (10/17/2021):  This is the current hospital active medication list Current Facility-Administered Medications  Medication Dose Route Frequency Provider Last Rate Last Admin   0.9 %  sodium chloride infusion  250 mL Intravenous Continuous Spero Geralds, MD   Stopped at 10/15/21  2139   acetaminophen (TYLENOL) tablet 650 mg  650 mg Oral Q6H PRN Shela Leff, MD   650 mg at 10/14/21 2213   [START ON 10/18/2021] aspirin EC tablet 81 mg  81 mg Oral Daily Shirley Friar, PA-C       docusate sodium (COLACE) capsule 100 mg  100 mg Oral BID PRN Spero Geralds, MD       DULoxetine (CYMBALTA) DR capsule 30 mg  30 mg Oral Daily Spero Geralds, MD   30 mg at 10/17/21 3846   haloperidol lactate  (HALDOL) injection 5 mg  5 mg Intravenous Q6H PRN Spero Geralds, MD       insulin aspart (novoLOG) injection 0-9 Units  0-9 Units Subcutaneous TID WC Eugenie Filler, MD   1 Units at 10/16/21 1242   loratadine (CLARITIN) tablet 10 mg  10 mg Oral Daily PRN Spero Geralds, MD   10 mg at 10/16/21 6599   Oral care mouth rinse  15 mL Mouth Rinse PRN Spero Geralds, MD       pantoprazole (PROTONIX) EC tablet 20 mg  20 mg Oral Daily Spero Geralds, MD   20 mg at 10/17/21 0937   polyethylene glycol (MIRALAX / GLYCOLAX) packet 17 g  17 g Oral Daily PRN Spero Geralds, MD       polyvinyl alcohol (LIQUIFILM TEARS) 1.4 % ophthalmic solution 1 drop  1 drop Both Eyes TID Spero Geralds, MD   1 drop at 10/17/21 0937   rosuvastatin (CRESTOR) tablet 40 mg  40 mg Oral Daily Spero Geralds, MD   40 mg at 10/17/21 3570   sodium chloride (OCEAN) 0.65 % nasal spray 1 spray  1 spray Nasal QID PRN Spero Geralds, MD   1 spray at 10/17/21 1779     Discharge Medications: Please see discharge summary for a list of discharge medications.  Relevant Imaging Results:  Relevant Lab Results:   Additional Information [id number]  Bethann Berkshire, LCSW

## 2021-10-17 NOTE — TOC Progression Note (Addendum)
Transition of Care [facility] - Progression Note    Patient Details  Name: Evelyn Simmons MRN: [id number] Date of Birth: 10-Dec-1929  Transition of Care [facility]) CM/SW [place], [place] Phone Number: 10/17/2021, 10:34 AM  Clinical Narrative:     CSW spoke with [facility] social worker Thayer Headings who explained pt's son and daughter spoke with pt and she is agreeable to go to SNF under respite. CSW met with pt bedside and confirmed she is agreeable to SNF. CSW will complete fl2 and fax bed request in the hub.    CSW notified [facility] social worker that pt is agreeable. CSW is informed [facility] is in network with [facility] in [place] and [employer]. She states that pt's family is wanting [facility].   1200: CSW called [facility] [place] mainline and is transferred to [employer] in admissions. No answer; left voicemail requesting return call.   1206: CSW called pt's daughter and provided update. She confirmed preference for [facility]. She states she prefers DC tomorrow if possible. CSW will notify daughter when updates on SNF and DC are available.   1248: Spoke with [place] w/ [facility] admissions. They can accept pt today or tomorrow but would need to know asap if DC today. Updated MD; plan for DC to [facility] SNF tomorrow. CSW notified Ricky. Left message with daughter notifying of anticipated DC to [facility] tomorrow. Left message updating [facility] swker Thayer Headings; also requested they arrange transport for pt.   Expected Discharge Plan: [place] Barriers to Discharge: Continued Medical Work up  Expected Discharge Plan and Services Expected Discharge Plan: [place] arrangements for the past 2 months: Single Family Home                                       Social Determinants of Health (SDOH) Interventions    Readmission Risk Interventions     No data to display

## 2021-10-17 NOTE — Consult Note (Addendum)
ELECTROPHYSIOLOGY CONSULT NOTE    Patient ID: Evelyn Simmons MRN: [id number], DOB/AGE: Sep 30, 1929 88 y.o.  Admit date: 10/11/2021 Date of Consult: 10/17/2021  Primary Physician: Dionicia Abler, NP (Inactive) Primary Cardiologist: Virl Axe, MD  Electrophysiologist: Dr. Caryl Comes  Referring Provider: Dr. Tyrell Antonio  Patient Profile: Evelyn Simmons is a 88 y.o. female with a history of SSS s/p [employer] DDD PPM 2014, s/p AV nodal ablation 12/2019, not on [place] due to recurrent GI bleeding, mild/mod MR, and chronic dyspnea who is being seen today for the evaluation of Pacemaker End of Service at the request of Dr. Tyrell Antonio.  HPI:  Evelyn Simmons is a 88 y.o. female with medical history as above.   Pt presented for altered MS and hypotension, felt to have possible sepsis and UTI.   UCx negative, BCx. Slightly HypOthermic but not fever.  Lactate normal UA nitrite negative moderate leukocytes BNP 465 BUN 55 Cr 1.7 Hct 26.3 WBC 8.5 Troponin 504   Pt was brought to our attention by [employer] rep who had interrogated her device earlier in the admission and noted that she has met EOS.   She remains on Cefdinir for one additional dose.   She is slumped over in her chair when we initially enter, but quickly perks up and participates in the conversation, though doesn't remember Dr. Caryl Comes despite many visits over the years.    Past Medical History:  Diagnosis Date   (HFmrEF) heart failure with mid-range ejection fraction ([place])    a. 09/2019 Echo: EF 45-50%, glob HK, mild conc LVH, mildly elev PASP. Sev dil LA. Mildly dil RA. Mild MR.   Anemia    Arthritis    Asthma    CAD (coronary artery disease)    a. 1995 s/p PCI/BMS LAD; b. 2011 s/p PCI/DES RCA.   CHF (congestive heart failure) (HCC)    Chronic kidney disease    COPD (chronic obstructive pulmonary disease) (HCC)    Depression    Diabetes mellitus without complication (HCC)    Diverticulitis    Dysrhythmia    GERD (gastroesophageal  reflux disease)    Hyperlipidemia    Hypertension    Hypothyroid    pt unsure, not currently on medication   Ischemic cardiomyopathy    MI (myocardial infarction) ([place])    1995   Persistent atrial fibrillation ([place])    a. No OAC 2/2 h/o bleeding/anemia; b. Longterm amio therapy; c. 08/2019 - recurrent afib-->rate-controlled.   Presence of permanent cardiac pacemaker    Radiculopathy      Surgical History:  Past Surgical History:  Procedure Laterality Date   ABDOMINAL HYSTERECTOMY     APPENDECTOMY     AV NODE ABLATION N/A 12/13/2019   Procedure: AV NODE ABLATION;  Surgeon: Deboraha Sprang, MD;  Location: [facility];  Service: Cardiovascular;  Laterality: N/A;   CARDIAC CATHETERIZATION     COLONOSCOPY WITH PROPOFOL N/A 10/21/2019   Procedure: COLONOSCOPY WITH PROPOFOL;  Surgeon: Lin Landsman, MD;  Location: [facility] ENDOSCOPY;  Service: Gastroenterology;  Laterality: N/A;   CORONARY ANGIOPLASTY WITH STENT PLACEMENT  12/11/1993   RCA   ESOPHAGOGASTRODUODENOSCOPY (EGD) WITH PROPOFOL N/A 10/21/2019   Procedure: ESOPHAGOGASTRODUODENOSCOPY (EGD) WITH PROPOFOL;  Surgeon: Lin Landsman, MD;  Location: [facility];  Service: Gastroenterology;  Laterality: N/A;   FOOT SURGERY Bilateral    Over 25 years ago- Bunionectomy   GIVENS CAPSULE STUDY N/A 10/22/2019   Procedure: GIVENS CAPSULE STUDY;  Surgeon: Lin Landsman, MD;  Location:  [facility] ENDOSCOPY;  Service: Gastroenterology;  Laterality: N/A;   INSERT / REPLACE / REMOVE PACEMAKER  09/2012   Model # X448  serial # [id number]   JOINT REPLACEMENT     bilateral knee replacements     Medications Prior to Admission  Medication Sig Dispense Refill Last Dose   acetaminophen (TYLENOL) 650 MG CR tablet SMARTSIG:1 Tablet(s) By Mouth Twice Daily   10/11/2021   antiseptic oral rinse (BIOTENE) LIQD 10 mLs by Mouth Rinse route 2 (two) times daily as needed for dry mouth.   Past Week   aspirin EC 81 MG tablet Take 81 mg by mouth daily.    10/11/2021   ATIVAN 0.5 MG tablet Take 0.5 mg by mouth daily.   10/11/2021   cholecalciferol (VITAMIN D) 1000 units tablet Take 1,000 Units by mouth daily.   10/11/2021   CIPRO 500 MG tablet Take 500 mg by mouth 2 (two) times daily.   10/11/2021   diclofenac Sodium (VOLTAREN) 1 % GEL Apply 1 application topically 4 (four) times daily as needed (left knee pain.).    Past Month   DULoxetine (CYMBALTA) 30 MG capsule Take 30 mg by mouth daily.   10/11/2021   famotidine (PEPCID) 20 MG tablet Take 20 mg by mouth daily.   10/11/2021   FEROSUL 325 (65 Fe) MG tablet Take 325 mg by mouth daily with breakfast.   10/11/2021   furosemide (LASIX) 20 MG tablet Take 20 mg by mouth daily as needed for fluid.   10/11/2021   lidocaine (LIDODERM) 5 % Place 1 patch onto the skin daily as needed (knee pain.). Remove & Discard patch within 12 hours or as directed by MD   Past Week   loperamide (IMODIUM) 2 MG capsule Take 2 mg by mouth daily.   10/11/2021   loratadine (CLARITIN) 10 MG tablet Take 10 mg by mouth daily.   10/11/2021   MACROBID 100 MG capsule Take 100 mg by mouth 2 (two) times daily.   10/11/2021   metolazone (ZAROXOLYN) 2.5 MG tablet 2.5 mg every Monday, Wednesday, and Friday.   Past Week   Multiple Vitamins-Minerals (PRESERVISION AREDS 2+MULTI VIT) CAPS Take 1 capsule by mouth 2 (two) times daily.   10/11/2021   naloxone (NARCAN) nasal spray 4 mg/0.1 mL Place 1 spray into the nose See admin instructions. Administer 1 spray in one nostril. Repeat every 3 minutes if no response.   unk   nitroGLYCERIN (NITROSTAT) 0.4 MG SL tablet Place 0.4 mg under the tongue every 5 (five) minutes x 3 doses as needed for chest pain.    unk   nystatin-triamcinolone (MYCOLOG II) cream Apply 1 application topically 3 (three) times daily as needed (affected area(s)).   Past Month   pantoprazole (PROTONIX) 20 MG tablet Take 20 mg by mouth daily.   10/11/2021   potassium chloride (KLOR-CON) 20 MEQ packet Take 20 mEq by mouth daily.    10/11/2021   QUEtiapine (SEROQUEL) 25 MG tablet Take 25 mg by mouth at bedtime.    10/11/2021   rosuvastatin (CRESTOR) 40 MG tablet Take 1 tablet (40 mg total) by mouth daily. 90 tablet 3 10/11/2021   TAMIFLU 30 MG capsule Take 30 mg by mouth every other day.   unk   torsemide (DEMADEX) 20 MG tablet Take 2 tablets (40 mg) by mouth once daily   10/11/2021   traMADol (ULTRAM) 50 MG tablet Take 50 mg by mouth every 6 (six) hours as needed for pain.   Past Month  traZODone (DESYREL) 100 MG tablet Take 100 mg by mouth at bedtime.   10/11/2021   TUSSIN COUGH+CHEST CONGEST DM 10-100 MG/5ML liquid Take 10 mLs by mouth at bedtime as needed for cough.   unk   TYLENOL 325 MG tablet Take 325 mg by mouth daily.   10/11/2021   vitamin B-12 1000 MCG tablet Take 1 tablet (1,000 mcg total) by mouth daily. 30 tablet 1 10/11/2021   Blood Glucose Monitoring Suppl (ACCU-CHEK NANO SMARTVIEW) W/DEVICE KIT Please issue nano meter by AccuChek along with test strips at checking frequency of 2x daily. 90 day supply. E11.9 1 kit 1    glucose blood (FREESTYLE LITE) test strip Use as instructed 200 each 3    Lancets (FREESTYLE) lancets Use as instructed 200 each 3    polyethylene glycol (MIRALAX / GLYCOLAX) 17 g packet Take 17 g by mouth daily as needed (constipation.).  (Patient not taking: Reported on 10/12/2021)   Not Taking    Inpatient Medications:   aspirin EC  81 mg Oral Daily   cefdinir  300 mg Oral Daily   DULoxetine  30 mg Oral Daily   insulin aspart  0-9 Units Subcutaneous TID WC   pantoprazole  20 mg Oral Daily   polyvinyl alcohol  1 drop Both Eyes TID   rosuvastatin  40 mg Oral Daily    Allergies:  Allergies  Allergen Reactions   Penicillins Nausea And Vomiting, Rash and Hives   Other Other (See Comments)   Sulfa Antibiotics     Pt unsure reaction, was told in hospital she had allergy   Penicillin G Rash    Social History   Socioeconomic History   Marital status: Widowed    Spouse name: Not on file    Number of children: Not on file   Years of education: Not on file   Highest education level: Not on file  Occupational History   Not on file  Tobacco Use   Smoking status: Former    Types: Cigarettes    Quit date: 12/10/1993    Years since quitting: 27.8   Smokeless tobacco: Former  Scientific laboratory technician Use: Never used  Substance and Sexual Activity   Alcohol use: Yes    Alcohol/week: 0.0 standard drinks of alcohol    Comment: Rare   Drug use: No   Sexual activity: Not Currently  Other Topics Concern   Not on file  Social History Narrative   Moved from [place] with eldest daughter   7 children, 11 grandchildren, 1 GG children    Social Determinants of Radio broadcast assistant Strain: Not on file  Food Insecurity: Not on file  Transportation Needs: Not on file  Physical Activity: Not on file  Stress: Not on file  Social Connections: Not on file  Intimate Partner Violence: Not on file     Family History  Problem Relation Age of Onset   Arthritis Mother    Heart disease Mother    Hypertension Mother    Diabetes Mother    Arthritis Father    Heart disease Father    Hypertension Father    Cancer Sister        breast cancer   Diabetes Brother    Cancer Daughter        lung cancer   Diabetes Sister      Review of Systems: All other systems reviewed and are otherwise negative except as noted above.  Physical Exam: Vitals:  10/16/21 1942 10/16/21 2000 10/16/21 2340 10/17/21 0456  BP: (!) 127/50 (!) 124/45 (!) 130/52 (!) 122/51  Pulse: (!) 50 (!) 50 (!) 50 (!) 49  Resp: '20  20 20  ' Temp: 98.2 F (36.8 C)  (!) 97.4 F (36.3 C) 98 F (36.7 C)  TempSrc: Oral  Oral Oral  SpO2: 98% 96% 95% 96%  Weight:    67.3 kg  Height:        GEN- The patient is elderly appearing, alert and oriented x 3 today.   HEENT: normocephalic, atraumatic; sclera clear, conjunctiva pink; hearing intact; oropharynx clear; neck supple Lungs- Clear to ausculation  bilaterally, normal work of breathing.  No wheezes, rales, rhonchi Heart- Regular rate and rhythm, no murmurs, rubs or gallops GI- soft, non-tender, non-distended, bowel sounds present Extremities- no clubbing, cyanosis, or edema; DP/PT/radial pulses 2+ bilaterally MS- no significant deformity or atrophy Skin- warm and dry, no rash or lesion Psych- euthymic mood, full affect Neuro- strength and sensation are intact  Labs:   Lab Results  Component Value Date   WBC 6.8 10/16/2021   HGB 8.6 (L) 10/16/2021   HCT 26.6 (L) 10/16/2021   MCV 97.1 10/16/2021   PLT 296 10/16/2021    Recent Labs  Lab 10/16/21 0235  NA 139  K 4.0  CL 102  CO2 28  BUN 37*  CREATININE 1.49*  CALCIUM 10.5*  GLUCOSE 97      Radiology/Studies: DG Chest 2 View  Result Date: 10/15/2021 CLINICAL DATA:  Hypoxia EXAM: CHEST - 2 VIEW COMPARISON:  Previous studies including the examination of 10/11/2021 FINDINGS: Transverse diameter of heart is increased. Central pulmonary vessels are more prominent. Increased interstitial markings are seen in the parahilar regions. There is blunting of right lateral CP angle. There is no pneumothorax. Pacemaker battery is seen in the left infraclavicular region. IMPRESSION: Cardiomegaly. Central pulmonary vessels are more prominent suggesting CHF. Small right pleural effusion. Electronically Signed   By: Elmer Picker M.D.   On: 10/15/2021 16:00   ECHOCARDIOGRAM COMPLETE  Result Date: 10/12/2021    ECHOCARDIOGRAM REPORT   Patient Name:   Evelyn Simmons Date of Exam: 10/12/2021 Medical Rec #:  [id number]     Height:       59.0 in Accession #:    [id number]    Weight:       147.7 lb Date of Birth:  07-Mar-1929      BSA:          1.621 m Patient Age:    57 years      BP:           116/44 mmHg Patient Gender: F             HR:           58 bpm. Exam Location:  Inpatient Procedure: 2D Echo, Cardiac Doppler, Color Doppler and Intracardiac            Opacification Agent                                 MODIFIED REPORT:  This report was modified by Cherlynn Kaiser MD on 10/12/2021 due to report send                                     to EMR.  Indications:  Cardiomyopathy  History:         Patient has prior history of Echocardiogram examinations, most                  recent 12/12/2020. CHF, CAD, Pacemaker, Arrythmias:Atrial                  Fibrillation; Risk Factors:Diabetes, Dyslipidemia and Former                  Smoker.  Sonographer:     Clayton Lefort RDCS (AE) Referring Phys:  [id number] Lowella Dell REESE Diagnosing Phys: Cherlynn Kaiser MD  Sonographer Comments: Technically difficult study due to poor echo windows and no subcostal window obtained. IMPRESSIONS  1. The mitral valve is degenerative. Severe mitral valve regurgitation, posteriorly directed with posterior leaflet restriction and anterior leaflet override. No evidence of mitral stenosis. Severe mitral annular calcification.  2. Left ventricular ejection fraction, by estimation, is 45 to 50%. The left ventricle has mildly decreased function. The left ventricle demonstrates regional wall motion abnormalities (see scoring diagram/findings for description). Left ventricular diastolic parameters are indeterminate.  3. Right ventricular systolic function is mildly reduced. The right ventricular size is moderately enlarged.  4. Left atrial size was severely dilated.  5. The aortic valve is abnormal. There is severe calcifcation of the aortic valve. Aortic valve regurgitation is trivial. Mild aortic valve stenosis. Aortic valve mean gradient measures 8.0 mmHg. FINDINGS  Left Ventricle: Left ventricular ejection fraction, by estimation, is 45 to 50%. The left ventricle has mildly decreased function. The left ventricle demonstrates regional wall motion abnormalities. Definity contrast agent was given IV to delineate the left ventricular endocardial borders. The left ventricular internal cavity size was normal in size. There is no left ventricular  hypertrophy. Left ventricular diastolic parameters are indeterminate.  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 The basal anteroseptal segment and basal inferoseptal segment are akinetic. Right Ventricle: The right ventricular size is moderately enlarged. No increase in right ventricular wall thickness. Right ventricular systolic function is mildly reduced. Left Atrium: Left atrial size was severely dilated. Right Atrium: Right atrial size was normal in size. Pericardium: There is no evidence of pericardial effusion. Mitral Valve: The mitral valve is degenerative in appearance. Severe mitral annular calcification. Severe mitral valve regurgitation. No evidence of mitral valve stenosis. The mean mitral valve gradient is 2.7 mmHg. Tricuspid Valve: The tricuspid valve is normal in structure. Tricuspid valve regurgitation is not demonstrated. No evidence of tricuspid stenosis. Aortic Valve: The aortic valve is abnormal. There is severe calcifcation of the aortic valve. Aortic valve regurgitation is trivial. Mild aortic stenosis is present. Aortic valve mean gradient measures 8.0 mmHg. Aortic valve peak gradient measures 15.7 mmHg. Aortic valve area, by VTI measures 0.92 cm. Pulmonic Valve: The pulmonic valve was normal in structure. Pulmonic valve regurgitation is trivial. No evidence of pulmonic stenosis. Aorta: The aortic root is normal in size and structure. Venous: The inferior vena cava was not well visualized. IAS/Shunts: The interatrial septum was not assessed.  LEFT VENTRICLE PLAX 2D LVIDd:         4.70 cm LVIDs:         3.40 cm LV PW:         1.20 cm LV IVS:        0.84 cm LVOT diam:     1.90 cm LV SV:         40 LV SV Index:   25 LVOT Area:  2.84 cm  RIGHT VENTRICLE RV Basal diam:  4.20 cm RV Mid diam:    3.60 cm RV S prime:     7.62 cm/s TAPSE (M-mode): 2.0 cm LEFT ATRIUM              Index        RIGHT ATRIUM           Index LA diam:        5.70 cm  3.52 cm/m   RA Area:     20.70 cm  LA Vol (A2C):   161.0 ml 99.29 ml/m  RA Volume:   51.00 ml  31.45 ml/m LA Vol (A4C):   144.0 ml 88.81 ml/m LA Biplane Vol: 156.0 ml 96.21 ml/m  AORTIC VALVE AV Area (Vmax):    1.01 cm AV Area (Vmean):   0.92 cm AV Area (VTI):     0.92 cm AV Vmax:           198.00 cm/s AV Vmean:          130.000 cm/s AV VTI:            0.435 m AV Peak Grad:      15.7 mmHg AV Mean Grad:      8.0 mmHg LVOT Vmax:         70.30 cm/s LVOT Vmean:        42.000 cm/s LVOT VTI:          0.141 m LVOT/AV VTI ratio: 0.32  AORTA Ao Root diam: 2.90 cm Ao Asc diam:  3.70 cm MITRAL VALVE MV Mean grad: 2.7 mmHg SHUNTS                        Systemic VTI:  0.14 m                        Systemic Diam: 1.90 cm Cherlynn Kaiser MD Electronically signed by Cherlynn Kaiser MD Signature Date/Time: 10/12/2021/5:35:26 PM    Final (Updated)    CT Head Wo Contrast  Result Date: 10/11/2021 CLINICAL DATA:  Fall, head and neck trauma. EXAM: CT HEAD WITHOUT CONTRAST CT CERVICAL SPINE WITHOUT CONTRAST TECHNIQUE: Multidetector CT imaging of the head and cervical spine was performed following the standard protocol without intravenous contrast. Multiplanar CT image reconstructions of the cervical spine were also generated. RADIATION DOSE REDUCTION: This exam was performed according to the departmental dose-optimization program which includes automated exposure control, adjustment of the mA and/or kV according to patient size and/or use of iterative reconstruction technique. COMPARISON:  04/26/2021. FINDINGS: CT HEAD FINDINGS Brain: No acute intracranial hemorrhage, midline shift or mass effect. No extra-axial fluid collection. Diffuse atrophy is noted. Subcortical and periventricular white matter hypodensities are present bilaterally. No hydrocephalus. Vascular: Atherosclerotic calcification of the carotid siphons and vertebral arteries. No hyperdense vessel. Skull: No acute fracture. Sinuses/Orbits: No acute finding. Other: None. CT CERVICAL SPINE FINDINGS  Alignment: Mild anterolisthesis at C5-C6 and C7-T1. Skull base and vertebrae: No acute fracture. Soft tissues and spinal canal: No prevertebral fluid or swelling. No visible canal hematoma. Disc levels: Multilevel intervertebral disc space narrowing, uncovertebral osteophyte formation, and facet arthropathy. Upper chest: Emphysematous changes in the lungs. Mild dependent atelectasis bilaterally. Aortic atherosclerosis. Other: Carotid artery calcifications. IMPRESSION: 1. No acute intracranial process. 2. Atrophy with chronic microvascular ischemic changes. 3. Multilevel degenerative changes in the cervical spine without evidence of acute fracture. Electronically Signed   By: Brett Fairy  M.D.   On: 10/11/2021 22:02   CT Cervical Spine Wo Contrast  Result Date: 10/11/2021 CLINICAL DATA:  Fall, head and neck trauma. EXAM: CT HEAD WITHOUT CONTRAST CT CERVICAL SPINE WITHOUT CONTRAST TECHNIQUE: Multidetector CT imaging of the head and cervical spine was performed following the standard protocol without intravenous contrast. Multiplanar CT image reconstructions of the cervical spine were also generated. RADIATION DOSE REDUCTION: This exam was performed according to the departmental dose-optimization program which includes automated exposure control, adjustment of the mA and/or kV according to patient size and/or use of iterative reconstruction technique. COMPARISON:  04/26/2021. FINDINGS: CT HEAD FINDINGS Brain: No acute intracranial hemorrhage, midline shift or mass effect. No extra-axial fluid collection. Diffuse atrophy is noted. Subcortical and periventricular white matter hypodensities are present bilaterally. No hydrocephalus. Vascular: Atherosclerotic calcification of the carotid siphons and vertebral arteries. No hyperdense vessel. Skull: No acute fracture. Sinuses/Orbits: No acute finding. Other: None. CT CERVICAL SPINE FINDINGS Alignment: Mild anterolisthesis at C5-C6 and C7-T1. Skull base and vertebrae: No  acute fracture. Soft tissues and spinal canal: No prevertebral fluid or swelling. No visible canal hematoma. Disc levels: Multilevel intervertebral disc space narrowing, uncovertebral osteophyte formation, and facet arthropathy. Upper chest: Emphysematous changes in the lungs. Mild dependent atelectasis bilaterally. Aortic atherosclerosis. Other: Carotid artery calcifications. IMPRESSION: 1. No acute intracranial process. 2. Atrophy with chronic microvascular ischemic changes. 3. Multilevel degenerative changes in the cervical spine without evidence of acute fracture. Electronically Signed   By: Brett Fairy M.D.   On: 10/11/2021 22:02   DG Chest Portable 1 View  Result Date: 10/11/2021 CLINICAL DATA:  Fall trauma EXAM: PORTABLE CHEST 1 VIEW COMPARISON:  04/26/2021 FINDINGS: Left-sided pacing device as before. Cardiomegaly with central vascular congestion. No acute airspace disease, pleural effusion or pneumothorax. Aortic atherosclerosis. IMPRESSION: Cardiomegaly with central vascular congestion. Electronically Signed   By: Donavan Foil M.D.   On: 10/11/2021 21:45    EKG: V paced at 50 (underlying AF) (personally reviewed)  TELEMETRY: V paced at 50 (underlying AF)  (personally reviewed)  DEVICE HISTORY: [employer] DDD PPM 2014  Assessment/Plan: 1.  SSS s/p [employer] DDD PPM 2014 Device is at end of service and will require generator change prior to discharge.  We discussed with patient who is agreeable to generator change today.  Explained risks, benefits, and alternatives to generator change, including but not limited to bleeding and infection. Pt verbalized understanding and agrees to proceed   2. Permananent AF 3. S/p AV nodal ablation Not on [place] with recurrent GI bleeding.   4. UTI 5. Sepsis Improved. Pending completion of ABx tomorrow.   Dr. Caryl Comes has seen.   For questions or updates, please contact [place] Please consult [email or web address] for contact info under  Cardiology/STEMI.  Signed, Shirley Friar, PA-C  10/17/2021 9:09 AM   Pacemaker [employer] at [employer]  AF permanent  Complete heart block s/p AV ablation  UTI/sepsis syndrome with hypotension Rx antibiotics, now improved  CAD s/p stenting  GI bleeding -- thought NOT candidate for anticoagulation   There has been relatively rapid depletion of the battery over the last year and now at EOS with unpredictable longevity.  Hence, as she is no longer showing signs of systemic infection, will proceed with device generator replacement.    She does not remember me, but is aware of her hospitalization date, and made a joke congruent with our conversations and has made decisions in the last days about SNF

## 2021-10-17 NOTE — Discharge Instructions (Signed)

## 2021-10-17 NOTE — Progress Notes (Signed)
Occupational Therapy Treatment Patient Details Name: Evelyn Simmons MRN: [id number] DOB: August 26, 1929 Today's Date: 10/17/2021   History of present illness Evelyn Simmons is an 39yoF who presents on 8/31 when [facility] called EMS for pt down on kitchen floor.  Pt hypotensive and with AMS.  PMH: COPD,  combined CHF, AF, PPM, HTN, hypoTSH. Pt is a PACE participant, mostly a household ambulator with tripod rollator.   OT comments  Patient received in supine and agreeable to OT session. Patient able to get to EOB without assistance and ambulated to sink. Patient stood at sink for grooming and bathing tasks with one seated rest break. Patient ambulated to recliner and was provided with energy conservation strategies handout and reviewed with patient. Acute OT to continue to follow.    Recommendations for follow up therapy are one component of a multi-disciplinary discharge planning process, led by the attending physician.  Recommendations may be updated based on patient status, additional functional criteria and insurance authorization.    Follow Up Recommendations  Home health OT    Assistance Recommended at Discharge Frequent or constant Supervision/Assistance  Patient can return home with the following  A little help with walking and/or transfers;A little help with bathing/dressing/bathroom;Assistance with cooking/housework;Direct supervision/assist for financial management;Assist for transportation;Help with stairs or ramp for entrance   Equipment Recommendations  None recommended by OT    Recommendations for Other Services      Precautions / Restrictions Precautions Precautions: Fall Restrictions Weight Bearing Restrictions: No       Mobility Bed Mobility Overal bed mobility: Needs Assistance Bed Mobility: Supine to Sit     Supine to sit: Supervision     General bed mobility comments: increased time and effort but no physical assist needed    Transfers Overall transfer level:  Needs assistance Equipment used: Rolling walker (2 wheels) Transfers: Sit to/from Stand Sit to Stand: Min assist           General transfer comment: min guard when up with verbal cues for hand placement     Balance Overall balance assessment: Needs assistance Sitting-balance support: No upper extremity supported, Feet supported Sitting balance-Leahy Scale: Good Sitting balance - Comments: able to sit on EOB without assistance   Standing balance support: Single extremity supported Standing balance-Leahy Scale: Poor Standing balance comment: reliant on RW or sink for support                           ADL either performed or assessed with clinical judgement   ADL Overall ADL's : Needs assistance/impaired     Grooming: Wash/dry hands;Wash/dry face;Oral care;Brushing hair;Min guard;Standing Grooming Details (indicate cue type and reason): required one seated rest break before completing Upper Body Bathing: Min guard;Standing Upper Body Bathing Details (indicate cue type and reason): standing at sink                           General ADL Comments: able to stand at sink for self care tasks with one seated rest break    Extremity/Trunk Assessment              Vision       Perception     Praxis      Cognition Arousal/Alertness: Awake/alert Behavior During Therapy: WFL for tasks assessed/performed Overall Cognitive Status: Impaired/Different from baseline Area of Impairment: Memory  Memory: Decreased short-term memory         General Comments: unable to recall energy conservation strategies after review        Exercises      Shoulder Instructions       General Comments provided handout for energy conservation and reviewed    Pertinent Vitals/ Pain       Pain Assessment Pain Assessment: Faces Faces Pain Scale: Hurts a little bit Pain Location: headache Pain Descriptors / Indicators: Headache Pain  Intervention(s): Monitored during session  Home Living                                          Prior Functioning/Environment              Frequency  Min 2X/week        Progress Toward Goals  OT Goals(current goals can now be found in the care plan section)  Progress towards OT goals: Progressing toward goals  Acute Rehab OT Goals OT Goal Formulation: With patient Time For Goal Achievement: 10/29/21 Potential to Achieve Goals: Good ADL Goals Pt Will Perform Grooming: with supervision;standing Pt Will Perform Lower Body Bathing: with supervision;sit to/from stand Pt Will Perform Lower Body Dressing: with supervision;sit to/from stand Pt Will Transfer to Toilet: with supervision;ambulating Pt Will Perform Toileting - Clothing Manipulation and hygiene: with supervision;sit to/from stand Additional ADL Goal #1: Pt will state at least 3 energy conservation strategies as instructed.  Plan Discharge plan remains appropriate    Co-evaluation                 AM-PAC OT "6 Clicks" Daily Activity     Outcome Measure   Help from another person eating meals?: None Help from another person taking care of personal grooming?: A Little Help from another person toileting, which includes using toliet, bedpan, or urinal?: A Little Help from another person bathing (including washing, rinsing, drying)?: A Little Help from another person to put on and taking off regular upper body clothing?: None Help from another person to put on and taking off regular lower body clothing?: A Little 6 Click Score: 20    End of Session Equipment Utilized During Treatment: Gait belt;Rolling walker (2 wheels);Oxygen  OT Visit Diagnosis: Unsteadiness on feet (R26.81);Other abnormalities of gait and mobility (R26.89);Muscle weakness (generalized) (M62.81);Other symptoms and signs involving cognitive function   Activity Tolerance Patient tolerated treatment well   Patient Left in  chair;with call bell/phone within reach;with chair alarm set   Nurse Communication Mobility status        Time: 5974-7185 OT Time Calculation (min): 24 min  Charges: OT General Charges $OT Visit: 1 Visit OT Treatments $Self Care/Home Management : 23-37 mins  Lodema Hong, [place]  Office [phone]   Trixie Dredge 10/17/2021, 8:19 AM

## 2021-10-17 NOTE — Progress Notes (Signed)
PROGRESS NOTE    Evelyn Simmons  VHQ:[id number] DOB: [birth year] DOA: 10/11/2021 PCP: Dionicia Abler, NP (Inactive)   Brief Narrative: 88 year old with past medical history hypertension, hyperlipidemia, CAD TEE S 2011, MI, CHF/ICM ejection fraction 45% echo 2022, PAF , CHB status post pacemaker, COPD, diabetes type 2, CKD, hypothyroidism who presented to the ED after a fall at home EMS notified by [facility] found down on the kitchen floor,  initially incoherent became more lucid around, noted to be bradycardic heart rate in the 40s also noted to have emesis x3.  Patient noted to have had a few day history of decreased oral intake, recently diagnosed with UTI and prescribed Macrobid.  She was found to be hypotensive in the ED with a MAP of 50, received IV fluids with no improvement.  She was started on IV pressors.  UTI was concerning for UTI.  Patient was placed on IV antibiotics.  Cardiology consulted who assessed patient, reviewed 2D echo and felt nothing was added from cardiology standpoint. EP was consulted, it was brought to their attention by [employer] rep who had interrogated patient's device earlier in the admission noted that she has met EOS. PT recommended generator change prior to discharge.  Plan to proceed today.   Assessment & Plan:   Principal Problem:   Hypotension Active Problems:   Acute on chronic systolic CHF (congestive heart failure) (HCC)   Septic shock (HCC)   Acute cystitis without hematuria   Syncope and collapse  Septic shock likely secondary to UTI, POA: Patient admitted and treated for sepsis with septic shock requiring IV fluids and pressor support. Urinalysis was done and concerning with UTI. Urine culture no growth today. He has been weaned off of IV pressors. For chronotropic incompetency as well as persistent hypotension cardiology consulted, no future recommendation. She will complete Omnicef total 5 7 days of treatment  Permanent A-fib, complete heart  block status post AV ablation SSS status post [facility]  DDD PPM 2014. Ozzie Hoyle of CAD status post DES/history of MI/CHF/ICM/elevated troponin Patient needs generator change, EP planning to exchange today. Elevated troponin felt likely secondary to demand ischemia.  PCCM 2D echo ejection fraction 45 to 50%, mild reduced right ventricular systolic function.severely dilated left atrial size, severe MVR, mild aortic valve stenosis. Patient seen in consultation by cardiology with nothing further to add at this time, recommended goals of care. EP planning generator change today   Acute on chronic systolic heart failure Patient was noted to have new hypoxia. He was complaining of shortness of breath. Chest  x-ray done 9/4 concerning for volume overload, BNP 728 She has received IV Lasix this 20 mg IV x1 on 9/4 and 20 mg IV every 12 hours x2 doses 9/5. Good urine output yesterday 4.8 L.  She has been n.p.o. awaiting for generator exchange Holding  IV Lasix today due to avoid further hypotension and increase cr.   AKI on CKD stage IIIb: Plan to hold for the Lasix today Urine for UTI  COPD/hypoxia Required 2 L of oxygen at night per PACE.  Received IV lasix.  Wean off oxygen as tolerated.   Diabetes type 2: Continue with a sliding scale insulin  GERD: Continue with PPI  Questionable hypothyroidism;  ruled out Synthroid not noted on MAR. Dr. Grandville Silos spoke with PCP and patient was not on thyroid replacement therapy.  TSH within normal limits.   Pressure Injury 10/12/21 Buttocks Bilateral Deep Tissue Pressure Injury - Purple or maroon localized area of discolored  intact skin or blood-filled blister due to damage of underlying soft tissue from pressure and/or shear. dark purple areas bilateral bu (Active)  10/12/21 1930  Location: Buttocks  Location Orientation: Bilateral  Staging: Deep Tissue Pressure Injury - Purple or maroon localized area of discolored intact skin or blood-filled  blister due to damage of underlying soft tissue from pressure and/or shear.  Wound Description (Comments): dark purple areas bilateral buttocks  Present on Admission: Yes  Dressing Type Foam - Lift dressing to assess site every shift 10/16/21 2100     Estimated body mass index is 29.95 kg/m as calculated from the following:   Height as of this encounter: '4\' 11"'  (1.499 m).   Weight as of this encounter: 67.3 kg.   DVT prophylaxis: Heparin  Code Status: Full code Family Communication: no family at bedside.  Disposition Plan:  Status is: Inpatient Remains inpatient appropriate because: needs rehab.     Consultants:  Cardiology   Procedures:  CT head CT C-spine 10/11/2021 Chest x-ray 10/11/2021 2D echo 10/12/2021  Antimicrobials:  IV Rocephin 10/12/2021>>>>> 10/16/2021 Omnicef 10/16/2021>>>>  Subjective: She denies dyspnea, denies chest pain.   Objective: Vitals:   10/16/21 1942 10/16/21 2000 10/16/21 2340 10/17/21 0456  BP: (!) 127/50 (!) 124/45 (!) 130/52 (!) 122/51  Pulse: (!) 50 (!) 50 (!) 50 (!) 49  Resp: '20  20 20  ' Temp: 98.2 F (36.8 C)  (!) 97.4 F (36.3 C) 98 F (36.7 C)  TempSrc: Oral  Oral Oral  SpO2: 98% 96% 95% 96%  Weight:    67.3 kg  Height:        Intake/Output Summary (Last 24 hours) at 10/17/2021 0816 Last data filed at 10/17/2021 0720 Gross per 24 hour  Intake 365 ml  Output 4250 ml  Net -3885 ml   Filed Weights   10/15/21 0500 10/16/21 0248 10/17/21 0456  Weight: 68.9 kg 70.7 kg 67.3 kg    Examination:  General exam: Appears calm and comfortable  Respiratory system: Clear to auscultation. Respiratory effort normal. Cardiovascular system: S1 & S2 heard, RRR.  Gastrointestinal system: Abdomen is nondistended, soft and nontender. No organomegaly or masses felt. Normal bowel sounds heard. Central nervous system: Alert and oriented.  Extremities: Symmetric 5 x 5 power.  Data Reviewed: I have personally reviewed following labs and imaging  studies  CBC: Recent Labs  Lab 10/11/21 2133 10/12/21 0627 10/15/21 0226 10/16/21 0235  WBC 10.9* 8.5 6.3 6.8  NEUTROABS 9.0*  --   --  4.2  HGB 9.8* 8.6* 8.4* 8.6*  HCT 31.1* 26.3* 26.5* 26.6*  MCV 100.6* 97.4 100.0 97.1  PLT 267 293 246 828   Basic Metabolic Panel: Recent Labs  Lab 10/12/21 0045 10/12/21 0627 10/12/21 1610 10/13/21 0026 10/14/21 0937 10/15/21 0226 10/16/21 0235  NA  --  140 137 138 135 139 139  K  --  3.0* 4.4 4.0 4.2 4.1 4.0  CL  --  103 106 104 103 105 102  CO2  --  '26 22 25 25 25 28  ' GLUCOSE  --  142* 109* 218* 184* 129* 97  BUN  --  55* 50* 49* 47* 48* 37*  CREATININE  --  1.70* 1.55* 1.65* 1.81* 1.86* 1.49*  CALCIUM  --  9.6 9.2 9.7 9.9 10.0 10.5*  MG 1.1* 3.5*  --   --  2.4  --   --   PHOS  --  3.6  --   --   --   --   --  GFR: Estimated Creatinine Clearance: 20.9 mL/min (A) (by C-G formula based on SCr of 1.49 mg/dL (H)). Liver Function Tests: No results for input(s): "AST", "ALT", "ALKPHOS", "BILITOT", "PROT", "ALBUMIN" in the last 168 hours. No results for input(s): "LIPASE", "AMYLASE" in the last 168 hours. No results for input(s): "AMMONIA" in the last 168 hours. Coagulation Profile: Recent Labs  Lab 10/11/21 2133  INR 1.2   Cardiac Enzymes: No results for input(s): "CKTOTAL", "CKMB", "CKMBINDEX", "TROPONINI" in the last 168 hours. BNP (last 3 results) No results for input(s): "PROBNP" in the last 8760 hours. HbA1C: No results for input(s): "HGBA1C" in the last 72 hours. CBG: Recent Labs  Lab 10/16/21 0648 10/16/21 1117 10/16/21 1600 10/16/21 2053 10/17/21 0604  GLUCAP 95 136* 117* 176* 94   Lipid Profile: No results for input(s): "CHOL", "HDL", "LDLCALC", "TRIG", "CHOLHDL", "LDLDIRECT" in the last 72 hours. Thyroid Function Tests: Recent Labs    10/15/21 0226  TSH 0.461   Anemia Panel: No results for input(s): "VITAMINB12", "FOLATE", "FERRITIN", "TIBC", "IRON", "RETICCTPCT" in the last 72 hours. Sepsis  Labs: Recent Labs  Lab 10/12/21 0045 10/12/21 0627  PROCALCITON  --  2.53  LATICACIDVEN 1.5 1.1    Recent Results (from the past 240 hour(s))  MRSA Next Gen by PCR, Nasal     Status: None   Collection Time: 10/12/21  2:41 AM   Specimen: Nasal Mucosa; Nasal Swab  Result Value Ref Range Status   MRSA by PCR Next Gen NOT DETECTED NOT DETECTED Final    Comment: (NOTE) The GeneXpert MRSA Assay (FDA approved for NASAL specimens only), is one component of a comprehensive MRSA colonization surveillance program. It is not intended to diagnose MRSA infection nor to guide or monitor treatment for MRSA infections. Test performance is not FDA approved in patients less than 23 years old. Performed at [facility], [place] [address], [place], [place] [address]   Urine Culture     Status: None   Collection Time: 10/12/21 11:19 AM   Specimen: Urine, Clean Catch  Result Value Ref Range Status   Specimen Description URINE, CLEAN CATCH  Final   Special Requests NONE  Final   Culture   Final    NO GROWTH Performed at [facility], [place] [address] [place], [place] [address]    Report Status 10/13/2021 FINAL  Final  Culture, blood (Routine X 2) w Reflex to ID Panel     Status: None   Collection Time: 10/12/21 11:50 AM   Specimen: BLOOD  Result Value Ref Range Status   Specimen Description BLOOD RIGHT ANTECUBITAL  Final   Special Requests   Final    BOTTLES DRAWN AEROBIC AND ANAEROBIC Blood Culture adequate volume   Culture   Final    NO GROWTH 5 DAYS Performed at [facility], [place] [address], [place], [place] [address]    Report Status 10/17/2021 FINAL  Final  Culture, blood (Routine X 2) w Reflex to ID Panel     Status: None   Collection Time: 10/12/21 12:05 PM   Specimen: BLOOD RIGHT HAND  Result Value Ref Range Status   Specimen Description BLOOD RIGHT HAND  Final   Special Requests   Final    BOTTLES DRAWN AEROBIC AND ANAEROBIC Blood Culture adequate volume   Culture    Final    NO GROWTH 5 DAYS Performed at [facility], [place] [address], [place],  [address]    Report Status 10/17/2021 FINAL  Final         Radiology Studies: DG Chest 2 View  Result Date: 10/15/2021 CLINICAL DATA:  Hypoxia EXAM: CHEST - 2 VIEW COMPARISON:  Previous studies including the examination of 10/11/2021 FINDINGS: Transverse diameter of heart is increased. Central pulmonary vessels are more prominent. Increased interstitial markings are seen in the parahilar regions. There is blunting of right lateral CP angle. There is no pneumothorax. Pacemaker battery is seen in the left infraclavicular region. IMPRESSION: Cardiomegaly. Central pulmonary vessels are more prominent suggesting CHF. Small right pleural effusion. Electronically Signed   By: Elmer Picker M.D.   On: 10/15/2021 16:00        Scheduled Meds:  aspirin EC  81 mg Oral Daily   cefdinir  300 mg Oral Daily   DULoxetine  30 mg Oral Daily   insulin aspart  0-9 Units Subcutaneous TID WC   pantoprazole  20 mg Oral Daily   polyvinyl alcohol  1 drop Both Eyes TID   rosuvastatin  40 mg Oral Daily   Continuous Infusions:  sodium chloride Stopped (10/15/21 2139)     LOS: 5 days    Time spent: 35 minutes    Berneita Sanagustin A Justene Jensen, MD [facility]   If 7PM-7AM, please contact night-coverage [email or web address]  10/17/2021, 8:16 AM

## 2021-10-18 ENCOUNTER — Encounter (HOSPITAL_COMMUNITY): Payer: Self-pay | Admitting: Internal Medicine

## 2021-10-18 DIAGNOSIS — I950 Idiopathic hypotension: Secondary | ICD-10-CM | POA: Diagnosis not present

## 2021-10-18 LAB — GLUCOSE, CAPILLARY
Glucose-Capillary: 88 mg/dL (ref 70–99)
Glucose-Capillary: 151 mg/dL — ABNORMAL HIGH (ref 70–99)

## 2021-10-18 LAB — BASIC METABOLIC PANEL
Anion gap: 12 (ref 5–15)
BUN: 26 mg/dL — ABNORMAL HIGH (ref 8–23)
CO2: 29 mmol/L (ref 22–32)
Calcium: 10.7 mg/dL — ABNORMAL HIGH (ref 8.9–10.3)
Chloride: 95 mmol/L — ABNORMAL LOW (ref 98–111)
Creatinine, Ser: 1.42 mg/dL — ABNORMAL HIGH (ref 0.44–1.00)
GFR, Estimated: 35 mL/min — ABNORMAL LOW (ref >60)
Glucose, Bld: 93 mg/dL (ref 70–99)
Potassium: 3.7 mmol/L (ref 3.5–5.1)
Sodium: 136 mmol/L (ref 135–145)

## 2021-10-18 LAB — IRON AND TIBC
Iron: 30 ug/dL (ref 28–170)
Saturation Ratios: 12 % (ref 10.4–31.8)
TIBC: 245 ug/dL — ABNORMAL LOW (ref 250–450)
UIBC: 215 ug/dL

## 2021-10-18 LAB — FERRITIN: Ferritin: 105 ng/mL (ref 11–307)

## 2021-10-18 MED ORDER — CEFDINIR 300 MG PO CAPS
300.0000 mg | ORAL_CAPSULE | Freq: Every day | ORAL | Status: AC
  Administered 2021-10-18: 300 mg via ORAL
  Filled 2021-10-18: qty 1

## 2021-10-18 MED ORDER — TORSEMIDE 20 MG PO TABS: 20.0000 mg | ORAL_TABLET | Freq: Every day | ORAL | 0 refills | Status: DC | PRN

## 2021-10-18 MED ORDER — TRAMADOL HCL 50 MG PO TABS: 50.0000 mg | ORAL_TABLET | Freq: Four times a day (QID) | ORAL | 0 refills | Status: AC | PRN

## 2021-10-18 MED FILL — Cefazolin Sodium-Dextrose IV Solution 2 GM/100ML-4%: INTRAVENOUS | Qty: 100 | Status: AC

## 2021-10-18 NOTE — Progress Notes (Signed)
Electrophysiology Rounding Note  Patient Name: Evelyn Simmons Date of Encounter: 10/18/2021  Primary Cardiologist: Virl Axe, MD Electrophysiologist: None   Subjective   The patient is doing well today, ready to go home.  At this time, the patient denies chest pain, shortness of breath, or any new concerns.  Inpatient Medications    Scheduled Meds:  aspirin EC  81 mg Oral Daily   DULoxetine  30 mg Oral Daily   insulin aspart  0-9 Units Subcutaneous TID WC   pantoprazole  20 mg Oral Daily   polyvinyl alcohol  1 drop Both Eyes TID   rosuvastatin  40 mg Oral Daily   Continuous Infusions:  sodium chloride Stopped (10/15/21 2139)   PRN Meds: acetaminophen, docusate sodium, haloperidol lactate, loratadine, mouth rinse, polyethylene glycol, sodium chloride   Vital Signs    Vitals:   10/17/21 1810 10/17/21 1815 10/17/21 2000 10/18/21 0200  BP: (!) 127/54 (!) 131/33 113/60 (!) 136/48  Pulse: 70 70 67 70  Resp:   20 18  Temp:   98.2 F (36.8 C) 98.1 F (36.7 C)  TempSrc:   Oral Oral  SpO2: 100% 99% 90% 92%  Weight:      Height:        Intake/Output Summary (Last 24 hours) at 10/18/2021 0851 Last data filed at 10/18/2021 0100 Gross per 24 hour  Intake 520 ml  Output 900 ml  Net -380 ml   Filed Weights   10/15/21 0500 10/16/21 0248 10/17/21 0456  Weight: 68.9 kg 70.7 kg 67.3 kg    Physical Exam    GEN- The patient is well appearing, alert and oriented x 3 today.   Head- normocephalic, atraumatic Eyes-  Sclera clear, conjunctiva pink Ears- hearing intact Oropharynx- clear Neck- supple Lungs- Clear to ausculation bilaterally, normal work of breathing Heart- Regular rate and rhythm, no murmurs, rubs or gallops GI- soft, NT, ND, + BS Extremities- no clubbing or cyanosis. No edema Skin- no rash or lesion Psych- euthymic mood, full affect Neuro- strength and sensation are intact  Labs    CBC Recent Labs    10/16/21 0235 10/17/21 0929  WBC 6.8 6.3   NEUTROABS 4.2 4.0  HGB 8.6* 9.3*  HCT 26.6* 29.7*  MCV 97.1 97.7  PLT 296 923   Basic Metabolic Panel Recent Labs    10/16/21 0235 10/17/21 0929  NA 139 136  K 4.0 3.6  CL 102 95*  CO2 28 31  GLUCOSE 97 152*  BUN 37* 31*  CREATININE 1.49* 1.53*  CALCIUM 10.5* 10.5*  MG  --  1.7   Liver Function Tests No results for input(s): "AST", "ALT", "ALKPHOS", "BILITOT", "PROT", "ALBUMIN" in the last 72 hours. No results for input(s): "LIPASE", "AMYLASE" in the last 72 hours. Cardiac Enzymes No results for input(s): "CKTOTAL", "CKMB", "CKMBINDEX", "TROPONINI" in the last 72 hours.   Telemetry    NSR 80-90s, paced (personally reviewed)  Radiology    No results found.  Patient Profile     Evelyn Simmons is a 88 y.o. female with a history of SSS s/p [employer] DDD PPM 2014, s/p AV nodal ablation 12/2019, not on [place] due to recurrent GI bleeding, mild/mod MR, and chronic dyspnea who is being seen today for the evaluation of Pacemaker End of Service at the request of Dr. Tyrell Antonio.  Assessment & Plan   1.  SSS s/p [employer] DDD PPM 2014 S/p gen change 9/6 with Dr. Caryl Comes. Wound care reviewed with patient and placed  in AVS. Usual f/up arranged.    2. Permananent AF  3. S/p AV nodal ablation Not on [place] with recurrent GI bleeding.   4. UTI 5. Sepsis Resolved. Per primary.   5. CAD s/p stenting  6. Anemia Hgb running in mid 8-9 throughout hospitalization. Last iron studies 10/2019. Updated iron studies ordered. Mgmt per primary team   [place] will sign off.   EP to see PRN while inpatient.  Outpatient follow-up scheduled  For questions or updates, please contact [facility] Please consult [email or web address] for contact info under Cardiology/STEMI.  Signed, Mamie Levers, NP  10/18/2021, 8:51 AM

## 2021-10-18 NOTE — Progress Notes (Signed)
Physical Therapy Treatment Patient Details Name: Evelyn Simmons MRN: [id number] DOB: [birth year] Today's Date: 10/18/2021   History of Present Illness Evelyn Simmons is an 9yoF who presents on 8/31 when [facility] called EMS for pt down on kitchen floor.  Pt hypotensive and with AMS. PPM generator exchange 9/6. PMH: COPD,  combined CHF, AF, PPM, HTN, hypoTSH.    PT Comments    Pt tolerates treatment well, ambulating for increased distances. Pt does continue to reprot DOE, although sats remain stable in low 90s. Pt is encouraged to mobilize frequently in an effort to continue improving endurance and strength.   Recommendations for follow up therapy are one component of a multi-disciplinary discharge planning process, led by the attending physician.  Recommendations may be updated based on patient status, additional functional criteria and insurance authorization.  Follow Up Recommendations  Home health PT     Assistance Recommended at Discharge Intermittent Supervision/Assistance  Patient can return home with the following A little help with walking and/or transfers;A little help with bathing/dressing/bathroom;Assist for transportation;Direct supervision/assist for medications management;Direct supervision/assist for financial management;Assistance with cooking/housework   Equipment Recommendations  None recommended by PT    Recommendations for Other Services       Precautions / Restrictions Precautions Precautions: Fall Restrictions Weight Bearing Restrictions: No     Mobility  Bed Mobility Overal bed mobility: Needs Assistance Bed Mobility: Sit to Supine       Sit to supine: Supervision        Transfers Overall transfer level: Needs assistance Equipment used: Rollator (4 wheels) Transfers: Sit to/from Stand Sit to Stand: Supervision                Ambulation/Gait Ambulation/Gait assistance: Supervision Gait Distance (Feet): 100 Feet (100' x 2) Assistive  device: Rollator (4 wheels) Gait Pattern/deviations: Step-through pattern Gait velocity: reduced Gait velocity interpretation: <1.8 ft/sec, indicate of risk for recurrent falls   General Gait Details: steady step-through gait, one seated rest break due to DOE   [employer]    Modified Rankin (Stroke Patients Only)       Balance Overall balance assessment: Needs assistance Sitting-balance support: No upper extremity supported, Feet supported Sitting balance-Leahy Scale: Good     Standing balance support: Single extremity supported, Reliant on assistive device for balance Standing balance-Leahy Scale: Poor                              Cognition Arousal/Alertness: Awake/alert Behavior During Therapy: WFL for tasks assessed/performed Overall Cognitive Status: Impaired/Different from baseline Area of Impairment: Memory                     Memory: Decreased short-term memory                  Exercises      General Comments General comments (skin integrity, edema, etc.): pt mobilizing on RA, sats stable in 90s. Pt does demonstrate increased work of breathing, requiring seated rest break during ambulation      Pertinent Vitals/Pain Pain Assessment Pain Assessment: No/denies pain    Home Living                          Prior Function            PT Goals (current goals can now be found in  the care plan section) Acute Rehab PT Goals Patient Stated Goal: go to rehab per patient Progress towards PT goals: Progressing toward goals    Frequency    Min 3X/week      PT Plan Current plan remains appropriate    Co-evaluation              AM-PAC PT "6 Clicks" Mobility   Outcome Measure  Help needed turning from your back to your side while in a flat bed without using bedrails?: None Help needed moving from lying on your back to sitting on the side of a flat bed without using bedrails?:  None Help needed moving to and from a bed to a chair (including a wheelchair)?: A Little Help needed standing up from a chair using your arms (e.g., wheelchair or bedside chair)?: A Little Help needed to walk in hospital room?: A Little Help needed climbing 3-5 steps with a railing? : A Little 6 Click Score: 20    End of Session   Activity Tolerance: Patient tolerated treatment well Patient left: in bed;with call bell/phone within reach;with bed alarm set Nurse Communication: Mobility status PT Visit Diagnosis: Muscle weakness (generalized) (M62.81);History of falling (Z91.81);Difficulty in walking, not elsewhere classified (R26.2)     Time: 7408-1448 PT Time Calculation (min) (ACUTE ONLY): 25 min  Charges:  $Gait Training: 8-22 mins $Therapeutic Activity: 8-22 mins                     Zenaida Niece, PT, DPT Acute Rehabilitation Office [place] Evelyn Simmons 10/18/2021, 12:20 PM

## 2021-10-18 NOTE — TOC Transition Note (Signed)
Transition of Care [facility]) - CM/SW Discharge Note   Patient Details  Name: Jaelle Campanile MRN: [id number] Date of Birth: 1929/12/29  Transition of [facility] CM/SW Contact:  Bethann Berkshire, [place] Phone Number: 10/18/2021, 12:25 PM   Clinical Narrative:     CSW received voicemail from [facility] that they cannot transport from [place] since they are in [place]. Informed that CSW will need to arrange transport.   Patient will DC to: [facility] Anticipated DC date: 10/18/21 Family notified: Daughter Cytogeneticist by: Corey Harold   Per MD patient ready for DC to [facility]. RN, patient, patient's family, and facility notified of DC. Discharge Summary and FL2 sent to facility. RN to call report prior to discharge (Room F4 bed 1 [phone]). DC packet on chart. Ambulance transport requested for patient.   CSW will sign off for now as social work intervention is no longer needed. Please consult [place] again if new needs arise.   Final next level of care: Skilled Nursing Facility Barriers to Discharge: No Barriers Identified   Patient Goals and CMS Choice        Discharge Placement              Patient chooses bed at:  ([facility]) Patient to be transferred to facility by: [place] Name of family member notified: Daughter Polly Patient and family notified of of transfer: 10/18/21  Discharge Plan and Services                                     Social Determinants of Health (SDOH) Interventions     Readmission Risk Interventions     No data to display

## 2021-10-18 NOTE — Discharge Summary (Addendum)
Physician Discharge Summary   Patient: Evelyn Simmons MRN: [id number] DOB: 1929-04-15  Admit date:     10/11/2021  Discharge date: 10/18/21  Discharge Physician: Elmarie Shiley   PCP: Dionicia Abler, NP (Inactive)   Recommendations at discharge:   Follow up with EP 9/18 at 8;25 am.  Monitor volume status.  Needs 2 L oxygen nocturnal.   Discharge Diagnoses: Principal Problem:   Hypotension Active Problems:   Acute on chronic systolic CHF (congestive heart failure) (HCC)   Septic shock (HCC)   Acute cystitis without hematuria   Syncope and collapse  Resolved Problems:   * No resolved hospital problems. [facility] Course: 88 year old with past medical history hypertension, hyperlipidemia, CAD TEE S 2011, MI, CHF/ICM ejection fraction 45% echo 2022, PAF , CHB status post pacemaker, COPD, diabetes type 2, CKD, hypothyroidism who presented to the ED after a fall at home EMS notified by life alert found down on the kitchen floor,  initially incoherent became more lucid around, noted to be bradycardic heart rate in the 40s also noted to have emesis x3.  Patient noted to have had a few day history of decreased oral intake, recently diagnosed with UTI and prescribed Macrobid.  She was found to be hypotensive in the ED with a MAP of 50, received IV fluids with no improvement.  She was started on IV pressors.  UTI was concerning for UTI.  Patient was placed on IV antibiotics.  Cardiology consulted who assessed patient, reviewed 2D echo and felt nothing was added from cardiology standpoint. EP was consulted, it was brought to their attention by [employer] rep who had interrogated patient's device earlier in the admission noted that she has met EOS. PT recommended generator change prior to discharge.  Plan to proceed today.    Assessment and Plan:   Septic shock likely secondary to UTI, POA: Patient admitted and treated for sepsis with septic shock requiring IV fluids and pressor  support. Urinalysis was done and concerning with UTI. Urine culture no growth today. He has been weaned off of IV pressors. For chronotropic incompetency as well as persistent hypotension cardiology consulted, no future recommendation. She will complete Omnicef total 7  days of treatment today.,    Permanent A-fib, complete heart block status post AV ablation SSS status post [facility]  DDD PPM 2014. Ozzie Hoyle of CAD status post DES/history of MI/CHF/ICM/elevated troponin Patient needs generator change, EP planning to exchange today. Elevated troponin felt likely secondary to demand ischemia.  PCCM 2D echo ejection fraction 45 to 50%, mild reduced right ventricular systolic function.severely dilated left atrial size, severe MVR, mild aortic valve stenosis. Patient seen in consultation by cardiology with nothing further to add at this time, recommended goals of care. EP exchange generator 9/6     Acute on chronic systolic heart failure Patient was noted to have new hypoxia. He was complaining of shortness of breath. Chest  x-ray done 9/4 concerning for volume overload, BNP 728 She has received IV Lasix this 20 mg IV x1 on 9/4 and 20 mg IV every 12 hours x2 doses 9/5. Good urine output yesterday 4.8 L.  She has been n.p.o. awaiting for generator exchange Holding  IV Lasix today due to avoid further hypotension and increase cr.   plan to resume lasix PRN at discharge.   AKI on CKD stage IIIb: Plan to hold for the Lasix today Urine for UTI  renal function stable.   COPD/hypoxia Required 2 L of oxygen at night  per [facility].  Received IV lasix.  Wean off oxygen as tolerated.    Diabetes type 2: Continue with a sliding scale insulin   GERD: Continue with PPI   Questionable hypothyroidism;  ruled out Synthroid not noted on MAR. Dr. Grandville Silos spoke with PCP and patient was not on thyroid replacement therapy.  TSH within normal limits.         Pressure Injury 10/12/21 Buttocks Bilateral  Deep Tissue Pressure Injury - Purple or maroon localized area of discolored intact skin or blood-filled blister due to damage of underlying soft tissue from pressure and/or shear. dark purple areas bilateral bu (Active)  10/12/21 1930  Location: Buttocks  Location Orientation: Bilateral  Staging: Deep Tissue Pressure Injury - Purple or maroon localized area of discolored intact skin or blood-filled blister due to damage of underlying soft tissue from pressure and/or shear.  Wound Description (Comments): dark purple areas bilateral buttocks  Present on Admission: Yes  Dressing Type Foam - Lift dressing to assess site every shift 10/16/21 2100        Estimated body mass index is 29.95 kg/m as calculated from the following:   Height as of this encounter: '4\' 11"'  (1.499 m).   Weight as of this encounter: 67.3 kg.        Consultants: Cardiology  Procedures performed: Generator exchange Disposition: Skilled nursing facility Diet recommendation:  Discharge Diet Orders (From admission, onward)     Start     Ordered   10/18/21 0000  Diet - low sodium heart healthy        10/18/21 1156           Cardiac diet DISCHARGE MEDICATION: Allergies as of 10/18/2021       Reactions   Penicillins Nausea And Vomiting, Rash, Hives   Other Other (See Comments)   Sulfa Antibiotics    Pt unsure reaction, was told in hospital she had allergy   Penicillin G Rash        Medication List     STOP taking these medications    Ativan 0.5 MG tablet Generic drug: LORazepam   Cipro 500 MG tablet Generic drug: ciprofloxacin   DULoxetine 30 MG capsule Commonly known as: CYMBALTA   furosemide 20 MG tablet Commonly known as: LASIX   loperamide 2 MG capsule Commonly known as: IMODIUM   Macrobid 100 MG capsule Generic drug: nitrofurantoin (macrocrystal-monohydrate)   metolazone 2.5 MG tablet Commonly known as: ZAROXOLYN   potassium chloride 20 MEQ packet Commonly known as: KLOR-CON    Tamiflu 30 MG capsule Generic drug: oseltamivir       TAKE these medications    Accu-Chek Nano SmartView w/Device Kit Please issue nano meter by AccuChek along with test strips at checking frequency of 2x daily. 90 day supply. E11.9   acetaminophen 650 MG CR tablet Commonly known as: TYLENOL SMARTSIG:1 Tablet(s) By Mouth Twice Daily What changed: Another medication with the same name was removed. Continue taking this medication, and follow the directions you see here.   antiseptic oral rinse Liqd 10 mLs by Mouth Rinse route 2 (two) times daily as needed for dry mouth.   aspirin EC 81 MG tablet Take 81 mg by mouth daily.   cholecalciferol 1000 units tablet Commonly known as: VITAMIN D Take 1,000 Units by mouth daily.   cyanocobalamin 1000 MCG tablet Take 1 tablet (1,000 mcg total) by mouth daily.   diclofenac Sodium 1 % Gel Commonly known as: VOLTAREN Apply 1 application topically 4 (four) times daily  as needed (left knee pain.).   famotidine 20 MG tablet Commonly known as: PEPCID Take 20 mg by mouth daily.   FeroSul 325 (65 FE) MG tablet Generic drug: ferrous sulfate Take 325 mg by mouth daily with breakfast.   freestyle lancets Use as instructed   glucose blood test strip Commonly known as: FREESTYLE LITE Use as instructed   lidocaine 5 % Commonly known as: LIDODERM Place 1 patch onto the skin daily as needed (knee pain.). Remove & Discard patch within 12 hours or as directed by MD   loratadine 10 MG tablet Commonly known as: CLARITIN Take 10 mg by mouth daily.   naloxone 4 MG/0.1ML Liqd nasal spray kit Commonly known as: NARCAN Place 1 spray into the nose See admin instructions. Administer 1 spray in one nostril. Repeat every 3 minutes if no response.   nitroGLYCERIN 0.4 MG SL tablet Commonly known as: NITROSTAT Place 0.4 mg under the tongue every 5 (five) minutes x 3 doses as needed for chest pain.   nystatin-triamcinolone cream Commonly known as:  MYCOLOG II Apply 1 application topically 3 (three) times daily as needed (affected area(s)).   pantoprazole 20 MG tablet Commonly known as: PROTONIX Take 20 mg by mouth daily.   polyethylene glycol 17 g packet Commonly known as: MIRALAX / GLYCOLAX Take 17 g by mouth daily as needed (constipation.).   PreserVision AREDS 2+Multi Vit Caps Take 1 capsule by mouth 2 (two) times daily.   QUEtiapine 25 MG tablet Commonly known as: SEROQUEL Take 25 mg by mouth at bedtime.   rosuvastatin 40 MG tablet Commonly known as: CRESTOR Take 1 tablet (40 mg total) by mouth daily.   torsemide 20 MG tablet Commonly known as: DEMADEX Take 1 tablet (20 mg total) by mouth daily as needed. Take 2 tablets (40 mg) by mouth once daily What changed:  how much to take how to take this when to take this reasons to take this   traMADol 50 MG tablet Commonly known as: ULTRAM Take 50 mg by mouth every 6 (six) hours as needed for pain.   traZODone 100 MG tablet Commonly known as: DESYREL Take 100 mg by mouth at bedtime.   Tussin Cough+Chest Congest DM 10-100 MG/5ML liquid Generic drug: dextromethorphan-guaiFENesin Take 10 mLs by mouth at bedtime as needed for cough.               Discharge Care Instructions  (From admission, onward)           Start     Ordered   10/18/21 0000  Discharge wound care:       Comments: See above   10/18/21 1156            Follow-up [place] Follow up.   Specialty: Cardiology Why: On 9/18 at 8:25 Contact information: [address], [place] [id number] [place] [phone]               Discharge Exam: Danley Danker Weights   10/15/21 0500 10/16/21 0248 10/17/21 0456  Weight: 68.9 kg 70.7 kg 67.3 kg   General NAD  Condition at discharge: stable  The results of significant diagnostics from this hospitalization (including  imaging, microbiology, ancillary and laboratory) are listed below for reference.   Imaging Studies: DG Chest 2 View  Result Date: 10/15/2021 CLINICAL DATA:  Hypoxia EXAM: CHEST - 2 VIEW COMPARISON:  Previous studies  including the examination of 10/11/2021 FINDINGS: Transverse diameter of heart is increased. Central pulmonary vessels are more prominent. Increased interstitial markings are seen in the parahilar regions. There is blunting of right lateral CP angle. There is no pneumothorax. Pacemaker battery is seen in the left infraclavicular region. IMPRESSION: Cardiomegaly. Central pulmonary vessels are more prominent suggesting CHF. Small right pleural effusion. Electronically Signed   By: Elmer Picker M.D.   On: 10/15/2021 16:00   ECHOCARDIOGRAM COMPLETE  Result Date: 10/12/2021    ECHOCARDIOGRAM REPORT   Patient Name:   MARRIAN BELLS Date of Exam: 10/12/2021 Medical Rec #:  [id number]     Height:       59.0 in Accession #:    [id number]    Weight:       147.7 lb Date of Birth:  October 22, 1929      BSA:          1.621 m Patient Age:    29 years      BP:           116/44 mmHg Patient Gender: F             HR:           58 bpm. Exam Location:  Inpatient Procedure: 2D Echo, Cardiac Doppler, Color Doppler and Intracardiac            Opacification Agent                                MODIFIED REPORT:  This report was modified by Cherlynn Kaiser MD on 10/12/2021 due to report send                                     to EMR.  Indications:     Cardiomyopathy  History:         Patient has prior history of Echocardiogram examinations, most                  recent 12/12/2020. CHF, CAD, Pacemaker, Arrythmias:Atrial                  Fibrillation; Risk Factors:Diabetes, Dyslipidemia and Former                  Smoker.  Sonographer:     Clayton Lefort RDCS (AE) Referring Phys:  [id number] Lowella Dell REESE Diagnosing Phys: Cherlynn Kaiser MD  Sonographer Comments: Technically difficult study due to poor echo windows and no subcostal  window obtained. IMPRESSIONS  1. The mitral valve is degenerative. Severe mitral valve regurgitation, posteriorly directed with posterior leaflet restriction and anterior leaflet override. No evidence of mitral stenosis. Severe mitral annular calcification.  2. Left ventricular ejection fraction, by estimation, is 45 to 50%. The left ventricle has mildly decreased function. The left ventricle demonstrates regional wall motion abnormalities (see scoring diagram/findings for description). Left ventricular diastolic parameters are indeterminate.  3. Right ventricular systolic function is mildly reduced. The right ventricular size is moderately enlarged.  4. Left atrial size was severely dilated.  5. The aortic valve is abnormal. There is severe calcifcation of the aortic valve. Aortic valve regurgitation is trivial. Mild aortic valve stenosis. Aortic valve mean gradient measures 8.0 mmHg. FINDINGS  Left Ventricle: Left ventricular ejection fraction, by estimation, is 45 to 50%. The left ventricle has mildly decreased function. The left ventricle  demonstrates regional wall motion abnormalities. Definity contrast agent was given IV to delineate the left ventricular endocardial borders. The left ventricular internal cavity size was normal in size. There is no left ventricular hypertrophy. Left ventricular diastolic parameters are indeterminate.  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 The basal anteroseptal segment and basal inferoseptal segment are akinetic. Right Ventricle: The right ventricular size is moderately enlarged. No increase in right ventricular wall thickness. Right ventricular systolic function is mildly reduced. Left Atrium: Left atrial size was severely dilated. Right Atrium: Right atrial size was normal in size. Pericardium: There is no evidence of pericardial effusion. Mitral Valve: The mitral valve is degenerative in appearance. Severe mitral annular calcification. Severe mitral  valve regurgitation. No evidence of mitral valve stenosis. The mean mitral valve gradient is 2.7 mmHg. Tricuspid Valve: The tricuspid valve is normal in structure. Tricuspid valve regurgitation is not demonstrated. No evidence of tricuspid stenosis. Aortic Valve: The aortic valve is abnormal. There is severe calcifcation of the aortic valve. Aortic valve regurgitation is trivial. Mild aortic stenosis is present. Aortic valve mean gradient measures 8.0 mmHg. Aortic valve peak gradient measures 15.7 mmHg. Aortic valve area, by VTI measures 0.92 cm. Pulmonic Valve: The pulmonic valve was normal in structure. Pulmonic valve regurgitation is trivial. No evidence of pulmonic stenosis. Aorta: The aortic root is normal in size and structure. Venous: The inferior vena cava was not well visualized. IAS/Shunts: The interatrial septum was not assessed.  LEFT VENTRICLE PLAX 2D LVIDd:         4.70 cm LVIDs:         3.40 cm LV PW:         1.20 cm LV IVS:        0.84 cm LVOT diam:     1.90 cm LV SV:         40 LV SV Index:   25 LVOT Area:     2.84 cm  RIGHT VENTRICLE RV Basal diam:  4.20 cm RV Mid diam:    3.60 cm RV S prime:     7.62 cm/s TAPSE (M-mode): 2.0 cm LEFT ATRIUM              Index        RIGHT ATRIUM           Index LA diam:        5.70 cm  3.52 cm/m   RA Area:     20.70 cm LA Vol (A2C):   161.0 ml 99.29 ml/m  RA Volume:   51.00 ml  31.45 ml/m LA Vol (A4C):   144.0 ml 88.81 ml/m LA Biplane Vol: 156.0 ml 96.21 ml/m  AORTIC VALVE AV Area (Vmax):    1.01 cm AV Area (Vmean):   0.92 cm AV Area (VTI):     0.92 cm AV Vmax:           198.00 cm/s AV Vmean:          130.000 cm/s AV VTI:            0.435 m AV Peak Grad:      15.7 mmHg AV Mean Grad:      8.0 mmHg LVOT Vmax:         70.30 cm/s LVOT Vmean:        42.000 cm/s LVOT VTI:          0.141 m LVOT/AV VTI ratio: 0.32  AORTA Ao Root diam: 2.90 cm Ao Asc diam:  3.70 cm MITRAL VALVE MV Mean grad: 2.7 mmHg SHUNTS                        Systemic VTI:  0.14 m                         Systemic Diam: 1.90 cm Cherlynn Kaiser MD Electronically signed by Cherlynn Kaiser MD Signature Date/Time: 10/12/2021/5:35:26 PM    Final (Updated)    CT Head Wo Contrast  Result Date: 10/11/2021 CLINICAL DATA:  Fall, head and neck trauma. EXAM: CT HEAD WITHOUT CONTRAST CT CERVICAL SPINE WITHOUT CONTRAST TECHNIQUE: Multidetector CT imaging of the head and cervical spine was performed following the standard protocol without intravenous contrast. Multiplanar CT image reconstructions of the cervical spine were also generated. RADIATION DOSE REDUCTION: This exam was performed according to the departmental dose-optimization program which includes automated exposure control, adjustment of the mA and/or kV according to patient size and/or use of iterative reconstruction technique. COMPARISON:  04/26/2021. FINDINGS: CT HEAD FINDINGS Brain: No acute intracranial hemorrhage, midline shift or mass effect. No extra-axial fluid collection. Diffuse atrophy is noted. Subcortical and periventricular white matter hypodensities are present bilaterally. No hydrocephalus. Vascular: Atherosclerotic calcification of the carotid siphons and vertebral arteries. No hyperdense vessel. Skull: No acute fracture. Sinuses/Orbits: No acute finding. Other: None. CT CERVICAL SPINE FINDINGS Alignment: Mild anterolisthesis at C5-C6 and C7-T1. Skull base and vertebrae: No acute fracture. Soft tissues and spinal canal: No prevertebral fluid or swelling. No visible canal hematoma. Disc levels: Multilevel intervertebral disc space narrowing, uncovertebral osteophyte formation, and facet arthropathy. Upper chest: Emphysematous changes in the lungs. Mild dependent atelectasis bilaterally. Aortic atherosclerosis. Other: Carotid artery calcifications. IMPRESSION: 1. No acute intracranial process. 2. Atrophy with chronic microvascular ischemic changes. 3. Multilevel degenerative changes in the cervical spine without evidence of acute fracture.  Electronically Signed   By: Brett Fairy M.D.   On: 10/11/2021 22:02   CT Cervical Spine Wo Contrast  Result Date: 10/11/2021 CLINICAL DATA:  Fall, head and neck trauma. EXAM: CT HEAD WITHOUT CONTRAST CT CERVICAL SPINE WITHOUT CONTRAST TECHNIQUE: Multidetector CT imaging of the head and cervical spine was performed following the standard protocol without intravenous contrast. Multiplanar CT image reconstructions of the cervical spine were also generated. RADIATION DOSE REDUCTION: This exam was performed according to the departmental dose-optimization program which includes automated exposure control, adjustment of the mA and/or kV according to patient size and/or use of iterative reconstruction technique. COMPARISON:  04/26/2021. FINDINGS: CT HEAD FINDINGS Brain: No acute intracranial hemorrhage, midline shift or mass effect. No extra-axial fluid collection. Diffuse atrophy is noted. Subcortical and periventricular white matter hypodensities are present bilaterally. No hydrocephalus. Vascular: Atherosclerotic calcification of the carotid siphons and vertebral arteries. No hyperdense vessel. Skull: No acute fracture. Sinuses/Orbits: No acute finding. Other: None. CT CERVICAL SPINE FINDINGS Alignment: Mild anterolisthesis at C5-C6 and C7-T1. Skull base and vertebrae: No acute fracture. Soft tissues and spinal canal: No prevertebral fluid or swelling. No visible canal hematoma. Disc levels: Multilevel intervertebral disc space narrowing, uncovertebral osteophyte formation, and facet arthropathy. Upper chest: Emphysematous changes in the lungs. Mild dependent atelectasis bilaterally. Aortic atherosclerosis. Other: Carotid artery calcifications. IMPRESSION: 1. No acute intracranial process. 2. Atrophy with chronic microvascular ischemic changes. 3. Multilevel degenerative changes in the cervical spine without evidence of acute fracture. Electronically Signed   By: Brett Fairy M.D.   On: 10/11/2021 22:02   DG  Chest Portable 1  View  Result Date: 10/11/2021 CLINICAL DATA:  Fall trauma EXAM: PORTABLE CHEST 1 VIEW COMPARISON:  04/26/2021 FINDINGS: Left-sided pacing device as before. Cardiomegaly with central vascular congestion. No acute airspace disease, pleural effusion or pneumothorax. Aortic atherosclerosis. IMPRESSION: Cardiomegaly with central vascular congestion. Electronically Signed   By: Donavan Foil M.D.   On: 10/11/2021 21:45    Microbiology: Results for orders placed or performed during the hospital encounter of 10/11/21  MRSA Next Gen by PCR, Nasal     Status: None   Collection Time: 10/12/21  2:41 AM   Specimen: Nasal Mucosa; Nasal Swab  Result Value Ref Range Status   MRSA by PCR Next Gen NOT DETECTED NOT DETECTED Final    Comment: (NOTE) The GeneXpert MRSA Assay (FDA approved for NASAL specimens only), is one component of a comprehensive MRSA colonization surveillance program. It is not intended to diagnose MRSA infection nor to guide or monitor treatment for MRSA infections. Test performance is not FDA approved in patients less than 63 years old. Performed at [facility], [place] [address] [place], [place] [address]   Urine Culture     Status: None   Collection Time: 10/12/21 11:19 AM   Specimen: Urine, Clean Catch  Result Value Ref Range Status   Specimen Description URINE, CLEAN CATCH  Final   Special Requests NONE  Final   Culture   Final    NO GROWTH Performed at [facility], [place] [address], [place], [place] [address]    Report Status 10/13/2021 FINAL  Final  Culture, blood (Routine X 2) w Reflex to ID Panel     Status: None   Collection Time: 10/12/21 11:50 AM   Specimen: BLOOD  Result Value Ref Range Status   Specimen Description BLOOD RIGHT ANTECUBITAL  Final   Special Requests   Final    BOTTLES DRAWN AEROBIC AND ANAEROBIC Blood Culture adequate volume   Culture   Final    NO GROWTH 5 DAYS Performed at [facility], [place] [address]  [place], [place] [address]    Report Status 10/17/2021 FINAL  Final  Culture, blood (Routine X 2) w Reflex to ID Panel     Status: None   Collection Time: 10/12/21 12:05 PM   Specimen: BLOOD RIGHT HAND  Result Value Ref Range Status   Specimen Description BLOOD RIGHT HAND  Final   Special Requests   Final    BOTTLES DRAWN AEROBIC AND ANAEROBIC Blood Culture adequate volume   Culture   Final    NO GROWTH 5 DAYS Performed at [facility], [place] [address], [place], [place] [address]    Report Status 10/17/2021 FINAL  Final  Surgical pcr screen     Status: None   Collection Time: 10/17/21 10:39 AM   Specimen: Nasal Mucosa; Nasal Swab  Result Value Ref Range Status   MRSA, PCR NEGATIVE NEGATIVE Final   Staphylococcus aureus NEGATIVE NEGATIVE Final    Comment: (NOTE) The Xpert SA Assay (FDA approved for NASAL specimens in patients 58 years of age and older), is one component of a comprehensive surveillance program. It is not intended to diagnose infection nor to guide or monitor treatment. Performed at [facility], [place] [address] [place], [place] [address]     Labs: CBC: Recent Labs  Lab 10/11/21 2133 10/12/21 0627 10/15/21 0226 10/16/21 0235 10/17/21 0929  WBC 10.9* 8.5 6.3 6.8 6.3  NEUTROABS 9.0*  --   --  4.2 4.0  HGB 9.8* 8.6* 8.4* 8.6* 9.3*  HCT 31.1* 26.3* 26.5* 26.6* 29.7*  MCV 100.6* 97.4 100.0 97.1 97.7  PLT 267 293 246 296 837   Basic Metabolic Panel: Recent Labs  Lab 10/12/21 0045 10/12/21 0627 10/12/21 1610 10/14/21 0937 10/15/21 0226 10/16/21 0235 10/17/21 0929 10/18/21 0750  NA  --  140   < > 135 139 139 136 136  K  --  3.0*   < > 4.2 4.1 4.0 3.6 3.7  CL  --  103   < > 103 105 102 95* 95*  CO2  --  26   < > '25 25 28 31 29  ' GLUCOSE  --  142*   < > 184* 129* 97 152* 93  BUN  --  55*   < > 47* 48* 37* 31* 26*  CREATININE  --  1.70*   < > 1.81* 1.86* 1.49* 1.53* 1.42*  CALCIUM  --  9.6   < > 9.9 10.0 10.5* 10.5* 10.7*  MG 1.1* 3.5*  --  2.4   --   --  1.7  --   PHOS  --  3.6  --   --   --   --   --   --    < > = values in this interval not displayed.   Liver Function Tests: No results for input(s): "AST", "ALT", "ALKPHOS", "BILITOT", "PROT", "ALBUMIN" in the last 168 hours. CBG: Recent Labs  Lab 10/17/21 0604 10/17/21 1117 10/17/21 1641 10/18/21 0639 10/18/21 1109  GLUCAP 94 130* 107* 88 151*    Discharge time spent: greater than 30 minutes.  Signed: Elmarie Shiley, MD [facility] 10/18/2021

## 2021-10-25 NOTE — Progress Notes (Deleted)
Cardiology Office Note Date:  10/25/2021  Patient ID:  Evelyn Simmons, Evelyn Simmons 08-Aug-1929, MRN [id number] PCP:  Dionicia Abler, NP (Inactive)  Electrophysiologist: Dr. Caryl Comes  ***refresh   Chief Complaint: *** wound check  History of Present Illness: Evelyn Simmons is a 88 y.o. female with history of HTN, DM, chronic CHF (HFmrEF), ICM,  hypothyroidism, CAD (remote interventions to LAD and RCA), permanent AFib (not on a/c 2/2 GIBs), SSSx s/p AV node ablation and PPM, VHD w/mild-mod MR.  She was recently admitted 10/11/21 for AMS, found hypotensive, possibly UTI/sepsis. She was also felt to have acute/chronic CHF and diuresed, echo noted severe MR, Dr. Johnsie Cancel consulted and felt more like moderate and not an intervention candidate. EP was consulted after her PPM was checked and noted to be EOS. Underwent PPM generator change 10/17/21 and discharged 10/18/21  She comes in today to for her wound check.  *** site *** volume *** symptoms  Device information BSCi dual chamber PPM implanted 09/23/2012, gen change 10/17/21   Past Medical History:  Diagnosis Date   (HFmrEF) heart failure with mid-range ejection fraction ([place])    a. 09/2019 Echo: EF 45-50%, glob HK, mild conc LVH, mildly elev PASP. Sev dil LA. Mildly dil RA. Mild MR.   Anemia    Arthritis    Asthma    CAD (coronary artery disease)    a. 1995 s/p PCI/BMS LAD; b. 2011 s/p PCI/DES RCA.   CHF (congestive heart failure) (HCC)    Chronic kidney disease    COPD (chronic obstructive pulmonary disease) (HCC)    Depression    Diabetes mellitus without complication (HCC)    Diverticulitis    Dysrhythmia    GERD (gastroesophageal reflux disease)    Hyperlipidemia    Hypertension    Hypothyroid    pt unsure, not currently on medication   Ischemic cardiomyopathy    MI (myocardial infarction) ([place])    1995   Persistent atrial fibrillation ([place])    a. No OAC 2/2 h/o bleeding/anemia; b. Longterm amio therapy; c. 08/2019 - recurrent  afib-->rate-controlled.   Presence of permanent cardiac pacemaker    Radiculopathy     Past Surgical History:  Procedure Laterality Date   ABDOMINAL HYSTERECTOMY     APPENDECTOMY     AV NODE ABLATION N/A 12/13/2019   Procedure: AV NODE ABLATION;  Surgeon: Deboraha Sprang, MD;  Location: [facility];  Service: Cardiovascular;  Laterality: N/A;   CARDIAC CATHETERIZATION     COLONOSCOPY WITH PROPOFOL N/A 10/21/2019   Procedure: COLONOSCOPY WITH PROPOFOL;  Surgeon: Lin Landsman, MD;  Location: [facility] ENDOSCOPY;  Service: Gastroenterology;  Laterality: N/A;   CORONARY ANGIOPLASTY WITH STENT PLACEMENT  12/11/1993   RCA   ESOPHAGOGASTRODUODENOSCOPY (EGD) WITH PROPOFOL N/A 10/21/2019   Procedure: ESOPHAGOGASTRODUODENOSCOPY (EGD) WITH PROPOFOL;  Surgeon: Lin Landsman, MD;  Location: [facility];  Service: Gastroenterology;  Laterality: N/A;   FOOT SURGERY Bilateral    Over 25 years ago- Bunionectomy   GIVENS CAPSULE STUDY N/A 10/22/2019   Procedure: GIVENS CAPSULE STUDY;  Surgeon: Lin Landsman, MD;  Location: [facility] ENDOSCOPY;  Service: Gastroenterology;  Laterality: N/A;   INSERT / REPLACE / REMOVE PACEMAKER  09/2012   Model # N170  serial # [id number]   JOINT REPLACEMENT     bilateral knee replacements   PPM GENERATOR CHANGEOUT N/A 10/17/2021   Procedure: PPM GENERATOR CHANGEOUT;  Surgeon: Deboraha Sprang, MD;  Location: [facility];  Service: Cardiovascular;  Laterality: N/A;    Current Outpatient Medications  Medication Sig Dispense Refill   acetaminophen (TYLENOL) 650 MG CR tablet SMARTSIG:1 Tablet(s) By Mouth Twice Daily     antiseptic oral rinse (BIOTENE) LIQD 10 mLs by Mouth Rinse route 2 (two) times daily as needed for dry mouth.     aspirin EC 81 MG tablet Take 81 mg by mouth daily.     Blood Glucose Monitoring Suppl (ACCU-CHEK NANO SMARTVIEW) W/DEVICE KIT Please issue nano meter by AccuChek along with test strips at checking frequency of 2x daily. 90 day  supply. E11.9 1 kit 1   cholecalciferol (VITAMIN D) 1000 units tablet Take 1,000 Units by mouth daily.     diclofenac Sodium (VOLTAREN) 1 % GEL Apply 1 application topically 4 (four) times daily as needed (left knee pain.).      famotidine (PEPCID) 20 MG tablet Take 20 mg by mouth daily.     FEROSUL 325 (65 Fe) MG tablet Take 325 mg by mouth daily with breakfast.     glucose blood (FREESTYLE LITE) test strip Use as instructed 200 each 3   Lancets (FREESTYLE) lancets Use as instructed 200 each 3   lidocaine (LIDODERM) 5 % Place 1 patch onto the skin daily as needed (knee pain.). Remove & Discard patch within 12 hours or as directed by MD     loratadine (CLARITIN) 10 MG tablet Take 10 mg by mouth daily.     Multiple Vitamins-Minerals (PRESERVISION AREDS 2+MULTI VIT) CAPS Take 1 capsule by mouth 2 (two) times daily.     naloxone (NARCAN) nasal spray 4 mg/0.1 mL Place 1 spray into the nose See admin instructions. Administer 1 spray in one nostril. Repeat every 3 minutes if no response.     nitroGLYCERIN (NITROSTAT) 0.4 MG SL tablet Place 0.4 mg under the tongue every 5 (five) minutes x 3 doses as needed for chest pain.      nystatin-triamcinolone (MYCOLOG II) cream Apply 1 application topically 3 (three) times daily as needed (affected area(s)).     pantoprazole (PROTONIX) 20 MG tablet Take 20 mg by mouth daily.     polyethylene glycol (MIRALAX / GLYCOLAX) 17 g packet Take 17 g by mouth daily as needed (constipation.).  (Patient not taking: Reported on 10/12/2021)     QUEtiapine (SEROQUEL) 25 MG tablet Take 25 mg by mouth at bedtime.      rosuvastatin (CRESTOR) 40 MG tablet Take 1 tablet (40 mg total) by mouth daily. 90 tablet 3   torsemide (DEMADEX) 20 MG tablet Take 1 tablet (20 mg total) by mouth daily as needed. Take 2 tablets (40 mg) by mouth once daily 30 tablet 0   traZODone (DESYREL) 100 MG tablet Take 100 mg by mouth at bedtime.     TUSSIN COUGH+CHEST CONGEST DM 10-100 MG/5ML liquid Take 10 mLs  by mouth at bedtime as needed for cough.     vitamin B-12 1000 MCG tablet Take 1 tablet (1,000 mcg total) by mouth daily. 30 tablet 1   No current facility-administered medications for this visit.    Allergies:   Penicillins, Other, Sulfa antibiotics, and Penicillin g   Social History:  The patient  reports that she quit smoking about 27 years ago. Her smoking use included cigarettes. She has quit using smokeless tobacco. She reports current alcohol use. She reports that she does not use drugs.   Family History:  The patient's family history includes Arthritis in her father and mother; Cancer in her daughter and  sister; Diabetes in her brother, mother, and sister; Heart disease in her father and mother; Hypertension in her father and mother.  ROS:  Please see the history of present illness.    All other systems are reviewed and otherwise negative.   PHYSICAL EXAM:  VS:  There were no vitals taken for this visit. BMI: There is no height or weight on file to calculate BMI. Well nourished, well developed, in no acute distress HEENT: normocephalic, atraumatic Neck: no JVD, carotid bruits or masses Cardiac:  *** RRR; no significant murmurs, no rubs, or gallops Lungs:  *** CTA b/l, no wheezing, rhonchi or rales Abd: soft, nontender MS: no deformity or *** atrophy Ext: *** no edema Skin: warm and dry, no rash Neuro:  No gross deficits appreciated Psych: euthymic mood, full affect  *** PPM site is stable, *** no tethering or discomfort   EKG:  not done today  Device interrogation done today and reviewed by myself:  ***   10/12/21: TTE 1. The mitral valve is degenerative. Severe mitral valve regurgitation,  posteriorly directed with posterior leaflet restriction and anterior  leaflet override. No evidence of mitral stenosis. Severe mitral annular  calcification.   2. Left ventricular ejection fraction, by estimation, is 45 to 50%. The  left ventricle has mildly decreased function.  The left ventricle  demonstrates regional wall motion abnormalities (see scoring  diagram/findings for description). Left ventricular  diastolic parameters are indeterminate.   3. Right ventricular systolic function is mildly reduced. The right  ventricular size is moderately enlarged.   4. Left atrial size was severely dilated.   5. The aortic valve is abnormal. There is severe calcifcation of the  aortic valve. Aortic valve regurgitation is trivial. Mild aortic valve  stenosis. Aortic valve mean gradient measures 8.0 mmHg.    12/12/2020: TTE 1. Left ventricular ejection fraction, by estimation, is 45 to 50%. The  left ventricle has mildly decreased function. The left ventricle  demonstrates regional wall motion abnormalities (anterior and septal wall  hypokinesis). Left ventricular diastolic  parameters are indeterminate.   2. Right ventricular systolic function is mildly reduced. The right  ventricular size is moderately enlarged.   3. Left atrial size was severely dilated.   4. Right atrial size was mildly dilated.   5. The mitral valve is normal in structure. Mild to moderate mitral valve  regurgitation. No evidence of mitral stenosis. Moderate mitral annular  calcification.   6. The aortic valve is normal in structure. Aortic valve regurgitation is  not visualized. Borderline mild aortic valve stenosis. Aortic valve mean  gradient measures 11.2 mmHg. Aortic valve Vmax measures 2.06 m/s.   7. The inferior vena cava is normal in size with greater than 50%  respiratory variability, suggesting right atrial pressure of 3 mmHg.   Comparison(s): LVEF 45-50%.   Recent Labs: 04/26/2021: ALT 6 10/15/2021: TSH 0.461 10/16/2021: B Natriuretic Peptide 728.1 10/17/2021: Hemoglobin 9.3; Magnesium 1.7; Platelets 316 10/18/2021: BUN 26; Creatinine, Ser 1.42; Potassium 3.7; Sodium 136  No results found for requested labs within last 365 days.   Estimated Creatinine Clearance: 21.9 mL/min (A) (by  C-G formula based on SCr of 1.42 mg/dL (H)).   Wt Readings from Last 3 Encounters:  10/17/21 148 lb 4.8 oz (67.3 kg)  04/26/21 156 lb 1.4 oz (70.8 kg)  12/05/20 158 lb (71.7 kg)     Other studies reviewed: Additional studies/records reviewed today include: summarized above  ASSESSMENT AND PLAN:  PPM Programmed VVI*** S/p Gen change ***  Permanent Afib S/p AV node ablation CHA2DS2Vasc is 6 Not felt an a//c candidate with GI bleeds  ICM EF stable 45-50% ***  CAD Remote coronary interventions ***  VHD Severe MR She was seen by Dr. Johnsie Cancel in the hospital, by his review MR moderate only Not a MV intervention candidate 2/2 age and valvular anatomy, recommended [place] discussions ***  6. HTN ***  Disposition: F/u with ***  Current medicines are reviewed at length with the patient today.  The patient did not have any concerns regarding medicines.  Venetia Night, PA-C 10/25/2021 12:22 PM     [address] [place] [address] [phone] (office)  [phone] (fax)

## 2021-10-29 ENCOUNTER — Encounter: Payer: Medicare (Managed Care) | Admitting: Physician Assistant

## 2021-11-19 NOTE — Progress Notes (Unsigned)
Electrophysiology Office Note Date: 11/19/2021  ID:  Evelyn Simmons, DOB [birth year], MRN [id number]  PCP: Dionicia Abler, NP (Inactive) Primary Cardiologist: Virl Axe, MD Electrophysiologist: Virl Axe, MD   CC: Pacemaker follow-up  Evelyn Simmons is a 88 y.o. female seen today for Virl Axe, MD for routine electrophysiology followup.   S/p PPM gen change 10/17/2021 while admitted for altered MS and hypotension, found to have possible sepsis and UTI. No BCx.   As she was found to have rapid battery depletion and EOS with unpredictable longevity, gen change was urgently performed.   Today ***  Since last being seen in our clinic the patient reports doing ***.  she denies chest pain, palpitations, dyspnea, PND, orthopnea, nausea, vomiting, dizziness, syncope, edema, weight gain, or early satiety.   Device History: [employer] Sci DDD PPM 2014, gen change 10/17/2021  Past Medical History:  Diagnosis Date   (HFmrEF) heart failure with mid-range ejection fraction ([place])    a. 09/2019 Echo: EF 45-50%, glob HK, mild conc LVH, mildly elev PASP. Sev dil LA. Mildly dil RA. Mild MR.   Anemia    Arthritis    Asthma    CAD (coronary artery disease)    a. 1995 s/p PCI/BMS LAD; b. 2011 s/p PCI/DES RCA.   CHF (congestive heart failure) (HCC)    Chronic kidney disease    COPD (chronic obstructive pulmonary disease) (HCC)    Depression    Diabetes mellitus without complication (HCC)    Diverticulitis    Dysrhythmia    GERD (gastroesophageal reflux disease)    Hyperlipidemia    Hypertension    Hypothyroid    pt unsure, not currently on medication   Ischemic cardiomyopathy    MI (myocardial infarction) ([place])    1995   Persistent atrial fibrillation ([place])    a. No OAC 2/2 h/o bleeding/anemia; b. Longterm amio therapy; c. 08/2019 - recurrent afib-->rate-controlled.   Presence of permanent cardiac pacemaker    Radiculopathy    Past Surgical History:  Procedure Laterality Date   ABDOMINAL  HYSTERECTOMY     APPENDECTOMY     AV NODE ABLATION N/A 12/13/2019   Procedure: AV NODE ABLATION;  Surgeon: Deboraha Sprang, MD;  Location: [facility];  Service: Cardiovascular;  Laterality: N/A;   CARDIAC CATHETERIZATION     COLONOSCOPY WITH PROPOFOL N/A 10/21/2019   Procedure: COLONOSCOPY WITH PROPOFOL;  Surgeon: Lin Landsman, MD;  Location: [facility] ENDOSCOPY;  Service: Gastroenterology;  Laterality: N/A;   CORONARY ANGIOPLASTY WITH STENT PLACEMENT  12/11/1993   RCA   ESOPHAGOGASTRODUODENOSCOPY (EGD) WITH PROPOFOL N/A 10/21/2019   Procedure: ESOPHAGOGASTRODUODENOSCOPY (EGD) WITH PROPOFOL;  Surgeon: Lin Landsman, MD;  Location: [facility];  Service: Gastroenterology;  Laterality: N/A;   FOOT SURGERY Bilateral    Over 25 years ago- Bunionectomy   GIVENS CAPSULE STUDY N/A 10/22/2019   Procedure: GIVENS CAPSULE STUDY;  Surgeon: Lin Landsman, MD;  Location: [facility] ENDOSCOPY;  Service: Gastroenterology;  Laterality: N/A;   INSERT / REPLACE / REMOVE PACEMAKER  09/2012   Model # D782  serial # [id number]   JOINT REPLACEMENT     bilateral knee replacements   PPM GENERATOR CHANGEOUT N/A 10/17/2021   Procedure: PPM GENERATOR CHANGEOUT;  Surgeon: Deboraha Sprang, MD;  Location: [facility];  Service: Cardiovascular;  Laterality: N/A;    Current Outpatient Medications  Medication Sig Dispense Refill   acetaminophen (TYLENOL) 650 MG CR tablet SMARTSIG:1 Tablet(s) By Mouth Twice Daily  antiseptic oral rinse (BIOTENE) LIQD 10 mLs by Mouth Rinse route 2 (two) times daily as needed for dry mouth.     aspirin EC 81 MG tablet Take 81 mg by mouth daily.     Blood Glucose Monitoring Suppl (ACCU-CHEK NANO SMARTVIEW) W/DEVICE KIT Please issue nano meter by AccuChek along with test strips at checking frequency of 2x daily. 90 day supply. E11.9 1 kit 1   cholecalciferol (VITAMIN D) 1000 units tablet Take 1,000 Units by mouth daily.     diclofenac Sodium (VOLTAREN) 1 % GEL Apply 1  application topically 4 (four) times daily as needed (left knee pain.).      famotidine (PEPCID) 20 MG tablet Take 20 mg by mouth daily.     FEROSUL 325 (65 Fe) MG tablet Take 325 mg by mouth daily with breakfast.     glucose blood (FREESTYLE LITE) test strip Use as instructed 200 each 3   Lancets (FREESTYLE) lancets Use as instructed 200 each 3   lidocaine (LIDODERM) 5 % Place 1 patch onto the skin daily as needed (knee pain.). Remove & Discard patch within 12 hours or as directed by MD     loratadine (CLARITIN) 10 MG tablet Take 10 mg by mouth daily.     Multiple Vitamins-Minerals (PRESERVISION AREDS 2+MULTI VIT) CAPS Take 1 capsule by mouth 2 (two) times daily.     naloxone (NARCAN) nasal spray 4 mg/0.1 mL Place 1 spray into the nose See admin instructions. Administer 1 spray in one nostril. Repeat every 3 minutes if no response.     nitroGLYCERIN (NITROSTAT) 0.4 MG SL tablet Place 0.4 mg under the tongue every 5 (five) minutes x 3 doses as needed for chest pain.      nystatin-triamcinolone (MYCOLOG II) cream Apply 1 application topically 3 (three) times daily as needed (affected area(s)).     pantoprazole (PROTONIX) 20 MG tablet Take 20 mg by mouth daily.     polyethylene glycol (MIRALAX / GLYCOLAX) 17 g packet Take 17 g by mouth daily as needed (constipation.).  (Patient not taking: Reported on 10/12/2021)     QUEtiapine (SEROQUEL) 25 MG tablet Take 25 mg by mouth at bedtime.      rosuvastatin (CRESTOR) 40 MG tablet Take 1 tablet (40 mg total) by mouth daily. 90 tablet 3   torsemide (DEMADEX) 20 MG tablet Take 1 tablet (20 mg total) by mouth daily as needed. Take 2 tablets (40 mg) by mouth once daily 30 tablet 0   traZODone (DESYREL) 100 MG tablet Take 100 mg by mouth at bedtime.     TUSSIN COUGH+CHEST CONGEST DM 10-100 MG/5ML liquid Take 10 mLs by mouth at bedtime as needed for cough.     vitamin B-12 1000 MCG tablet Take 1 tablet (1,000 mcg total) by mouth daily. 30 tablet 1   No current  facility-administered medications for this visit.    Allergies:   Penicillins, Other, Sulfa antibiotics, and Penicillin g   Social History: Social History   Socioeconomic History   Marital status: Widowed    Spouse name: Not on file   Number of children: Not on file   Years of education: Not on file   Highest education level: Not on file  Occupational History   Not on file  Tobacco Use   Smoking status: Former    Types: Cigarettes    Quit date: 12/10/1993    Years since quitting: 27.9   Smokeless tobacco: Former  Scientific laboratory technician Use:  Never used  Substance and Sexual Activity   Alcohol use: Yes    Alcohol/week: 0.0 standard drinks of alcohol    Comment: Rare   Drug use: No   Sexual activity: Not Currently  Other Topics Concern   Not on file  Social History Narrative   Moved from [place] with eldest daughter   7 children, 11 grandchildren, 40 GG children    Social Determinants of Radio broadcast assistant Strain: Not on file  Food Insecurity: Not on file  Transportation Needs: Not on file  Physical Activity: Not on file  Stress: Not on file  Social Connections: Not on file  Intimate Partner Violence: Not on file    Family History: Family History  Problem Relation Age of Onset   Arthritis Mother    Heart disease Mother    Hypertension Mother    Diabetes Mother    Arthritis Father    Heart disease Father    Hypertension Father    Cancer Sister        breast cancer   Diabetes Brother    Cancer Daughter        lung cancer   Diabetes Sister      Review of Systems: All other systems reviewed and are otherwise negative except as noted above.  Physical Exam: There were no vitals filed for this visit.   GEN- The patient is well appearing, alert and oriented x 3 today.   HEENT: normocephalic, atraumatic; sclera clear, conjunctiva pink; hearing intact; oropharynx clear; neck supple, no JVP Lymph- no cervical lymphadenopathy Lungs- Clear to  ausculation bilaterally, normal work of breathing.  No wheezes, rales, rhonchi Heart- {Blank single:19197::"Regular","Irregularly irregular"}  rate and rhythm, no murmurs, rubs or gallops, PMI not laterally displaced GI- soft, non-tender, non-distended, bowel sounds present, no hepatosplenomegaly Extremities- no clubbing or cyanosis. {EDEMA LKGMW:10272} peripheral edema; DP/PT/radial pulses 2+ bilaterally MS- no significant deformity or atrophy Skin- warm and dry, no rash or lesion; PPM pocket well healed Psych- euthymic mood, full affect Neuro- strength and sensation are intact  PPM Interrogation-  reviewed in detail today,  See PACEART report.  EKG:  EKG {ACTION; IS/IS ZDG:[id number]} ordered today. Personal review of ekg ordered {Blank single:19197::"today","***"} shows ***   Recent Labs: 04/26/2021: ALT 6 10/15/2021: TSH 0.461 10/16/2021: B Natriuretic Peptide 728.1 10/17/2021: Hemoglobin 9.3; Magnesium 1.7; Platelets 316 10/18/2021: BUN 26; Creatinine, Ser 1.42; Potassium 3.7; Sodium 136   Wt Readings from Last 3 Encounters:  10/17/21 148 lb 4.8 oz (67.3 kg)  04/26/21 156 lb 1.4 oz (70.8 kg)  12/05/20 158 lb (71.7 kg)     Other studies Reviewed: Additional studies/ records that were reviewed today include: Previous EP office notes, Previous remote checks, Most recent labwork.   Assessment and Plan:  1. CHB s/p [employer] PPM  Normal PPM function See Claudia Desanctis Art report No changes today  2. AF permanent 3. S/p AV nodal ablation Not on [place] with h/o GIB and NOT candidate to rechallenge  4. UTI / Sepsis syndrome Resolved ***  Current medicines are reviewed at length with the patient today.    Labs/ tests ordered today include: *** No orders of the defined types were placed in this encounter.    Disposition:   Follow up with {EPPROVIDERS:28135} in {Blank single:19197::"2 weeks","4 weeks","3 months","6 months","12 months","as usual post gen change"}    Signed, Shirley Friar, PA-C  11/19/2021 12:48 PM  [place]  [address] [phone] (office) [phone] (fax)

## 2021-11-21 ENCOUNTER — Encounter: Payer: Self-pay | Admitting: Student

## 2021-11-21 ENCOUNTER — Ambulatory Visit: Payer: Medicare (Managed Care) | Attending: Physician Assistant | Admitting: Student

## 2021-11-21 VITALS — BP 102/58 | HR 71 | Ht 59.0 in | Wt 155.4 lb

## 2021-11-21 DIAGNOSIS — I495 Sick sinus syndrome: Secondary | ICD-10-CM | POA: Diagnosis not present

## 2021-11-21 DIAGNOSIS — Z950 Presence of cardiac pacemaker: Secondary | ICD-10-CM

## 2021-11-21 DIAGNOSIS — I442 Atrioventricular block, complete: Secondary | ICD-10-CM

## 2021-11-21 LAB — CUP PACEART INCLINIC DEVICE CHECK
Date Time Interrogation Session: 20231011104520
Implantable Lead Implant Date: 20140813
Implantable Lead Implant Date: 20140813
Implantable Lead Location: 753859
Implantable Lead Location: 753860
Implantable Lead Model: 4469
Implantable Lead Model: 4470
Implantable Lead Serial Number: 581162
Implantable Lead Serial Number: 755425
Implantable Pulse Generator Implant Date: 20230906
Lead Channel Impedance Value: 349 Ohm
Lead Channel Impedance Value: 359 Ohm
Lead Channel Sensing Intrinsic Amplitude: 9.9 mV
Lead Channel Setting Pacing Amplitude: 1.2 V
Lead Channel Setting Pacing Pulse Width: 0.4 ms
Lead Channel Setting Sensing Sensitivity: 3 mV
Pulse Gen Serial Number: 651695

## 2021-11-21 NOTE — Patient Instructions (Signed)
Medication Instructions:  Your physician recommends that you continue on your current medications as directed. Please refer to the Current Medication list given to you today.  *If you need a refill on your cardiac medications before your next appointment, please call your pharmacy*   Lab Work: None If you have labs (blood work) drawn today and your tests are completely normal, you will receive your results only by: MyChart Message (if you have MyChart) OR A paper copy in the mail If you have any lab test that is abnormal or we need to change your treatment, we will call you to review the results.   Follow-Up: At [facility], you and your health needs are our priority.  As part of our continuing mission to provide you with exceptional heart care, we have created designated Provider Care Teams.  These Care Teams include your primary Cardiologist (physician) and Advanced Practice Providers (APPs -  Physician Assistants and Nurse Practitioners) who all work together to provide you with the care you need, when you need it.  We recommend signing up for the patient portal called "MyChart".  Sign up information is provided on this After Visit Summary.  MyChart is used to connect with patients for Virtual Visits (Telemedicine).  Patients are able to view lab/test results, encounter notes, upcoming appointments, etc.  Non-urgent messages can be sent to your provider as well.   To learn more about what you can do with MyChart, go to [email or web address].    Your next appointment:   As scheduled  Important Information About Sugar       

## 2021-12-23 ENCOUNTER — Encounter: Payer: Self-pay | Admitting: Medical Oncology

## 2021-12-23 ENCOUNTER — Emergency Department: Payer: Medicare (Managed Care)

## 2021-12-23 ENCOUNTER — Emergency Department
Admission: EM | Admit: 2021-12-23 | Discharge: 2021-12-23 | Disposition: A | Discharge status: Home / Self Care | Payer: Medicare (Managed Care) | Attending: Emergency Medicine | Admitting: Emergency Medicine

## 2021-12-23 DIAGNOSIS — I480 Paroxysmal atrial fibrillation: Secondary | ICD-10-CM | POA: Insufficient documentation

## 2021-12-23 DIAGNOSIS — J449 Chronic obstructive pulmonary disease, unspecified: Secondary | ICD-10-CM | POA: Diagnosis not present

## 2021-12-23 DIAGNOSIS — R079 Chest pain, unspecified: Secondary | ICD-10-CM | POA: Diagnosis present

## 2021-12-23 DIAGNOSIS — Z950 Presence of cardiac pacemaker: Secondary | ICD-10-CM | POA: Diagnosis not present

## 2021-12-23 DIAGNOSIS — R109 Unspecified abdominal pain: Secondary | ICD-10-CM | POA: Diagnosis not present

## 2021-12-23 DIAGNOSIS — I639 Cerebral infarction, unspecified: Secondary | ICD-10-CM | POA: Diagnosis not present

## 2021-12-23 LAB — URINALYSIS, ROUTINE W REFLEX MICROSCOPIC
Bilirubin Urine: NEGATIVE
Glucose, UA: NEGATIVE mg/dL
Hgb urine dipstick: NEGATIVE
Ketones, ur: NEGATIVE mg/dL
Leukocytes,Ua: NEGATIVE
Nitrite: NEGATIVE
Protein, ur: NEGATIVE mg/dL
Specific Gravity, Urine: 1.013 (ref 1.005–1.030)
pH: 5 (ref 5.0–8.0)

## 2021-12-23 LAB — HEPATIC FUNCTION PANEL
ALT: 8 U/L (ref 0–44)
AST: 16 U/L (ref 15–41)
Albumin: 3.4 g/dL — ABNORMAL LOW (ref 3.5–5.0)
Alkaline Phosphatase: 48 U/L (ref 38–126)
Bilirubin, Direct: 0.1 mg/dL (ref 0.0–0.2)
Indirect Bilirubin: 0.6 mg/dL (ref 0.3–0.9)
Total Bilirubin: 0.7 mg/dL (ref 0.3–1.2)
Total Protein: 6.2 g/dL — ABNORMAL LOW (ref 6.5–8.1)

## 2021-12-23 LAB — BASIC METABOLIC PANEL
Anion gap: 12 (ref 5–15)
BUN: 77 mg/dL — ABNORMAL HIGH (ref 8–23)
CO2: 27 mmol/L (ref 22–32)
Calcium: 10.8 mg/dL — ABNORMAL HIGH (ref 8.9–10.3)
Chloride: 96 mmol/L — ABNORMAL LOW (ref 98–111)
Creatinine, Ser: 1.57 mg/dL — ABNORMAL HIGH (ref 0.44–1.00)
GFR, Estimated: 31 mL/min — ABNORMAL LOW (ref >60)
Glucose, Bld: 115 mg/dL — ABNORMAL HIGH (ref 70–99)
Potassium: 3.1 mmol/L — ABNORMAL LOW (ref 3.5–5.1)
Sodium: 135 mmol/L (ref 135–145)

## 2021-12-23 LAB — TROPONIN I (HIGH SENSITIVITY)
Troponin I (High Sensitivity): 25 ng/L — ABNORMAL HIGH (ref <18)
Troponin I (High Sensitivity): 28 ng/L — ABNORMAL HIGH (ref <18)

## 2021-12-23 LAB — CBC
HCT: 27 % — ABNORMAL LOW (ref 36.0–46.0)
Hemoglobin: 8.4 g/dL — ABNORMAL LOW (ref 12.0–15.0)
MCH: 28.4 pg (ref 26.0–34.0)
MCHC: 31.1 g/dL (ref 30.0–36.0)
MCV: 91.2 fL (ref 80.0–100.0)
Platelets: 301 10*3/uL (ref 150–400)
RBC: 2.96 MIL/uL — ABNORMAL LOW (ref 3.87–5.11)
RDW: 15.5 % (ref 11.5–15.5)
WBC: 5.1 10*3/uL (ref 4.0–10.5)
nRBC: 0 % (ref 0.0–0.2)

## 2021-12-23 LAB — LIPASE, BLOOD: Lipase: 51 U/L (ref 11–51)

## 2021-12-23 MED ORDER — ONDANSETRON HCL 4 MG/2ML IJ SOLN
4.0000 mg | Freq: Once | INTRAMUSCULAR | Status: AC
  Administered 2021-12-23: 4 mg via INTRAVENOUS
  Filled 2021-12-23: qty 2

## 2021-12-23 MED ORDER — ACETAMINOPHEN 500 MG PO TABS
1000.0000 mg | ORAL_TABLET | Freq: Once | ORAL | Status: AC
  Administered 2021-12-23: 1000 mg via ORAL
  Filled 2021-12-23: qty 2

## 2021-12-23 MED ORDER — IOHEXOL 350 MG/ML SOLN
75.0000 mL | Freq: Once | INTRAVENOUS | Status: AC | PRN
  Administered 2021-12-23: 75 mL via INTRAVENOUS

## 2021-12-23 MED ORDER — POTASSIUM CHLORIDE CRYS ER 20 MEQ PO TBCR
40.0000 meq | EXTENDED_RELEASE_TABLET | Freq: Once | ORAL | Status: AC
  Administered 2021-12-23: 40 meq via ORAL
  Filled 2021-12-23: qty 2

## 2021-12-23 MED ORDER — AZITHROMYCIN 250 MG PO TABS: ORAL_TABLET | ORAL | 0 refills | Status: AC

## 2021-12-23 MED ORDER — SODIUM CHLORIDE 0.9 % IV BOLUS
500.0000 mL | Freq: Once | INTRAVENOUS | Status: AC
  Administered 2021-12-23: 500 mL via INTRAVENOUS

## 2021-12-23 MED ORDER — IPRATROPIUM-ALBUTEROL 0.5-2.5 (3) MG/3ML IN SOLN
3.0000 mL | Freq: Once | RESPIRATORY_TRACT | Status: AC
  Administered 2021-12-23: 3 mL via RESPIRATORY_TRACT
  Filled 2021-12-23: qty 3

## 2021-12-23 MED ORDER — CEFUROXIME AXETIL 500 MG PO TABS: 500.0000 mg | ORAL_TABLET | Freq: Two times a day (BID) | ORAL | 0 refills | Status: AC

## 2021-12-23 NOTE — ED Notes (Signed)
Pt assisted to the toilet and back to bed. Hooked  back up to the monitor, call bell within reach. No other needs at this time.

## 2021-12-23 NOTE — ED Triage Notes (Signed)
Pt from [facility] via ems with reports of central chest pressure that began this am. Pt denies pain upon arrival to ED was given 324mg  ASA, 1NTG and 1in Nitro paste PTA. Wears 3L [place] continuous.

## 2021-12-23 NOTE — Discharge Instructions (Addendum)
We are starting her on some antibiotics in case she could have a developing pneumonia although she overall is well-appearing with no fever and no white count.  I do not see any evidence of heart attack however she does have coronary disease and she could have a heart attack in the future therefore support that she follows up with her cardiologist to further discuss.  If she develops return of chest pain she can return to the ER for repeat evaluation.  She can also follow-up as below for repeat CT imaging as patient would like.  Her kidney function was elevated and her BUN was elevated which can be a sign of dehydration. And if it gets more elevated it can cause some confusion. I have let the       IMPRESSION: 1. Negative examination for pulmonary embolism. 2. Dependent subpleural nodule or consolidation of the superior segment left lower lobe measuring 2.2 x 1.9 cm. Subsolid nodular opacity of the dependent right lower lobe measuring 0.8 x 0.7 cm. These are nonspecific and may be infectious or inflammatory, although malignancy is not excluded. Non-contrast chest CT at 3-6 months is recommended if clinically appropriate. If the nodules are stable at time of repeat CT, then future CT at 18-24 months (from today's scan) is considered optional for low-risk patients, but is recommended for high-risk patients. This recommendation follows the consensus statement: Guidelines for Management of Incidental Pulmonary Nodules Detected on CT Images: From the Fleischner Society 2017; Radiology 2017; 284:228-243. 3. Emphysema and diffuse bilateral bronchial wall thickening. 4. Gross cardiomegaly and coronary artery disease.

## 2021-12-23 NOTE — ED Provider Notes (Addendum)
[facility] Provider Note    Event Date/Time   First MD Initiated Contact with Patient 12/23/21 [phone]     (approximate)   History   Chest Pain   HPI  Evelyn Simmons is a 88 y.o. female who comes in from [facility] via EMS for centralized chest pressure that began this morning.  Patient does wear 3 L of oxygen at baseline.  Patient was given 324 aspirin, 1 nitroglycerin and 1 inch of Nitropaste prior to arrival.  Patient reports being unclear exactly what time for chest discomfort started at.  She states that it was just prior to calling EMS though.  She reports that it was a stabbing sensation in the front of her chest.  She denies any radiation down the arm, numbness or tingling.  She reports that her pain is since now mostly relieved.  She reports is very minimal.  Denies any falls or hitting her head recently no shortness of breath she is on chronic oxygen for known COPD.  Denies any new abdominal discomfort.  She does report having a pacemaker and she denies any LOC.   I reviewed patient's discharge summary from 9/7 when he has ischemic cardiomyopathy of 45%, paroxysmal A-fib, post pacemaker, COPD patient was also treated for UTI   Physical Exam   Triage Vital Signs: ED Triage Vitals  Enc Vitals Group     BP 12/23/21 0714 (!) 149/58     Pulse Rate 12/23/21 0714 77     Resp 12/23/21 0714 20     Temp 12/23/21 0714 97.8 F (36.6 C)     Temp Source 12/23/21 0714 Oral     SpO2 12/23/21 0714 94 %     Weight 12/23/21 0715 154 lb 5.2 oz (70 kg)     Height 12/23/21 0715 4\' 11"  (1.499 m)     Head Circumference --      Peak Flow --      Pain Score 12/23/21 0715 0     Pain Loc --      Pain Edu? --      Excl. in [place]? --     Most recent vital signs: Vitals:   12/23/21 0714  BP: (!) 149/58  Pulse: 77  Resp: 20  Temp: 97.8 F (36.6 C)  SpO2: 94%     General: Awake, no distress.  CV:  Good peripheral perfusion.  No chest wall  tenderness Resp:  Normal effort.  Abd:  No distention.  No abdominal tenderness Other:  Equal radial pulses.  Equal equal pedal pulses.   ED Results / Procedures / Treatments   Labs (all labs ordered are listed, but only abnormal results are displayed) Labs Reviewed  BASIC METABOLIC PANEL - Abnormal; Notable for the following components:      Result Value   Potassium 3.1 (*)    Chloride 96 (*)    Glucose, Bld 115 (*)    BUN 77 (*)    Creatinine, Ser 1.57 (*)    Calcium 10.8 (*)    GFR, Estimated 31 (*)    All other components within normal limits  CBC - Abnormal; Notable for the following components:   RBC 2.96 (*)    Hemoglobin 8.4 (*)    HCT 27.0 (*)    All other components within normal limits  TROPONIN I (HIGH SENSITIVITY) - Abnormal; Notable for the following components:   Troponin I (High Sensitivity) 25 (*)    All other components within normal limits  EKG  My interpretation of EKG:  Sinus rate of 70 without any ST elevation or T wave inversions with a widened QRS no obvious Sgar[place]ossa criteria.  RADIOLOGY I have reviewed the xray personally and interpreted per patient has stable cardiomegaly without any evidence of edema   PROCEDURES:  Critical Care performed: No  .1-3 Lead EKG Interpretation  Performed by: Vanessa McGrath, MD Authorized by: Vanessa [place], MD     Interpretation: normal     ECG rate:  70   ECG rate assessment: normal     Rhythm: paced     Ectopy: none     Conduction: normal      MEDICATIONS ORDERED IN ED: Medications  acetaminophen (TYLENOL) tablet 1,000 mg (has no administration in time range)     IMPRESSION / MDM / ASSESSMENT AND PLAN / ED COURSE  I reviewed the triage vital signs and the nursing notes.   Patient's presentation is most consistent with acute presentation with potential threat to life or bodily function.   Patient comes in with concerns for some stabbing chest pain in her chest.  Patient has a lot of risk  factors for cardiac disease therefore EKG cardiac markers were ordered.  Chest x-ray ordered evaluate for any widened mediastinum although she has no numbness or tingling, and has good equal pulses throughout so lower suspicion for dissection.  She denies no new shortness of breath to suggest PE.  She does have a history of CHF but she states that diuretics for but denies any worsening swelling or shortness of breath  Trop was 25-around her baseline  BMP CR around baseline  BUN is up--CA around baseline Potassium low CVC shows hemoglobin around baseline   10:14 AM  Discussed with [employer] and patient has normal leads and no events over the past 72 hours  11:01 AM on repeat evaluation patient reports feeling worse.  She reports pain in her back as well as pain with breathing and worsening shortness of breath as well as having a headache as well as having abdominal pain and feeling like she has been going to be nauseous and threw up  IMPRESSION:  1. No acute CT findings of the abdomen or pelvis to explain  abdominal pain.  2. Severe sigmoid diverticulosis without evidence of acute  diverticulitis.  3. Cholelithiasis without evidence of acute cholecystitis.  4. Nonobstructive left nephrolithiasis.    IMPRESSION: 1. Negative examination for pulmonary embolism. 2. Dependent subpleural nodule or consolidation of the superior segment left lower lobe measuring 2.2 x 1.9 cm. Subsolid nodular opacity of the dependent right lower lobe measuring 0.8 x 0.7 cm. These are nonspecific and may be infectious or inflammatory, although malignancy is not excluded. Non-contrast chest CT at 3-6 months is recommended if clinically appropriate. If the nodules are stable at time of repeat CT, then future CT at 18-24 months (from today's scan) is considered optional for low-risk patients, but is recommended for high-risk patients. This recommendation follows the consensus statement: Guidelines for  Management of Incidental Pulmonary Nodules Detected on CT Images: From the Fleischner Society 2017; Radiology 2017; 284:228-243. 3. Emphysema and diffuse bilateral bronchial wall thickening. 4. Gross cardiomegaly and coronary artery disease.   IMPRESSION: No acute intracranial pathology. Small-vessel white matter disease.    I discussed with patient the incidental finding on CT scan.  She expressed understanding.  She is currently having any cough or infectious symptoms given she does have COPD she did report   Patient on  repeat evaluation reports resolution of symptoms reports feeling much better.  Patient would not significant work-up unless necessary we discussed admission but given her cardiac markers are stable she feels comfortable with discharge home.  Given a little bit of the shortness of breath with possible infectious cause she would like to think antibiotics outpatient which I think is just a little cautious my suspicion for pneumonia at this time is pretty low.  We will hold off on doing levo due to the risk for C. difficile and trialing her Doxy and azithromycin due to penicillin allergy.  Able to talk with the [facility] team and  update them on treated for possible pneumonia although my suspicion overall is low as well as her elevated BUN but patient also got fluids here.  They expressed understanding and will check in tomorrow.   The patient is on the cardiac monitor to evaluate for evidence of arrhythmia and/or significant heart rate changes.      FINAL CLINICAL IMPRESSION(S) / ED DIAGNOSES   Final diagnoses:  Chronic obstructive pulmonary disease, unspecified COPD type ([place])  Chest pain, unspecified type     Rx / DC Orders   ED Discharge Orders          Ordered    cefUROXime (CEFTIN) 500 MG tablet  2 times daily with meals        12/23/21 1301    azithromycin (ZITHROMAX Z-PAK) 250 MG tablet        12/23/21 1301             Note:  This document was prepared  using Dragon voice recognition software and may include unintentional dictation errors.     Vanessa Riner, MD 12/23/21 [phone]

## 2021-12-23 NOTE — ED Notes (Signed)
Insurance risk surveyor used to successfully interrogate pts pacemaker.

## 2022-01-22 ENCOUNTER — Encounter: Payer: Medicare (Managed Care) | Admitting: Internal Medicine

## 2022-01-22 DIAGNOSIS — I4729 Other ventricular tachycardia: Secondary | ICD-10-CM | POA: Insufficient documentation

## 2022-04-23 ENCOUNTER — Other Ambulatory Visit
Admission: RE | Admit: 2022-04-23 | Discharge: 2022-04-23 | Disposition: A | Discharge status: Home / Self Care | Payer: Medicare (Managed Care) | Attending: Internal Medicine | Admitting: Internal Medicine

## 2022-04-23 ENCOUNTER — Ambulatory Visit: Payer: Medicare (Managed Care) | Attending: Internal Medicine | Admitting: Internal Medicine

## 2022-04-23 ENCOUNTER — Encounter: Payer: Self-pay | Admitting: Internal Medicine

## 2022-04-23 VITALS — BP 122/62 | HR 71 | Ht 61.0 in | Wt 145.2 lb

## 2022-04-23 DIAGNOSIS — E876 Hypokalemia: Secondary | ICD-10-CM | POA: Insufficient documentation

## 2022-04-23 DIAGNOSIS — I4819 Other persistent atrial fibrillation: Secondary | ICD-10-CM

## 2022-04-23 DIAGNOSIS — Z950 Presence of cardiac pacemaker: Secondary | ICD-10-CM | POA: Diagnosis not present

## 2022-04-23 DIAGNOSIS — D649 Anemia, unspecified: Secondary | ICD-10-CM | POA: Diagnosis present

## 2022-04-23 DIAGNOSIS — I442 Atrioventricular block, complete: Secondary | ICD-10-CM

## 2022-04-23 DIAGNOSIS — I4729 Other ventricular tachycardia: Secondary | ICD-10-CM

## 2022-04-23 LAB — CBC
HCT: 27.4 % — ABNORMAL LOW (ref 36.0–46.0)
Hemoglobin: 7.7 g/dL — ABNORMAL LOW (ref 12.0–15.0)
MCH: 25.6 pg — ABNORMAL LOW (ref 26.0–34.0)
MCHC: 28.1 g/dL — ABNORMAL LOW (ref 30.0–36.0)
MCV: 91 fL (ref 80.0–100.0)
Platelets: 235 10*3/uL (ref 150–400)
RBC: 3.01 MIL/uL — ABNORMAL LOW (ref 3.87–5.11)
RDW: 17.5 % — ABNORMAL HIGH (ref 11.5–15.5)
WBC: 4.8 10*3/uL (ref 4.0–10.5)
nRBC: 0 % (ref 0.0–0.2)

## 2022-04-23 LAB — BASIC METABOLIC PANEL
Anion gap: 10 (ref 5–15)
BUN: 52 mg/dL — ABNORMAL HIGH (ref 8–23)
CO2: 32 mmol/L (ref 22–32)
Calcium: 10.7 mg/dL — ABNORMAL HIGH (ref 8.9–10.3)
Chloride: 99 mmol/L (ref 98–111)
Creatinine, Ser: 1.27 mg/dL — ABNORMAL HIGH (ref 0.44–1.00)
GFR, Estimated: 40 mL/min — ABNORMAL LOW (ref >60)
Glucose, Bld: 104 mg/dL — ABNORMAL HIGH (ref 70–99)
Potassium: 3.9 mmol/L (ref 3.5–5.1)
Sodium: 141 mmol/L (ref 135–145)

## 2022-04-23 LAB — PACEMAKER DEVICE OBSERVATION

## 2022-04-23 NOTE — Patient Instructions (Addendum)
Medication Instructions:  Your physician recommends that you continue on your current medications as directed. Please refer to the Current Medication list given to you today.  *If you need a refill on your cardiac medications before your next appointment, please call your pharmacy*   Lab Work: CBC and BMET today  If you have labs (blood work) drawn today and your tests are completely normal, you will receive your results only by: MyChart Message (if you have MyChart) OR A paper copy in the mail If you have any lab test that is abnormal or we need to change your treatment, we will call you to review the results.   Testing/Procedures: None ordered.    Follow-Up: At [facility], you and your health needs are our priority.  As part of our continuing mission to provide you with exceptional heart care, we have created designated Provider Care Teams.  These Care Teams include your primary Cardiologist (physician) and Advanced Practice Providers (APPs -  Physician Assistants and Nurse Practitioners) who all work together to provide you with the care you need, when you need it.  We recommend signing up for the patient portal called "MyChart".  Sign up information is provided on this After Visit Summary.  MyChart is used to connect with patients for Virtual Visits (Telemedicine).  Patients are able to view lab/test results, encounter notes, upcoming appointments, etc.  Non-urgent messages can be sent to your provider as well.   To learn more about what you can do with MyChart, go to [email or web address].    Your next appointment:   12 months with Dr Klein 

## 2022-04-23 NOTE — Progress Notes (Signed)
Patient ID: Evelyn Simmons, female   DOB: December 08, 1929, 89 y.o.   MRN: [id number]      Patient Care Team: Dionicia Abler, NP (Inactive) as PCP - General Deboraha Sprang, MD as PCP - Cardiology (Cardiology) Deboraha Sprang, MD as PCP - Electrophysiology (Cardiology)   HPI  Evelyn Simmons is a 89 y.o. female seen in followup for  pacemaker implanted 2014  for sick sinus syndrome. Corporate investment banker).  Developed persistent atrial fibrillation prompting 2 hospitalizations for acute on chronic heart failure related to rapid rates.  With history of recurrent transfusion requiring GI bleeding, not felt to be a candidate for anticoagulation.  Underwent AV junction ablation 11/21   Hospitalized 9/23 following a fall hypotension and shock.  Device had reached ERI and underwent device generator replacement.   Coronary artery disease with stenting in 1995. catheterization 12/11-- high-grade stenosis of her RCA >>stented.     Continues with severe dyspnea.  On oxygen supplementation which she uses variably No chest pain.  Some edema. Ambulates  She is in remains profoundly anemic a problem that is new again since 3/23.  Iron studies 9/23 were unrevealing     Date Cr K TSH LFTs Hgb  3/16  1.13 4.5 2.75 12   2/17       9.3  7/19 1.1 4.0 1.45 19 10.9  9/21 1.61 3.8   8.4<7.4  11/21 2.17 3.8<<2.8     12/21 1.47      11/23 1.57 3.1   8.4   DATE TEST EF   12/11 LHC  RCA stented   12/13 TEE 45%   8/17 Echo   >55 % AS mild (mean Grad 13)  8/21 Echo  45-50% LAE-severe  AS mild  (mG-46m)           Past Medical History:  Diagnosis Date   (HFmrEF) heart failure with mid-range ejection fraction ([facility]    a. 09/2019 Echo: EF 45-50%, glob HK, mild conc LVH, mildly elev PASP. Sev dil LA. Mildly dil RA. Mild MR.   Anemia    Arthritis    Asthma    CAD (coronary artery disease)    a. 1995 s/p PCI/BMS LAD; b. 2011 s/p PCI/DES RCA.   CHF (congestive heart failure) (HCC)    Chronic kidney disease    COPD  (chronic obstructive pulmonary disease) (HCC)    Depression    Diabetes mellitus without complication (HCC)    Diverticulitis    Dysrhythmia    GERD (gastroesophageal reflux disease)    Hyperlipidemia    Hypertension    Hypothyroid    pt unsure, not currently on medication   Ischemic cardiomyopathy    MI (myocardial infarction) ([facility]    1995   Persistent atrial fibrillation ([facility]    a. No OAC 2/2 h/o bleeding/anemia; b. Longterm amio therapy; c. 08/2019 - recurrent afib-->rate-controlled.   Presence of permanent cardiac pacemaker    Radiculopathy     Past Surgical History:  Procedure Laterality Date   ABDOMINAL HYSTERECTOMY     APPENDECTOMY     AV NODE ABLATION N/A 12/13/2019   Procedure: AV NODE ABLATION;  Surgeon: KDeboraha Sprang MD;  [facility];  Service: Cardiovascular;  Laterality: N/A;   CARDIAC CATHETERIZATION     COLONOSCOPY WITH PROPOFOL N/A 10/21/2019   Procedure: COLONOSCOPY WITH PROPOFOL;  Surgeon: VLin Landsman MD;  [facility];  Service: Gastroenterology;  Laterality: N/A;   CORONARY ANGIOPLASTY WITH STENT PLACEMENT  12/11/1993  RCA   ESOPHAGOGASTRODUODENOSCOPY (EGD) WITH PROPOFOL N/A 10/21/2019   Procedure: ESOPHAGOGASTRODUODENOSCOPY (EGD) WITH PROPOFOL;  Surgeon: Lin Landsman, MD;  Location: [place];  Service: Gastroenterology;  Laterality: N/A;   FOOT SURGERY Bilateral    Over 25 years ago- Bunionectomy   GIVENS CAPSULE STUDY N/A 10/22/2019   Procedure: GIVENS CAPSULE STUDY;  Surgeon: Lin Landsman, MD;  Location: [facility];  Service: Gastroenterology;  Laterality: N/A;   INSERT / REPLACE / REMOVE PACEMAKER  09/2012   Model # [id number]  serial # [id number]   JOINT REPLACEMENT     bilateral knee replacements   PPM GENERATOR CHANGEOUT N/A 10/17/2021   Procedure: PPM GENERATOR CHANGEOUT;  Surgeon: Deboraha Sprang, MD;  Location: [place] [facility];  Service: Cardiovascular;  Laterality: N/A;    Current Outpatient  Medications  Medication Sig Dispense Refill   acetaminophen (TYLENOL) 650 MG CR tablet SMARTSIG:1 Tablet(s) By Mouth Twice Daily     antiseptic oral rinse (BIOTENE) LIQD 10 mLs by Mouth Rinse route 2 (two) times daily as needed for dry mouth.     aspirin EC 81 MG tablet Take 81 mg by mouth daily.     Blood Glucose Monitoring Suppl (ACCU-CHEK NANO SMARTVIEW) W/DEVICE KIT Please issue nano meter by AccuChek along with test strips at checking frequency of 2x daily. 90 day supply. E11.9 1 kit 1   cholecalciferol (VITAMIN D) 1000 units tablet Take 1,000 Units by mouth daily.     diclofenac Sodium (VOLTAREN) 1 % GEL Apply 1 application topically 4 (four) times daily as needed (left knee pain.).      famotidine (PEPCID) 20 MG tablet Take 20 mg by mouth daily.     FEROSUL 325 (65 Fe) MG tablet Take 325 mg by mouth daily with breakfast.     furosemide (LASIX) 10 MG/ML injection Inject 40 mg into the muscle once.     glucose blood (FREESTYLE LITE) test strip Use as instructed 200 each 3   ipratropium-albuterol (DUONEB) 0.5-2.5 (3) MG/3ML SOLN Take by nebulization 2 (two) times daily.     Lancets (FREESTYLE) lancets Use as instructed 200 each 3   lidocaine (LIDODERM) 5 % Place 1 patch onto the skin daily as needed (knee pain.). Remove & Discard patch within 12 hours or as directed by MD     loratadine (CLARITIN) 10 MG tablet Take 10 mg by mouth daily.     metolazone (ZAROXOLYN) 5 MG tablet Take 1 tablet by mouth 3 (three) times a week.     Multiple Vitamins-Minerals (PRESERVISION AREDS 2+MULTI VIT) CAPS Take 1 capsule by mouth 2 (two) times daily.     naloxone (NARCAN) nasal spray 4 mg/0.1 mL Place 1 spray into the nose See admin instructions. Administer 1 spray in one nostril. Repeat every 3 minutes if no response.     nitroGLYCERIN (NITROSTAT) 0.4 MG SL tablet Place 0.4 mg under the tongue every 5 (five) minutes x 3 doses as needed for chest pain.      nystatin-triamcinolone (MYCOLOG II) cream Apply 1  application topically 3 (three) times daily as needed (affected area(s)).     ondansetron (ZOFRAN) 4 MG tablet Take 4 mg by mouth every 8 (eight) hours as needed.     pantoprazole (PROTONIX) 20 MG tablet Take 20 mg by mouth daily.     polyethylene glycol (MIRALAX / GLYCOLAX) 17 g packet Take 17 g by mouth daily as needed (constipation.).     potassium chloride SA (KLOR-CON M) 20 MEQ  tablet Take 20 mEq by mouth daily.     QUEtiapine (SEROQUEL) 25 MG tablet Take 25 mg by mouth at bedtime.      rosuvastatin (CRESTOR) 40 MG tablet Take 1 tablet (40 mg total) by mouth daily. 90 tablet 3   sertraline (ZOLOFT) 50 MG tablet Take 50 mg by mouth daily.     torsemide (DEMADEX) 20 MG tablet Take 1 tablet (20 mg total) by mouth daily as needed. Take 2 tablets (40 mg) by mouth once daily 30 tablet 0   traMADol (ULTRAM-ER) 100 MG 24 hr tablet Take 100 mg by mouth daily.     traZODone (DESYREL) 100 MG tablet Take 100 mg by mouth at bedtime.     TUSSIN COUGH+CHEST CONGEST DM 10-100 MG/5ML liquid Take 10 mLs by mouth at bedtime as needed for cough.     vitamin B-12 1000 MCG tablet Take 1 tablet (1,000 mcg total) by mouth daily. 30 tablet 1   No current facility-administered medications for this visit.    Allergies  Allergen Reactions   Penicillins Nausea And Vomiting, Rash and Hives   Other Other (See Comments)   Sulfa Antibiotics     Pt unsure reaction, was told in hospital she had allergy   Penicillin G Rash    Review of Systems negative except from HPI and PMH  Physical Exam BP 122/62 (BP Location: Left Arm, Patient Position: Sitting, Cuff Size: Normal)   Pulse 71   Ht '5\' 1"'$  (1.549 m)   Wt 145 lb 3.2 oz (65.9 kg)   SpO2 100% Comment: 2 LITERS  BMI 27.44 kg/m  Well developed and well nourished in no acute distress HENT normal Neck supple with JVP-flat Decreased breath sounds Device pocket well healed; without hematoma or erythema.  There is no tethering  Regular rate and rhythm,  murmur Abd-soft with active BS No Clubbing cyanosis  edema Skin-warm and dry A & Oriented  Grossly normal sensory and motor function  ECG ventricular pacing at 70 with underlying atrial fibrillation  Device function is normal. Programming changes rate response was made more aggressive including changing the threshold from medium to low ' Accelerometer response factor was increased from 4--6 and the ventilation response factor for 6--8 see Paceart for details    Assessment and  Plan  Complete heart block s/p AV ablation  Pacemaker-[place]  Atrial fibrillation-persistent>> permanent   Hypertension   Aortic stenosis  Ischemic heart disease with prior stenting  HFpEF  GI bleeding precluding anticoagulation   Anemia recurrent  Hypokalemia  Renal insufficiency  chronic  VTNS    Functional status remains quite limited.  We have reprogrammed her pacemaker as she had no heartbeats essentially at all and above the been 70--80.  I am also impressed at her anemia in the context of her lung disease.  She is not iron deficient, we will recheck it today, and if it remains low, would recommend to the primary care team to consider hematology evaluation.  Per GFR has been low but I do not know if it is low enough to explain the anemia especially as her hemoglobin just 1 year ago was over 11  Her last potassium was low.  3.1, we will recheck it.  Blood pressure is well-controlled.  He is relatively euvolemic.  She remains on Zaroxolyn 5 3 days a week Demadex 40 daily  Previous use of apixaban was discontinued because of anemia.  This should be revisited and we will do so following the return of  her repeat hemoglobin

## 2022-04-26 ENCOUNTER — Telehealth: Payer: Self-pay | Admitting: Internal Medicine

## 2022-04-26 NOTE — Telephone Encounter (Signed)
Deboraha Sprang, MD 04/24/2022 11:01 AM EDT     Please Inform Patient that Hgb  is VERY abnormal and will require further eval.  It has been low for some time but it should be sorted out-- she should go to her PCP soon   Thanks

## 2022-04-26 NOTE — Telephone Encounter (Signed)
Thora Lance, RN 04/24/2022  4:58 PM EDT     Attempted phone call to pt and pt's daughter,Polly, DPR.  Left voicemail messages at both numbers to contact RN at [phone] re: lab results.

## 2022-04-26 NOTE — Telephone Encounter (Signed)
Results faxed to Georgeanne Nim, NP at [phone] via [employer] fax function.

## 2022-04-26 NOTE — Telephone Encounter (Signed)
Attempted to call the patient- no answer. I left a message to please call back.   Attempted to call [facility] at [phone]. I was notified the patient comes to [facility] on Mondays/ Tuesdays/ Thursdays/ & Fridays. Her new provider is [facility]. Results can be faxed to Olivia Lopez de Gutierrez at [phone].

## 2022-04-29 ENCOUNTER — Encounter: Payer: Self-pay | Admitting: Internal Medicine

## 2022-05-02 NOTE — Telephone Encounter (Signed)
I called [facility] clinic today to confirm that Evelyn Nim, NP had received the patient's Hgb last week that we faxed over. Per Clinic staff, Magda Paganini confirms she did receive these results.

## 2022-05-21 ENCOUNTER — Ambulatory Visit (INDEPENDENT_AMBULATORY_CARE_PROVIDER_SITE_OTHER): Payer: Medicare (Managed Care)

## 2022-05-21 DIAGNOSIS — I442 Atrioventricular block, complete: Secondary | ICD-10-CM

## 2022-05-21 LAB — CUP PACEART REMOTE DEVICE CHECK
Battery Remaining Longevity: 102 mo
Battery Remaining Percentage: 100 %
Brady Statistic RA Percent Paced: 0 %
Brady Statistic RV Percent Paced: 97 %
Date Time Interrogation Session: 20240409124100
Implantable Lead Connection Status: 753985
Implantable Lead Connection Status: 753985
Implantable Lead Implant Date: 20140813
Implantable Lead Implant Date: 20140813
Implantable Lead Location: 753859
Implantable Lead Location: 753860
Implantable Lead Model: 4469
Implantable Lead Model: 4470
Implantable Lead Serial Number: 581162
Implantable Lead Serial Number: 755425
Implantable Pulse Generator Implant Date: 20230906
Lead Channel Impedance Value: 353 Ohm
Lead Channel Pacing Threshold Amplitude: 1 V
Lead Channel Pacing Threshold Pulse Width: 0.4 ms
Lead Channel Setting Pacing Amplitude: 1.5 V
Lead Channel Setting Pacing Pulse Width: 0.4 ms
Lead Channel Setting Sensing Sensitivity: 3 mV
Pulse Gen Serial Number: 651695
Zone Setting Status: 755011

## 2022-06-28 NOTE — Progress Notes (Signed)
Remote pacemaker transmission.   

## 2022-08-20 IMAGING — CR DG CHEST 2V
1 series · 2 of 2 positions shown · non-contrast
Comparison: Chest x-ray 09/17/2019.

CLINICAL DATA: 82-year-old female with history of shortness of
breath.

EXAM:
CHEST - 2 VIEW

[Series 1: dg chest 2 view · 0.14mm/px · 2 of 2 slices shown]
[im 1/2]
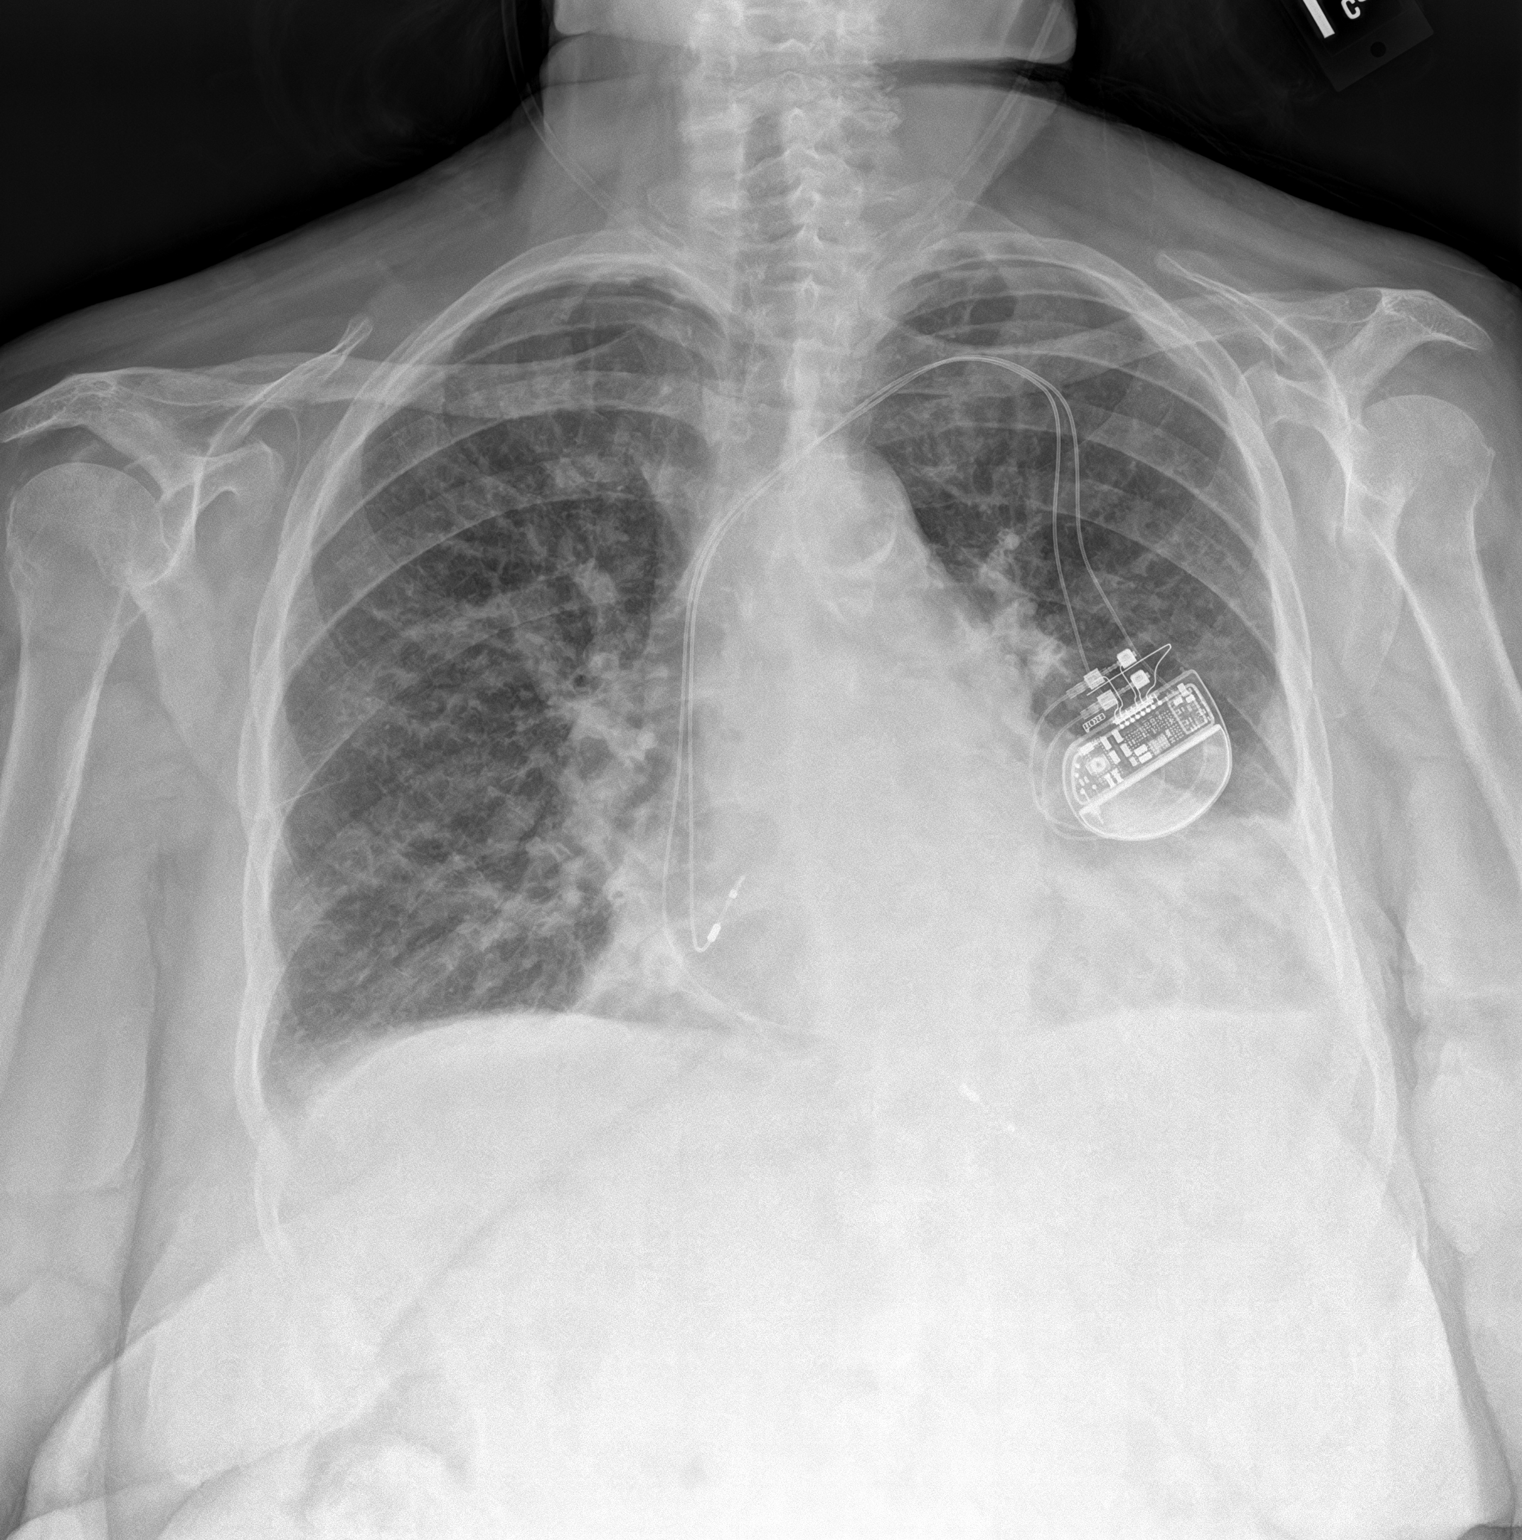
[im 2/2]
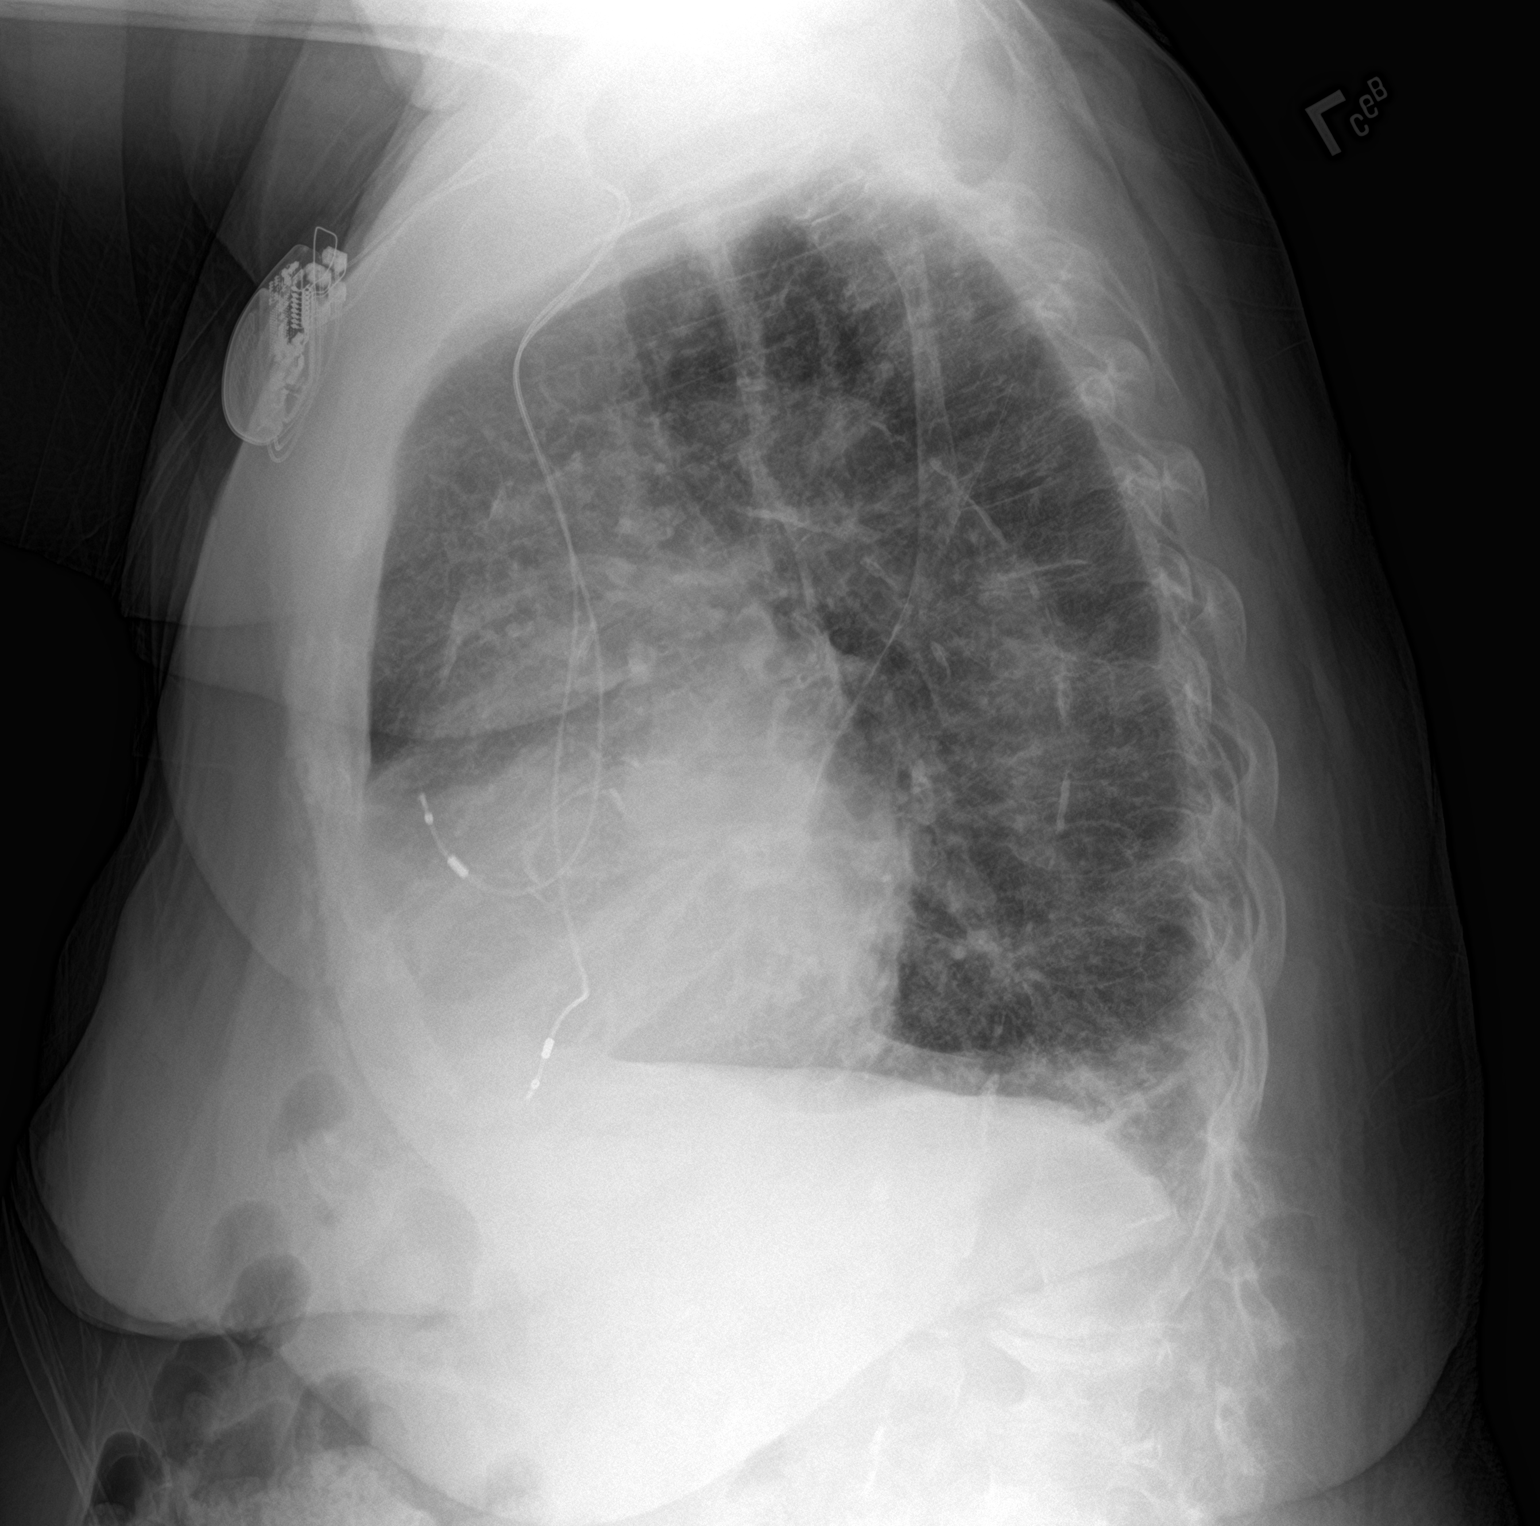

[2 of 2 positions shown; findings below may reference images not displayed]

FINDINGS: Lung volumes are decreased. There is cephalization of the pulmonary
vasculature and slight indistinctness of the interstitial markings
suggestive of mild pulmonary edema. Trace left pleural effusion. No
right pleural effusion. Heart size is mildly enlarged. Upper
mediastinal contours are within normal limits. Aortic
atherosclerosis. Left-sided pacemaker device in place with lead tips
projecting over the expected location of the right atrium and right
ventricle.
IMPRESSION: 1. The appearance of the chest suggests mild congestive heart
failure, as above.
2. Aortic atherosclerosis.

## 2022-08-22 IMAGING — CR DG CHEST 2V
1 series · 2 of 2 positions shown · non-contrast
Comparison: October 02, 2019

CLINICAL DATA: Shortness of breath with chest congestion

EXAM:
CHEST - 2 VIEW

[Series 1: dg chest 2 view · 0.14mm/px · 2 of 2 slices shown]
[im 1/2]
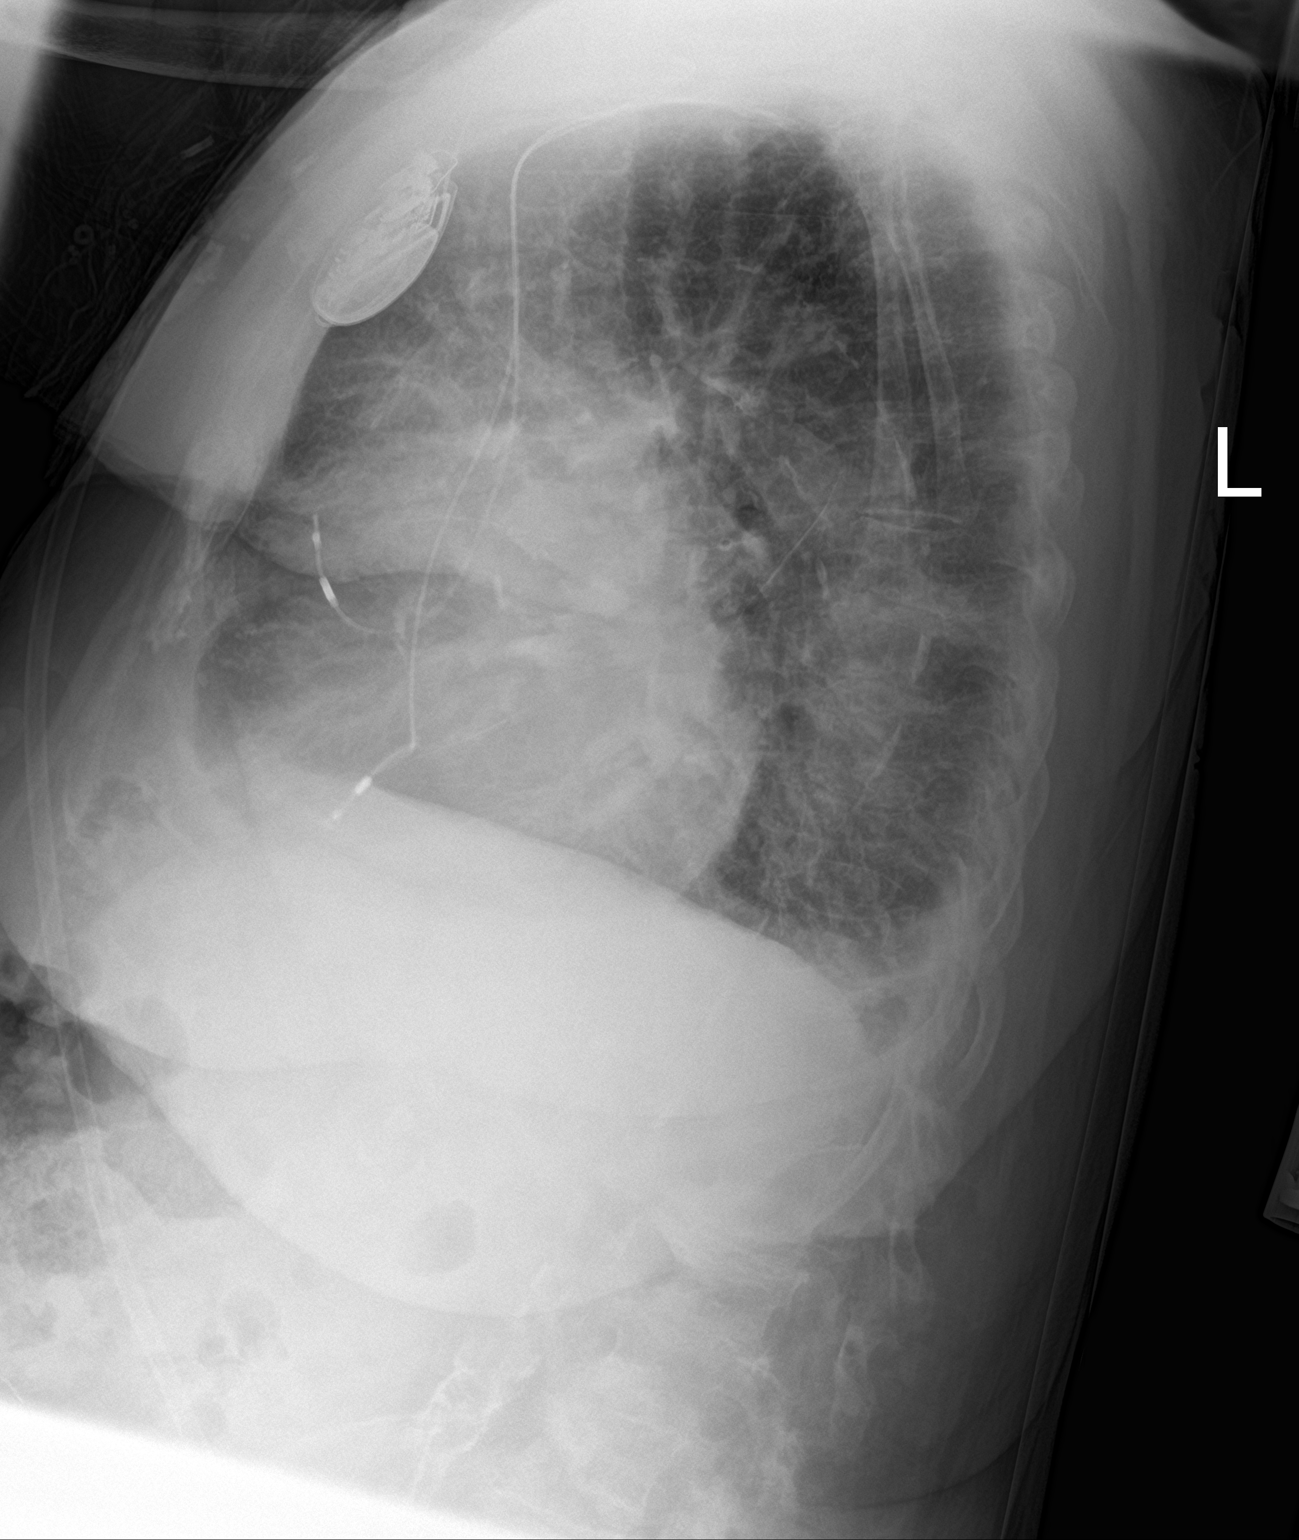
[im 2/2]
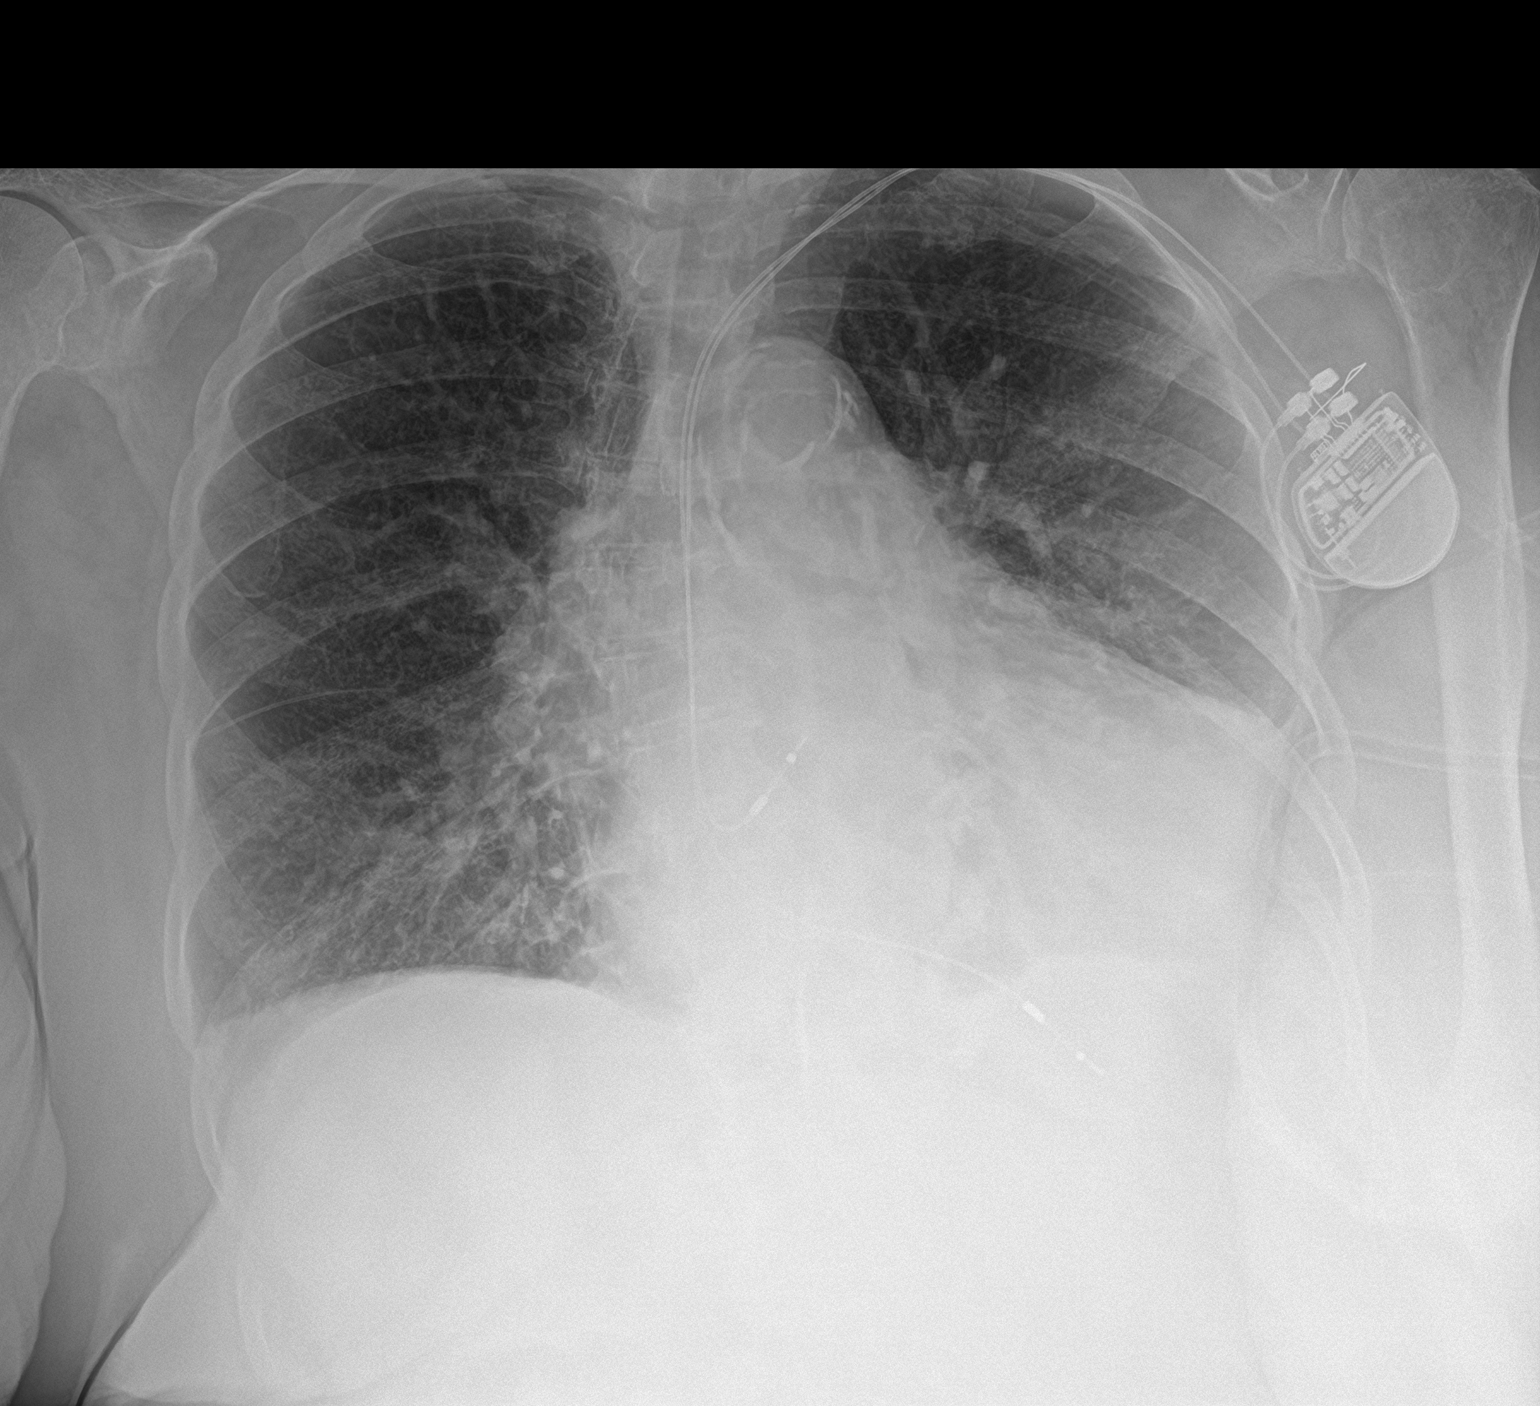

[2 of 2 positions shown; findings below may reference images not displayed]

FINDINGS: There is ill-defined airspace opacity in the left lower lung region.
There is a minimal pleural effusion on each side. No appreciable
interstitial edema. There is cardiomegaly with mild pulmonary venous
hypertension. Pacemaker leads are attached to the right atrium and
right ventricle. There is aortic atherosclerosis. No adenopathy. No
bone lesions.
IMPRESSION: Persistent cardiomegaly with pulmonary vascular congestion. Trace
pleural effusions bilaterally. There may be a degree of underlying
congestive heart failure.

There is airspace opacity in the left lower lung region concerning
for superimposed developing pneumonia.

Pacemaker leads attached to right atrium and right ventricle.

Aortic Atherosclerosis ([id number]).

## 2022-09-03 ENCOUNTER — Ambulatory Visit: Payer: Medicare (Managed Care)

## 2022-09-03 DIAGNOSIS — I442 Atrioventricular block, complete: Secondary | ICD-10-CM

## 2022-09-04 LAB — CUP PACEART REMOTE DEVICE CHECK
Battery Remaining Longevity: 96 mo
Battery Remaining Percentage: 100 %
Brady Statistic RA Percent Paced: 0 %
Brady Statistic RV Percent Paced: 94 %
Date Time Interrogation Session: 20240723115300
Implantable Lead Connection Status: 753985
Implantable Lead Connection Status: 753985
Implantable Lead Implant Date: 20140813
Implantable Lead Implant Date: 20140813
Implantable Lead Location: 753859
Implantable Lead Location: 753860
Implantable Lead Model: 4469
Implantable Lead Model: 4470
Implantable Lead Serial Number: 581162
Implantable Lead Serial Number: 755425
Implantable Pulse Generator Implant Date: 20230906
Lead Channel Impedance Value: 359 Ohm
Lead Channel Pacing Threshold Amplitude: 1.1 V
Lead Channel Pacing Threshold Pulse Width: 0.4 ms
Lead Channel Setting Pacing Amplitude: 1.7 V
Lead Channel Setting Pacing Pulse Width: 0.4 ms
Lead Channel Setting Sensing Sensitivity: 3 mV
Pulse Gen Serial Number: 651695
Zone Setting Status: 755011

## 2022-09-05 IMAGING — DX DG CHEST 1V
1 series · 1 of 1 positions shown · non-contrast
Comparison: 10/04/2019

CLINICAL DATA: Pt presents via [facility] with c/o shortness of breath
and congestion. Pt has hx of CHF. Pt chronically wears 2L at home.
Pt respirations currently even and unlabored

EXAM:
CHEST  1 VIEW

[chest ap]
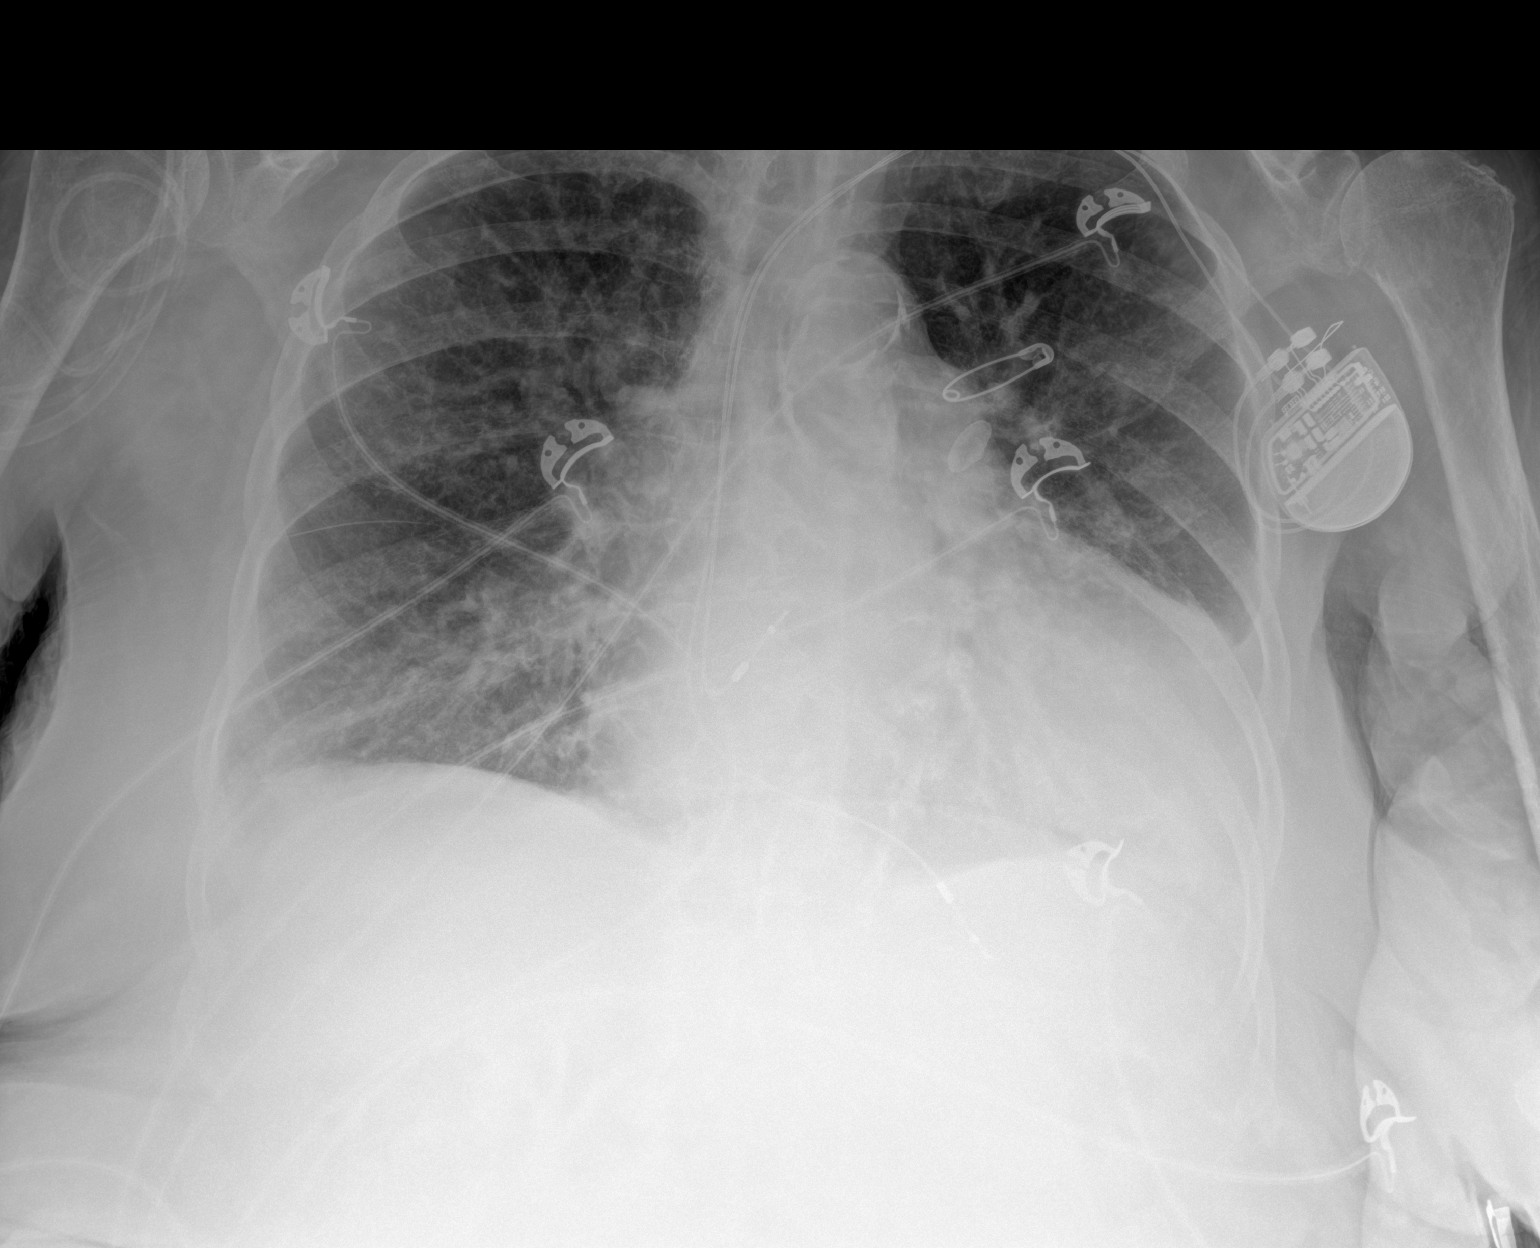

[1 of 1 positions shown; findings below may reference images not displayed]

FINDINGS: Cardiac silhouette is mildly enlarged. Stable left anterior chest
wall dual lead pacemaker. No mediastinal or hilar masses. Vascular
congestion with mild interstitial prominence similar to the prior
exam. Possible small pleural effusions. No lung consolidation to
suggest pneumonia. No pneumothorax.

Skeletal structures are demineralized but grossly intact.
IMPRESSION: 1. Cardiomegaly, vascular congestion and interstitial prominence,
with probable small effusions, similar to the prior study. Suspect
mild congestive heart failure. No evidence of pneumonia.

## 2022-09-19 NOTE — Progress Notes (Signed)
Remote pacemaker transmission.   

## 2022-11-27 ENCOUNTER — Inpatient Hospital Stay
Admission: EM | Admit: 2022-11-27 | Discharge: 2022-12-02 | DRG: 682 | Disposition: A | Discharge status: Skilled Nursing Facility | Payer: Medicare (Managed Care) | Attending: Internal Medicine | Admitting: Internal Medicine

## 2022-11-27 ENCOUNTER — Emergency Department: Payer: Medicare (Managed Care)

## 2022-11-27 ENCOUNTER — Encounter: Payer: Self-pay | Admitting: Internal Medicine

## 2022-11-27 ENCOUNTER — Other Ambulatory Visit: Payer: Self-pay

## 2022-11-27 DIAGNOSIS — W19XXXA Unspecified fall, initial encounter: Secondary | ICD-10-CM | POA: Diagnosis present

## 2022-11-27 DIAGNOSIS — I255 Ischemic cardiomyopathy: Secondary | ICD-10-CM | POA: Diagnosis present

## 2022-11-27 DIAGNOSIS — Z880 Allergy status to penicillin: Secondary | ICD-10-CM

## 2022-11-27 DIAGNOSIS — Z96653 Presence of artificial knee joint, bilateral: Secondary | ICD-10-CM | POA: Diagnosis present

## 2022-11-27 DIAGNOSIS — I495 Sick sinus syndrome: Secondary | ICD-10-CM | POA: Diagnosis present

## 2022-11-27 DIAGNOSIS — Y92009 Unspecified place in unspecified non-institutional (private) residence as the place of occurrence of the external cause: Secondary | ICD-10-CM | POA: Diagnosis not present

## 2022-11-27 DIAGNOSIS — F0390 Unspecified dementia without behavioral disturbance: Secondary | ICD-10-CM

## 2022-11-27 DIAGNOSIS — I25118 Atherosclerotic heart disease of native coronary artery with other forms of angina pectoris: Secondary | ICD-10-CM | POA: Diagnosis not present

## 2022-11-27 DIAGNOSIS — R296 Repeated falls: Secondary | ICD-10-CM | POA: Diagnosis not present

## 2022-11-27 DIAGNOSIS — E860 Dehydration: Secondary | ICD-10-CM | POA: Diagnosis present

## 2022-11-27 DIAGNOSIS — D638 Anemia in other chronic diseases classified elsewhere: Secondary | ICD-10-CM | POA: Insufficient documentation

## 2022-11-27 DIAGNOSIS — N3090 Cystitis, unspecified without hematuria: Secondary | ICD-10-CM | POA: Diagnosis not present

## 2022-11-27 DIAGNOSIS — Z950 Presence of cardiac pacemaker: Secondary | ICD-10-CM

## 2022-11-27 DIAGNOSIS — K219 Gastro-esophageal reflux disease without esophagitis: Secondary | ICD-10-CM | POA: Diagnosis present

## 2022-11-27 DIAGNOSIS — Z8249 Family history of ischemic heart disease and other diseases of the circulatory system: Secondary | ICD-10-CM

## 2022-11-27 DIAGNOSIS — E1122 Type 2 diabetes mellitus with diabetic chronic kidney disease: Secondary | ICD-10-CM | POA: Diagnosis present

## 2022-11-27 DIAGNOSIS — E43 Unspecified severe protein-calorie malnutrition: Secondary | ICD-10-CM | POA: Insufficient documentation

## 2022-11-27 DIAGNOSIS — N170 Acute kidney failure with tubular necrosis: Secondary | ICD-10-CM | POA: Diagnosis present

## 2022-11-27 DIAGNOSIS — I4819 Other persistent atrial fibrillation: Secondary | ICD-10-CM | POA: Diagnosis present

## 2022-11-27 DIAGNOSIS — F32A Depression, unspecified: Secondary | ICD-10-CM | POA: Insufficient documentation

## 2022-11-27 DIAGNOSIS — Z9181 History of falling: Secondary | ICD-10-CM

## 2022-11-27 DIAGNOSIS — E876 Hypokalemia: Secondary | ICD-10-CM | POA: Diagnosis present

## 2022-11-27 DIAGNOSIS — Z79899 Other long term (current) drug therapy: Secondary | ICD-10-CM

## 2022-11-27 DIAGNOSIS — F0393 Unspecified dementia, unspecified severity, with mood disturbance: Secondary | ICD-10-CM | POA: Diagnosis present

## 2022-11-27 DIAGNOSIS — N3000 Acute cystitis without hematuria: Secondary | ICD-10-CM | POA: Diagnosis not present

## 2022-11-27 DIAGNOSIS — I130 Hypertensive heart and chronic kidney disease with heart failure and stage 1 through stage 4 chronic kidney disease, or unspecified chronic kidney disease: Secondary | ICD-10-CM | POA: Diagnosis present

## 2022-11-27 DIAGNOSIS — J9611 Chronic respiratory failure with hypoxia: Secondary | ICD-10-CM | POA: Diagnosis present

## 2022-11-27 DIAGNOSIS — I5022 Chronic systolic (congestive) heart failure: Secondary | ICD-10-CM | POA: Diagnosis present

## 2022-11-27 DIAGNOSIS — Z6825 Body mass index (BMI) 25.0-25.9, adult: Secondary | ICD-10-CM

## 2022-11-27 DIAGNOSIS — E039 Hypothyroidism, unspecified: Secondary | ICD-10-CM | POA: Diagnosis present

## 2022-11-27 DIAGNOSIS — D631 Anemia in chronic kidney disease: Secondary | ICD-10-CM | POA: Diagnosis present

## 2022-11-27 DIAGNOSIS — I252 Old myocardial infarction: Secondary | ICD-10-CM

## 2022-11-27 DIAGNOSIS — N1832 Chronic kidney disease, stage 3b: Secondary | ICD-10-CM | POA: Diagnosis present

## 2022-11-27 DIAGNOSIS — R7989 Other specified abnormal findings of blood chemistry: Secondary | ICD-10-CM | POA: Insufficient documentation

## 2022-11-27 DIAGNOSIS — Z803 Family history of malignant neoplasm of breast: Secondary | ICD-10-CM

## 2022-11-27 DIAGNOSIS — E785 Hyperlipidemia, unspecified: Secondary | ICD-10-CM | POA: Diagnosis present

## 2022-11-27 DIAGNOSIS — Z8261 Family history of arthritis: Secondary | ICD-10-CM

## 2022-11-27 DIAGNOSIS — B9620 Unspecified Escherichia coli [E. coli] as the cause of diseases classified elsewhere: Secondary | ICD-10-CM | POA: Diagnosis present

## 2022-11-27 DIAGNOSIS — I2489 Other forms of acute ischemic heart disease: Secondary | ICD-10-CM | POA: Diagnosis present

## 2022-11-27 DIAGNOSIS — Z833 Family history of diabetes mellitus: Secondary | ICD-10-CM

## 2022-11-27 DIAGNOSIS — Z801 Family history of malignant neoplasm of trachea, bronchus and lung: Secondary | ICD-10-CM

## 2022-11-27 DIAGNOSIS — N390 Urinary tract infection, site not specified: Secondary | ICD-10-CM

## 2022-11-27 DIAGNOSIS — Z9981 Dependence on supplemental oxygen: Secondary | ICD-10-CM

## 2022-11-27 DIAGNOSIS — Z7982 Long term (current) use of aspirin: Secondary | ICD-10-CM

## 2022-11-27 DIAGNOSIS — Z1152 Encounter for screening for COVID-19: Secondary | ICD-10-CM | POA: Diagnosis not present

## 2022-11-27 DIAGNOSIS — N179 Acute kidney failure, unspecified: Secondary | ICD-10-CM

## 2022-11-27 DIAGNOSIS — I2510 Atherosclerotic heart disease of native coronary artery without angina pectoris: Secondary | ICD-10-CM | POA: Diagnosis present

## 2022-11-27 DIAGNOSIS — I509 Heart failure, unspecified: Secondary | ICD-10-CM

## 2022-11-27 DIAGNOSIS — Z955 Presence of coronary angioplasty implant and graft: Secondary | ICD-10-CM

## 2022-11-27 DIAGNOSIS — I951 Orthostatic hypotension: Secondary | ICD-10-CM | POA: Diagnosis present

## 2022-11-27 DIAGNOSIS — J4489 Other specified chronic obstructive pulmonary disease: Secondary | ICD-10-CM | POA: Diagnosis present

## 2022-11-27 DIAGNOSIS — Z87891 Personal history of nicotine dependence: Secondary | ICD-10-CM

## 2022-11-27 DIAGNOSIS — G4700 Insomnia, unspecified: Secondary | ICD-10-CM | POA: Diagnosis present

## 2022-11-27 DIAGNOSIS — F419 Anxiety disorder, unspecified: Secondary | ICD-10-CM | POA: Diagnosis present

## 2022-11-27 DIAGNOSIS — N183 Chronic kidney disease, stage 3 unspecified: Secondary | ICD-10-CM | POA: Diagnosis present

## 2022-11-27 DIAGNOSIS — Z882 Allergy status to sulfonamides status: Secondary | ICD-10-CM

## 2022-11-27 LAB — CBC WITH DIFFERENTIAL/PLATELET
Abs Immature Granulocytes: 0.05 10*3/uL (ref 0.00–0.07)
Basophils Absolute: 0.1 10*3/uL (ref 0.0–0.1)
Basophils Relative: 0 %
Eosinophils Absolute: 0.2 10*3/uL (ref 0.0–0.5)
Eosinophils Relative: 1 %
HCT: 31.8 % — ABNORMAL LOW (ref 36.0–46.0)
Hemoglobin: 9.5 g/dL — ABNORMAL LOW (ref 12.0–15.0)
Immature Granulocytes: 0 %
Lymphocytes Relative: 5 %
Lymphs Abs: 0.6 10*3/uL — ABNORMAL LOW (ref 0.7–4.0)
MCH: 25.8 pg — ABNORMAL LOW (ref 26.0–34.0)
MCHC: 29.9 g/dL — ABNORMAL LOW (ref 30.0–36.0)
MCV: 86.4 fL (ref 80.0–100.0)
Monocytes Absolute: 1.1 10*3/uL — ABNORMAL HIGH (ref 0.1–1.0)
Monocytes Relative: 9 %
Neutro Abs: 10.2 10*3/uL — ABNORMAL HIGH (ref 1.7–7.7)
Neutrophils Relative %: 85 %
Platelets: 379 10*3/uL (ref 150–400)
RBC: 3.68 MIL/uL — ABNORMAL LOW (ref 3.87–5.11)
RDW: 19.2 % — ABNORMAL HIGH (ref 11.5–15.5)
WBC: 12.2 10*3/uL — ABNORMAL HIGH (ref 4.0–10.5)
nRBC: 0 % (ref 0.0–0.2)

## 2022-11-27 LAB — TROPONIN I (HIGH SENSITIVITY)
Troponin I (High Sensitivity): 55 ng/L — ABNORMAL HIGH (ref <18)
Troponin I (High Sensitivity): 70 ng/L — ABNORMAL HIGH (ref <18)

## 2022-11-27 LAB — COMPREHENSIVE METABOLIC PANEL
ALT: 10 U/L (ref 0–44)
AST: 17 U/L (ref 15–41)
Albumin: 3.3 g/dL — ABNORMAL LOW (ref 3.5–5.0)
Alkaline Phosphatase: 50 U/L (ref 38–126)
Anion gap: 15 (ref 5–15)
BUN: 121 mg/dL — ABNORMAL HIGH (ref 8–23)
CO2: 27 mmol/L (ref 22–32)
Calcium: 11.8 mg/dL — ABNORMAL HIGH (ref 8.9–10.3)
Chloride: 95 mmol/L — ABNORMAL LOW (ref 98–111)
Creatinine, Ser: 2.64 mg/dL — ABNORMAL HIGH (ref 0.44–1.00)
GFR, Estimated: 17 mL/min — ABNORMAL LOW (ref >60)
Glucose, Bld: 138 mg/dL — ABNORMAL HIGH (ref 70–99)
Potassium: 4.1 mmol/L (ref 3.5–5.1)
Sodium: 137 mmol/L (ref 135–145)
Total Bilirubin: 0.7 mg/dL (ref 0.3–1.2)
Total Protein: 7.3 g/dL (ref 6.5–8.1)

## 2022-11-27 LAB — URINALYSIS, W/ REFLEX TO CULTURE (INFECTION SUSPECTED)
Bilirubin Urine: NEGATIVE
Glucose, UA: NEGATIVE mg/dL
Hgb urine dipstick: NEGATIVE
Ketones, ur: NEGATIVE mg/dL
Nitrite: POSITIVE — AB
Protein, ur: NEGATIVE mg/dL
Specific Gravity, Urine: 1.01 (ref 1.005–1.030)
Squamous Epithelial / HPF: 0 /[HPF] (ref 0–5)
pH: 5 (ref 5.0–8.0)

## 2022-11-27 LAB — LACTIC ACID, PLASMA
Lactic Acid, Venous: 0.9 mmol/L (ref 0.5–1.9)
Lactic Acid, Venous: 1.4 mmol/L (ref 0.5–1.9)

## 2022-11-27 LAB — LIPASE, BLOOD: Lipase: 33 U/L (ref 11–51)

## 2022-11-27 LAB — SARS CORONAVIRUS 2 BY RT PCR: SARS Coronavirus 2 by RT PCR: NEGATIVE

## 2022-11-27 LAB — TSH: TSH: 1.287 u[IU]/mL (ref 0.350–4.500)

## 2022-11-27 LAB — CK: Total CK: 74 U/L (ref 38–234)

## 2022-11-27 MED ORDER — FAMOTIDINE 20 MG PO TABS
20.0000 mg | ORAL_TABLET | Freq: Every day | ORAL | Status: DC
  Administered 2022-11-28 – 2022-12-02 (×5): 20 mg via ORAL
  Filled 2022-11-27 (×5): qty 1

## 2022-11-27 MED ORDER — TRAMADOL HCL 50 MG PO TABS
50.0000 mg | ORAL_TABLET | Freq: Every day | ORAL | Status: DC | PRN
  Administered 2022-11-29 – 2022-12-01 (×3): 50 mg via ORAL
  Filled 2022-11-27 (×5): qty 1

## 2022-11-27 MED ORDER — HEPARIN SODIUM (PORCINE) 5000 UNIT/ML IJ SOLN
5000.0000 [IU] | Freq: Three times a day (TID) | INTRAMUSCULAR | Status: DC
  Administered 2022-11-27 – 2022-12-02 (×15): 5000 [IU] via SUBCUTANEOUS
  Filled 2022-11-27 (×15): qty 1

## 2022-11-27 MED ORDER — ACETAMINOPHEN 650 MG RE SUPP: 650.0000 mg | Freq: Four times a day (QID) | RECTAL | Status: AC | PRN

## 2022-11-27 MED ORDER — SODIUM CHLORIDE 0.9 % IV SOLN
2.0000 g | INTRAVENOUS | Status: DC
  Administered 2022-11-28 – 2022-11-29 (×2): 2 g via INTRAVENOUS
  Filled 2022-11-27 (×3): qty 20

## 2022-11-27 MED ORDER — SODIUM CHLORIDE 0.9 % IV SOLN
2.0000 g | Freq: Once | INTRAVENOUS | Status: AC
  Administered 2022-11-27: 2 g via INTRAVENOUS
  Filled 2022-11-27: qty 20

## 2022-11-27 MED ORDER — EUCERIN ORIGINAL HEALING EX LOTN: 1.0000 | TOPICAL_LOTION | Freq: Every day | CUTANEOUS | Status: DC

## 2022-11-27 MED ORDER — SENNOSIDES-DOCUSATE SODIUM 8.6-50 MG PO TABS: 1.0000 | ORAL_TABLET | Freq: Every evening | ORAL | Status: DC | PRN

## 2022-11-27 MED ORDER — PRESERVISION AREDS 2+MULTI VIT PO CAPS: 1.0000 | ORAL_CAPSULE | Freq: Two times a day (BID) | ORAL | Status: DC

## 2022-11-27 MED ORDER — FERROUS SULFATE 325 (65 FE) MG PO TABS
325.0000 mg | ORAL_TABLET | Freq: Every day | ORAL | Status: DC
  Administered 2022-11-28 – 2022-12-02 (×5): 325 mg via ORAL
  Filled 2022-11-27 (×5): qty 1

## 2022-11-27 MED ORDER — ONDANSETRON HCL 4 MG/2ML IJ SOLN: 4.0000 mg | Freq: Four times a day (QID) | INTRAMUSCULAR | Status: AC | PRN

## 2022-11-27 MED ORDER — LACTATED RINGERS IV BOLUS
1000.0000 mL | Freq: Once | INTRAVENOUS | Status: AC
  Administered 2022-11-27: 1000 mL via INTRAVENOUS

## 2022-11-27 MED ORDER — ACETAMINOPHEN 325 MG PO TABS
650.0000 mg | ORAL_TABLET | Freq: Four times a day (QID) | ORAL | Status: AC | PRN
  Administered 2022-12-02: 650 mg via ORAL
  Filled 2022-11-27: qty 2

## 2022-11-27 MED ORDER — QUETIAPINE FUMARATE 25 MG PO TABS
100.0000 mg | ORAL_TABLET | Freq: Every day | ORAL | Status: DC
  Administered 2022-11-27 – 2022-12-01 (×5): 100 mg via ORAL
  Filled 2022-11-27 (×5): qty 4

## 2022-11-27 MED ORDER — IRON-VITAMIN C 100-250 MG PO TABS: 1.0000 | ORAL_TABLET | Freq: Every day | ORAL | Status: DC

## 2022-11-27 MED ORDER — TRAMADOL HCL ER 100 MG PO TB24: 100.0000 mg | ORAL_TABLET | Freq: Every day | ORAL | Status: DC

## 2022-11-27 MED ORDER — SERTRALINE HCL 50 MG PO TABS
50.0000 mg | ORAL_TABLET | Freq: Every day | ORAL | Status: DC
  Administered 2022-11-28 – 2022-12-02 (×5): 50 mg via ORAL
  Filled 2022-11-27 (×5): qty 1

## 2022-11-27 MED ORDER — NITROGLYCERIN 0.4 MG SL SUBL: 0.4000 mg | SUBLINGUAL_TABLET | SUBLINGUAL | Status: DC | PRN

## 2022-11-27 MED ORDER — HYDROCERIN EX CREA: TOPICAL_CREAM | Freq: Every day | CUTANEOUS | Status: DC

## 2022-11-27 MED ORDER — VITAMIN C 500 MG PO TABS
250.0000 mg | ORAL_TABLET | Freq: Every day | ORAL | Status: DC
  Administered 2022-11-28 – 2022-12-02 (×5): 250 mg via ORAL
  Filled 2022-11-27: qty 0.5
  Filled 2022-11-27 (×5): qty 1

## 2022-11-27 MED ORDER — DEXTROMETHORPHAN-GUAIFENESIN 10-100 MG/5ML PO LIQD: 10.0000 mL | Freq: Every evening | ORAL | Status: DC | PRN

## 2022-11-27 MED ORDER — SODIUM CHLORIDE 0.9 % IV BOLUS
1000.0000 mL | Freq: Once | INTRAVENOUS | Status: AC
  Administered 2022-11-27: 1000 mL via INTRAVENOUS

## 2022-11-27 MED ORDER — ZINC OXIDE 13 % EX CREA: 1.0000 | TOPICAL_CREAM | Freq: Three times a day (TID) | CUTANEOUS | Status: DC

## 2022-11-27 MED ORDER — ONDANSETRON HCL 4 MG PO TABS: 4.0000 mg | ORAL_TABLET | Freq: Four times a day (QID) | ORAL | Status: AC | PRN

## 2022-11-27 MED ORDER — GUAIFENESIN-DM 100-10 MG/5ML PO SYRP: 5.0000 mL | ORAL_SOLUTION | Freq: Every evening | ORAL | Status: DC | PRN

## 2022-11-27 NOTE — Assessment & Plan Note (Addendum)
Patient is also clinically dry on physical exam Suspect secondary to urinary tract infection, present on admission Baseline CKD 3B Continue ceftriaxone 2 g IV daily Status post LR 1 L bolus and sodium chloride 1 L bolus per EDP Recheck BMP in a.m.

## 2022-11-27 NOTE — ED Triage Notes (Addendum)
89 yo Female found down at [facility] she was found on the ground in the front of her recliner Arrived [facility] unknown downtime EMS unknown medication Hx Decreased in mental status, EMS states she was disoriented to time and situation  Falls increased recently   EMS vitals: 138/62, HR 70s, O2 at home 2L

## 2022-11-27 NOTE — Assessment & Plan Note (Signed)
Complete echo 10/12/2021: Was read as estimated ejection fraction is 45 to 50% Does not appear to be in acute exacerbation at this time

## 2022-11-27 NOTE — Assessment & Plan Note (Addendum)
High sensitive troponin was initially 55 and on repeat was 70 Suspect secondary to demand ischemia in setting of acute kidney injury Patient does not endorse chest pain or shortness of breath at bedside with multiple questions asked Complete echo ordered, cardiology not consulted on admission

## 2022-11-27 NOTE — ED Provider Notes (Signed)
[facility] Provider Note    Event Date/Time   First MD Initiated Contact with Patient 11/27/22 0815     (approximate)   History   Chief Complaint: Genia Hotter at home, decreased mental status )   HPI  [facility] Simmons is a 89 y.o. female with a history of diabetes, hypothyroidism, atrial fibrillation, COPD, CHF, CKD who is brought to the [facility] by EMS due to being found on the floor in front of her recliner at her residential facility.  Appears confused.  Complains of pain in her lower back but otherwise denies any acute symptoms.     Physical Exam   Triage Vital Signs: [facility] Triage Vitals [11/27/22 0821]  Encounter Vitals Group     BP (!) 124/104     Systolic BP Percentile      Diastolic BP Percentile      Pulse Rate 70     Resp 18     Temp 98.3 F (36.8 C)     Temp Source Oral     SpO2 100 %     Weight      Height      Head Circumference      Peak Flow      Pain Score      Pain Loc      Pain Education      Exclude from Growth Chart     Most recent vital signs: Vitals:   11/27/22 1030 11/27/22 1045  BP: (!) 152/43   Pulse: 70 69  Resp: 18 18  Temp:    SpO2: 99% 100%    General: Awake, no distress.  Oriented to person and place CV:  Good peripheral perfusion.  Regular rate and rhythm Resp:  Normal effort.  Clear to auscultation bilaterally Abd:  No distention.  Soft with mild generalized tenderness Other:  Tenderness at the right hip laterally without obvious deformity.  Dry oral mucosa   [facility] Results / Procedures / Treatments   Labs (all labs ordered are listed, but only abnormal results are displayed) Labs Reviewed  COMPREHENSIVE METABOLIC PANEL - Abnormal; Notable for the following components:      Result Value   Chloride 95 (*)    Glucose, Bld 138 (*)    BUN 121 (*)    Creatinine, Ser 2.64 (*)    Calcium 11.8 (*)    Albumin 3.3 (*)    GFR, Estimated 17 (*)    All other components within normal limits  CBC WITH  DIFFERENTIAL/PLATELET - Abnormal; Notable for the following components:   WBC 12.2 (*)    RBC 3.68 (*)    Hemoglobin 9.5 (*)    HCT 31.8 (*)    MCH 25.8 (*)    MCHC 29.9 (*)    RDW 19.2 (*)    Neutro Abs 10.2 (*)    Lymphs Abs 0.6 (*)    Monocytes Absolute 1.1 (*)    All other components within normal limits  URINALYSIS, W/ REFLEX TO CULTURE (INFECTION SUSPECTED) - Abnormal; Notable for the following components:   Color, Urine STRAW (*)    APPearance CLEAR (*)    Nitrite POSITIVE (*)    Leukocytes,Ua MODERATE (*)    Bacteria, UA RARE (*)    All other components within normal limits  TROPONIN I (HIGH SENSITIVITY) - Abnormal; Notable for the following components:   Troponin I (High Sensitivity) 55 (*)    All other components within normal limits  SARS CORONAVIRUS 2 BY RT  PCR  URINE CULTURE  LIPASE, BLOOD  CK  TSH  TROPONIN I (HIGH SENSITIVITY)     EKG Interpreted by me Atrial fibrillation, rate 70. Left axis, LBBB. No acute ischemic changes   RADIOLOGY CTH interpreted by me, negative for ICH.  CT c/s negative.  Cxr unremarkable.\ CT a/p pending.   PROCEDURES:  Procedures   MEDICATIONS ORDERED IN [facility]: Medications  lactated ringers bolus 1,000 mL (has no administration in time range)  sodium chloride 0.9 % bolus 1,000 mL (1,000 mLs Intravenous New Bag/Given 11/27/22 0927)  cefTRIAXone (ROCEPHIN) 2 g in sodium chloride 0.9 % 100 mL IVPB (2 g Intravenous New Bag/Given 11/27/22 1113)     IMPRESSION / MDM / ASSESSMENT AND PLAN / [facility] COURSE  I reviewed the triage vital signs and the nursing notes.  DDx: Intracranial hemorrhage, C-spine fracture, lumbar strain/fracture, colitis, diverticulitis, pancreatitis, COVID, dehydration, electrolyte abnormality, anemia, UTI, hip fracture  Patient's presentation is most consistent with acute presentation with potential threat to life or bodily function.  Elderly, frail patient comes to the [facility] with increased falls recently,  found on the floor in front of her recliner this morning.  Oriented x 2 currently.  Differential is broad.  Will obtain labs and imaging.  ----------------------------------------- 12:25 PM on 11/27/2022 ----------------------------------------- Labs show UTI and AKI. CT a/p pending to r/o ureteral obstruction or occult l-spine / pelvic trauma. Will need to admit. D/w family member at bedside who agrees.      FINAL CLINICAL IMPRESSION(S) / [facility] DIAGNOSES   Final diagnoses:  Cystitis  AKI (acute kidney injury) (HCC)  Chronic dementia (HCC)  Fall in home, initial encounter     Rx / DC Orders   [facility] Discharge Orders     None        Note:  This document was prepared using Dragon voice recognition software and may include unintentional dictation errors.   Sharman Cheek, MD 11/27/22 1226

## 2022-11-27 NOTE — Assessment & Plan Note (Signed)
Baseline hemoglobin is 7.7-8.6 over the last year Patient's hemoglobin is 9.5 on admission, there may be a component of hemoconcentration No signs of acute bleeding at this time Recheck CBC in a.m.

## 2022-11-27 NOTE — ED Notes (Signed)
Lab called to check on CMP, lipase, CK and TSH. They're publishing them soon.

## 2022-11-27 NOTE — Progress Notes (Signed)
PHARMACIST - PHYSICIAN ORDER COMMUNICATION  CONCERNING: P&T Medication Policy on Herbal Medications  DESCRIPTION:  This patient's order for:  Preservision  has been noted.  This product(s) is classified as an "herbal" or natural product. Due to a lack of definitive safety studies or FDA approval, nonstandard manufacturing practices, plus the potential risk of unknown drug-drug interactions while on inpatient medications, the Pharmacy and Therapeutics Committee does not permit the use of "herbal" or natural products of this type within [facility].   ACTION TAKEN: The pharmacy department is unable to verify this order at this time and your patient has been informed of this safety policy. Please reevaluate patient's clinical condition at discharge and address if the herbal or natural product(s) should be resumed at that time.

## 2022-11-27 NOTE — Assessment & Plan Note (Addendum)
Present on admission Continue ceftriaxone 2 g IV daily, 5 days ordered

## 2022-11-27 NOTE — Assessment & Plan Note (Signed)
-  Resumed home sertraline 50 mg daily

## 2022-11-27 NOTE — H&P (Signed)
History and Physical   Evelyn Simmons ZOX:[id number] DOB: 12-Apr-1929 DOA: 11/27/2022  PCP: [facility]  Patient coming from: Home via EMS  I have personally briefly reviewed patient's old medical records in [facility] EMR.  Chief Concern: Increased fall, altered mental status  HPI: Ms. Evelyn Simmons is a 89 year old female with history of dementia, history of COPD, CKD 3B, GERD, insomnia, hyperlipidemia, depression, anxiety, hypertension, who presents to the emergency department for chief concerns of increasing fall and altered mental status.  Vitals in the ED showed temperature 98.3, respiration rate of 18, heart rate 70, blood pressure 124/104, SpO2 100% on 2 L nasal cannula.  Serum sodium is 137, potassium 4.1, chloride 95, bicarb 27, BUN of 121, serum creatinine 2.64, EGFR 17, nonfasting blood glucose 138, WBC 12.2, hemoglobin 9.5, platelets of 379.  CK is 174.  High sensitive troponin is 55 and on repeat is 70.  UA with positive for moderate leukocytes and positive for nitrates.  ED treatment: LR 1 L bolus, sodium chloride 1 L bolus, ceftriaxone 2 g IV one-time dose. ---------------------------------- At bedside, patient was able to tell me her full name and her current location of hospital.  She was not able to tell me the current calendar year.  When asked about her age, she states she thinks she is in her 89s.  She tells me to call her daughter Evelyn Simmons if I have any questions.  Social history: She lives at home on her own.  ROS: Unable to complete as patient appears to have memory disturbances consistent with history of dementia  ED Course: Discussed with emergency medicine provider, patient requiring hospitalization for chief concerns of acute kidney injury and UTI.  Assessment/Plan  Principal Problem:   AKI (acute kidney injury) (HCC) Active Problems:   UTI (urinary tract infection)   CAD (coronary artery disease)   Hyperlipidemia   CHF (congestive  heart failure) (HCC)   Chronic respiratory failure with hypoxia (HCC)   Falling   Anemia of chronic disease   Depression   Elevated troponin   Assessment and Plan:  * AKI (acute kidney injury) (HCC) Patient is also clinically dry on physical exam Suspect secondary to urinary tract infection, present on admission Baseline CKD 3B Continue ceftriaxone 2 g IV daily Status post LR 1 L bolus and sodium chloride 1 L bolus per EDP Recheck BMP in a.m.  UTI (urinary tract infection) Present on admission Continue ceftriaxone 2 g IV daily, 5 days ordered  Elevated troponin High sensitive troponin was initially 55 and on repeat was 70 Suspect secondary to demand ischemia in setting of acute kidney injury Patient does not endorse chest pain or shortness of breath at bedside with multiple questions asked Complete echo ordered, cardiology not consulted on admission  Depression Resumed home sertraline 50 mg daily  Anemia of chronic disease Baseline hemoglobin is 7.7-8.6 over the last year Patient's hemoglobin is 9.5 on admission, there may be a component of hemoconcentration No signs of acute bleeding at this time Recheck CBC in a.m.  CHF (congestive heart failure) (HCC) Complete echo 10/12/2021: Was read as estimated ejection fraction is 45 to 50% Does not appear to be in acute exacerbation at this time  Psychiatric imbalance-resumed home quetiapine 100 mg nightly  Chart reviewed.   DVT prophylaxis: Heparin 5000 units subcutaneous every 8 hours Code Status: Full code Diet: Heart healthy Family Communication: Attempted to call Evelyn Simmons at [phone], no pickup.  Voice message greeting did not indicate name,  no message was left Disposition Plan: Pending clinical course Consults called: None at this time Admission status: Telemetry medical, inpatient  Past Medical History:  Diagnosis Date   (HFmrEF) heart failure with mid-range ejection fraction (HCC)    a. 09/2019 Echo: EF  45-50%, glob HK, mild conc LVH, mildly elev PASP. Sev dil LA. Mildly dil RA. Mild MR.   Anemia    Arthritis    Asthma    CAD (coronary artery disease)    a. 1995 s/p PCI/BMS LAD; b. 2011 s/p PCI/DES RCA.   CHF (congestive heart failure) (HCC)    Chronic kidney disease    COPD (chronic obstructive pulmonary disease) (HCC)    Depression    Diabetes mellitus without complication (HCC)    Diverticulitis    Dysrhythmia    GERD (gastroesophageal reflux disease)    Hyperlipidemia    Hypertension    Hypothyroid    pt unsure, not currently on medication   Ischemic cardiomyopathy    MI (myocardial infarction) (HCC)    1995   Persistent atrial fibrillation (HCC)    a. No OAC 2/2 h/o bleeding/anemia; b. Longterm amio therapy; c. 08/2019 - recurrent afib-->rate-controlled.   Presence of permanent cardiac pacemaker    Radiculopathy    Past Surgical History:  Procedure Laterality Date   ABDOMINAL HYSTERECTOMY     APPENDECTOMY     AV NODE ABLATION N/A 12/13/2019   Procedure: AV NODE ABLATION;  Surgeon: Duke Salvia, MD;  Location: [facility] INVASIVE CV LAB;  Service: Cardiovascular;  Laterality: N/A;   CARDIAC CATHETERIZATION     COLONOSCOPY WITH PROPOFOL N/A 10/21/2019   Procedure: COLONOSCOPY WITH PROPOFOL;  Surgeon: Toney Reil, MD;  Location: [facility] ENDOSCOPY;  Service: Gastroenterology;  Laterality: N/A;   CORONARY ANGIOPLASTY WITH STENT PLACEMENT  12/11/1993   RCA   ESOPHAGOGASTRODUODENOSCOPY (EGD) WITH PROPOFOL N/A 10/21/2019   Procedure: ESOPHAGOGASTRODUODENOSCOPY (EGD) WITH PROPOFOL;  Surgeon: Toney Reil, MD;  Location: [facility] ENDOSCOPY;  Service: Gastroenterology;  Laterality: N/A;   FOOT SURGERY Bilateral    Over 25 years ago- Bunionectomy   GIVENS CAPSULE STUDY N/A 10/22/2019   Procedure: GIVENS CAPSULE STUDY;  Surgeon: Toney Reil, MD;  Location: [facility] ENDOSCOPY;  Service: Gastroenterology;  Laterality: N/A;   INSERT / REPLACE / REMOVE PACEMAKER  09/2012   Model #  K173  serial # [id number]   JOINT REPLACEMENT     bilateral knee replacements   PPM GENERATOR CHANGEOUT N/A 10/17/2021   Procedure: PPM GENERATOR CHANGEOUT;  Surgeon: Duke Salvia, MD;  Location: [facility] INVASIVE CV LAB;  Service: Cardiovascular;  Laterality: N/A;   Social History:  reports that she quit smoking about 28 years ago. Her smoking use included cigarettes. She has quit using smokeless tobacco. She reports current alcohol use. She reports that she does not use drugs.  Allergies  Allergen Reactions   Penicillins Nausea And Vomiting, Rash and Hives   Other Other (See Comments)   Sulfa Antibiotics     Pt unsure reaction, was told in hospital she had allergy   Penicillin G Rash   Family History  Problem Relation Age of Onset   Arthritis Mother    Heart disease Mother    Hypertension Mother    Diabetes Mother    Arthritis Father    Heart disease Father    Hypertension Father    Cancer Sister        breast cancer   Diabetes Brother    Cancer Daughter  lung cancer   Diabetes Sister    Family history: Family history reviewed and not pertinent.  Prior to Admission medications   Medication Sig Start Date End Date Taking? Authorizing Provider  acetaminophen (TYLENOL) 650 MG CR tablet SMARTSIG:1 Tablet(s) By Mouth Twice Daily 11/11/20   [provider]  antiseptic oral rinse (BIOTENE) LIQD 10 mLs by Mouth Rinse route 2 (two) times daily as needed for dry mouth. 10/10/21   [provider]  aspirin EC 81 MG tablet Take 81 mg by mouth daily.    [provider]  Blood Glucose Monitoring Suppl (ACCU-CHEK NANO SMARTVIEW) W/DEVICE KIT Please issue nano meter by AccuChek along with test strips at checking frequency of 2x daily. 90 day supply. E11.9 05/13/14   Phadke, Janina Mayo, MD  cholecalciferol (VITAMIN D) 1000 units tablet Take 1,000 Units by mouth daily.    [provider]  diclofenac Sodium (VOLTAREN) 1 % GEL Apply 1 application topically 4 (four)  times daily as needed (left knee pain.).     [provider]  famotidine (PEPCID) 20 MG tablet Take 20 mg by mouth daily. 10/12/21   [provider]  FEROSUL 325 (65 Fe) MG tablet Take 325 mg by mouth daily with breakfast. 11/11/20   [provider]  furosemide (LASIX) 10 MG/ML injection Inject 40 mg into the muscle once. 03/08/22   [provider]  glucose blood (FREESTYLE LITE) test strip Use as instructed 05/30/14   Carollee Leitz, RN  ipratropium-albuterol (DUONEB) 0.5-2.5 (3) MG/3ML SOLN Take by nebulization 2 (two) times daily. 11/16/21   [provider]  Lancets (FREESTYLE) lancets Use as instructed 05/30/14   Carollee Leitz, RN  lidocaine (LIDODERM) 5 % Place 1 patch onto the skin daily as needed (knee pain.). Remove & Discard patch within 12 hours or as directed by MD    [provider]  loratadine (CLARITIN) 10 MG tablet Take 10 mg by mouth daily.    [provider]  metolazone (ZAROXOLYN) 5 MG tablet Take 1 tablet by mouth 3 (three) times a week. 11/11/21   [provider]  Multiple Vitamins-Minerals (PRESERVISION AREDS 2+MULTI VIT) CAPS Take 1 capsule by mouth 2 (two) times daily.    [provider]  naloxone [facility]) nasal spray 4 mg/0.1 mL Place 1 spray into the nose See admin instructions. Administer 1 spray in one nostril. Repeat every 3 minutes if no response.    [provider]  nitroGLYCERIN (NITROSTAT) 0.4 MG SL tablet Place 0.4 mg under the tongue every 5 (five) minutes x 3 doses as needed for chest pain.     [provider]  nystatin-triamcinolone (MYCOLOG II) cream Apply 1 application topically 3 (three) times daily as needed (affected area(s)).    [provider]  ondansetron (ZOFRAN) 4 MG tablet Take 4 mg by mouth every 8 (eight) hours as needed. 03/04/22   [provider]  pantoprazole (PROTONIX) 20 MG tablet Take 20 mg by mouth daily.    [provider]   polyethylene glycol (MIRALAX / GLYCOLAX) 17 g packet Take 17 g by mouth daily as needed (constipation.).    [provider]  potassium chloride SA (KLOR-CON M) 20 MEQ tablet Take 20 mEq by mouth daily. 11/11/21   [provider]  QUEtiapine (SEROQUEL) 25 MG tablet Take 25 mg by mouth at bedtime.     [provider]  rosuvastatin (CRESTOR) 40 MG tablet Take 1 tablet (40 mg total) by mouth daily.  05/09/14   Antonieta Iba, MD  sertraline (ZOLOFT) 50 MG tablet Take 50 mg by mouth daily. 04/12/22   [provider]  torsemide (DEMADEX) 20 MG tablet Take 1 tablet (20 mg total) by mouth daily as needed. Take 2 tablets (40 mg) by mouth once daily 10/18/21   Regalado, Belkys A, MD  traMADol (ULTRAM-ER) 100 MG 24 hr tablet Take 100 mg by mouth daily. 04/12/22   [provider]  traZODone (DESYREL) 100 MG tablet Take 100 mg by mouth at bedtime.    [provider]  TUSSIN COUGH+CHEST CONGEST DM 10-100 MG/5ML liquid Take 10 mLs by mouth at bedtime as needed for cough. 10/10/21   [provider]  vitamin B-12 1000 MCG tablet Take 1 tablet (1,000 mcg total) by mouth daily. 10/11/19   Pennie Banter, DO   Physical Exam: Vitals:   11/27/22 1330 11/27/22 1400 11/27/22 1430 11/27/22 1500  BP: (!) 97/55 (!) 130/50 109/75 (!) 159/137  Pulse: 72 71  68  Resp: 20 (!) 21 (!) 23   Temp:      TempSrc:      SpO2: 100% 100%  97%  Weight:      Height:       Constitutional: appears age-appropriate, frail, NAD, calm Eyes: PERRL, lids and conjunctivae normal ENMT: Mucous membranes are moist. Posterior pharynx clear of any exudate or lesions. Age-appropriate dentition. Hearing appropriate Neck: normal, supple, no masses, no thyromegaly Respiratory: clear to auscultation bilaterally, no wheezing, no crackles. Normal respiratory effort. No accessory muscle use.  Cardiovascular: Regular rate and rhythm, no murmurs / rubs / gallops. No extremity edema. 2+ pedal  pulses. No carotid bruits.  Abdomen: Obese abdomen, no tenderness, no masses palpated, no hepatosplenomegaly. Bowel sounds positive.  Musculoskeletal: no clubbing / cyanosis. No joint deformity upper and lower extremities. Good ROM, no contractures, no atrophy. Normal muscle tone.  Skin: no rashes, lesions, ulcers. No induration Neurologic: Sensation intact. Strength 5/5 in all 4.  Psychiatric: Normal judgment and insight. Alert and oriented x 3. Normal mood.   EKG: independently reviewed, showing atrial fibrillation with rate of 70, QTc 524  Chest x-ray on Admission: I personally reviewed and I agree with radiologist reading as below.  CT L-SPINE NO CHARGE  Result Date: 11/27/2022 CLINICAL DATA:  Abdominal pain EXAM: CT LUMBAR SPINE WITHOUT CONTRAST TECHNIQUE: Multidetector CT imaging of the lumbar spine was performed without intravenous contrast administration. Multiplanar CT image reconstructions were also generated. RADIATION DOSE REDUCTION: This exam was performed according to the departmental dose-optimization program which includes automated exposure control, adjustment of the mA and/or kV according to patient size and/or use of iterative reconstruction technique. COMPARISON:  CT abdomen/pelvis 12/23/2021 FINDINGS: Segmentation: Standard; the lowest formed disc space is designated L5-S1. Alignment: There is marked levocurvature centered at L1-L2. There is no antero or retrolisthesis. Vertebrae: Vertebral body heights are preserved, without evidence of acute fracture in the lumbar spine. There is acute angulation in the lower sacrum at the S4/coccyx junction which appears new since the CT abdomen/pelvis from 2023, suspicious for an acute fracture (8-76). There is no suspicious osseous lesion. Paraspinal and other soft tissues: The paraspinal soft tissues are unremarkable. The abdominal and pelvic viscera are assessed on the separately dictated CT abdomen/pelvis. Disc levels: There is marked disc  space narrowing and degenerative endplate change at L1-L2 through L5-S1. There is moderate facet arthropathy at L3-L4 through L5-S1. There are mild disc bulges throughout the lumbar spine without evidence of high-grade spinal  canal stenosis. There is severe right neural foraminal stenosis at L5-S1. IMPRESSION: 1. Suspect acute nondisplaced fracture at the sacrococcygeal junction. Correlate with point tenderness. 2. No acute fracture or traumatic malalignment in the lumbar spine. 3. Marked levocurvature with associated advanced disc degeneration throughout the lumbar spine. Severe right neural foraminal stenosis at L5-S1. Electronically Signed   By: Lesia Hausen M.D.   On: 11/27/2022 14:59   CT ABDOMEN PELVIS WO CONTRAST  Result Date: 11/27/2022 CLINICAL DATA:  Acute generalized abdominal pain. EXAM: CT ABDOMEN AND PELVIS WITHOUT CONTRAST TECHNIQUE: Multidetector CT imaging of the abdomen and pelvis was performed following the standard protocol without IV contrast. RADIATION DOSE REDUCTION: This exam was performed according to the departmental dose-optimization program which includes automated exposure control, adjustment of the mA and/or kV according to patient size and/or use of iterative reconstruction technique. COMPARISON:  December 23, 2021. FINDINGS: Lower chest: No acute abnormality. Hepatobiliary: Gallstone is noted. Hepatic steatosis. No biliary dilatation. Pancreas: Unremarkable. No pancreatic ductal dilatation or surrounding inflammatory changes. Spleen: Calcified granulomas are noted. Adrenals/Urinary Tract: Adrenal glands appear normal. Vascular calcifications are noted. Nonobstructive left renal calculus is noted. No hydronephrosis or renal obstruction is noted. Urinary bladder is unremarkable. Stomach/Bowel: Stomach is unremarkable. There is no evidence of bowel obstruction or inflammation. Status post appendectomy. Sigmoid diverticulosis without inflammation. Vascular/Lymphatic: Aortic  atherosclerosis. No enlarged abdominal or pelvic lymph nodes. Reproductive: Status post hysterectomy. No adnexal masses. Other: No abdominal wall hernia or abnormality. No abdominopelvic ascites. Musculoskeletal: No acute or significant osseous findings. IMPRESSION: Hepatic steatosis. Nonobstructive left renal calculus. Sigmoid diverticulosis without inflammation. Cholelithiasis without inflammation. Aortic Atherosclerosis (ICD10-I70.0). Electronically Signed   By: Lupita Raider M.D.   On: 11/27/2022 14:39   DG Hip Unilat W or Wo Pelvis 2-3 Views Right  Result Date: 11/27/2022 CLINICAL DATA:  Right hip pain after fall. EXAM: DG HIP (WITH OR WITHOUT PELVIS) 2-3V RIGHT COMPARISON:  None Available. FINDINGS: There is irregularity of the pubic symphysis and right superior pubic ramus for which underlying subtle undisplaced fracture is difficult to exclude on this exam. Correlate clinically to determine the need for additional imaging with CT scan. There is apparent disruption of the lateral aspect of the right femoral head which is not well evaluated on this exam and most likely projectional; however, underlying subtle fracture can not be excluded on this exam. No acute fracture or dislocation. No aggressive osseous lesion. Visualized sacral arcuate lines are unremarkable. There are mild degenerative changes of bilateral hip joints without significant joint space narrowing. Osteophytosis of the superior acetabulum. No radiopaque foreign bodies. IMPRESSION: 1. Irregularity of the pubic symphysis and right superior pubic ramus for which underlying subtle undisplaced fracture is difficult to exclude on this exam. 2. Apparent disruption of the lateral aspect of the right femoral head which is not well evaluated on this exam and most likely projectional; however, underlying subtle fracture can not be excluded on this exam. Correlate clinically to determine the need for additional imaging with CT scan. Electronically  Signed   By: Jules Schick M.D.   On: 11/27/2022 09:43   CT Head Wo Contrast  Result Date: 11/27/2022 CLINICAL DATA:  Trauma.  Found down EXAM: CT HEAD WITHOUT CONTRAST CT CERVICAL SPINE WITHOUT CONTRAST TECHNIQUE: Multidetector CT imaging of the head and cervical spine was performed following the standard protocol without intravenous contrast. Multiplanar CT image reconstructions of the cervical spine were also generated. RADIATION DOSE REDUCTION: This exam was performed according to the departmental dose-optimization  program which includes automated exposure control, adjustment of the mA and/or kV according to patient size and/or use of iterative reconstruction technique. COMPARISON:  CT Head 12/23/21, CT C Spine 10/11/21 FINDINGS: CT HEAD FINDINGS Brain: No evidence of acute infarction, hemorrhage, hydrocephalus, extra-axial collection or mass lesion/mass effect. Sequela of severe chronic microvascular ischemic change. Generalized volume loss. Vascular: No hyperdense vessel or unexpected calcification. Skull: Normal. Negative for fracture or focal lesion. Sinuses/Orbits: No middle ear or mastoid effusion. Paranasal sinuses are clear. Bilateral lens replacement. Orbits are otherwise unremarkable. Other: None. CT CERVICAL SPINE FINDINGS Alignment: Grade 1 anterolisthesis of C5 on C6 and C7 on T1. Skull base and vertebrae: No acute fracture. No primary bone lesion or focal pathologic process. Soft tissues and spinal canal: No prevertebral fluid or swelling. No visible canal hematoma. Disc levels:  No evidence of high-grade spinal canal stenosis. Upper chest: Biapical emphysema Other: None IMPRESSION: 1. No acute intracranial abnormality. Sequela of severe chronic microvascular ischemic change. 2. No acute fracture or traumatic malalignment of the cervical spine. Electronically Signed   By: Lorenza Cambridge M.D.   On: 11/27/2022 09:23   CT Cervical Spine Wo Contrast  Result Date: 11/27/2022 CLINICAL DATA:   Trauma.  Found down EXAM: CT HEAD WITHOUT CONTRAST CT CERVICAL SPINE WITHOUT CONTRAST TECHNIQUE: Multidetector CT imaging of the head and cervical spine was performed following the standard protocol without intravenous contrast. Multiplanar CT image reconstructions of the cervical spine were also generated. RADIATION DOSE REDUCTION: This exam was performed according to the departmental dose-optimization program which includes automated exposure control, adjustment of the mA and/or kV according to patient size and/or use of iterative reconstruction technique. COMPARISON:  CT Head 12/23/21, CT C Spine 10/11/21 FINDINGS: CT HEAD FINDINGS Brain: No evidence of acute infarction, hemorrhage, hydrocephalus, extra-axial collection or mass lesion/mass effect. Sequela of severe chronic microvascular ischemic change. Generalized volume loss. Vascular: No hyperdense vessel or unexpected calcification. Skull: Normal. Negative for fracture or focal lesion. Sinuses/Orbits: No middle ear or mastoid effusion. Paranasal sinuses are clear. Bilateral lens replacement. Orbits are otherwise unremarkable. Other: None. CT CERVICAL SPINE FINDINGS Alignment: Grade 1 anterolisthesis of C5 on C6 and C7 on T1. Skull base and vertebrae: No acute fracture. No primary bone lesion or focal pathologic process. Soft tissues and spinal canal: No prevertebral fluid or swelling. No visible canal hematoma. Disc levels:  No evidence of high-grade spinal canal stenosis. Upper chest: Biapical emphysema Other: None IMPRESSION: 1. No acute intracranial abnormality. Sequela of severe chronic microvascular ischemic change. 2. No acute fracture or traumatic malalignment of the cervical spine. Electronically Signed   By: Lorenza Cambridge M.D.   On: 11/27/2022 09:23    Labs on Admission: I have personally reviewed following labs  CBC: Recent Labs  Lab 11/27/22 0917  WBC 12.2*  NEUTROABS 10.2*  HGB 9.5*  HCT 31.8*  MCV 86.4  PLT 379   Basic Metabolic  Panel: Recent Labs  Lab 11/27/22 0917  NA 137  K 4.1  CL 95*  CO2 27  GLUCOSE 138*  BUN 121*  CREATININE 2.64*  CALCIUM 11.8*   GFR: Estimated Creatinine Clearance: 11.7 mL/min (A) (by C-G formula based on SCr of 2.64 mg/dL (H)).  Liver Function Tests: Recent Labs  Lab 11/27/22 0917  AST 17  ALT 10  ALKPHOS 50  BILITOT 0.7  PROT 7.3  ALBUMIN 3.3*   Recent Labs  Lab 11/27/22 0917  LIPASE 33   Cardiac Enzymes: Recent Labs  Lab 11/27/22 [phone]  CKTOTAL 74   Thyroid Function Tests: Recent Labs    11/27/22 0917  TSH 1.287   Urine analysis:    Component Value Date/Time   COLORURINE STRAW (A) 11/27/2022 0817   APPEARANCEUR CLEAR (A) 11/27/2022 0817   LABSPEC 1.010 11/27/2022 0817   PHURINE 5.0 11/27/2022 0817   GLUCOSEU NEGATIVE 11/27/2022 0817   HGBUR NEGATIVE 11/27/2022 0817   BILIRUBINUR NEGATIVE 11/27/2022 0817   KETONESUR NEGATIVE 11/27/2022 0817   PROTEINUR NEGATIVE 11/27/2022 0817   NITRITE POSITIVE (A) 11/27/2022 0817   LEUKOCYTESUR MODERATE (A) 11/27/2022 0817   This document was prepared using Dragon Voice Recognition software and may include unintentional dictation errors.  Dr. Sedalia Muta [facility]  If 7PM-7AM, please contact overnight-coverage provider If 7AM-7PM, please contact day attending provider [email or web address]  11/27/2022, 8:22 PM

## 2022-11-27 NOTE — Hospital Course (Signed)
Ms. Toshua Honsinger is a 89 year old female with history of dementia, history of COPD, CKD 3B, GERD, insomnia, hyperlipidemia, depression, anxiety, hypertension, who presents to the emergency department for chief concerns of increasing fall and altered mental status.  Vitals in the ED showed temperature 98.3, respiration rate of 18, heart rate 70, blood pressure 124/104, SpO2 100% on 2 L nasal cannula.  Serum sodium is 137, potassium 4.1, chloride 95, bicarb 27, BUN of 121, serum creatinine 2.64, EGFR 17, nonfasting blood glucose 138, WBC 12.2, hemoglobin 9.5, platelets of 379.  CK is 174.  High sensitive troponin is 55 and on repeat is 70.  UA with positive for moderate leukocytes and positive for nitrates.  ED treatment: LR 1 L bolus, sodium chloride 1 L bolus, ceftriaxone 2 g IV one-time dose.

## 2022-11-27 NOTE — Plan of Care (Signed)

## 2022-11-28 ENCOUNTER — Inpatient Hospital Stay: Payer: Medicare (Managed Care)

## 2022-11-28 ENCOUNTER — Encounter: Payer: Self-pay | Admitting: Internal Medicine

## 2022-11-28 ENCOUNTER — Inpatient Hospital Stay (HOSPITAL_COMMUNITY)
Admit: 2022-11-28 | Discharge: 2022-11-28 | Disposition: A | Discharge status: Home / Self Care | Payer: Medicare (Managed Care) | Attending: Internal Medicine | Admitting: Internal Medicine

## 2022-11-28 DIAGNOSIS — D638 Anemia in other chronic diseases classified elsewhere: Secondary | ICD-10-CM | POA: Diagnosis not present

## 2022-11-28 DIAGNOSIS — N3090 Cystitis, unspecified without hematuria: Secondary | ICD-10-CM | POA: Diagnosis not present

## 2022-11-28 DIAGNOSIS — I25118 Atherosclerotic heart disease of native coronary artery with other forms of angina pectoris: Secondary | ICD-10-CM

## 2022-11-28 DIAGNOSIS — W19XXXA Unspecified fall, initial encounter: Secondary | ICD-10-CM

## 2022-11-28 DIAGNOSIS — N3000 Acute cystitis without hematuria: Secondary | ICD-10-CM | POA: Diagnosis not present

## 2022-11-28 DIAGNOSIS — F0390 Unspecified dementia without behavioral disturbance: Secondary | ICD-10-CM

## 2022-11-28 DIAGNOSIS — I2510 Atherosclerotic heart disease of native coronary artery without angina pectoris: Secondary | ICD-10-CM

## 2022-11-28 DIAGNOSIS — R7989 Other specified abnormal findings of blood chemistry: Secondary | ICD-10-CM

## 2022-11-28 DIAGNOSIS — N183 Chronic kidney disease, stage 3 unspecified: Secondary | ICD-10-CM

## 2022-11-28 DIAGNOSIS — R296 Repeated falls: Secondary | ICD-10-CM

## 2022-11-28 DIAGNOSIS — E43 Unspecified severe protein-calorie malnutrition: Secondary | ICD-10-CM

## 2022-11-28 DIAGNOSIS — J9611 Chronic respiratory failure with hypoxia: Secondary | ICD-10-CM

## 2022-11-28 DIAGNOSIS — N170 Acute kidney failure with tubular necrosis: Secondary | ICD-10-CM | POA: Diagnosis not present

## 2022-11-28 DIAGNOSIS — Y92009 Unspecified place in unspecified non-institutional (private) residence as the place of occurrence of the external cause: Secondary | ICD-10-CM

## 2022-11-28 DIAGNOSIS — N179 Acute kidney failure, unspecified: Secondary | ICD-10-CM | POA: Diagnosis not present

## 2022-11-28 HISTORY — DX: Unspecified dementia, unspecified severity, without behavioral disturbance, psychotic disturbance, mood disturbance, and anxiety: F03.90

## 2022-11-28 LAB — TROPONIN I (HIGH SENSITIVITY)
Troponin I (High Sensitivity): 66 ng/L — ABNORMAL HIGH (ref <18)
Troponin I (High Sensitivity): 72 ng/L — ABNORMAL HIGH (ref <18)

## 2022-11-28 LAB — ECHOCARDIOGRAM COMPLETE
AR max vel: 0.91 cm2
AV Area VTI: 1.07 cm2
AV Area mean vel: 0.88 cm2
AV Mean grad: 10.3 mm[Hg]
AV Peak grad: 17.6 mm[Hg]
Ao pk vel: 2.1 m/s
Area-P 1/2: 3.46 cm2
Height: 61 in
S' Lateral: 3.3 cm
Weight: 2177.6 [oz_av]

## 2022-11-28 LAB — CBC
HCT: 29.4 % — ABNORMAL LOW (ref 36.0–46.0)
Hemoglobin: 8.9 g/dL — ABNORMAL LOW (ref 12.0–15.0)
MCH: 25.9 pg — ABNORMAL LOW (ref 26.0–34.0)
MCHC: 30.3 g/dL (ref 30.0–36.0)
MCV: 85.7 fL (ref 80.0–100.0)
Platelets: 334 10*3/uL (ref 150–400)
RBC: 3.43 MIL/uL — ABNORMAL LOW (ref 3.87–5.11)
RDW: 19.8 % — ABNORMAL HIGH (ref 11.5–15.5)
WBC: 9.3 10*3/uL (ref 4.0–10.5)
nRBC: 0 % (ref 0.0–0.2)

## 2022-11-28 LAB — BASIC METABOLIC PANEL
Anion gap: 13 (ref 5–15)
BUN: 111 mg/dL — ABNORMAL HIGH (ref 8–23)
CO2: 27 mmol/L (ref 22–32)
Calcium: 11 mg/dL — ABNORMAL HIGH (ref 8.9–10.3)
Chloride: 98 mmol/L (ref 98–111)
Creatinine, Ser: 2.24 mg/dL — ABNORMAL HIGH (ref 0.44–1.00)
GFR, Estimated: 20 mL/min — ABNORMAL LOW (ref >60)
Glucose, Bld: 98 mg/dL (ref 70–99)
Potassium: 3.7 mmol/L (ref 3.5–5.1)
Sodium: 138 mmol/L (ref 135–145)

## 2022-11-28 MED ORDER — ENSURE ENLIVE PO LIQD
237.0000 mL | Freq: Three times a day (TID) | ORAL | Status: DC
  Administered 2022-11-28 – 2022-12-02 (×13): 237 mL via ORAL

## 2022-11-28 MED ORDER — ADULT MULTIVITAMIN W/MINERALS CH
1.0000 | ORAL_TABLET | Freq: Every day | ORAL | Status: DC
  Administered 2022-11-28 – 2022-12-02 (×5): 1 via ORAL
  Filled 2022-11-28 (×5): qty 1

## 2022-11-28 NOTE — Consult Note (Addendum)
Cardiology Consultation   Patient ID: Pheonix Wisby MRN: [id number]; DOB: [birth year]  Admit date: 11/27/2022 Date of Consult: 11/28/2022  PCP:  [facility]   [facility] Providers Cardiologist:  Sherryl Manges, MD  Electrophysiologist:  Sherryl Manges, MD       Patient Profile:   Joyce Heitman is a 89 y.o. 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  History of Present Illness:   Ms. Olvera has longstanding history of coronary artery disease status post prior LAD and RCA stenting (1995 in 2011 respectively with originally bare-metal stent and then DES), sick sinus syndrome status post [facility] permanent pacemaker 2014 with last generator change in 2023, persistent atrial fibrillation not on anticoagulation secondary to prior history of bleeding and chronic history of anemia, heart failure with midrange EF (45 to 20%), ICM, anemia, diabetes, hypertension, hyperlipidemia, chronic hypoxic respiratory failure on home O2, COPD, GERD. Echocardiogram done during admission in 2021 revealed an LVEF of 45-50%.  Since that hospitalization she has been followed by EP.  She was admitted to [facility] 04/2021 with a sudden complaint of chest pain midline back pain radiating to her front chest and intermittently down her right arm.  Patient states she feels different than her chronic upper back pain.  She presented in 10/11/2021 to the [facility] emergency department, this was after she presented to the ED after a fall at home and EMS was notified via life alert where she was  found down on the kitchen floor, initially incoherent but became more lucid.  She was noted to be bradycardic with heart rate in the 40s.  She was found to be hypotensive in the ED with a MAP of 50 received IV fluids and no improvement.  Initially had to be started on IV pressors.  Urine was concerning for UTI and she was placed on IV antibiotics.  Cardiology was consulted and 2D echocardiogram was reviewed and found nothing was to be added from a cardiology standpoint.  EP was consulted and it was brought to their attention by the [facility] [employer] rep and interrogated the patient's device earlier in the admission and noted that she has not met the EOS and was recommended for generator change prior to discharge.  She underwent generator change on 9/6 by Dr. Graciela Husbands.  He was considered stable for discharge and was subsequently discharged from the facility on 10/18/2021.  She was then reevaluated in the [facility] emergency department on 12/2021 with complaints of chest discomfort that was centralized chest pressure that began this morning.  Patient was wearing 3 L of oxygen at baseline.  She was given 324 mg of aspirin, 1 nitroglycerin, and started on 1 inch of Nitropaste prior to arrival.  Blood pressure was slightly elevated at 149/58.  She was found to be hypokalemic with potassium 3.1, chloride 96, blood glucose 150, BUN of 77 serum creatinine of 1.57, high-sensitivity troponin remained flat at 25 with a hemoglobin was noted to be 8.4.  Device was interrogated with no events over the past 72 hours.  CT of the chest abdomen and pelvis was nonrevealing.  She was subsequently discharged home on antibiotic therapy  RBC 3.68* 3.43*  HGB 9.5* 8.9*  HCT 31.8* 29.4*  MCV 86.4 85.7  MCH 25.8* 25.9*  MCHC 29.9* 30.3  RDW 19.2* 19.8*  PLT 379 334   Thyroid  Recent Labs  Lab 11/27/22 0917  TSH 1.287    BNPNo results for input(s): "BNP", "PROBNP" in the last 168 hours.  DDimer No results for input(s): "DDIMER" in the last 168 hours.   Radiology/Studies:  ECHOCARDIOGRAM COMPLETE  Result Date: 11/28/2022    ECHOCARDIOGRAM REPORT   Patient Name:   BRUNETTA NEWINGHAM Date of Exam: 11/28/2022 Medical Rec #:  [id number]     Height:       61.0 in Accession #:    [id number]    Weight:       136.1 lb Date of Birth:  [birth year]      BSA:          1.604 m Patient Age:    89 years      BP:           114/42 mmHg Patient Gender: F             HR:           70 bpm. Exam Location:  [facility] Procedure: 2D Echo, Cardiac Doppler and Color Doppler Indications:     Elevated Troponin  History:         Patient has prior history of Echocardiogram examinations, most                  recent 10/12/2021. CHF, CAD; Risk Factors:Hypertension and                  Diabetes.  Sonographer:     Cristela Blue Referring Phys:  [id number] AMY N [facility] Diagnosing Phys: Julien Nordmann MD  Sonographer Comments: Suboptimal apical window. IMPRESSIONS  1. Left ventricular ejection fraction, by estimation, is 45 to 50%. The left ventricle has mildly decreased function. The left ventricle demonstrates regional wall motion abnormalities (anterior/anteroseptal  wall hypokinesis). There is mild left ventricular hypertrophy. Left ventricular diastolic parameters are indeterminate.  2. Right ventricular systolic function is moderately reduced. The right ventricular size is normal.  3. Left atrial size was moderately dilated.  4. Right atrial size was mildly dilated.  5. The mitral valve is normal in structure. Moderate mitral valve regurgitation. No evidence of mitral stenosis. Severe mitral annular calcification.  6. The aortic valve is normal in structure. There is moderate calcification of the aortic valve. Aortic valve regurgitation is mild. Mild aortic valve stenosis. Aortic valve mean gradient measures 10.3 mmHg.  7. The inferior vena cava is normal in size with greater than 50% respiratory variability, suggesting right atrial pressure of 3 mmHg. FINDINGS  Left Ventricle: Left ventricular ejection fraction, by estimation, is 45 to 50%. The left ventricle has mildly decreased function. The left ventricle demonstrates regional wall motion abnormalities. The left ventricular internal cavity size was normal in size. There is mild left ventricular hypertrophy. Left ventricular diastolic parameters are indeterminate. Right Ventricle: The right ventricular size is normal. No increase in right ventricular wall thickness. Right ventricular systolic function is moderately reduced. Left Atrium: Left atrial size was moderately dilated. Right Atrium: Right atrial size was mildly dilated. Pericardium: There is no evidence of pericardial effusion. Mitral Valve: The mitral valve is normal in structure. There is mild calcification of the mitral valve leaflet(s). Severe mitral annular calcification. Moderate mitral valve regurgitation, with eccentric posteriorly directed jet. No evidence of mitral valve stenosis.  RBC 3.68* 3.43*  HGB 9.5* 8.9*  HCT 31.8* 29.4*  MCV 86.4 85.7  MCH 25.8* 25.9*  MCHC 29.9* 30.3  RDW 19.2* 19.8*  PLT 379 334   Thyroid  Recent Labs  Lab 11/27/22 0917  TSH 1.287    BNPNo results for input(s): "BNP", "PROBNP" in the last 168 hours.  DDimer No results for input(s): "DDIMER" in the last 168 hours.   Radiology/Studies:  ECHOCARDIOGRAM COMPLETE  Result Date: 11/28/2022    ECHOCARDIOGRAM REPORT   Patient Name:   BRUNETTA NEWINGHAM Date of Exam: 11/28/2022 Medical Rec #:  [id number]     Height:       61.0 in Accession #:    [id number]    Weight:       136.1 lb Date of Birth:  [birth year]      BSA:          1.604 m Patient Age:    89 years      BP:           114/42 mmHg Patient Gender: F             HR:           70 bpm. Exam Location:  [facility] Procedure: 2D Echo, Cardiac Doppler and Color Doppler Indications:     Elevated Troponin  History:         Patient has prior history of Echocardiogram examinations, most                  recent 10/12/2021. CHF, CAD; Risk Factors:Hypertension and                  Diabetes.  Sonographer:     Cristela Blue Referring Phys:  [id number] AMY N [facility] Diagnosing Phys: Julien Nordmann MD  Sonographer Comments: Suboptimal apical window. IMPRESSIONS  1. Left ventricular ejection fraction, by estimation, is 45 to 50%. The left ventricle has mildly decreased function. The left ventricle demonstrates regional wall motion abnormalities (anterior/anteroseptal  wall hypokinesis). There is mild left ventricular hypertrophy. Left ventricular diastolic parameters are indeterminate.  2. Right ventricular systolic function is moderately reduced. The right ventricular size is normal.  3. Left atrial size was moderately dilated.  4. Right atrial size was mildly dilated.  5. The mitral valve is normal in structure. Moderate mitral valve regurgitation. No evidence of mitral stenosis. Severe mitral annular calcification.  6. The aortic valve is normal in structure. There is moderate calcification of the aortic valve. Aortic valve regurgitation is mild. Mild aortic valve stenosis. Aortic valve mean gradient measures 10.3 mmHg.  7. The inferior vena cava is normal in size with greater than 50% respiratory variability, suggesting right atrial pressure of 3 mmHg. FINDINGS  Left Ventricle: Left ventricular ejection fraction, by estimation, is 45 to 50%. The left ventricle has mildly decreased function. The left ventricle demonstrates regional wall motion abnormalities. The left ventricular internal cavity size was normal in size. There is mild left ventricular hypertrophy. Left ventricular diastolic parameters are indeterminate. Right Ventricle: The right ventricular size is normal. No increase in right ventricular wall thickness. Right ventricular systolic function is moderately reduced. Left Atrium: Left atrial size was moderately dilated. Right Atrium: Right atrial size was mildly dilated. Pericardium: There is no evidence of pericardial effusion. Mitral Valve: The mitral valve is normal in structure. There is mild calcification of the mitral valve leaflet(s). Severe mitral annular calcification. Moderate mitral valve regurgitation, with eccentric posteriorly directed jet. No evidence of mitral valve stenosis.  RBC 3.68* 3.43*  HGB 9.5* 8.9*  HCT 31.8* 29.4*  MCV 86.4 85.7  MCH 25.8* 25.9*  MCHC 29.9* 30.3  RDW 19.2* 19.8*  PLT 379 334   Thyroid  Recent Labs  Lab 11/27/22 0917  TSH 1.287    BNPNo results for input(s): "BNP", "PROBNP" in the last 168 hours.  DDimer No results for input(s): "DDIMER" in the last 168 hours.   Radiology/Studies:  ECHOCARDIOGRAM COMPLETE  Result Date: 11/28/2022    ECHOCARDIOGRAM REPORT   Patient Name:   BRUNETTA NEWINGHAM Date of Exam: 11/28/2022 Medical Rec #:  [id number]     Height:       61.0 in Accession #:    [id number]    Weight:       136.1 lb Date of Birth:  [birth year]      BSA:          1.604 m Patient Age:    89 years      BP:           114/42 mmHg Patient Gender: F             HR:           70 bpm. Exam Location:  [facility] Procedure: 2D Echo, Cardiac Doppler and Color Doppler Indications:     Elevated Troponin  History:         Patient has prior history of Echocardiogram examinations, most                  recent 10/12/2021. CHF, CAD; Risk Factors:Hypertension and                  Diabetes.  Sonographer:     Cristela Blue Referring Phys:  [id number] AMY N [facility] Diagnosing Phys: Julien Nordmann MD  Sonographer Comments: Suboptimal apical window. IMPRESSIONS  1. Left ventricular ejection fraction, by estimation, is 45 to 50%. The left ventricle has mildly decreased function. The left ventricle demonstrates regional wall motion abnormalities (anterior/anteroseptal  wall hypokinesis). There is mild left ventricular hypertrophy. Left ventricular diastolic parameters are indeterminate.  2. Right ventricular systolic function is moderately reduced. The right ventricular size is normal.  3. Left atrial size was moderately dilated.  4. Right atrial size was mildly dilated.  5. The mitral valve is normal in structure. Moderate mitral valve regurgitation. No evidence of mitral stenosis. Severe mitral annular calcification.  6. The aortic valve is normal in structure. There is moderate calcification of the aortic valve. Aortic valve regurgitation is mild. Mild aortic valve stenosis. Aortic valve mean gradient measures 10.3 mmHg.  7. The inferior vena cava is normal in size with greater than 50% respiratory variability, suggesting right atrial pressure of 3 mmHg. FINDINGS  Left Ventricle: Left ventricular ejection fraction, by estimation, is 45 to 50%. The left ventricle has mildly decreased function. The left ventricle demonstrates regional wall motion abnormalities. The left ventricular internal cavity size was normal in size. There is mild left ventricular hypertrophy. Left ventricular diastolic parameters are indeterminate. Right Ventricle: The right ventricular size is normal. No increase in right ventricular wall thickness. Right ventricular systolic function is moderately reduced. Left Atrium: Left atrial size was moderately dilated. Right Atrium: Right atrial size was mildly dilated. Pericardium: There is no evidence of pericardial effusion. Mitral Valve: The mitral valve is normal in structure. There is mild calcification of the mitral valve leaflet(s). Severe mitral annular calcification. Moderate mitral valve regurgitation, with eccentric posteriorly directed jet. No evidence of mitral valve stenosis.  Cardiology Consultation   Patient ID: Pheonix Wisby MRN: [id number]; DOB: [birth year]  Admit date: 11/27/2022 Date of Consult: 11/28/2022  PCP:  [facility]   [facility] Providers Cardiologist:  Sherryl Manges, MD  Electrophysiologist:  Sherryl Manges, MD       Patient Profile:   Joyce Heitman is a 89 y.o. 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  History of Present Illness:   Ms. Olvera has longstanding history of coronary artery disease status post prior LAD and RCA stenting (1995 in 2011 respectively with originally bare-metal stent and then DES), sick sinus syndrome status post [facility] permanent pacemaker 2014 with last generator change in 2023, persistent atrial fibrillation not on anticoagulation secondary to prior history of bleeding and chronic history of anemia, heart failure with midrange EF (45 to 20%), ICM, anemia, diabetes, hypertension, hyperlipidemia, chronic hypoxic respiratory failure on home O2, COPD, GERD. Echocardiogram done during admission in 2021 revealed an LVEF of 45-50%.  Since that hospitalization she has been followed by EP.  She was admitted to [facility] 04/2021 with a sudden complaint of chest pain midline back pain radiating to her front chest and intermittently down her right arm.  Patient states she feels different than her chronic upper back pain.  She presented in 10/11/2021 to the [facility] emergency department, this was after she presented to the ED after a fall at home and EMS was notified via life alert where she was  found down on the kitchen floor, initially incoherent but became more lucid.  She was noted to be bradycardic with heart rate in the 40s.  She was found to be hypotensive in the ED with a MAP of 50 received IV fluids and no improvement.  Initially had to be started on IV pressors.  Urine was concerning for UTI and she was placed on IV antibiotics.  Cardiology was consulted and 2D echocardiogram was reviewed and found nothing was to be added from a cardiology standpoint.  EP was consulted and it was brought to their attention by the [facility] [employer] rep and interrogated the patient's device earlier in the admission and noted that she has not met the EOS and was recommended for generator change prior to discharge.  She underwent generator change on 9/6 by Dr. Graciela Husbands.  He was considered stable for discharge and was subsequently discharged from the facility on 10/18/2021.  She was then reevaluated in the [facility] emergency department on 12/2021 with complaints of chest discomfort that was centralized chest pressure that began this morning.  Patient was wearing 3 L of oxygen at baseline.  She was given 324 mg of aspirin, 1 nitroglycerin, and started on 1 inch of Nitropaste prior to arrival.  Blood pressure was slightly elevated at 149/58.  She was found to be hypokalemic with potassium 3.1, chloride 96, blood glucose 150, BUN of 77 serum creatinine of 1.57, high-sensitivity troponin remained flat at 25 with a hemoglobin was noted to be 8.4.  Device was interrogated with no events over the past 72 hours.  CT of the chest abdomen and pelvis was nonrevealing.  She was subsequently discharged home on antibiotic therapy  RBC 3.68* 3.43*  HGB 9.5* 8.9*  HCT 31.8* 29.4*  MCV 86.4 85.7  MCH 25.8* 25.9*  MCHC 29.9* 30.3  RDW 19.2* 19.8*  PLT 379 334   Thyroid  Recent Labs  Lab 11/27/22 0917  TSH 1.287    BNPNo results for input(s): "BNP", "PROBNP" in the last 168 hours.  DDimer No results for input(s): "DDIMER" in the last 168 hours.   Radiology/Studies:  ECHOCARDIOGRAM COMPLETE  Result Date: 11/28/2022    ECHOCARDIOGRAM REPORT   Patient Name:   BRUNETTA NEWINGHAM Date of Exam: 11/28/2022 Medical Rec #:  [id number]     Height:       61.0 in Accession #:    [id number]    Weight:       136.1 lb Date of Birth:  [birth year]      BSA:          1.604 m Patient Age:    89 years      BP:           114/42 mmHg Patient Gender: F             HR:           70 bpm. Exam Location:  [facility] Procedure: 2D Echo, Cardiac Doppler and Color Doppler Indications:     Elevated Troponin  History:         Patient has prior history of Echocardiogram examinations, most                  recent 10/12/2021. CHF, CAD; Risk Factors:Hypertension and                  Diabetes.  Sonographer:     Cristela Blue Referring Phys:  [id number] AMY N [facility] Diagnosing Phys: Julien Nordmann MD  Sonographer Comments: Suboptimal apical window. IMPRESSIONS  1. Left ventricular ejection fraction, by estimation, is 45 to 50%. The left ventricle has mildly decreased function. The left ventricle demonstrates regional wall motion abnormalities (anterior/anteroseptal  wall hypokinesis). There is mild left ventricular hypertrophy. Left ventricular diastolic parameters are indeterminate.  2. Right ventricular systolic function is moderately reduced. The right ventricular size is normal.  3. Left atrial size was moderately dilated.  4. Right atrial size was mildly dilated.  5. The mitral valve is normal in structure. Moderate mitral valve regurgitation. No evidence of mitral stenosis. Severe mitral annular calcification.  6. The aortic valve is normal in structure. There is moderate calcification of the aortic valve. Aortic valve regurgitation is mild. Mild aortic valve stenosis. Aortic valve mean gradient measures 10.3 mmHg.  7. The inferior vena cava is normal in size with greater than 50% respiratory variability, suggesting right atrial pressure of 3 mmHg. FINDINGS  Left Ventricle: Left ventricular ejection fraction, by estimation, is 45 to 50%. The left ventricle has mildly decreased function. The left ventricle demonstrates regional wall motion abnormalities. The left ventricular internal cavity size was normal in size. There is mild left ventricular hypertrophy. Left ventricular diastolic parameters are indeterminate. Right Ventricle: The right ventricular size is normal. No increase in right ventricular wall thickness. Right ventricular systolic function is moderately reduced. Left Atrium: Left atrial size was moderately dilated. Right Atrium: Right atrial size was mildly dilated. Pericardium: There is no evidence of pericardial effusion. Mitral Valve: The mitral valve is normal in structure. There is mild calcification of the mitral valve leaflet(s). Severe mitral annular calcification. Moderate mitral valve regurgitation, with eccentric posteriorly directed jet. No evidence of mitral valve stenosis.  Cardiology Consultation   Patient ID: Pheonix Wisby MRN: [id number]; DOB: [birth year]  Admit date: 11/27/2022 Date of Consult: 11/28/2022  PCP:  [employer]   [facility] Providers Cardiologist:  Sherryl Manges, MD  Electrophysiologist:  Sherryl Manges, MD       Patient Profile:   Joyce Heitman is a 89 y.o. 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  History of Present Illness:   Ms. Olvera has longstanding history of coronary artery disease status post prior LAD and RCA stenting (1995 in 2011 respectively with originally bare-metal stent and then DES), sick sinus syndrome status post [facility] permanent pacemaker 2014 with last generator change in 2023, persistent atrial fibrillation not on anticoagulation secondary to prior history of bleeding and chronic history of anemia, heart failure with midrange EF (45 to 20%), ICM, anemia, diabetes, hypertension, hyperlipidemia, chronic hypoxic respiratory failure on home O2, COPD, GERD. Echocardiogram done during admission in 2021 revealed an LVEF of 45-50%.  Since that hospitalization she has been followed by EP.  She was admitted to [facility] 04/2021 with a sudden complaint of chest pain midline back pain radiating to her front chest and intermittently down her right arm.  Patient states she feels different than her chronic upper back pain.  She presented in 10/11/2021 to the [facility] emergency department, this was after she presented to the ED after a fall at home and EMS was notified via life alert where she was  found down on the kitchen floor, initially incoherent but became more lucid.  She was noted to be bradycardic with heart rate in the 40s.  She was found to be hypotensive in the ED with a MAP of 50 received IV fluids and no improvement.  Initially had to be started on IV pressors.  Urine was concerning for UTI and she was placed on IV antibiotics.  Cardiology was consulted and 2D echocardiogram was reviewed and found nothing was to be added from a cardiology standpoint.  EP was consulted and it was brought to their attention by the [facility] [employer] rep and interrogated the patient's device earlier in the admission and noted that she has not met the EOS and was recommended for generator change prior to discharge.  She underwent generator change on 9/6 by Dr. Graciela Husbands.  He was considered stable for discharge and was subsequently discharged from the facility on 10/18/2021.  She was then reevaluated in the [facility] emergency department on 12/2021 with complaints of chest discomfort that was centralized chest pressure that began this morning.  Patient was wearing 3 L of oxygen at baseline.  She was given 324 mg of aspirin, 1 nitroglycerin, and started on 1 inch of Nitropaste prior to arrival.  Blood pressure was slightly elevated at 149/58.  She was found to be hypokalemic with potassium 3.1, chloride 96, blood glucose 150, BUN of 77 serum creatinine of 1.57, high-sensitivity troponin remained flat at 25 with a hemoglobin was noted to be 8.4.  Device was interrogated with no events over the past 72 hours.  CT of the chest abdomen and pelvis was nonrevealing.  She was subsequently discharged home on antibiotic therapy  Cardiology Consultation   Patient ID: Pheonix Wisby MRN: [id number]; DOB: [birth year]  Admit date: 11/27/2022 Date of Consult: 11/28/2022  PCP:  [employer]   [facility] Providers Cardiologist:  Sherryl Manges, MD  Electrophysiologist:  Sherryl Manges, MD       Patient Profile:   Joyce Heitman is a 89 y.o. 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  History of Present Illness:   Ms. Olvera has longstanding history of coronary artery disease status post prior LAD and RCA stenting (1995 in 2011 respectively with originally bare-metal stent and then DES), sick sinus syndrome status post [facility] permanent pacemaker 2014 with last generator change in 2023, persistent atrial fibrillation not on anticoagulation secondary to prior history of bleeding and chronic history of anemia, heart failure with midrange EF (45 to 20%), ICM, anemia, diabetes, hypertension, hyperlipidemia, chronic hypoxic respiratory failure on home O2, COPD, GERD. Echocardiogram done during admission in 2021 revealed an LVEF of 45-50%.  Since that hospitalization she has been followed by EP.  She was admitted to [facility] 04/2021 with a sudden complaint of chest pain midline back pain radiating to her front chest and intermittently down her right arm.  Patient states she feels different than her chronic upper back pain.  She presented in 10/11/2021 to the [facility] emergency department, this was after she presented to the ED after a fall at home and EMS was notified via life alert where she was  found down on the kitchen floor, initially incoherent but became more lucid.  She was noted to be bradycardic with heart rate in the 40s.  She was found to be hypotensive in the ED with a MAP of 50 received IV fluids and no improvement.  Initially had to be started on IV pressors.  Urine was concerning for UTI and she was placed on IV antibiotics.  Cardiology was consulted and 2D echocardiogram was reviewed and found nothing was to be added from a cardiology standpoint.  EP was consulted and it was brought to their attention by the [facility] [employer] rep and interrogated the patient's device earlier in the admission and noted that she has not met the EOS and was recommended for generator change prior to discharge.  She underwent generator change on 9/6 by Dr. Graciela Husbands.  He was considered stable for discharge and was subsequently discharged from the facility on 10/18/2021.  She was then reevaluated in the [facility] emergency department on 12/2021 with complaints of chest discomfort that was centralized chest pressure that began this morning.  Patient was wearing 3 L of oxygen at baseline.  She was given 324 mg of aspirin, 1 nitroglycerin, and started on 1 inch of Nitropaste prior to arrival.  Blood pressure was slightly elevated at 149/58.  She was found to be hypokalemic with potassium 3.1, chloride 96, blood glucose 150, BUN of 77 serum creatinine of 1.57, high-sensitivity troponin remained flat at 25 with a hemoglobin was noted to be 8.4.  Device was interrogated with no events over the past 72 hours.  CT of the chest abdomen and pelvis was nonrevealing.  She was subsequently discharged home on antibiotic therapy  RBC 3.68* 3.43*  HGB 9.5* 8.9*  HCT 31.8* 29.4*  MCV 86.4 85.7  MCH 25.8* 25.9*  MCHC 29.9* 30.3  RDW 19.2* 19.8*  PLT 379 334   Thyroid  Recent Labs  Lab 11/27/22 0917  TSH 1.287    BNPNo results for input(s): "BNP", "PROBNP" in the last 168 hours.  DDimer No results for input(s): "DDIMER" in the last 168 hours.   Radiology/Studies:  ECHOCARDIOGRAM COMPLETE  Result Date: 11/28/2022    ECHOCARDIOGRAM REPORT   Patient Name:   BRUNETTA NEWINGHAM Date of Exam: 11/28/2022 Medical Rec #:  [id number]     Height:       61.0 in Accession #:    [id number]    Weight:       136.1 lb Date of Birth:  [birth year]      BSA:          1.604 m Patient Age:    89 years      BP:           114/42 mmHg Patient Gender: F             HR:           70 bpm. Exam Location:  [facility] Procedure: 2D Echo, Cardiac Doppler and Color Doppler Indications:     Elevated Troponin  History:         Patient has prior history of Echocardiogram examinations, most                  recent 10/12/2021. CHF, CAD; Risk Factors:Hypertension and                  Diabetes.  Sonographer:     Cristela Blue Referring Phys:  [id number] AMY N [facility] Diagnosing Phys: Julien Nordmann MD  Sonographer Comments: Suboptimal apical window. IMPRESSIONS  1. Left ventricular ejection fraction, by estimation, is 45 to 50%. The left ventricle has mildly decreased function. The left ventricle demonstrates regional wall motion abnormalities (anterior/anteroseptal  wall hypokinesis). There is mild left ventricular hypertrophy. Left ventricular diastolic parameters are indeterminate.  2. Right ventricular systolic function is moderately reduced. The right ventricular size is normal.  3. Left atrial size was moderately dilated.  4. Right atrial size was mildly dilated.  5. The mitral valve is normal in structure. Moderate mitral valve regurgitation. No evidence of mitral stenosis. Severe mitral annular calcification.  6. The aortic valve is normal in structure. There is moderate calcification of the aortic valve. Aortic valve regurgitation is mild. Mild aortic valve stenosis. Aortic valve mean gradient measures 10.3 mmHg.  7. The inferior vena cava is normal in size with greater than 50% respiratory variability, suggesting right atrial pressure of 3 mmHg. FINDINGS  Left Ventricle: Left ventricular ejection fraction, by estimation, is 45 to 50%. The left ventricle has mildly decreased function. The left ventricle demonstrates regional wall motion abnormalities. The left ventricular internal cavity size was normal in size. There is mild left ventricular hypertrophy. Left ventricular diastolic parameters are indeterminate. Right Ventricle: The right ventricular size is normal. No increase in right ventricular wall thickness. Right ventricular systolic function is moderately reduced. Left Atrium: Left atrial size was moderately dilated. Right Atrium: Right atrial size was mildly dilated. Pericardium: There is no evidence of pericardial effusion. Mitral Valve: The mitral valve is normal in structure. There is mild calcification of the mitral valve leaflet(s). Severe mitral annular calcification. Moderate mitral valve regurgitation, with eccentric posteriorly directed jet. No evidence of mitral valve stenosis.  RBC 3.68* 3.43*  HGB 9.5* 8.9*  HCT 31.8* 29.4*  MCV 86.4 85.7  MCH 25.8* 25.9*  MCHC 29.9* 30.3  RDW 19.2* 19.8*  PLT 379 334   Thyroid  Recent Labs  Lab 11/27/22 0917  TSH 1.287    BNPNo results for input(s): "BNP", "PROBNP" in the last 168 hours.  DDimer No results for input(s): "DDIMER" in the last 168 hours.   Radiology/Studies:  ECHOCARDIOGRAM COMPLETE  Result Date: 11/28/2022    ECHOCARDIOGRAM REPORT   Patient Name:   BRUNETTA NEWINGHAM Date of Exam: 11/28/2022 Medical Rec #:  [id number]     Height:       61.0 in Accession #:    [id number]    Weight:       136.1 lb Date of Birth:  [birth year]      BSA:          1.604 m Patient Age:    89 years      BP:           114/42 mmHg Patient Gender: F             HR:           70 bpm. Exam Location:  [facility] Procedure: 2D Echo, Cardiac Doppler and Color Doppler Indications:     Elevated Troponin  History:         Patient has prior history of Echocardiogram examinations, most                  recent 10/12/2021. CHF, CAD; Risk Factors:Hypertension and                  Diabetes.  Sonographer:     Cristela Blue Referring Phys:  [id number] AMY N [facility] Diagnosing Phys: Julien Nordmann MD  Sonographer Comments: Suboptimal apical window. IMPRESSIONS  1. Left ventricular ejection fraction, by estimation, is 45 to 50%. The left ventricle has mildly decreased function. The left ventricle demonstrates regional wall motion abnormalities (anterior/anteroseptal  wall hypokinesis). There is mild left ventricular hypertrophy. Left ventricular diastolic parameters are indeterminate.  2. Right ventricular systolic function is moderately reduced. The right ventricular size is normal.  3. Left atrial size was moderately dilated.  4. Right atrial size was mildly dilated.  5. The mitral valve is normal in structure. Moderate mitral valve regurgitation. No evidence of mitral stenosis. Severe mitral annular calcification.  6. The aortic valve is normal in structure. There is moderate calcification of the aortic valve. Aortic valve regurgitation is mild. Mild aortic valve stenosis. Aortic valve mean gradient measures 10.3 mmHg.  7. The inferior vena cava is normal in size with greater than 50% respiratory variability, suggesting right atrial pressure of 3 mmHg. FINDINGS  Left Ventricle: Left ventricular ejection fraction, by estimation, is 45 to 50%. The left ventricle has mildly decreased function. The left ventricle demonstrates regional wall motion abnormalities. The left ventricular internal cavity size was normal in size. There is mild left ventricular hypertrophy. Left ventricular diastolic parameters are indeterminate. Right Ventricle: The right ventricular size is normal. No increase in right ventricular wall thickness. Right ventricular systolic function is moderately reduced. Left Atrium: Left atrial size was moderately dilated. Right Atrium: Right atrial size was mildly dilated. Pericardium: There is no evidence of pericardial effusion. Mitral Valve: The mitral valve is normal in structure. There is mild calcification of the mitral valve leaflet(s). Severe mitral annular calcification. Moderate mitral valve regurgitation, with eccentric posteriorly directed jet. No evidence of mitral valve stenosis.  Cardiology Consultation   Patient ID: Pheonix Wisby MRN: [id number]; DOB: [birth year]  Admit date: 11/27/2022 Date of Consult: 11/28/2022  PCP:  [facility]   [facility] Providers Cardiologist:  Sherryl Manges, MD  Electrophysiologist:  Sherryl Manges, MD       Patient Profile:   Joyce Heitman is a 89 y.o. 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  History of Present Illness:   Ms. Olvera has longstanding history of coronary artery disease status post prior LAD and RCA stenting (1995 in 2011 respectively with originally bare-metal stent and then DES), sick sinus syndrome status post [facility] permanent pacemaker 2014 with last generator change in 2023, persistent atrial fibrillation not on anticoagulation secondary to prior history of bleeding and chronic history of anemia, heart failure with midrange EF (45 to 20%), ICM, anemia, diabetes, hypertension, hyperlipidemia, chronic hypoxic respiratory failure on home O2, COPD, GERD. Echocardiogram done during admission in 2021 revealed an LVEF of 45-50%.  Since that hospitalization she has been followed by EP.  She was admitted to [facility] 04/2021 with a sudden complaint of chest pain midline back pain radiating to her front chest and intermittently down her right arm.  Patient states she feels different than her chronic upper back pain.  She presented in 10/11/2021 to the [facility] emergency department, this was after she presented to the ED after a fall at home and EMS was notified via life alert where she was  found down on the kitchen floor, initially incoherent but became more lucid.  She was noted to be bradycardic with heart rate in the 40s.  She was found to be hypotensive in the ED with a MAP of 50 received IV fluids and no improvement.  Initially had to be started on IV pressors.  Urine was concerning for UTI and she was placed on IV antibiotics.  Cardiology was consulted and 2D echocardiogram was reviewed and found nothing was to be added from a cardiology standpoint.  EP was consulted and it was brought to their attention by the [facility] [employer] rep and interrogated the patient's device earlier in the admission and noted that she has not met the EOS and was recommended for generator change prior to discharge.  She underwent generator change on 9/6 by Dr. Graciela Husbands.  He was considered stable for discharge and was subsequently discharged from the facility on 10/18/2021.  She was then reevaluated in the [facility] emergency department on 12/2021 with complaints of chest discomfort that was centralized chest pressure that began this morning.  Patient was wearing 3 L of oxygen at baseline.  She was given 324 mg of aspirin, 1 nitroglycerin, and started on 1 inch of Nitropaste prior to arrival.  Blood pressure was slightly elevated at 149/58.  She was found to be hypokalemic with potassium 3.1, chloride 96, blood glucose 150, BUN of 77 serum creatinine of 1.57, high-sensitivity troponin remained flat at 25 with a hemoglobin was noted to be 8.4.  Device was interrogated with no events over the past 72 hours.  CT of the chest abdomen and pelvis was nonrevealing.  She was subsequently discharged home on antibiotic therapy

## 2022-11-28 NOTE — Plan of Care (Signed)
Problem: Education: Goal: Knowledge of General Education information will improve Description: Including pain rating scale, medication(s)/side effects and non-pharmacologic comfort measures Outcome: Progressing   Problem: Health Behavior/Discharge Planning: Goal: Ability to manage health-related needs will improve Outcome: Progressing   Problem: Coping: Goal: Level of anxiety will decrease Outcome: Progressing   Problem: Elimination: Goal: Will not experience complications related to bowel motility Outcome: Progressing   Problem: Pain Managment: Goal: General experience of comfort will improve Outcome: Progressing   Problem: Safety: Goal: Ability to remain free from injury will improve Outcome: Progressing

## 2022-11-28 NOTE — Plan of Care (Signed)

## 2022-11-28 NOTE — Progress Notes (Signed)
*  PRELIMINARY RESULTS* Echocardiogram 2D Echocardiogram has been performed.  Evelyn Simmons 11/28/2022, 8:12 AM

## 2022-11-28 NOTE — Progress Notes (Signed)
Progress Note   Patient: Evelyn Simmons ZOX:[id number] DOB: 16-May-1929 DOA: 11/27/2022     1 DOS: the patient was seen and examined on 11/28/2022   Brief hospital course: Evelyn Simmons is a 89 year old female with history of dementia, history of COPD, CKD 3B, GERD, insomnia, hyperlipidemia, depression, anxiety, hypertension, who presents to the emergency department for chief concerns of increasing fall and confusion.  Patient is admitted to the hospitalist service for further management evaluation of acute kidney injury, UTI.  Assessment and Plan: *Acute on chronic kidney disease stage IIIb Possibly due to poor oral intake, UTI. Continue gentle IV fluids Continue ceftriaxone 2 g IV daily. Avoid nephrotoxic drugs. Monitor daily renal function.  UTI (urinary tract infection) Follow urine cultures Continue ceftriaxone 2 g IV daily and taper per cultures.  Elevated troponin CHF Possibly demand ischemia in the setting of AKI, UTI. Repeat trop ordered. Continue telemetry monitoring. Echo reviewed shows EF 45%, regional wall motion abnormalities, RV function low. She denies chest pain, or shortness of breath. Cardiology consult placed. Her home dose Torsemide, aldactone on hold due to AKI. Caution with fluid overload.  Afib controlled, due to recurrent transfusion requiring GI bleed history, not felt to be a candidate for anticoagulation.   H/o sick sinus syndrome s/p pacemaker Orthostatic hypotension: Possible cause of falls. Continue gentle IV hydration. Follow cardiology recommendations. Continue telemetry. Monitor vitals closely. She has bilateral knee pain, tenderness, swelling. Will get bilateral knee xrays.  Depression Home sertraline 50 mg daily resumed.  Anemia of chronic disease Hb stable at 8.9. No signs of acute bleeding at this time  Generalized weakness: Continue PT/OT follow up. TOC to work on safe Charity fundraiser.  Severe protein Malnutrition: Dietician on  board. Encourage oral diet, supplements.      Out of bed to chair. Incentive spirometry. Nursing supportive care. Fall, aspiration precautions. DVT prophylaxis   Code Status: Full Code  Subjective: Patient is seen and examined today morning. She is lying in bed, states she did not get out of bed. Feel weak. Has bilateral knee pain.  Physical Exam: Vitals:   11/27/22 1430 11/27/22 1500 11/28/22 0013 11/28/22 0820  BP: 109/75 (!) 159/137 (!) 114/42 (!) 114/49  Pulse:  68 70 70  Resp: (!) 23  20 14   Temp:   (!) 97.4 F (36.3 C) 98 F (36.7 C)  TempSrc:      SpO2:  97% 95% 98%  Weight:      Height:        General - Elderly ill Caucasian female, no apparent distress HEENT - PERRLA, EOMI, atraumatic head, non tender sinuses. Lung - Clear, basal rales. No rhonchi, wheezes Heart - S1, S2 heard, no murmurs, rubs, trace pedal edema. Abdomen - Soft, non tender, bowel sounds good Neuro - Alert, awake and oriented x 3, non focal exam. Skin - Warm and dry.  Data Reviewed:      Latest Ref Rng & Units 11/28/2022    5:57 AM 11/27/2022    9:17 AM 04/23/2022   12:55 PM  CBC  WBC 4.0 - 10.5 K/uL 9.3  12.2  4.8   Hemoglobin 12.0 - 15.0 g/dL 8.9  9.5  7.7   Hematocrit 36.0 - 46.0 % 29.4  31.8  27.4   Platelets 150 - 400 K/uL 334  379  235       Latest Ref Rng & Units 11/28/2022    5:57 AM 11/27/2022    9:17 AM 04/23/2022   12:55 PM  BMP  Progress Note   Patient: Evelyn Simmons ZOX:[id number] DOB: 16-May-1929 DOA: 11/27/2022     1 DOS: the patient was seen and examined on 11/28/2022   Brief hospital course: Evelyn Simmons is a 89 year old female with history of dementia, history of COPD, CKD 3B, GERD, insomnia, hyperlipidemia, depression, anxiety, hypertension, who presents to the emergency department for chief concerns of increasing fall and confusion.  Patient is admitted to the hospitalist service for further management evaluation of acute kidney injury, UTI.  Assessment and Plan: *Acute on chronic kidney disease stage IIIb Possibly due to poor oral intake, UTI. Continue gentle IV fluids Continue ceftriaxone 2 g IV daily. Avoid nephrotoxic drugs. Monitor daily renal function.  UTI (urinary tract infection) Follow urine cultures Continue ceftriaxone 2 g IV daily and taper per cultures.  Elevated troponin CHF Possibly demand ischemia in the setting of AKI, UTI. Repeat trop ordered. Continue telemetry monitoring. Echo reviewed shows EF 45%, regional wall motion abnormalities, RV function low. She denies chest pain, or shortness of breath. Cardiology consult placed. Her home dose Torsemide, aldactone on hold due to AKI. Caution with fluid overload.  Afib controlled, due to recurrent transfusion requiring GI bleed history, not felt to be a candidate for anticoagulation.   H/o sick sinus syndrome s/p pacemaker Orthostatic hypotension: Possible cause of falls. Continue gentle IV hydration. Follow cardiology recommendations. Continue telemetry. Monitor vitals closely. She has bilateral knee pain, tenderness, swelling. Will get bilateral knee xrays.  Depression Home sertraline 50 mg daily resumed.  Anemia of chronic disease Hb stable at 8.9. No signs of acute bleeding at this time  Generalized weakness: Continue PT/OT follow up. TOC to work on safe Charity fundraiser.  Severe protein Malnutrition: Dietician on  board. Encourage oral diet, supplements.      Out of bed to chair. Incentive spirometry. Nursing supportive care. Fall, aspiration precautions. DVT prophylaxis   Code Status: Full Code  Subjective: Patient is seen and examined today morning. She is lying in bed, states she did not get out of bed. Feel weak. Has bilateral knee pain.  Physical Exam: Vitals:   11/27/22 1430 11/27/22 1500 11/28/22 0013 11/28/22 0820  BP: 109/75 (!) 159/137 (!) 114/42 (!) 114/49  Pulse:  68 70 70  Resp: (!) 23  20 14   Temp:   (!) 97.4 F (36.3 C) 98 F (36.7 C)  TempSrc:      SpO2:  97% 95% 98%  Weight:      Height:        General - Elderly ill Caucasian female, no apparent distress HEENT - PERRLA, EOMI, atraumatic head, non tender sinuses. Lung - Clear, basal rales. No rhonchi, wheezes Heart - S1, S2 heard, no murmurs, rubs, trace pedal edema. Abdomen - Soft, non tender, bowel sounds good Neuro - Alert, awake and oriented x 3, non focal exam. Skin - Warm and dry.  Data Reviewed:      Latest Ref Rng & Units 11/28/2022    5:57 AM 11/27/2022    9:17 AM 04/23/2022   12:55 PM  CBC  WBC 4.0 - 10.5 K/uL 9.3  12.2  4.8   Hemoglobin 12.0 - 15.0 g/dL 8.9  9.5  7.7   Hematocrit 36.0 - 46.0 % 29.4  31.8  27.4   Platelets 150 - 400 K/uL 334  379  235       Latest Ref Rng & Units 11/28/2022    5:57 AM 11/27/2022    9:17 AM 04/23/2022   12:55 PM  BMP  Progress Note   Patient: Evelyn Simmons ZOX:[id number] DOB: 16-May-1929 DOA: 11/27/2022     1 DOS: the patient was seen and examined on 11/28/2022   Brief hospital course: Evelyn Simmons is a 89 year old female with history of dementia, history of COPD, CKD 3B, GERD, insomnia, hyperlipidemia, depression, anxiety, hypertension, who presents to the emergency department for chief concerns of increasing fall and confusion.  Patient is admitted to the hospitalist service for further management evaluation of acute kidney injury, UTI.  Assessment and Plan: *Acute on chronic kidney disease stage IIIb Possibly due to poor oral intake, UTI. Continue gentle IV fluids Continue ceftriaxone 2 g IV daily. Avoid nephrotoxic drugs. Monitor daily renal function.  UTI (urinary tract infection) Follow urine cultures Continue ceftriaxone 2 g IV daily and taper per cultures.  Elevated troponin CHF Possibly demand ischemia in the setting of AKI, UTI. Repeat trop ordered. Continue telemetry monitoring. Echo reviewed shows EF 45%, regional wall motion abnormalities, RV function low. She denies chest pain, or shortness of breath. Cardiology consult placed. Her home dose Torsemide, aldactone on hold due to AKI. Caution with fluid overload.  Afib controlled, due to recurrent transfusion requiring GI bleed history, not felt to be a candidate for anticoagulation.   H/o sick sinus syndrome s/p pacemaker Orthostatic hypotension: Possible cause of falls. Continue gentle IV hydration. Follow cardiology recommendations. Continue telemetry. Monitor vitals closely. She has bilateral knee pain, tenderness, swelling. Will get bilateral knee xrays.  Depression Home sertraline 50 mg daily resumed.  Anemia of chronic disease Hb stable at 8.9. No signs of acute bleeding at this time  Generalized weakness: Continue PT/OT follow up. TOC to work on safe Charity fundraiser.  Severe protein Malnutrition: Dietician on  board. Encourage oral diet, supplements.      Out of bed to chair. Incentive spirometry. Nursing supportive care. Fall, aspiration precautions. DVT prophylaxis   Code Status: Full Code  Subjective: Patient is seen and examined today morning. She is lying in bed, states she did not get out of bed. Feel weak. Has bilateral knee pain.  Physical Exam: Vitals:   11/27/22 1430 11/27/22 1500 11/28/22 0013 11/28/22 0820  BP: 109/75 (!) 159/137 (!) 114/42 (!) 114/49  Pulse:  68 70 70  Resp: (!) 23  20 14   Temp:   (!) 97.4 F (36.3 C) 98 F (36.7 C)  TempSrc:      SpO2:  97% 95% 98%  Weight:      Height:        General - Elderly ill Caucasian female, no apparent distress HEENT - PERRLA, EOMI, atraumatic head, non tender sinuses. Lung - Clear, basal rales. No rhonchi, wheezes Heart - S1, S2 heard, no murmurs, rubs, trace pedal edema. Abdomen - Soft, non tender, bowel sounds good Neuro - Alert, awake and oriented x 3, non focal exam. Skin - Warm and dry.  Data Reviewed:      Latest Ref Rng & Units 11/28/2022    5:57 AM 11/27/2022    9:17 AM 04/23/2022   12:55 PM  CBC  WBC 4.0 - 10.5 K/uL 9.3  12.2  4.8   Hemoglobin 12.0 - 15.0 g/dL 8.9  9.5  7.7   Hematocrit 36.0 - 46.0 % 29.4  31.8  27.4   Platelets 150 - 400 K/uL 334  379  235       Latest Ref Rng & Units 11/28/2022    5:57 AM 11/27/2022    9:17 AM 04/23/2022   12:55 PM  BMP  Glucose 70 - 99 mg/dL 98  161  096   BUN 8 - 23 mg/dL 045  409  52   Creatinine 0.44 - 1.00 mg/dL 8.11  9.14  7.82   Sodium 135 - 145 mmol/L 138  137  141   Potassium 3.5 - 5.1 mmol/L 3.7  4.1  3.9   Chloride 98 - 111 mmol/L 98  95  99   CO2 22 - 32 mmol/L 27  27  32   Calcium 8.9 - 10.3 mg/dL 95.6  21.3  08.6    ECHOCARDIOGRAM COMPLETE  Result Date: 11/28/2022    ECHOCARDIOGRAM REPORT   Patient Name:   LUNNA VOGELGESANG Date of Exam: 11/28/2022 Medical Rec #:  [id number]     Height:       61.0 in Accession #:    [id number]    Weight:        136.1 lb Date of Birth:  15-Jun-1929      BSA:          1.604 m Patient Age:    89 years      BP:           114/42 mmHg Patient Gender: F             HR:           70 bpm. Exam Location:  [facility] Procedure: 2D Echo, Cardiac Doppler and Color Doppler Indications:     Elevated Troponin  History:         Patient has prior history of Echocardiogram examinations, most                  recent 10/12/2021. CHF, CAD; Risk Factors:Hypertension and                  Diabetes.  Sonographer:     Cristela Blue Referring Phys:  [id number] AMY N COX Diagnosing Phys: Julien Nordmann MD  Sonographer Comments: Suboptimal apical window. IMPRESSIONS  1. Left ventricular ejection fraction, by estimation, is 45 to 50%. The left ventricle has mildly decreased function. The left ventricle demonstrates regional wall motion abnormalities (anterior/anteroseptal wall hypokinesis). There is mild left ventricular hypertrophy. Left ventricular diastolic parameters are indeterminate.  2. Right ventricular systolic function is moderately reduced. The right ventricular size is normal.  3. Left atrial size was moderately dilated.  4. Right atrial size was mildly dilated.  5. The mitral valve is normal in structure. Moderate mitral valve regurgitation. No evidence of mitral stenosis. Severe mitral annular calcification.  6. The aortic valve is normal in structure. There is moderate calcification of the aortic valve. Aortic valve regurgitation is mild. Mild aortic valve stenosis. Aortic valve mean gradient measures 10.3 mmHg.  7. The inferior vena cava is normal in size with greater than 50% respiratory variability, suggesting right atrial pressure of 3 mmHg. FINDINGS  Left Ventricle: Left ventricular ejection fraction, by estimation, is 45 to 50%. The left ventricle has mildly decreased function. The left ventricle demonstrates regional wall motion abnormalities. The left ventricular internal cavity size was normal in size. There is mild left ventricular hypertrophy.  Left ventricular diastolic parameters are indeterminate. Right Ventricle: The right ventricular size is normal. No increase in right ventricular wall thickness. Right ventricular systolic function is moderately reduced. Left Atrium: Left atrial size was moderately dilated. Right Atrium: Right atrial size was mildly dilated. Pericardium: There is no evidence of pericardial effusion. Mitral Valve: The mitral valve is normal in structure. There is mild calcification  Progress Note   Patient: Evelyn Simmons ZOX:[id number] DOB: 16-May-1929 DOA: 11/27/2022     1 DOS: the patient was seen and examined on 11/28/2022   Brief hospital course: Evelyn Simmons is a 89 year old female with history of dementia, history of COPD, CKD 3B, GERD, insomnia, hyperlipidemia, depression, anxiety, hypertension, who presents to the emergency department for chief concerns of increasing fall and confusion.  Patient is admitted to the hospitalist service for further management evaluation of acute kidney injury, UTI.  Assessment and Plan: *Acute on chronic kidney disease stage IIIb Possibly due to poor oral intake, UTI. Continue gentle IV fluids Continue ceftriaxone 2 g IV daily. Avoid nephrotoxic drugs. Monitor daily renal function.  UTI (urinary tract infection) Follow urine cultures Continue ceftriaxone 2 g IV daily and taper per cultures.  Elevated troponin CHF Possibly demand ischemia in the setting of AKI, UTI. Repeat trop ordered. Continue telemetry monitoring. Echo reviewed shows EF 45%, regional wall motion abnormalities, RV function low. She denies chest pain, or shortness of breath. Cardiology consult placed. Her home dose Torsemide, aldactone on hold due to AKI. Caution with fluid overload.  Afib controlled, due to recurrent transfusion requiring GI bleed history, not felt to be a candidate for anticoagulation.   H/o sick sinus syndrome s/p pacemaker Orthostatic hypotension: Possible cause of falls. Continue gentle IV hydration. Follow cardiology recommendations. Continue telemetry. Monitor vitals closely. She has bilateral knee pain, tenderness, swelling. Will get bilateral knee xrays.  Depression Home sertraline 50 mg daily resumed.  Anemia of chronic disease Hb stable at 8.9. No signs of acute bleeding at this time  Generalized weakness: Continue PT/OT follow up. TOC to work on safe Charity fundraiser.  Severe protein Malnutrition: Dietician on  board. Encourage oral diet, supplements.      Out of bed to chair. Incentive spirometry. Nursing supportive care. Fall, aspiration precautions. DVT prophylaxis   Code Status: Full Code  Subjective: Patient is seen and examined today morning. She is lying in bed, states she did not get out of bed. Feel weak. Has bilateral knee pain.  Physical Exam: Vitals:   11/27/22 1430 11/27/22 1500 11/28/22 0013 11/28/22 0820  BP: 109/75 (!) 159/137 (!) 114/42 (!) 114/49  Pulse:  68 70 70  Resp: (!) 23  20 14   Temp:   (!) 97.4 F (36.3 C) 98 F (36.7 C)  TempSrc:      SpO2:  97% 95% 98%  Weight:      Height:        General - Elderly ill Caucasian female, no apparent distress HEENT - PERRLA, EOMI, atraumatic head, non tender sinuses. Lung - Clear, basal rales. No rhonchi, wheezes Heart - S1, S2 heard, no murmurs, rubs, trace pedal edema. Abdomen - Soft, non tender, bowel sounds good Neuro - Alert, awake and oriented x 3, non focal exam. Skin - Warm and dry.  Data Reviewed:      Latest Ref Rng & Units 11/28/2022    5:57 AM 11/27/2022    9:17 AM 04/23/2022   12:55 PM  CBC  WBC 4.0 - 10.5 K/uL 9.3  12.2  4.8   Hemoglobin 12.0 - 15.0 g/dL 8.9  9.5  7.7   Hematocrit 36.0 - 46.0 % 29.4  31.8  27.4   Platelets 150 - 400 K/uL 334  379  235       Latest Ref Rng & Units 11/28/2022    5:57 AM 11/27/2022    9:17 AM 04/23/2022   12:55 PM  BMP  Glucose 70 - 99 mg/dL 98  161  096   BUN 8 - 23 mg/dL 045  409  52   Creatinine 0.44 - 1.00 mg/dL 8.11  9.14  7.82   Sodium 135 - 145 mmol/L 138  137  141   Potassium 3.5 - 5.1 mmol/L 3.7  4.1  3.9   Chloride 98 - 111 mmol/L 98  95  99   CO2 22 - 32 mmol/L 27  27  32   Calcium 8.9 - 10.3 mg/dL 95.6  21.3  08.6    ECHOCARDIOGRAM COMPLETE  Result Date: 11/28/2022    ECHOCARDIOGRAM REPORT   Patient Name:   LUNNA VOGELGESANG Date of Exam: 11/28/2022 Medical Rec #:  [id number]     Height:       61.0 in Accession #:    [id number]    Weight:        136.1 lb Date of Birth:  15-Jun-1929      BSA:          1.604 m Patient Age:    89 years      BP:           114/42 mmHg Patient Gender: F             HR:           70 bpm. Exam Location:  [facility] Procedure: 2D Echo, Cardiac Doppler and Color Doppler Indications:     Elevated Troponin  History:         Patient has prior history of Echocardiogram examinations, most                  recent 10/12/2021. CHF, CAD; Risk Factors:Hypertension and                  Diabetes.  Sonographer:     Cristela Blue Referring Phys:  [id number] AMY N [facility] Diagnosing Phys: Julien Nordmann MD  Sonographer Comments: Suboptimal apical window. IMPRESSIONS  1. Left ventricular ejection fraction, by estimation, is 45 to 50%. The left ventricle has mildly decreased function. The left ventricle demonstrates regional wall motion abnormalities (anterior/anteroseptal wall hypokinesis). There is mild left ventricular hypertrophy. Left ventricular diastolic parameters are indeterminate.  2. Right ventricular systolic function is moderately reduced. The right ventricular size is normal.  3. Left atrial size was moderately dilated.  4. Right atrial size was mildly dilated.  5. The mitral valve is normal in structure. Moderate mitral valve regurgitation. No evidence of mitral stenosis. Severe mitral annular calcification.  6. The aortic valve is normal in structure. There is moderate calcification of the aortic valve. Aortic valve regurgitation is mild. Mild aortic valve stenosis. Aortic valve mean gradient measures 10.3 mmHg.  7. The inferior vena cava is normal in size with greater than 50% respiratory variability, suggesting right atrial pressure of 3 mmHg. FINDINGS  Left Ventricle: Left ventricular ejection fraction, by estimation, is 45 to 50%. The left ventricle has mildly decreased function. The left ventricle demonstrates regional wall motion abnormalities. The left ventricular internal cavity size was normal in size. There is mild left ventricular hypertrophy.  Left ventricular diastolic parameters are indeterminate. Right Ventricle: The right ventricular size is normal. No increase in right ventricular wall thickness. Right ventricular systolic function is moderately reduced. Left Atrium: Left atrial size was moderately dilated. Right Atrium: Right atrial size was mildly dilated. Pericardium: There is no evidence of pericardial effusion. Mitral Valve: The mitral valve is normal in structure. There is mild calcification

## 2022-11-28 NOTE — Plan of Care (Signed)
  Problem: Clinical Measurements: Goal: Diagnostic test results will improve Outcome: Progressing Goal: Respiratory complications will improve Outcome: Progressing Goal: Cardiovascular complication will be avoided Outcome: Progressing   Problem: Coping: Goal: Level of anxiety will decrease Outcome: Progressing   Problem: Pain Managment: Goal: General experience of comfort will improve Outcome: Progressing   Problem: Safety: Goal: Ability to remain free from injury will improve Outcome: Progressing   

## 2022-11-28 NOTE — Progress Notes (Signed)
Initial Nutrition Assessment  DOCUMENTATION CODES:   Severe malnutrition in context of chronic illness  INTERVENTION:   -Ensure Enlive po TID, each supplement provides 350 kcal and 20 grams of protein.  -MVI with minerals daily -Ensure Enlive po TID, each supplement provides 350 kcal and 20 grams of protein -Feeding assistance with meals   NUTRITION DIAGNOSIS:   Severe Malnutrition related to chronic illness (dementia) as evidenced by moderate fat depletion, severe fat depletion, moderate muscle depletion, severe muscle depletion.  GOAL:   Patient will meet greater than or equal to 90% of their needs  MONITOR:   PO intake, Supplement acceptance  REASON FOR ASSESSMENT:   Malnutrition Screening Tool    ASSESSMENT:   Pt with history of dementia, history of COPD, CKD 3B, GERD, insomnia, hyperlipidemia, depression, anxiety, hypertension, who presents for chief concerns of increasing fall and altered mental status.  Pt admitted with AKI and UTI.   Reviewed I/O's: +1.3 L x 24 hours  UOP: 700 ml x 24 hours  Spoke with pt at bedside, who was pleasant and in good spirits today. Pt able to answer questions appropriately, but often needed to be redirected about place and situation. Unsure of accuracy of information provided.    Pt shares that she has had a poor appetite over the past month, reports "food doesn't taste right". Pt shares she usually eats 1-2 meals per day, which consist of vegetables (cabbage and collards). Pt shares she does not like pork, but is looking forward to breakfast. Pt has well fitting dentures and denies any difficulty chewing or swallowing.   Pt endorses wt loss, but unsure of wt loss or UBW. Reviewed wt hx; pt has experienced a 6.4% wt loss over the past 7 months. While this is not significant for time frame, it is concerning given advanced age and history of poor oral intake.   Discussed importance of good meal and supplement intake to promote healing.  Pt amenable to supplements.   Medications reviewed and include vitamin C and ferrous sulfate.   Lab Results  Component Value Date   HGBA1C 6.8 (H) 10/12/2021   PTA DM medications are none.   Labs reviewed: CBGS: 151 (inpatient orders for glycemic control are none).    NUTRITION - FOCUSED PHYSICAL EXAM:  Flowsheet Row Most Recent Value  Orbital Region Severe depletion  Upper Arm Region Severe depletion  Thoracic and Lumbar Region Moderate depletion  Buccal Region Moderate depletion  Temple Region Severe depletion  Clavicle Bone Region Severe depletion  Clavicle and Acromion Bone Region Severe depletion  Scapular Bone Region Severe depletion  Dorsal Hand Moderate depletion  Patellar Region Moderate depletion  Anterior Thigh Region Moderate depletion  Posterior Calf Region Moderate depletion  Edema (RD Assessment) None  Hair Reviewed  Eyes Reviewed  Mouth Reviewed  Skin Reviewed  Nails Reviewed       Diet Order:   Diet Order             Diet Heart Room service appropriate? Yes; Fluid consistency: Thin  Diet effective now                   EDUCATION NEEDS:   Education needs have been addressed  Skin:  Skin Assessment: Reviewed RN Assessment  Last BM:  Unknown  Height:   Ht Readings from Last 1 Encounters:  11/27/22 5\' 1"  (1.549 m)    Weight:   Wt Readings from Last 1 Encounters:  11/27/22 61.7 kg    Ideal  Body Weight:  47.7 kg  BMI:  Body mass index is 25.72 kg/m.  Estimated Nutritional Needs:   Kcal:  1550-1750  Protein:  80-95 grams  Fluid:  > 1.5 L    Levada Schilling, RD, LDN, CDCES Registered Dietitian III Certified Diabetes Care and Education Specialist Please refer to [facility] for RD and/or RD on-call/weekend/after hours pager

## 2022-11-29 DIAGNOSIS — N1832 Chronic kidney disease, stage 3b: Secondary | ICD-10-CM

## 2022-11-29 DIAGNOSIS — N170 Acute kidney failure with tubular necrosis: Secondary | ICD-10-CM | POA: Diagnosis not present

## 2022-11-29 LAB — URINE CULTURE: Culture: >=100000 — AB

## 2022-11-29 MED ORDER — SODIUM CHLORIDE 0.9 % IV SOLN: INTRAVENOUS | Status: AC

## 2022-11-29 NOTE — [place] FL2 (Signed)
[place] MEDICAID FL2 LEVEL OF CARE FORM     IDENTIFICATION  Patient Name: Evelyn Simmons Birthdate: Jul 27, 1929 Sex: female Admission Date (Current Location): 11/27/2022  [facility] and [place] Number:  Chiropodist and Address:  [facility], [address], [place], [place] [address]      Provider Number: [id number]  Attending Physician Name and Address:  Darlin Priestly, MD  Relative Name and Phone Number:  polly Daughter [phone]    Current Level of Care: Hospital Recommended Level of Care: Skilled Nursing Facility Prior Approval Number:    Date Approved/Denied:   PASRR Number: [id number]  Discharge Plan: SNF    Current Diagnoses: Patient Active Problem List   Diagnosis Date Noted   Protein-calorie malnutrition, severe 11/28/2022   Chronic dementia (HCC) 11/28/2022   Fall at home 11/28/2022   Acute renal failure with acute tubular necrosis superimposed on stage 3 chronic kidney disease (HCC) 11/27/2022   UTI (urinary tract infection) 11/27/2022   Falling 11/27/2022   Anemia of chronic disease 11/27/2022   Depression 11/27/2022   Elevated troponin 11/27/2022   NSVT (nonsustained ventricular tachycardia) (HCC) 01/22/2022   Syncope and collapse    Hypotension 10/12/2021   Septic shock (HCC)    Cystitis    Complete heart block (HCC) 12/13/2019   Black stools    Acute on chronic heart failure with preserved ejection fraction (HFpEF) (HCC)    Acute on chronic systolic CHF (congestive heart failure) (HCC) 10/18/2019   Type 2 diabetes mellitus with obesity (HCC) 10/18/2019   Anemia due to chronic kidney disease 10/18/2019   COPD (chronic obstructive pulmonary disease) (HCC) 10/18/2019   Toe pain, right 10/18/2019   Persistent/Permanent atrial fibrillation (HCC)    Acute CHF (congestive heart failure) (HCC) 10/04/2019   Chronic respiratory failure with hypoxia (HCC)    CHF (congestive heart failure) (HCC) 09/18/2019   Atrial  fibrillation with rapid ventricular response (HCC) 09/17/2019   Acute diastolic CHF (congestive heart failure) (HCC) 09/17/2019   Mobility impaired 05/29/2014   CAD (coronary artery disease) 05/09/2014   History of coronary artery stent placement 05/09/2014   Hyperlipidemia 05/09/2014   Bilateral low back pain with left-sided sciatica 04/21/2014   Knee effusion 04/21/2014   CKD stage 3 due to type 2 diabetes mellitus (HCC) 04/21/2014   Pacemaker 04/21/2014   Encounter to establish care 04/20/2014    Orientation RESPIRATION BLADDER Height & Weight     Self, Place  Normal External catheter, Incontinent Weight: 61.7 kg Height:  5\' 1"  (154.9 cm)  BEHAVIORAL SYMPTOMS/MOOD NEUROLOGICAL BOWEL NUTRITION STATUS      Incontinent Diet (see dc summary)  AMBULATORY STATUS COMMUNICATION OF NEEDS Skin   Extensive Assist Verbally Normal                       Personal Care Assistance Level of Assistance  Bathing, Feeding, Dressing Bathing Assistance: Limited assistance Feeding assistance: Limited assistance Dressing Assistance: Limited assistance     Functional Limitations Info  Sight, Hearing, Speech Sight Info: Impaired (glasses) Hearing Info: Adequate Speech Info: Adequate    SPECIAL CARE FACTORS FREQUENCY  PT (By licensed PT), OT (By licensed OT)     PT Frequency: 5 times per week OT Frequency: 5 times per week            Contractures      Additional Factors Info  Code Status, Allergies Code Status Info: full code Allergies Info: Penicillins, Other, Sulfa Antibiotics, Penicillin G  Current Medications (11/29/2022):  This is the current hospital active medication list Current Facility-Administered Medications  Medication Dose Route Frequency Provider Last Rate Last Admin   0.9 %  sodium chloride infusion   Intravenous Continuous Darlin Priestly, MD 75 mL/hr at 11/29/22 1203 New Bag at 11/29/22 1203   acetaminophen (TYLENOL) tablet 650 mg  650 mg Oral Q6H PRN  Cox, Amy N, DO       Or   acetaminophen (TYLENOL) suppository 650 mg  650 mg Rectal Q6H PRN Cox, Amy N, DO       ascorbic acid (VITAMIN C) tablet 250 mg  250 mg Oral Daily Jaynie Bream, RPH   250 mg at 11/29/22 1002   cefTRIAXone (ROCEPHIN) 2 g in sodium chloride 0.9 % 100 mL IVPB  2 g Intravenous Q24H Cox, Amy N, DO 200 mL/hr at 11/29/22 1010 2 g at 11/29/22 1010   famotidine (PEPCID) tablet 20 mg  20 mg Oral Daily Cox, Amy N, DO   20 mg at 11/29/22 1002   feeding supplement (ENSURE ENLIVE / ENSURE PLUS) liquid 237 mL  237 mL Oral TID BM Marcelino Duster, MD   237 mL at 11/29/22 1302   ferrous sulfate tablet 325 mg  325 mg Oral Q breakfast Cox, Amy N, DO   325 mg at 11/29/22 0748   guaiFENesin-dextromethorphan (ROBITUSSIN DM) 100-10 MG/5ML syrup 5 mL  5 mL Oral QHS PRN Jaynie Bream, RPH       heparin injection 5,000 Units  5,000 Units Subcutaneous Q8H Cox, Amy N, DO   5,000 Units at 11/29/22 1301   multivitamin with minerals tablet 1 tablet  1 tablet Oral Daily Marcelino Duster, MD   1 tablet at 11/29/22 1002   nitroGLYCERIN (NITROSTAT) SL tablet 0.4 mg  0.4 mg Sublingual Q5 Min x 3 PRN Cox, Amy N, DO       ondansetron (ZOFRAN) tablet 4 mg  4 mg Oral Q6H PRN Cox, Amy N, DO       Or   ondansetron (ZOFRAN) injection 4 mg  4 mg Intravenous Q6H PRN Cox, Amy N, DO       QUEtiapine (SEROQUEL) tablet 100 mg  100 mg Oral QHS Cox, Amy N, DO   100 mg at 11/28/22 2127   senna-docusate (Senokot-S) tablet 1 tablet  1 tablet Oral QHS PRN Cox, Amy N, DO       sertraline (ZOLOFT) tablet 50 mg  50 mg Oral Daily Cox, Amy N, DO   50 mg at 11/29/22 1002   traMADol (ULTRAM) tablet 50 mg  50 mg Oral Daily PRN Cox, Amy N, DO   50 mg at 11/29/22 4098     Discharge Medications: Please see discharge summary for a list of discharge medications.  Relevant Imaging Results:  Relevant Lab Results:   Additional Information [id number]  Marlowe Sax, RN

## 2022-11-29 NOTE — Progress Notes (Signed)
Progress Note   Patient: Evelyn Simmons ULA:[id number] DOB: 1929-10-07 DOA: 11/27/2022     2 DOS: the patient was seen and examined on 11/29/2022   Brief hospital course: Evelyn Simmons is a 89 year old female with history of dementia, history of COPD, CKD 3B, GERD, insomnia, hyperlipidemia, depression, anxiety, hypertension, who presents to the emergency department for chief concerns of increasing fall and confusion.  Patient is admitted to the hospitalist service for further management evaluation of acute kidney injury, UTI.  Assessment and Plan: *Acute on chronic kidney disease stage IIIb Possibly due to poor oral intake.  BUN elevated.  Cr improved some with IVF. --cont MIVF@75  for 12 more hours  UTI (urinary tract infection) --cont ceftriaxone pending urine cx  Elevated troponin Possibly demand ischemia in the setting of AKI, UTI. Trop 70's flat. --cardio consulted, No additional cardiac studies needed   Chronic sCHF Ejection fraction 45 to 50%, unchanged Medication list indicates metolazone 5 mg 3 days a week --per cardio, stop all diuretics at this time and at discharge Not a candidate for ACE, ARB, ARNI given low blood pressure and renal dysfunction  Afib controlled, due to recurrent transfusion requiring GI bleed history, not felt to be a candidate for anticoagulation.   H/o sick sinus syndrome s/p pacemaker Orthostatic hypotension: Possible cause of falls. --cont MIVF@75  for 12 more hours  Depression --cont Home sertraline 50 mg daily resumed.  Anemia of chronic disease Hb stable at 8.9. No signs of acute bleeding at this time  Generalized weakness: Continue PT/OT follow up. --rec SNF rehab  Severe protein Malnutrition: Dietician on board. Encourage oral diet, supplements.  COPD Chronic hypoxemic respiratory failure on 2L O2 --cont 2L O2       Out of bed to chair. Incentive spirometry. Nursing supportive care. Fall, aspiration precautions. DVT  prophylaxis   Code Status: Full Code  Subjective:  Pt denied dysuria.   Physical Exam: Vitals:   11/28/22 1417 11/29/22 0043 11/29/22 0758 11/29/22 1525  BP: (!) 109/46 (!) 114/52 (!) 115/47 (!) 114/52  Pulse: 68 72 69 69  Resp: 14 18 14 14   Temp: (!) 97.5 F (36.4 C) 98.6 F (37 C) 98 F (36.7 C) 98.3 F (36.8 C)  TempSrc:      SpO2: 98% 100% 100% 96%  Weight:      Height:        Constitutional: NAD, sleepy but arousable HEENT: conjunctivae and lids normal, EOMI CV: No cyanosis.   RESP: normal respiratory effort Neuro: II - XII grossly intact.     Data Reviewed:      Latest Ref Rng & Units 11/28/2022    5:57 AM 11/27/2022    9:17 AM 04/23/2022   12:55 PM  CBC  WBC 4.0 - 10.5 K/uL 9.3  12.2  4.8   Hemoglobin 12.0 - 15.0 g/dL 8.9  9.5  7.7   Hematocrit 36.0 - 46.0 % 29.4  31.8  27.4   Platelets 150 - 400 K/uL 334  379  235       Latest Ref Rng & Units 11/28/2022    5:57 AM 11/27/2022    9:17 AM 04/23/2022   12:55 PM  BMP  Glucose 70 - 99 mg/dL 98  212  248   BUN 8 - 23 mg/dL 250  037  52   Creatinine 0.44 - 1.00 mg/dL 0.48  8.89  1.69   Sodium 135 - 145 mmol/L 138  137  141   Potassium 3.5 - 5.1 mmol/L 3.7  4.1  3.9   Chloride 98 - 111 mmol/L 98  95  99   CO2 22 - 32 mmol/L 27  27  32   Calcium 8.9 - 10.3 mg/dL 03.4  74.2  59.5    DG Knee 1-2 Views Left  Result Date: 11/28/2022 CLINICAL DATA:  Knee swelling after fall EXAM: LEFT KNEE - 1-2 VIEW COMPARISON:  None Available. FINDINGS: Status post left total knee arthroplasty. Arthroplasty components are in their expected alignment without periprosthetic lucency or fracture. No knee joint effusion. Soft tissue swelling is present medially. Advanced atherosclerotic vascular calcification. IMPRESSION: Status post left total knee arthroplasty without evidence of hardware complication. No acute findings. Electronically Signed   By: Duanne Guess D.O.   On: 11/28/2022 19:58   DG Knee 1-2 Views Right  Result  Date: 11/28/2022 CLINICAL DATA:  Knee swelling, fall EXAM: RIGHT KNEE - 1-2 VIEW COMPARISON:  None Available. FINDINGS: Status post right total knee arthroplasty. Arthroplasty components are in their expected alignment without periprosthetic lucency or fracture. No joint effusion. No focal soft tissue swelling. Advanced atherosclerotic vascular calcification. IMPRESSION: Status post right total knee arthroplasty without evidence of hardware complication. Electronically Signed   By: Duanne Guess D.O.   On: 11/28/2022 19:56   ECHOCARDIOGRAM COMPLETE  Result Date: 11/28/2022    ECHOCARDIOGRAM REPORT   Patient Name:   Evelyn Simmons Date of Exam: 11/28/2022 Medical Rec #:  [id number]     Height:       61.0 in Accession #:    [id number]    Weight:       136.1 lb Date of Birth:  [birth year]      BSA:          1.604 m Patient Age:    89 years      BP:           114/42 mmHg Patient Gender: F             HR:           70 bpm. Exam Location:  [facility] Procedure: 2D Echo, Cardiac Doppler and Color Doppler Indications:     Elevated Troponin  History:         Patient has prior history of Echocardiogram examinations, most                  recent 10/12/2021. CHF, CAD; Risk Factors:Hypertension and                  Diabetes.  Sonographer:     Cristela Blue Referring Phys:  [id number] AMY N COX Diagnosing Phys: Julien Nordmann MD  Sonographer Comments: Suboptimal apical window. IMPRESSIONS  1. Left ventricular ejection fraction, by estimation, is 45 to 50%. The left ventricle has mildly decreased function. The left ventricle demonstrates regional wall motion abnormalities (anterior/anteroseptal wall hypokinesis). There is mild left ventricular hypertrophy. Left ventricular diastolic parameters are indeterminate.  2. Right ventricular systolic function is moderately reduced. The right ventricular size is normal.  3. Left atrial size was moderately dilated.  4. Right atrial size was mildly dilated.  5. The mitral valve is normal in  structure. Moderate mitral valve regurgitation. No evidence of mitral stenosis. Severe mitral annular calcification.  6. The aortic valve is normal in structure. There is moderate calcification of the aortic valve. Aortic valve regurgitation is mild. Mild aortic valve stenosis. Aortic valve mean gradient measures 10.3 mmHg.  7. The inferior vena cava is normal in size with greater than 50%  respiratory variability, suggesting right atrial pressure of 3 mmHg. FINDINGS  Left Ventricle: Left ventricular ejection fraction, by estimation, is 45 to 50%. The left ventricle has mildly decreased function. The left ventricle demonstrates regional wall motion abnormalities. The left ventricular internal cavity size was normal in size. There is mild left ventricular hypertrophy. Left ventricular diastolic parameters are indeterminate. Right Ventricle: The right ventricular size is normal. No increase in right ventricular wall thickness. Right ventricular systolic function is moderately reduced. Left Atrium: Left atrial size was moderately dilated. Right Atrium: Right atrial size was mildly dilated. Pericardium: There is no evidence of pericardial effusion. Mitral Valve: The mitral valve is normal in structure. There is mild calcification of the mitral valve leaflet(s). Severe mitral annular calcification. Moderate mitral valve regurgitation, with eccentric posteriorly directed jet. No evidence of mitral valve stenosis. Tricuspid Valve: The tricuspid valve is normal in structure. Tricuspid valve regurgitation is mild . No evidence of tricuspid stenosis. Aortic Valve: The aortic valve is normal in structure. There is moderate calcification of the aortic valve. Aortic valve regurgitation is mild. Mild aortic stenosis is present. Aortic valve mean gradient measures 10.3 mmHg. Aortic valve peak gradient measures 17.6 mmHg. Aortic valve area, by VTI measures 1.07 cm. Pulmonic Valve: The pulmonic valve was normal in structure.  Pulmonic valve regurgitation is not visualized. No evidence of pulmonic stenosis. Aorta: The aortic root is normal in size and structure. Venous: The inferior vena cava is normal in size with greater than 50% respiratory variability, suggesting right atrial pressure of 3 mmHg. IAS/Shunts: No atrial level shunt detected by color flow Doppler. Additional Comments: A device lead is visualized.  LEFT VENTRICLE PLAX 2D LVIDd:         4.70 cm LVIDs:         3.30 cm LV PW:         1.00 cm LV IVS:        1.30 cm LVOT diam:     2.00 cm LV SV:         39 LV SV Index:   24 LVOT Area:     3.14 cm  RIGHT VENTRICLE RV Basal diam:  3.60 cm RV Mid diam:    4.00 cm RV S prime:     11.50 cm/s TAPSE (M-mode): 1.6 cm LEFT ATRIUM            Index         RIGHT ATRIUM           Index LA diam:      4.90 cm  3.06 cm/m    RA Area:     22.30 cm LA Vol (A2C): 82.1 ml  51.19 ml/m   RA Volume:   52.10 ml  32.49 ml/m LA Vol (A4C): 177.0 ml 110.37 ml/m  AORTIC VALVE AV Area (Vmax):    0.91 cm AV Area (Vmean):   0.88 cm AV Area (VTI):     1.07 cm AV Vmax:           209.67 cm/s AV Vmean:          146.667 cm/s AV VTI:            0.365 m AV Peak Grad:      17.6 mmHg AV Mean Grad:      10.3 mmHg LVOT Vmax:         60.70 cm/s LVOT Vmean:        41.100 cm/s LVOT VTI:  0.125 m LVOT/AV VTI ratio: 0.34  AORTA Ao Root diam: 2.40 cm MITRAL VALVE                TRICUSPID VALVE MV Area (PHT): 3.46 cm     TR Peak grad:   36.5 mmHg MV Decel Time: 219 msec     TR Vmax:        302.00 cm/s MV E velocity: 122.00 cm/s                             SHUNTS                             Systemic VTI:  0.12 m                             Systemic Diam: 2.00 cm Julien Nordmann MD Electronically signed by Julien Nordmann MD Signature Date/Time: 11/28/2022/1:06:41 PM    Final      Family Communication:    Disposition: Status is: Inpatient Remains inpatient appropriate because: AKI, UTI  Planned Discharge Destination: Rehab      Author: Darlin Priestly,  MD 11/29/2022 9:36 PM Secure chat 7am to 7pm For on call review [email or web address].

## 2022-11-29 NOTE — Care Management Important Message (Signed)
Important Message  Patient Details  Name: Evelyn Simmons MRN: [id number] Date of Birth: 1930/02/08   Important Message Given:  N/A - LOS <3 / Initial given by admissions     Olegario Messier A Amanda Steuart 11/29/2022, 7:58 AM

## 2022-11-29 NOTE — Plan of Care (Signed)
  Problem: Education: Goal: Knowledge of General Education information will improve Description: Including pain rating scale, medication(s)/side effects and non-pharmacologic comfort measures Outcome: Progressing   Problem: Clinical Measurements: Goal: Ability to maintain clinical measurements within normal limits will improve Outcome: Progressing   Problem: Activity: Goal: Risk for activity intolerance will decrease Outcome: Progressing   Problem: Coping: Goal: Level of anxiety will decrease Outcome: Progressing   Problem: Pain Managment: Goal: General experience of comfort will improve Outcome: Progressing   Problem: Clinical Measurements: Goal: Postoperative complications will be avoided or minimized Outcome: Progressing

## 2022-11-29 NOTE — TOC Progression Note (Addendum)
Transition of Care [facility]) - Progression Note    Patient Details  Name: Evelyn Simmons MRN: [id number] Date of Birth: Jun 13, 1929  [facility] CM/SW Contact  Marlowe Sax, RN Phone Number: 11/29/2022, 3:49 PM  Clinical Narrative:     Called [facility] and spoke to Zella Ball She resides at independent living at [facility] works with [facility]  [employer] Agreeable to a bed search        Expected Discharge Plan and Services                                               Social Determinants of Health (SDOH) Interventions SDOH Screenings   Food Insecurity: No Food Insecurity (11/27/2022)  Housing: Low Risk  (11/27/2022)  Transportation Needs: No Transportation Needs (11/27/2022)  Utilities: Not At Risk (11/27/2022)  Financial Resource Strain: Low Risk  (11/06/2017)   Received from [facility], [facility]  Physical Activity: Unknown (11/06/2017)   Received from [facility], [facility]  Social Connections: Unknown (11/06/2017)   Received from [facility], [facility]  Tobacco Use: Medium Risk (11/27/2022)  Health Literacy: Low Risk  (05/20/2020)   Received from [facility], [facility]    Readmission Risk Interventions     No data to display

## 2022-11-29 NOTE — TOC Initial Note (Signed)
Transition of Care [facility]) - Initial/Assessment Note    Patient Details  Name: Evelyn Simmons MRN: [id number] Date of Birth: [birth year]  Transition of Care [facility]) CM/SW Contact:    Marlowe Sax, RN Phone Number: 11/29/2022, 3:55 PM  Clinical Narrative:                 Called [facility] and spoke to Zella Ball She resides at independent living at [facility] works with [facility]  [employer] Agreeable to a bed search         Patient Goals and CMS Choice            Expected Discharge Plan and Services                                              Prior Living Arrangements/Services                       Activities of Daily Living   ADL Screening (condition at time of admission) Independently performs ADLs?: No Does the patient have a NEW difficulty with bathing/dressing/toileting/self-feeding that is expected to last >3 days?: No Does the patient have a NEW difficulty with getting in/out of bed, walking, or climbing stairs that is expected to last >3 days?: No Does the patient have a NEW difficulty with communication that is expected to last >3 days?: No Is the patient deaf or have difficulty hearing?: No Does the patient have difficulty seeing, even when wearing glasses/contacts?: Yes Does the patient have difficulty concentrating, remembering, or making decisions?: No  Permission Sought/Granted                  Emotional Assessment              Admission diagnosis:  AKI (acute kidney injury) (HCC) [N17.9] Cystitis [N30.90] Fall in home, initial encounter [W19.Lorne Skeens, Y92.009] Chronic dementia (HCC) [F03.90] Patient Active Problem List   Diagnosis Date Noted   Protein-calorie malnutrition, severe 11/28/2022   Chronic dementia (HCC) 11/28/2022   Fall at home 11/28/2022   Acute renal failure with acute tubular necrosis superimposed on stage 3 chronic kidney disease (HCC) 11/27/2022   UTI (urinary tract infection) 11/27/2022    Falling 11/27/2022   Anemia of chronic disease 11/27/2022   Depression 11/27/2022   Elevated troponin 11/27/2022   NSVT (nonsustained ventricular tachycardia) (HCC) 01/22/2022   Syncope and collapse    Hypotension 10/12/2021   Septic shock (HCC)    Cystitis    Complete heart block (HCC) 12/13/2019   Black stools    Acute on chronic heart failure with preserved ejection fraction (HFpEF) (HCC)    Acute on chronic systolic CHF (congestive heart failure) (HCC) 10/18/2019   Type 2 diabetes mellitus with obesity (HCC) 10/18/2019   Anemia due to chronic kidney disease 10/18/2019   COPD (chronic obstructive pulmonary disease) (HCC) 10/18/2019   Toe pain, right 10/18/2019   Persistent/Permanent atrial fibrillation (HCC)    Acute CHF (congestive heart failure) (HCC) 10/04/2019   Chronic respiratory failure with hypoxia (HCC)    CHF (congestive heart failure) (HCC) 09/18/2019   Atrial fibrillation with rapid ventricular response (HCC) 09/17/2019   Acute diastolic CHF (congestive heart failure) (HCC) 09/17/2019   Mobility impaired 05/29/2014   CAD (coronary artery disease) 05/09/2014   History of coronary artery stent placement 05/09/2014   Hyperlipidemia  05/09/2014   Bilateral low back pain with left-sided sciatica 04/21/2014   Knee effusion 04/21/2014   CKD stage 3 due to type 2 diabetes mellitus (HCC) 04/21/2014   Pacemaker 04/21/2014   Encounter to establish care 04/20/2014   PCP:  [facility] [facility] [employer] Pharmacy:   [facility] [place], [place] - [address] [place] [address] Phone: [phone] Fax: [phone]     Social Determinants of Health (SDOH) Social History: SDOH Screenings   Food Insecurity: No Food Insecurity (11/27/2022)  Housing: Low Risk  (11/27/2022)  Transportation Needs: No Transportation Needs (11/27/2022)  Utilities: Not At Risk (11/27/2022)  Financial Resource Strain: Low Risk  (11/06/2017)   Received from [facility], [facility]  Physical Activity: Unknown (11/06/2017)   Received from [facility], [facility]  Social Connections: Unknown (11/06/2017)   Received from [facility], [facility]  Tobacco Use: Medium Risk (11/27/2022)  Health Literacy: Low Risk  (05/20/2020)   Received from [facility], [facility]   SDOH Interventions:     Readmission Risk Interventions     No data to display

## 2022-11-29 NOTE — Plan of Care (Signed)
[facility] Tonsillectomy/Adenoidectomy, Postoperative PEDS care plan entered in error.

## 2022-11-29 NOTE — Evaluation (Signed)
Physical Therapy Evaluation Patient Details Name: Evelyn Simmons MRN: [id number] DOB: 1929/12/11 Today's Date: 11/29/2022  History of Present Illness  Pt is a 89 y.o. female with history of dementia, history of COPD, CKD 3B, GERD, insomnia, hyperlipidemia, depression, anxiety, hypertension, who presents to the ED 11/27/22 for chief concerns of increasing fall and altered mental status.  Clinical Impression  Pt is received in bed, she is agreeable to PT session. At baseline, Pt reports ambulating with RW, having assistance for IADLs, on 2L [place], and recent notice of BLE weakness. Pt performs bed mobility minA and transfers CGA-minA. Pt able to step pivot transfer from bed to recliner using RW progressing from minA to CGA. Pt unable to tolerate sitting in recliner for 3-4 minutes without reporting L leg and rectal pain. Unable to further assess amb at this time secondary to LLE pain. Pt would benefit from skilled PT to address above deficits and promote optimal return to PLOF.      If plan is discharge home, recommend the following: A little help with walking and/or transfers;A little help with bathing/dressing/bathroom;Assistance with cooking/housework;Assist for transportation;Help with stairs or ramp for entrance;Supervision due to cognitive status   Can travel by private vehicle   No    Equipment Recommendations Other (comment) (TBD at next facility)  Recommendations for Other Services       Functional Status Assessment Patient has had a recent decline in their functional status and demonstrates the ability to make significant improvements in function in a reasonable and predictable amount of time.     Precautions / Restrictions Precautions Precautions: Fall Restrictions Weight Bearing Restrictions: No      Mobility  Bed Mobility Overal bed mobility: Needs Assistance Bed Mobility: Supine to Sit, Sit to Supine     Supine to sit: Min assist, HOB elevated, Used rails Sit to supine:  Used rails, Min assist   General bed mobility comments: able to perform bed mobility (supine>sit) minA for trunk management and (sit>supine) for BLE management    Transfers Overall transfer level: Needs assistance Equipment used: Rolling walker (2 wheels) Transfers: Bed to chair/wheelchair/BSC     Step pivot transfers: Contact guard assist, Min assist       General transfer comment: Pt able to perform step pivot transfer from bed to recliner minA with progression towards CGA during stepping    Ambulation/Gait               General Gait Details: Deferred due to reports of LLE pain during seated in recliner  Stairs            Wheelchair Mobility     Tilt Bed    Modified Rankin (Stroke Patients Only)       Balance Overall balance assessment: History of Falls, Needs assistance Sitting-balance support: Bilateral upper extremity supported, Feet unsupported Sitting balance-Leahy Scale: Fair Sitting balance - Comments: Pt requires BUE support during seated EOB balance during functional activities   Standing balance support: Bilateral upper extremity supported, During functional activity Standing balance-Leahy Scale: Good Standing balance comment: Pt able to maintain static standing balance using RW during functional activities                             Pertinent Vitals/Pain Pain Assessment Pain Assessment: Faces Faces Pain Scale: Hurts even more Pain Location: L leg during sitting Pain Descriptors / Indicators: Discomfort, Sore Pain Intervention(s): Limited activity within patient's tolerance, Repositioned    Home  Living Family/patient expects to be discharged to:: Assisted living Living Arrangements: Other (Comment) (aides assist) Available Help at Discharge: Available PRN/intermittently;Family;Personal care attendant Type of Home: Other(Comment) (ALF)           Home Equipment: Rolling Walker (2 wheels) Additional Comments: has aides  who assist with IADLs, daughter gets groceries    Prior Function               Mobility Comments: uses RW for amb ADLs Comments: requires assist with IADLs by aides and daughter     Extremity/Trunk Assessment   Upper Extremity Assessment Upper Extremity Assessment: Overall WFL for tasks assessed    Lower Extremity Assessment Lower Extremity Assessment: Generalized weakness       Communication   Communication Communication: No apparent difficulties Cueing Techniques: Verbal cues  Cognition Arousal: Alert Behavior During Therapy: WFL for tasks assessed/performed Overall Cognitive Status: Within Functional Limits for tasks assessed                                 General Comments: AO x3 with disorientation to month; Pleasant and cooperative with PT session        General Comments      Exercises Other Exercises Other Exercises: Educated Pt on PT role and benefits of OOB mobility   Assessment/Plan    PT Assessment Patient needs continued PT services  PT Problem List Decreased strength;Decreased mobility;Decreased range of motion;Decreased activity tolerance;Decreased balance;Decreased coordination;Decreased cognition;Pain       PT Treatment Interventions DME instruction;Therapeutic exercise;Balance training;Gait training;Functional mobility training;Therapeutic activities    PT Goals (Current goals can be found in the Care Plan section)  Acute Rehab PT Goals Patient Stated Goal: to get stronger and reduce falls PT Goal Formulation: With patient Time For Goal Achievement: 12/13/22 Potential to Achieve Goals: Good    Frequency Min 1X/week     Co-evaluation               AM-PAC PT "6 Clicks" Mobility  Outcome Measure Help needed turning from your back to your side while in a flat bed without using bedrails?: A Little Help needed moving from lying on your back to sitting on the side of a flat bed without using bedrails?: A Little Help  needed moving to and from a bed to a chair (including a wheelchair)?: A Little Help needed standing up from a chair using your arms (e.g., wheelchair or bedside chair)?: A Little Help needed to walk in hospital room?: A Lot Help needed climbing 3-5 steps with a railing? : A Lot 6 Click Score: 16    End of Session Equipment Utilized During Treatment: Gait belt;Oxygen Activity Tolerance: Patient tolerated treatment well Patient left: in bed;with call bell/phone within reach;with bed alarm set Nurse Communication: Mobility status PT Visit Diagnosis: Unsteadiness on feet (R26.81);Muscle weakness (generalized) (M62.81);History of falling (Z91.81);Pain Pain - Right/Left: Left Pain - part of body: Leg    Time: 1415-1440 PT Time Calculation (min) (ACUTE ONLY): 25 min   Charges:                 Rosslyn Else, SPT   Adorian Gwynne 11/29/2022, 3:43 PM

## 2022-11-30 DIAGNOSIS — N170 Acute kidney failure with tubular necrosis: Secondary | ICD-10-CM | POA: Diagnosis not present

## 2022-11-30 DIAGNOSIS — N1832 Chronic kidney disease, stage 3b: Secondary | ICD-10-CM | POA: Diagnosis not present

## 2022-11-30 LAB — CBC
HCT: 29 % — ABNORMAL LOW (ref 36.0–46.0)
Hemoglobin: 8.5 g/dL — ABNORMAL LOW (ref 12.0–15.0)
MCH: 25.8 pg — ABNORMAL LOW (ref 26.0–34.0)
MCHC: 29.3 g/dL — ABNORMAL LOW (ref 30.0–36.0)
MCV: 88.1 fL (ref 80.0–100.0)
Platelets: 309 10*3/uL (ref 150–400)
RBC: 3.29 MIL/uL — ABNORMAL LOW (ref 3.87–5.11)
RDW: 20 % — ABNORMAL HIGH (ref 11.5–15.5)
WBC: 10.9 10*3/uL — ABNORMAL HIGH (ref 4.0–10.5)
nRBC: 0 % (ref 0.0–0.2)

## 2022-11-30 LAB — BASIC METABOLIC PANEL
Anion gap: 11 (ref 5–15)
BUN: 107 mg/dL — ABNORMAL HIGH (ref 8–23)
CO2: 27 mmol/L (ref 22–32)
Calcium: 11.5 mg/dL — ABNORMAL HIGH (ref 8.9–10.3)
Chloride: 100 mmol/L (ref 98–111)
Creatinine, Ser: 1.63 mg/dL — ABNORMAL HIGH (ref 0.44–1.00)
GFR, Estimated: 30 mL/min — ABNORMAL LOW (ref >60)
Glucose, Bld: 137 mg/dL — ABNORMAL HIGH (ref 70–99)
Potassium: 3.5 mmol/L (ref 3.5–5.1)
Sodium: 138 mmol/L (ref 135–145)

## 2022-11-30 LAB — MAGNESIUM: Magnesium: 2.2 mg/dL (ref 1.7–2.4)

## 2022-11-30 MED ORDER — CEPHALEXIN 500 MG PO CAPS
500.0000 mg | ORAL_CAPSULE | Freq: Two times a day (BID) | ORAL | Status: AC
  Administered 2022-11-30 – 2022-12-01 (×4): 500 mg via ORAL
  Filled 2022-11-30 (×4): qty 1

## 2022-11-30 NOTE — Plan of Care (Signed)

## 2022-11-30 NOTE — Plan of Care (Signed)

## 2022-11-30 NOTE — Progress Notes (Addendum)
Progress Note    Evelyn Simmons  ZOX:[id number] DOB: October 02, 1929  DOA: 11/27/2022 PCP: [facility]      Brief Narrative:    Medical records reviewed and are as summarized below:  Evelyn Simmons is a 89 y.o. female with history of dementia, history of COPD, CKD 3B, GERD, insomnia, hyperlipidemia, depression, anxiety, hypertension, who was brought to the hospital because of confusion and increasing falls.  She was found to have AKI and acute UTI.      Assessment/Plan:   Principal Problem:   Acute renal failure with acute tubular necrosis superimposed on stage 3 chronic kidney disease (HCC) Active Problems:   UTI (urinary tract infection)   CAD (coronary artery disease)   Hyperlipidemia   CHF (congestive heart failure) (HCC)   Chronic respiratory failure with hypoxia (HCC)   Cystitis   Falling   Anemia of chronic disease   Depression   Elevated troponin   Protein-calorie malnutrition, severe   Chronic dementia (HCC)   Fall at home   Nutrition Problem: Severe Malnutrition Etiology: chronic illness (dementia)  Signs/Symptoms: moderate fat depletion, severe fat depletion, moderate muscle depletion, severe muscle depletion   Body mass index is 25.72 kg/m.    AKI on CKD stage IIIb: Creatinine has improved.  Discontinue IV fluids.   Acute UTI: Urine culture showed E. coli..  Discontinue IV ceftriaxone and treat with Keflex for 2 more days to complete 5 days of antibiotics   Elevated troponins: This is likely from demand ischemia.  No further cardiac workup per cardiologist.   Chronic systolic CHF, valvular heart disease: Compensated.  2D echo on 11/28/2022 showed EF estimated at 45 to 50%, mild LVH, moderately reduced RV, moderate MR, mild AR and mild AS.Marland Kitchen  Per cardiologist, diuretics should be discontinued long-term.   COPD, chronic hypoxemic respiratory failure: Continue bronchodilators and 2 L/min oxygen via [place].   General Weakness,  falls: PT recommended discharge to SNF.  Follow-up with TOC to assist with disposition.   Other comorbidities include severe protein calorie malnutrition, anemia of chronic disease, depression, s/p permanent pacemaker    Diet Order             Diet regular Fluid consistency: Thin  Diet effective now                            Consultants: Cardiologist  Procedures: None    Medications:    ascorbic acid  250 mg Oral Daily   cephALEXin  500 mg Oral Q12H   famotidine  20 mg Oral Daily   feeding supplement  237 mL Oral TID BM   ferrous sulfate  325 mg Oral Q breakfast   heparin  5,000 Units Subcutaneous Q8H   multivitamin with minerals  1 tablet Oral Daily   QUEtiapine  100 mg Oral QHS   sertraline  50 mg Oral Daily   Continuous Infusions:   Anti-infectives (From admission, onward)    Start     Dose/Rate Route Frequency Ordered Stop   11/30/22 1000  cephALEXin (KEFLEX) capsule 500 mg        500 mg Oral Every 12 hours 11/30/22 0818 12/02/22 0959   11/28/22 1000  cefTRIAXone (ROCEPHIN) 2 g in sodium chloride 0.9 % 100 mL IVPB  Status:  Discontinued        2 g 200 mL/hr over 30 Minutes Intravenous Every 24 hours 11/27/22 1347 11/30/22 0818   11/27/22 1115  cefTRIAXone (ROCEPHIN) 2 g in sodium chloride 0.9 % 100 mL IVPB        2 g 200 mL/hr over 30 Minutes Intravenous  Once 11/27/22 1101 11/27/22 1301              Family Communication/Anticipated D/C date and plan/Code Status   DVT prophylaxis: heparin injection 5,000 Units Start: 11/27/22 2200 Place TED hose Start: 11/27/22 1342     Code Status: Full Code  Family Communication: Plan discussed with Glena Norfolk, daughter Disposition Plan: Plan to discharge to SNF   Status is: Inpatient Remains inpatient appropriate because: Awaiting placement to SNF       Subjective:   Events noted.  She has no complaints.  No chest pain or shortness of breath.  Objective:    Vitals:   11/29/22 0758  11/29/22 1525 11/29/22 2320 11/30/22 0801  BP: (!) 115/47 (!) 114/52 (!) 115/40 (!) 127/45  Pulse: 69 69 70 72  Resp: 14 14 18 18   Temp: 98 F (36.7 C) 98.3 F (36.8 C) 98.7 F (37.1 C) 97.7 F (36.5 C)  TempSrc:    Oral  SpO2: 100% 96% 95% 94%  Weight:      Height:       No data found.   Intake/Output Summary (Last 24 hours) at 11/30/2022 1158 Last data filed at 11/30/2022 8119 Gross per 24 hour  Intake 0 ml  Output 750 ml  Net -750 ml   Filed Weights   11/27/22 0823  Weight: 61.7 kg    Exam:  GEN: NAD SKIN: Warm and dry EYES: No pallor or icterus ENT: MMM CV: RRR PULM: CTA B ABD: soft, ND, NT, +BS CNS: AAO x 2 (person and place), non focal EXT: No edema or tenderness      Data Reviewed:   I have personally reviewed following labs and imaging studies:  Labs: Labs show the following:   Basic Metabolic Panel: Recent Labs  Lab 11/27/22 0917 11/28/22 0557 11/30/22 0533  NA 137 138 138  K 4.1 3.7 3.5  CL 95* 98 100  CO2 27 27 27   GLUCOSE 138* 98 137*  BUN 121* 111* 107*  CREATININE 2.64* 2.24* 1.63*  CALCIUM 11.8* 11.0* 11.5*  MG  --   --  2.2   GFR Estimated Creatinine Clearance: 19 mL/min (A) (by C-G formula based on SCr of 1.63 mg/dL (H)). Liver Function Tests: Recent Labs  Lab 11/27/22 0917  AST 17  ALT 10  ALKPHOS 50  BILITOT 0.7  PROT 7.3  ALBUMIN 3.3*   Recent Labs  Lab 11/27/22 0917  LIPASE 33   No results for input(s): "AMMONIA" in the last 168 hours. Coagulation profile No results for input(s): "INR", "PROTIME" in the last 168 hours.  CBC: Recent Labs  Lab 11/27/22 0917 11/28/22 0557 11/30/22 0533  WBC 12.2* 9.3 10.9*  NEUTROABS 10.2*  --   --   HGB 9.5* 8.9* 8.5*  HCT 31.8* 29.4* 29.0*  MCV 86.4 85.7 88.1  PLT 379 334 309   Cardiac Enzymes: Recent Labs  Lab 11/27/22 0917  CKTOTAL 74   BNP (last 3 results) No results for input(s): "PROBNP" in the last 8760 hours. CBG: No results for input(s):  "GLUCAP" in the last 168 hours. D-Dimer: No results for input(s): "DDIMER" in the last 72 hours. Hgb A1c: No results for input(s): "HGBA1C" in the last 72 hours. Lipid Profile: No results for input(s): "CHOL", "HDL", "LDLCALC", "TRIG", "CHOLHDL", "LDLDIRECT" in the last 72 hours. Thyroid  function studies: No results for input(s): "TSH", "T4TOTAL", "T3FREE", "THYROIDAB" in the last 72 hours.  Invalid input(s): "FREET3" Anemia work up: No results for input(s): "VITAMINB12", "FOLATE", "FERRITIN", "TIBC", "IRON", "RETICCTPCT" in the last 72 hours. Sepsis Labs: Recent Labs  Lab 11/27/22 0917 11/27/22 1352 11/27/22 1834 11/28/22 0557 11/30/22 0533  WBC 12.2*  --   --  9.3 10.9*  LATICACIDVEN  --  0.9 1.4  --   --     Microbiology Recent Results (from the past 240 hour(s))  Urine Culture     Status: Abnormal   Collection Time: 11/27/22  8:17 AM   Specimen: Urine, Clean Catch  Result Value Ref Range Status   Specimen Description   Final    URINE, CLEAN CATCH Performed at [facility], [address] [place], [place] [address]    Special Requests   Final    NONE Reflexed from [id number] Performed at [facility], [address] [place], [place] [address]    Culture >=100,000 COLONIES/mL ESCHERICHIA COLI (A)  Final   Report Status 11/29/2022 FINAL  Final   Organism ID, Bacteria ESCHERICHIA COLI (A)  Final      Susceptibility   Escherichia coli - MIC*    AMPICILLIN >=32 RESISTANT Resistant     CEFAZOLIN 8 SENSITIVE Sensitive     CEFEPIME <=0.12 SENSITIVE Sensitive     CEFTRIAXONE <=0.25 SENSITIVE Sensitive     CIPROFLOXACIN 1 RESISTANT Resistant     GENTAMICIN 4 SENSITIVE Sensitive     IMIPENEM <=0.25 SENSITIVE Sensitive     NITROFURANTOIN <=16 SENSITIVE Sensitive     TRIMETH/SULFA >=320 RESISTANT Resistant     AMPICILLIN/SULBACTAM >=32 RESISTANT Resistant     PIP/TAZO 64 INTERMEDIATE Intermediate ug/mL    * >=100,000 COLONIES/mL ESCHERICHIA COLI  SARS  Coronavirus 2 by RT PCR (hospital order, performed in [facility] hospital lab) *cepheid single result test* Anterior Nasal Swab     Status: None   Collection Time: 11/27/22  9:17 AM   Specimen: Anterior Nasal Swab  Result Value Ref Range Status   SARS Coronavirus 2 by RT PCR NEGATIVE NEGATIVE Final    Comment: (NOTE) SARS-CoV-2 target nucleic acids are NOT DETECTED.  The SARS-CoV-2 RNA is generally detectable in upper and lower respiratory specimens during the acute phase of infection. The lowest concentration of SARS-CoV-2 viral copies this assay can detect is 250 copies / mL. A negative result does not preclude SARS-CoV-2 infection and should not be used as the sole basis for treatment or other patient management decisions.  A negative result may occur with improper specimen collection / handling, submission of specimen other than nasopharyngeal swab, presence of viral mutation(s) within the areas targeted by this assay, and inadequate number of viral copies (<250 copies / mL). A negative result must be combined with clinical observations, patient history, and epidemiological information.  Fact Sheet for Patients:   RoadLapTop.co.za  Fact Sheet for Healthcare Providers: http://kim-miller.com/  This test is not yet approved or  cleared by the [place] FDA and has been authorized for detection and/or diagnosis of SARS-CoV-2 by FDA under an Emergency Use Authorization (EUA).  This EUA will remain in effect (meaning this test can be used) for the duration of the COVID-19 declaration under Section 564(b)(1) of the Act, 21 U.S.C. section 360bbb-3(b)(1), unless the authorization is terminated or revoked sooner.  Performed at [facility], [address], [place], [place] [address]     Procedures and diagnostic studies:  DG Knee 1-2 Views Left  Result Date: 11/28/2022 CLINICAL DATA:  Knee swelling after fall EXAM: LEFT  KNEE - 1-2 VIEW COMPARISON:  None Available. FINDINGS: Status post left total knee arthroplasty. Arthroplasty components are in their expected alignment without periprosthetic lucency or fracture. No knee joint effusion. Soft tissue swelling is present medially. Advanced atherosclerotic vascular calcification. IMPRESSION: Status post left total knee arthroplasty without evidence of hardware complication. No acute findings. Electronically Signed   By: Duanne Guess D.O.   On: 11/28/2022 19:58   DG Knee 1-2 Views Right  Result Date: 11/28/2022 CLINICAL DATA:  Knee swelling, fall EXAM: RIGHT KNEE - 1-2 VIEW COMPARISON:  None Available. FINDINGS: Status post right total knee arthroplasty. Arthroplasty components are in their expected alignment without periprosthetic lucency or fracture. No joint effusion. No focal soft tissue swelling. Advanced atherosclerotic vascular calcification. IMPRESSION: Status post right total knee arthroplasty without evidence of hardware complication. Electronically Signed   By: Duanne Guess D.O.   On: 11/28/2022 19:56               LOS: 3 days   Zakye Baby  [facility]   Pager on [email or web address]. If 7PM-7AM, please contact night-coverage at [email or web address]     11/30/2022, 11:58 AM

## 2022-12-01 DIAGNOSIS — N170 Acute kidney failure with tubular necrosis: Secondary | ICD-10-CM | POA: Diagnosis not present

## 2022-12-01 DIAGNOSIS — N1832 Chronic kidney disease, stage 3b: Secondary | ICD-10-CM | POA: Diagnosis not present

## 2022-12-01 LAB — BASIC METABOLIC PANEL
Anion gap: 10 (ref 5–15)
BUN: 97 mg/dL — ABNORMAL HIGH (ref 8–23)
CO2: 28 mmol/L (ref 22–32)
Calcium: 11.6 mg/dL — ABNORMAL HIGH (ref 8.9–10.3)
Chloride: 100 mmol/L (ref 98–111)
Creatinine, Ser: 1.36 mg/dL — ABNORMAL HIGH (ref 0.44–1.00)
GFR, Estimated: 37 mL/min — ABNORMAL LOW (ref >60)
Glucose, Bld: 138 mg/dL — ABNORMAL HIGH (ref 70–99)
Potassium: 3.9 mmol/L (ref 3.5–5.1)
Sodium: 138 mmol/L (ref 135–145)

## 2022-12-01 LAB — CBC
HCT: 28.9 % — ABNORMAL LOW (ref 36.0–46.0)
Hemoglobin: 8.5 g/dL — ABNORMAL LOW (ref 12.0–15.0)
MCH: 26.1 pg (ref 26.0–34.0)
MCHC: 29.4 g/dL — ABNORMAL LOW (ref 30.0–36.0)
MCV: 88.7 fL (ref 80.0–100.0)
Platelets: 299 10*3/uL (ref 150–400)
RBC: 3.26 MIL/uL — ABNORMAL LOW (ref 3.87–5.11)
RDW: 20.2 % — ABNORMAL HIGH (ref 11.5–15.5)
WBC: 10.1 10*3/uL (ref 4.0–10.5)
nRBC: 0 % (ref 0.0–0.2)

## 2022-12-01 LAB — MAGNESIUM: Magnesium: 2.4 mg/dL (ref 1.7–2.4)

## 2022-12-01 MED ORDER — OXYCODONE HCL 5 MG PO TABS
5.0000 mg | ORAL_TABLET | Freq: Two times a day (BID) | ORAL | Status: DC | PRN
  Administered 2022-12-01: 5 mg via ORAL
  Filled 2022-12-01 (×2): qty 1

## 2022-12-01 NOTE — Plan of Care (Signed)

## 2022-12-01 NOTE — Progress Notes (Addendum)
Physical Therapy Treatment Patient Details Name: Evelyn Simmons MRN: [id number] DOB: 11/10/1929 Today's Date: 12/01/2022   History of Present Illness Pt is a 89 y.o. female with history of dementia, history of COPD, CKD 3B, GERD, insomnia, hyperlipidemia, depression, anxiety, hypertension, who presents to the ED 11/27/22 for chief concerns of increasing fall and altered mental status.    PT Comments  Pt in bed, daughter in room.  She transitions to sitting with use of bed features and min a x 1.  Painful R knee, daughter stated L is usually the side that bothers her.  She is generally steady in sitting.  Stands after several attempts with min/mod a x 1 due to significant unsteadiness when standing and post LOB. Time is given to allow her to gain her balance.  She is able to take about 5 steps along EOB with a turn with min/mod a x 1 due to fluctuations in balance.  She sits on EOB due to fatigue.  After short rest, she stands again with mod  x 1 for balance and gets the "trembles" and sits uncontrolled on the bed.  Fall would have been likely if no intervention given.  She is unsafe to transfer on her own at this time.  She stated she gets "the trembles" often at home which seems to be related to fear of falling.  She returns to supine with min a x 1.   Discussed with daughter in room.  She is concerned over her return to home and would like to persue rehab at this time.  Given mobility and fall risk, she would benefit from increased strengthening for safe mobility at home.  Pain and fear do seem to be barriers for safe mobility..  She is a Nurse, learning disability.     If plan is discharge home, recommend the following: Assistance with cooking/housework;Assist for transportation;Help with stairs or ramp for entrance;Supervision due to cognitive status;A lot of help with walking and/or transfers;A lot of help with bathing/dressing/bathroom   Can travel by private vehicle        Equipment Recommendations        Recommendations for Other Services       Precautions / Restrictions Precautions Precautions: Fall Restrictions Weight Bearing Restrictions: No     Mobility  Bed Mobility Overal bed mobility: Needs Assistance Bed Mobility: Supine to Sit, Sit to Supine     Supine to sit: Min assist, HOB elevated, Used rails Sit to supine: Used rails, Min assist, Mod assist   General bed mobility comments: unable to complete on her own despite trying Patient Response: Cooperative  Transfers Overall transfer level: Needs assistance Equipment used: Rolling walker (2 wheels) Transfers: Sit to/from Stand Sit to Stand: Min assist, Mod assist                Ambulation/Gait Ambulation/Gait assistance: Mod assist, Min assist Gait Distance (Feet): 5 Feet Assistive device: Rolling walker (2 wheels) Gait Pattern/deviations: Step-to pattern Gait velocity: decreased     General Gait Details: small unsteady steps along bed limited by R knee pain and fear of falling   Stairs             Wheelchair Mobility     Tilt Bed Tilt Bed Patient Response: Cooperative  Modified Rankin (Stroke Patients Only)       Balance Overall balance assessment: History of Falls, Needs assistance Sitting-balance support: Bilateral upper extremity supported, Feet unsupported Sitting balance-Leahy Scale: Fair     Standing balance support: Bilateral upper  extremity supported, During functional activity Standing balance-Leahy Scale: Poor Standing balance comment: +1 hands on assist at all timed due to post lean and fear of falling which increases fall risk                            Cognition Arousal: Alert Behavior During Therapy: WFL for tasks assessed/performed Overall Cognitive Status: Within Functional Limits for tasks assessed                                          Exercises      General Comments        Pertinent Vitals/Pain Pain Assessment Pain  Assessment: Faces Faces Pain Scale: Hurts even more Pain Location: R knee and L skin tender Pain Descriptors / Indicators: Discomfort, Sore Pain Intervention(s): Limited activity within patient's tolerance, Repositioned    Home Living                          Prior Function            PT Goals (current goals can now be found in the care plan section) Progress towards PT goals: Progressing toward goals    Frequency    Min 1X/week      PT Plan      Co-evaluation              AM-PAC PT "6 Clicks" Mobility   Outcome Measure  Help needed turning from your back to your side while in a flat bed without using bedrails?: A Little Help needed moving from lying on your back to sitting on the side of a flat bed without using bedrails?: A Little Help needed moving to and from a bed to a chair (including a wheelchair)?: A Lot Help needed standing up from a chair using your arms (e.g., wheelchair or bedside chair)?: A Lot Help needed to walk in hospital room?: A Lot Help needed climbing 3-5 steps with a railing? : Total 6 Click Score: 13    End of Session Equipment Utilized During Treatment: Gait belt;Oxygen Activity Tolerance: Patient tolerated treatment well Patient left: in bed;with call bell/phone within reach;with bed alarm set;with family/visitor present Nurse Communication: Mobility status PT Visit Diagnosis: Unsteadiness on feet (R26.81);Muscle weakness (generalized) (M62.81);History of falling (Z91.81);Pain Pain - Right/Left: Left Pain - part of body: Leg     Time: 6962-9528 PT Time Calculation (min) (ACUTE ONLY): 19 min  Charges:    $Therapeutic Activity: 8-22 mins PT General Charges $$ ACUTE PT VISIT: 1 Visit                   Danielle Dess, PTA 12/01/22, 3:31 PM

## 2022-12-01 NOTE — Progress Notes (Signed)
Progress Note    Evelyn Simmons  UEA:[id number] DOB: 15-Apr-1929  DOA: 11/27/2022 PCP: [facility]      Brief Narrative:    Medical records reviewed and are as summarized below:  Evelyn Simmons is a 89 y.o. female with history of dementia, history of COPD, CKD 3B, GERD, insomnia, hyperlipidemia, depression, anxiety, hypertension, who was brought to the hospital because of confusion and increasing falls.  She was found to have AKI and acute UTI.      Assessment/Plan:   Principal Problem:   Acute renal failure with acute tubular necrosis superimposed on stage 3 chronic kidney disease (HCC) Active Problems:   UTI (urinary tract infection)   CAD (coronary artery disease)   Hyperlipidemia   CHF (congestive heart failure) (HCC)   Chronic respiratory failure with hypoxia (HCC)   Cystitis   Falling   Anemia of chronic disease   Depression   Elevated troponin   Protein-calorie malnutrition, severe   Chronic dementia (HCC)   Fall at home   Nutrition Problem: Severe Malnutrition Etiology: chronic illness (dementia)  Signs/Symptoms: moderate fat depletion, severe fat depletion, moderate muscle depletion, severe muscle depletion   Body mass index is 25.72 kg/m.    AKI on CKD stage IIIb:) Has improved to baseline.   Acute UTI: Urine culture showed E. coli..  Continue Keflex through 12/04/2022.  Initially received 3 days of IV ceftriaxone.   Elevated troponins: This is likely from demand ischemia.  No further cardiac workup per cardiologist.   Chronic systolic CHF, valvular heart disease: Compensated.  2D echo on 11/28/2022 showed EF estimated at 45 to 50%, mild LVH, moderately reduced RV, moderate MR, mild AR and mild AS.Marland Kitchen  Per cardiologist, diuretics should be discontinued long-term. Close outpatient follow-up to monitor for fluid buildup and to reassess need for diuretics.   COPD, chronic hypoxemic respiratory failure: Continue bronchodilators  and 2 L/min oxygen via [place].   General Weakness, falls: PT recommended discharge to SNF.  Follow-up with TOC to assist with disposition.   Other comorbidities include severe protein calorie malnutrition, anemia of chronic disease, depression, s/p permanent pacemaker    Diet Order             Diet regular Fluid consistency: Thin  Diet effective now                            Consultants: Cardiologist  Procedures: None    Medications:    ascorbic acid  250 mg Oral Daily   cephALEXin  500 mg Oral Q12H   famotidine  20 mg Oral Daily   feeding supplement  237 mL Oral TID BM   ferrous sulfate  325 mg Oral Q breakfast   heparin  5,000 Units Subcutaneous Q8H   multivitamin with minerals  1 tablet Oral Daily   QUEtiapine  100 mg Oral QHS   sertraline  50 mg Oral Daily   Continuous Infusions:   Anti-infectives (From admission, onward)    Start     Dose/Rate Route Frequency Ordered Stop   11/30/22 1000  cephALEXin (KEFLEX) capsule 500 mg        500 mg Oral Every 12 hours 11/30/22 0818 12/02/22 0959   11/28/22 1000  cefTRIAXone (ROCEPHIN) 2 g in sodium chloride 0.9 % 100 mL IVPB  Status:  Discontinued        2 g 200 mL/hr over 30 Minutes Intravenous Every 24 hours 11/27/22 1347  11/30/22 0818   11/27/22 1115  cefTRIAXone (ROCEPHIN) 2 g in sodium chloride 0.9 % 100 mL IVPB        2 g 200 mL/hr over 30 Minutes Intravenous  Once 11/27/22 1101 11/27/22 1301              Family Communication/Anticipated D/C date and plan/Code Status   DVT prophylaxis: heparin injection 5,000 Units Start: 11/27/22 2200 Place TED hose Start: 11/27/22 1342     Code Status: Full Code  Family Communication: None Disposition Plan: Plan to discharge to SNF   Status is: Inpatient Remains inpatient appropriate because: Awaiting placement to SNF       Subjective:   Interval events noted.  She complains of generalized body pain.   Objective:    Vitals:    11/30/22 0801 11/30/22 1556 11/30/22 2322 12/01/22 0756  BP: (!) 127/45 (!) 127/43 (!) 105/38 114/64  Pulse: 72 69 64 70  Resp: 18 18 18 15   Temp: 97.7 F (36.5 C) 98.3 F (36.8 C) 98.7 F (37.1 C) 98.1 F (36.7 C)  TempSrc: Oral Oral  Oral  SpO2: 94% 97% 95% 98%  Weight:      Height:       No data found.   Intake/Output Summary (Last 24 hours) at 12/01/2022 1129 Last data filed at 12/01/2022 0610 Gross per 24 hour  Intake --  Output 250 ml  Net -250 ml   Filed Weights   11/27/22 0823  Weight: 61.7 kg    Exam:  GEN: NAD SKIN: Warm and dry EYES: No pallor or icterus ENT: MMM CV: RRR PULM: CTA B ABD: soft, ND, NT, +BS CNS: AAO x 2 (person and place), non focal EXT: No edema or tenderness    Data Reviewed:   I have personally reviewed following labs and imaging studies:  Labs: Labs show the following:   Basic Metabolic Panel: Recent Labs  Lab 11/27/22 0917 11/28/22 0557 11/30/22 0533 12/01/22 0544  NA 137 138 138 138  K 4.1 3.7 3.5 3.9  CL 95* 98 100 100  CO2 27 27 27 28   GLUCOSE 138* 98 137* 138*  BUN 121* 111* 107* 97*  CREATININE 2.64* 2.24* 1.63* 1.36*  CALCIUM 11.8* 11.0* 11.5* 11.6*  MG  --   --  2.2 2.4   GFR Estimated Creatinine Clearance: 22.7 mL/min (A) (by C-G formula based on SCr of 1.36 mg/dL (H)). Liver Function Tests: Recent Labs  Lab 11/27/22 0917  AST 17  ALT 10  ALKPHOS 50  BILITOT 0.7  PROT 7.3  ALBUMIN 3.3*   Recent Labs  Lab 11/27/22 0917  LIPASE 33   No results for input(s): "AMMONIA" in the last 168 hours. Coagulation profile No results for input(s): "INR", "PROTIME" in the last 168 hours.  CBC: Recent Labs  Lab 11/27/22 0917 11/28/22 0557 11/30/22 0533 12/01/22 0544  WBC 12.2* 9.3 10.9* 10.1  NEUTROABS 10.2*  --   --   --   HGB 9.5* 8.9* 8.5* 8.5*  HCT 31.8* 29.4* 29.0* 28.9*  MCV 86.4 85.7 88.1 88.7  PLT 379 334 309 299   Cardiac Enzymes: Recent Labs  Lab 11/27/22 0917  CKTOTAL 74   BNP  (last 3 results) No results for input(s): "PROBNP" in the last 8760 hours. CBG: No results for input(s): "GLUCAP" in the last 168 hours. D-Dimer: No results for input(s): "DDIMER" in the last 72 hours. Hgb A1c: No results for input(s): "HGBA1C" in the last 72 hours. Lipid Profile:  No results for input(s): "CHOL", "HDL", "LDLCALC", "TRIG", "CHOLHDL", "LDLDIRECT" in the last 72 hours. Thyroid function studies: No results for input(s): "TSH", "T4TOTAL", "T3FREE", "THYROIDAB" in the last 72 hours.  Invalid input(s): "FREET3" Anemia work up: No results for input(s): "VITAMINB12", "FOLATE", "FERRITIN", "TIBC", "IRON", "RETICCTPCT" in the last 72 hours. Sepsis Labs: Recent Labs  Lab 11/27/22 0917 11/27/22 1352 11/27/22 1834 11/28/22 0557 11/30/22 0533 12/01/22 0544  WBC 12.2*  --   --  9.3 10.9* 10.1  LATICACIDVEN  --  0.9 1.4  --   --   --     Microbiology Recent Results (from the past 240 hour(s))  Urine Culture     Status: Abnormal   Collection Time: 11/27/22  8:17 AM   Specimen: Urine, Clean Catch  Result Value Ref Range Status   Specimen Description   Final    URINE, CLEAN CATCH Performed at [facility], [address] [place], [place] [address]    Special Requests   Final    NONE Reflexed from [id number] Performed at [facility], [address] [place], [place] [address]    Culture >=100,000 COLONIES/mL ESCHERICHIA COLI (A)  Final   Report Status 11/29/2022 FINAL  Final   Organism ID, Bacteria ESCHERICHIA COLI (A)  Final      Susceptibility   Escherichia coli - MIC*    AMPICILLIN >=32 RESISTANT Resistant     CEFAZOLIN 8 SENSITIVE Sensitive     CEFEPIME <=0.12 SENSITIVE Sensitive     CEFTRIAXONE <=0.25 SENSITIVE Sensitive     CIPROFLOXACIN 1 RESISTANT Resistant     GENTAMICIN 4 SENSITIVE Sensitive     IMIPENEM <=0.25 SENSITIVE Sensitive     NITROFURANTOIN <=16 SENSITIVE Sensitive     TRIMETH/SULFA >=320 RESISTANT Resistant      AMPICILLIN/SULBACTAM >=32 RESISTANT Resistant     PIP/TAZO 64 INTERMEDIATE Intermediate ug/mL    * >=100,000 COLONIES/mL ESCHERICHIA COLI  SARS Coronavirus 2 by RT PCR (hospital order, performed in [facility] hospital lab) *cepheid single result test* Anterior Nasal Swab     Status: None   Collection Time: 11/27/22  9:17 AM   Specimen: Anterior Nasal Swab  Result Value Ref Range Status   SARS Coronavirus 2 by RT PCR NEGATIVE NEGATIVE Final    Comment: (NOTE) SARS-CoV-2 target nucleic acids are NOT DETECTED.  The SARS-CoV-2 RNA is generally detectable in upper and lower respiratory specimens during the acute phase of infection. The lowest concentration of SARS-CoV-2 viral copies this assay can detect is 250 copies / mL. A negative result does not preclude SARS-CoV-2 infection and should not be used as the sole basis for treatment or other patient management decisions.  A negative result may occur with improper specimen collection / handling, submission of specimen other than nasopharyngeal swab, presence of viral mutation(s) within the areas targeted by this assay, and inadequate number of viral copies (<250 copies / mL). A negative result must be combined with clinical observations, patient history, and epidemiological information.  Fact Sheet for Patients:   RoadLapTop.co.za  Fact Sheet for Healthcare Providers: http://kim-miller.com/  This test is not yet approved or  cleared by the [place] FDA and has been authorized for detection and/or diagnosis of SARS-CoV-2 by FDA under an Emergency Use Authorization (EUA).  This EUA will remain in effect (meaning this test can be used) for the duration of the COVID-19 declaration under Section 564(b)(1) of the Act, 21 U.S.C. section 360bbb-3(b)(1), unless the authorization is terminated or revoked sooner.  Performed at [facility], [address] [place], [place] [address]      Procedures and diagnostic studies:  No results found.             LOS: 4 days   Sharai Overbay  [facility]   Pager on [email or web address]. If 7PM-7AM, please contact night-coverage at [email or web address]     12/01/2022, 11:29 AM

## 2022-12-02 DIAGNOSIS — N3000 Acute cystitis without hematuria: Secondary | ICD-10-CM | POA: Diagnosis not present

## 2022-12-02 DIAGNOSIS — J9611 Chronic respiratory failure with hypoxia: Secondary | ICD-10-CM | POA: Diagnosis not present

## 2022-12-02 DIAGNOSIS — W19XXXA Unspecified fall, initial encounter: Secondary | ICD-10-CM | POA: Diagnosis not present

## 2022-12-02 DIAGNOSIS — N170 Acute kidney failure with tubular necrosis: Secondary | ICD-10-CM | POA: Diagnosis not present

## 2022-12-02 LAB — MAGNESIUM: Magnesium: 2.5 mg/dL — ABNORMAL HIGH (ref 1.7–2.4)

## 2022-12-02 MED ORDER — TRAMADOL HCL ER 100 MG PO TB24: 100.0000 mg | ORAL_TABLET | Freq: Every day | ORAL | 0 refills | Status: DC

## 2022-12-02 MED ORDER — MAGIC MOUTHWASH W/LIDOCAINE: 5.0000 mL | Freq: Three times a day (TID) | ORAL | 0 refills | Status: DC

## 2022-12-02 MED ORDER — TRAMADOL HCL 50 MG PO TABS: 50.0000 mg | ORAL_TABLET | Freq: Every day | ORAL | 0 refills | Status: DC | PRN

## 2022-12-02 MED ORDER — ENSURE ENLIVE PO LIQD: 237.0000 mL | Freq: Three times a day (TID) | ORAL | 12 refills | Status: DC

## 2022-12-02 MED ORDER — OXYCODONE HCL 5 MG PO TABS
5.0000 mg | ORAL_TABLET | Freq: Two times a day (BID) | ORAL | Status: DC | PRN
  Administered 2022-12-02: 5 mg via ORAL

## 2022-12-02 MED ORDER — MAGIC MOUTHWASH W/LIDOCAINE
5.0000 mL | Freq: Three times a day (TID) | ORAL | Status: DC
  Administered 2022-12-02: 5 mL via ORAL
  Filled 2022-12-02 (×3): qty 5

## 2022-12-02 NOTE — TOC Progression Note (Signed)
Transition of Care [facility]) - Progression Note    Patient Details  Name: Millee Rysavy MRN: [id number] Date of Birth: 1929-03-26  [facility] of [facility]) CM/SW Contact  Marlowe Sax, RN Phone Number: 12/02/2022, 11:27 AM  Clinical Narrative:     Met with the patient and her daughter in the  room, I explained at this time the only [facility] contracted facility that we have a bed offer for is [facility], they accepted  the bed offer I contacted [facility]  and spoke nurse Kennyth Arnold and let them know of the bed acceptance [facility] will transport       Expected Discharge Plan and Services                                               Social Determinants of Health (SDOH) Interventions SDOH Screenings   Food Insecurity: No Food Insecurity (11/27/2022)  Housing: Low Risk  (11/27/2022)  Transportation Needs: No Transportation Needs (11/27/2022)  Utilities: Not At Risk (11/27/2022)  Financial Resource Strain: Low Risk  (11/06/2017)   Received from [facility], [facility]  Physical Activity: Unknown (11/06/2017)   Received from [facility], [facility]  Social Connections: Unknown (11/06/2017)   Received from [facility], [facility]  Tobacco Use: Medium Risk (11/27/2022)  Health Literacy: Low Risk  (05/20/2020)   Received from [facility], [facility]    Readmission Risk Interventions     No data to display

## 2022-12-02 NOTE — TOC Progression Note (Addendum)
Transition of Care [facility]) - Progression Note    Patient Details  Name: Evelyn Simmons MRN: [id number] Date of Birth: 1929-03-11  Transition of Care [facility]) CM/SW Contact  Marlowe Sax, RN Phone Number: 12/02/2022, 2:49 PM  Clinical Narrative:      Called [facility] to see if they have ETA on picking up the patient    Spoke with Emory the nurse at [facility], she is going to arrange transport to [facility] thru [facility] driver,  She will need to go via EMS they are low on drivers   Expected Discharge Plan and Services         Expected Discharge Date: 12/02/22                                     Social Determinants of Health (SDOH) Interventions SDOH Screenings   Food Insecurity: No Food Insecurity (11/27/2022)  Housing: Low Risk  (11/27/2022)  Transportation Needs: No Transportation Needs (11/27/2022)  Utilities: Not At Risk (11/27/2022)  Financial Resource Strain: Low Risk  (11/06/2017)   Received from [facility], [facility]  Physical Activity: Unknown (11/06/2017)   Received from [facility], [facility]  Social Connections: Unknown (11/06/2017)   Received from [facility], [facility]  Tobacco Use: Medium Risk (11/27/2022)  Health Literacy: Low Risk  (05/20/2020)   Received from [facility], [facility]    Readmission Risk Interventions     No data to display

## 2022-12-02 NOTE — Plan of Care (Signed)

## 2022-12-02 NOTE — Discharge Summary (Addendum)
Physician Discharge Summary   Patient: Evelyn Simmons MRN: [id number] DOB: 01/10/1930  Admit date:     11/27/2022  Discharge date: 12/02/22  Discharge Physician: Enedina Finner   PCP: [facility] [place]   Recommendations at discharge:    F/u PCP in 1-2 weeks  Discharge Diagnoses: Principal Problem:   Acute renal failure with acute tubular necrosis superimposed on stage 3 chronic kidney disease (HCC) Active Problems:   UTI (urinary tract infection)   CAD (coronary artery disease)   Hyperlipidemia   CHF (congestive heart failure) (HCC)   Chronic respiratory failure with hypoxia (HCC)   Cystitis   Falling   Anemia of chronic disease   Depression   Elevated troponin   Protein-calorie malnutrition, severe   Chronic dementia (HCC)   Fall at home   Evelyn Simmons is a 89 y.o. female with history of dementia, history of COPD, CKD 3B, GERD, insomnia, hyperlipidemia, depression, anxiety, hypertension, who was brought to the hospital because of confusion and increasing falls.  She was found to have AKI and acute UTI.     AKI on CKD stage IIIb --creat back to normal --encourage po intake    Acute UTI: Urine culture showed E. coli.. completed 5 days rx    Elevated troponins: This is likely from demand ischemia.  No further cardiac workup per cardiologist.    Chronic systolic CHF, valvular heart disease: Compensated.  2D echo on 11/28/2022 showed EF estimated at 45 to 50%, mild LVH, moderately reduced RV, moderate MR, mild AR and mild AS.Marland Kitchen  Per cardiologist, diuretics should be discontinued long-term. Close outpatient follow-up to monitor for fluid buildup and to reassess need for diuretics.    COPD, chronic hypoxemic respiratory failure: Continue bronchodilators and 2 L/min oxygen via [place]--chronic home oxygen   General Weakness, falls: PT recommended discharge to SNF.  Follow-up with TOC to assist with disposition.  Mouth sore--magic mouth wash   Nutrition Problem: Severe  Malnutrition Etiology: chronic illness (dementia)   Signs/Symptoms: moderate fat depletion, severe fat depletion, moderate muscle depletion, severe muscle depletion  Overall improving slowly. D/c to rehab  Spoke with dter at bedside    Pain control - [facility] Controlled Substance Reporting System database was reviewed. and patient was instructed, not to drive, operate heavy machinery, perform activities at heights, swimming or participation in water activities or provide baby-sitting services while on Pain, Sleep and Anxiety Medications; until their outpatient Physician has advised to do so again. Also recommended to not to take more than prescribed Pain, Sleep and Anxiety Medications.  Disposition: Rehabilitation facility Diet recommendation:  Discharge Diet Orders (From admission, onward)     Start     Ordered   12/02/22 0000  Diet - low sodium heart healthy        12/02/22 1210           Cardiac diet DISCHARGE MEDICATION: Allergies as of 12/02/2022       Reactions   Penicillins Nausea And Vomiting, Rash, Hives   Other Other (See Comments)   Sulfa Antibiotics    Pt unsure reaction, was told in hospital she had allergy   Penicillin G Rash        Medication List     STOP taking these medications    aspirin EC 81 MG tablet   cholecalciferol 1000 units tablet Commonly known as: VITAMIN D   cyanocobalamin 1000 MCG tablet   diclofenac Sodium 1 % Gel Commonly known as: VOLTAREN   furosemide 10 MG/ML injection  Physician Discharge Summary   Patient: Evelyn Simmons MRN: [id number] DOB: 01/10/1930  Admit date:     11/27/2022  Discharge date: 12/02/22  Discharge Physician: Enedina Finner   PCP: [facility] [place]   Recommendations at discharge:    F/u PCP in 1-2 weeks  Discharge Diagnoses: Principal Problem:   Acute renal failure with acute tubular necrosis superimposed on stage 3 chronic kidney disease (HCC) Active Problems:   UTI (urinary tract infection)   CAD (coronary artery disease)   Hyperlipidemia   CHF (congestive heart failure) (HCC)   Chronic respiratory failure with hypoxia (HCC)   Cystitis   Falling   Anemia of chronic disease   Depression   Elevated troponin   Protein-calorie malnutrition, severe   Chronic dementia (HCC)   Fall at home   Evelyn Simmons is a 89 y.o. female with history of dementia, history of COPD, CKD 3B, GERD, insomnia, hyperlipidemia, depression, anxiety, hypertension, who was brought to the hospital because of confusion and increasing falls.  She was found to have AKI and acute UTI.     AKI on CKD stage IIIb --creat back to normal --encourage po intake    Acute UTI: Urine culture showed E. coli.. completed 5 days rx    Elevated troponins: This is likely from demand ischemia.  No further cardiac workup per cardiologist.    Chronic systolic CHF, valvular heart disease: Compensated.  2D echo on 11/28/2022 showed EF estimated at 45 to 50%, mild LVH, moderately reduced RV, moderate MR, mild AR and mild AS.Marland Kitchen  Per cardiologist, diuretics should be discontinued long-term. Close outpatient follow-up to monitor for fluid buildup and to reassess need for diuretics.    COPD, chronic hypoxemic respiratory failure: Continue bronchodilators and 2 L/min oxygen via [place]--chronic home oxygen   General Weakness, falls: PT recommended discharge to SNF.  Follow-up with TOC to assist with disposition.  Mouth sore--magic mouth wash   Nutrition Problem: Severe  Malnutrition Etiology: chronic illness (dementia)   Signs/Symptoms: moderate fat depletion, severe fat depletion, moderate muscle depletion, severe muscle depletion  Overall improving slowly. D/c to rehab  Spoke with dter at bedside    Pain control - [facility] Controlled Substance Reporting System database was reviewed. and patient was instructed, not to drive, operate heavy machinery, perform activities at heights, swimming or participation in water activities or provide baby-sitting services while on Pain, Sleep and Anxiety Medications; until their outpatient Physician has advised to do so again. Also recommended to not to take more than prescribed Pain, Sleep and Anxiety Medications.  Disposition: Rehabilitation facility Diet recommendation:  Discharge Diet Orders (From admission, onward)     Start     Ordered   12/02/22 0000  Diet - low sodium heart healthy        12/02/22 1210           Cardiac diet DISCHARGE MEDICATION: Allergies as of 12/02/2022       Reactions   Penicillins Nausea And Vomiting, Rash, Hives   Other Other (See Comments)   Sulfa Antibiotics    Pt unsure reaction, was told in hospital she had allergy   Penicillin G Rash        Medication List     STOP taking these medications    aspirin EC 81 MG tablet   cholecalciferol 1000 units tablet Commonly known as: VITAMIN D   cyanocobalamin 1000 MCG tablet   diclofenac Sodium 1 % Gel Commonly known as: VOLTAREN   furosemide 10 MG/ML injection  as: ULTRAM-ER Take 1 tablet (100 mg total) by mouth daily.   traMADol 50 MG tablet Commonly known as: ULTRAM Take 1 tablet (50 mg total) by mouth daily as needed.   Tussin Cough+Chest Congest DM 10-100 MG/5ML liquid Generic drug: dextromethorphan-guaiFENesin Take 10 mLs by mouth at bedtime as needed for cough.        Contact information for follow-up providers     [facility] Schedule an appointment as soon as possible for a visit.   Contact information: [address] [place] [address] [phone]              Contact information for after-discharge care     Destination     [facility] Preferred SNF .   Service: Skilled Nursing Contact information: [address] [place] [address] [phone]                    Discharge Exam: Evelyn Simmons Weights   11/27/22 5784  Weight:  61.7 kg   Volney Presser Oral mucosa--sore on right floor of mouth Resp clear to auscultation Cv s1 s2 normal  Condition at discharge: fair  The results of significant diagnostics from this hospitalization (including imaging, microbiology, ancillary and laboratory) are listed below for reference.   Imaging Studies: DG Knee 1-2 Views Left  Result Date: 11/28/2022 CLINICAL DATA:  Knee swelling after fall EXAM: LEFT KNEE - 1-2 VIEW COMPARISON:  None Available. FINDINGS: Status post left total knee arthroplasty. Arthroplasty components are in their expected alignment without periprosthetic lucency or fracture. No knee joint effusion. Soft tissue swelling is present medially. Advanced atherosclerotic vascular calcification. IMPRESSION: Status post left total knee arthroplasty without evidence of hardware complication. No acute findings. Electronically Signed   By: Duanne Guess D.O.   On: 11/28/2022 19:58   DG Knee 1-2 Views Right  Result Date: 11/28/2022 CLINICAL DATA:  Knee swelling, fall EXAM: RIGHT KNEE - 1-2 VIEW COMPARISON:  None Available. FINDINGS: Status post right total knee arthroplasty. Arthroplasty components are in their expected alignment without periprosthetic lucency or fracture. No joint effusion. No focal soft tissue swelling. Advanced atherosclerotic vascular calcification. IMPRESSION: Status post right total knee arthroplasty without evidence of hardware complication. Electronically Signed   By: Duanne Guess D.O.   On: 11/28/2022 19:56   ECHOCARDIOGRAM COMPLETE  Result Date: 11/28/2022    ECHOCARDIOGRAM REPORT   Patient Name:   Evelyn Simmons Date of Exam: 11/28/2022 Medical Rec #:  [id number]     Height:       61.0 in Accession #:    [id number]    Weight:       136.1 lb Date of Birth:  11-28-1929      BSA:          1.604 m Patient Age:    89 years      BP:           114/42 mmHg Patient Gender: F             HR:           70 bpm. Exam Location:  [facility] Procedure: 2D Echo, Cardiac  Doppler and Color Doppler Indications:     Elevated Troponin  History:         Patient has prior history of Echocardiogram examinations, most                  recent 10/12/2021. CHF, CAD; Risk Factors:Hypertension and  as: ULTRAM-ER Take 1 tablet (100 mg total) by mouth daily.   traMADol 50 MG tablet Commonly known as: ULTRAM Take 1 tablet (50 mg total) by mouth daily as needed.   Tussin Cough+Chest Congest DM 10-100 MG/5ML liquid Generic drug: dextromethorphan-guaiFENesin Take 10 mLs by mouth at bedtime as needed for cough.        Contact information for follow-up providers     [facility] Schedule an appointment as soon as possible for a visit.   Contact information: [address] [place] [address] [phone]              Contact information for after-discharge care     Destination     [facility] Preferred SNF .   Service: Skilled Nursing Contact information: [address] [place] [address] [phone]                    Discharge Exam: Evelyn Simmons Weights   11/27/22 5784  Weight:  61.7 kg   Volney Presser Oral mucosa--sore on right floor of mouth Resp clear to auscultation Cv s1 s2 normal  Condition at discharge: fair  The results of significant diagnostics from this hospitalization (including imaging, microbiology, ancillary and laboratory) are listed below for reference.   Imaging Studies: DG Knee 1-2 Views Left  Result Date: 11/28/2022 CLINICAL DATA:  Knee swelling after fall EXAM: LEFT KNEE - 1-2 VIEW COMPARISON:  None Available. FINDINGS: Status post left total knee arthroplasty. Arthroplasty components are in their expected alignment without periprosthetic lucency or fracture. No knee joint effusion. Soft tissue swelling is present medially. Advanced atherosclerotic vascular calcification. IMPRESSION: Status post left total knee arthroplasty without evidence of hardware complication. No acute findings. Electronically Signed   By: Duanne Guess D.O.   On: 11/28/2022 19:58   DG Knee 1-2 Views Right  Result Date: 11/28/2022 CLINICAL DATA:  Knee swelling, fall EXAM: RIGHT KNEE - 1-2 VIEW COMPARISON:  None Available. FINDINGS: Status post right total knee arthroplasty. Arthroplasty components are in their expected alignment without periprosthetic lucency or fracture. No joint effusion. No focal soft tissue swelling. Advanced atherosclerotic vascular calcification. IMPRESSION: Status post right total knee arthroplasty without evidence of hardware complication. Electronically Signed   By: Duanne Guess D.O.   On: 11/28/2022 19:56   ECHOCARDIOGRAM COMPLETE  Result Date: 11/28/2022    ECHOCARDIOGRAM REPORT   Patient Name:   Evelyn Simmons Date of Exam: 11/28/2022 Medical Rec #:  [id number]     Height:       61.0 in Accession #:    [id number]    Weight:       136.1 lb Date of Birth:  11-28-1929      BSA:          1.604 m Patient Age:    89 years      BP:           114/42 mmHg Patient Gender: F             HR:           70 bpm. Exam Location:  [facility] Procedure: 2D Echo, Cardiac  Doppler and Color Doppler Indications:     Elevated Troponin  History:         Patient has prior history of Echocardiogram examinations, most                  recent 10/12/2021. CHF, CAD; Risk Factors:Hypertension and  Physician Discharge Summary   Patient: Evelyn Simmons MRN: [id number] DOB: 01/10/1930  Admit date:     11/27/2022  Discharge date: 12/02/22  Discharge Physician: Enedina Finner   PCP: [facility]   Recommendations at discharge:    F/u PCP in 1-2 weeks  Discharge Diagnoses: Principal Problem:   Acute renal failure with acute tubular necrosis superimposed on stage 3 chronic kidney disease (HCC) Active Problems:   UTI (urinary tract infection)   CAD (coronary artery disease)   Hyperlipidemia   CHF (congestive heart failure) (HCC)   Chronic respiratory failure with hypoxia (HCC)   Cystitis   Falling   Anemia of chronic disease   Depression   Elevated troponin   Protein-calorie malnutrition, severe   Chronic dementia (HCC)   Fall at home   Evelyn Simmons is a 89 y.o. female with history of dementia, history of COPD, CKD 3B, GERD, insomnia, hyperlipidemia, depression, anxiety, hypertension, who was brought to the hospital because of confusion and increasing falls.  She was found to have AKI and acute UTI.     AKI on CKD stage IIIb --creat back to normal --encourage po intake    Acute UTI: Urine culture showed E. coli.. completed 5 days rx    Elevated troponins: This is likely from demand ischemia.  No further cardiac workup per cardiologist.    Chronic systolic CHF, valvular heart disease: Compensated.  2D echo on 11/28/2022 showed EF estimated at 45 to 50%, mild LVH, moderately reduced RV, moderate MR, mild AR and mild AS.Marland Kitchen  Per cardiologist, diuretics should be discontinued long-term. Close outpatient follow-up to monitor for fluid buildup and to reassess need for diuretics.    COPD, chronic hypoxemic respiratory failure: Continue bronchodilators and 2 L/min oxygen via [place]--chronic home oxygen   General Weakness, falls: PT recommended discharge to SNF.  Follow-up with TOC to assist with disposition.  Mouth sore--magic mouth wash   Nutrition Problem: Severe  Malnutrition Etiology: chronic illness (dementia)   Signs/Symptoms: moderate fat depletion, severe fat depletion, moderate muscle depletion, severe muscle depletion  Overall improving slowly. D/c to rehab  Spoke with dter at bedside    Pain control - [facility] Controlled Substance Reporting System database was reviewed. and patient was instructed, not to drive, operate heavy machinery, perform activities at heights, swimming or participation in water activities or provide baby-sitting services while on Pain, Sleep and Anxiety Medications; until their outpatient Physician has advised to do so again. Also recommended to not to take more than prescribed Pain, Sleep and Anxiety Medications.  Disposition: Rehabilitation facility Diet recommendation:  Discharge Diet Orders (From admission, onward)     Start     Ordered   12/02/22 0000  Diet - low sodium heart healthy        12/02/22 1210           Cardiac diet DISCHARGE MEDICATION: Allergies as of 12/02/2022       Reactions   Penicillins Nausea And Vomiting, Rash, Hives   Other Other (See Comments)   Sulfa Antibiotics    Pt unsure reaction, was told in hospital she had allergy   Penicillin G Rash        Medication List     STOP taking these medications    aspirin EC 81 MG tablet   cholecalciferol 1000 units tablet Commonly known as: VITAMIN D   cyanocobalamin 1000 MCG tablet   diclofenac Sodium 1 % Gel Commonly known as: VOLTAREN   furosemide 10 MG/ML injection  as: ULTRAM-ER Take 1 tablet (100 mg total) by mouth daily.   traMADol 50 MG tablet Commonly known as: ULTRAM Take 1 tablet (50 mg total) by mouth daily as needed.   Tussin Cough+Chest Congest DM 10-100 MG/5ML liquid Generic drug: dextromethorphan-guaiFENesin Take 10 mLs by mouth at bedtime as needed for cough.        Contact information for follow-up providers     [facility] Schedule an appointment as soon as possible for a visit.   Contact information: [address] [place] [address] [phone]              Contact information for after-discharge care     Destination     [facility] Preferred SNF .   Service: Skilled Nursing Contact information: [address] [place] [address] [phone]                    Discharge Exam: Evelyn Simmons Weights   11/27/22 5784  Weight:  61.7 kg   Volney Presser Oral mucosa--sore on right floor of mouth Resp clear to auscultation Cv s1 s2 normal  Condition at discharge: fair  The results of significant diagnostics from this hospitalization (including imaging, microbiology, ancillary and laboratory) are listed below for reference.   Imaging Studies: DG Knee 1-2 Views Left  Result Date: 11/28/2022 CLINICAL DATA:  Knee swelling after fall EXAM: LEFT KNEE - 1-2 VIEW COMPARISON:  None Available. FINDINGS: Status post left total knee arthroplasty. Arthroplasty components are in their expected alignment without periprosthetic lucency or fracture. No knee joint effusion. Soft tissue swelling is present medially. Advanced atherosclerotic vascular calcification. IMPRESSION: Status post left total knee arthroplasty without evidence of hardware complication. No acute findings. Electronically Signed   By: Duanne Guess D.O.   On: 11/28/2022 19:58   DG Knee 1-2 Views Right  Result Date: 11/28/2022 CLINICAL DATA:  Knee swelling, fall EXAM: RIGHT KNEE - 1-2 VIEW COMPARISON:  None Available. FINDINGS: Status post right total knee arthroplasty. Arthroplasty components are in their expected alignment without periprosthetic lucency or fracture. No joint effusion. No focal soft tissue swelling. Advanced atherosclerotic vascular calcification. IMPRESSION: Status post right total knee arthroplasty without evidence of hardware complication. Electronically Signed   By: Duanne Guess D.O.   On: 11/28/2022 19:56   ECHOCARDIOGRAM COMPLETE  Result Date: 11/28/2022    ECHOCARDIOGRAM REPORT   Patient Name:   Evelyn Simmons Date of Exam: 11/28/2022 Medical Rec #:  [id number]     Height:       61.0 in Accession #:    [id number]    Weight:       136.1 lb Date of Birth:  11-28-1929      BSA:          1.604 m Patient Age:    89 years      BP:           114/42 mmHg Patient Gender: F             HR:           70 bpm. Exam Location:  [facility] Procedure: 2D Echo, Cardiac  Doppler and Color Doppler Indications:     Elevated Troponin  History:         Patient has prior history of Echocardiogram examinations, most                  recent 10/12/2021. CHF, CAD; Risk Factors:Hypertension and  as: ULTRAM-ER Take 1 tablet (100 mg total) by mouth daily.   traMADol 50 MG tablet Commonly known as: ULTRAM Take 1 tablet (50 mg total) by mouth daily as needed.   Tussin Cough+Chest Congest DM 10-100 MG/5ML liquid Generic drug: dextromethorphan-guaiFENesin Take 10 mLs by mouth at bedtime as needed for cough.        Contact information for follow-up providers     [facility] Schedule an appointment as soon as possible for a visit.   Contact information: [address] [place] [address] [phone]              Contact information for after-discharge care     Destination     [facility] Preferred SNF .   Service: Skilled Nursing Contact information: [address] [place] [address] [phone]                    Discharge Exam: Evelyn Simmons Weights   11/27/22 5784  Weight:  61.7 kg   Volney Presser Oral mucosa--sore on right floor of mouth Resp clear to auscultation Cv s1 s2 normal  Condition at discharge: fair  The results of significant diagnostics from this hospitalization (including imaging, microbiology, ancillary and laboratory) are listed below for reference.   Imaging Studies: DG Knee 1-2 Views Left  Result Date: 11/28/2022 CLINICAL DATA:  Knee swelling after fall EXAM: LEFT KNEE - 1-2 VIEW COMPARISON:  None Available. FINDINGS: Status post left total knee arthroplasty. Arthroplasty components are in their expected alignment without periprosthetic lucency or fracture. No knee joint effusion. Soft tissue swelling is present medially. Advanced atherosclerotic vascular calcification. IMPRESSION: Status post left total knee arthroplasty without evidence of hardware complication. No acute findings. Electronically Signed   By: Duanne Guess D.O.   On: 11/28/2022 19:58   DG Knee 1-2 Views Right  Result Date: 11/28/2022 CLINICAL DATA:  Knee swelling, fall EXAM: RIGHT KNEE - 1-2 VIEW COMPARISON:  None Available. FINDINGS: Status post right total knee arthroplasty. Arthroplasty components are in their expected alignment without periprosthetic lucency or fracture. No joint effusion. No focal soft tissue swelling. Advanced atherosclerotic vascular calcification. IMPRESSION: Status post right total knee arthroplasty without evidence of hardware complication. Electronically Signed   By: Duanne Guess D.O.   On: 11/28/2022 19:56   ECHOCARDIOGRAM COMPLETE  Result Date: 11/28/2022    ECHOCARDIOGRAM REPORT   Patient Name:   Evelyn Simmons Date of Exam: 11/28/2022 Medical Rec #:  [id number]     Height:       61.0 in Accession #:    [id number]    Weight:       136.1 lb Date of Birth:  11-28-1929      BSA:          1.604 m Patient Age:    89 years      BP:           114/42 mmHg Patient Gender: F             HR:           70 bpm. Exam Location:  [facility] Procedure: 2D Echo, Cardiac  Doppler and Color Doppler Indications:     Elevated Troponin  History:         Patient has prior history of Echocardiogram examinations, most                  recent 10/12/2021. CHF, CAD; Risk Factors:Hypertension and  Physician Discharge Summary   Patient: Evelyn Simmons MRN: [id number] DOB: 01/10/1930  Admit date:     11/27/2022  Discharge date: 12/02/22  Discharge Physician: Enedina Finner   PCP: [facility] [place]   Recommendations at discharge:    F/u PCP in 1-2 weeks  Discharge Diagnoses: Principal Problem:   Acute renal failure with acute tubular necrosis superimposed on stage 3 chronic kidney disease (HCC) Active Problems:   UTI (urinary tract infection)   CAD (coronary artery disease)   Hyperlipidemia   CHF (congestive heart failure) (HCC)   Chronic respiratory failure with hypoxia (HCC)   Cystitis   Falling   Anemia of chronic disease   Depression   Elevated troponin   Protein-calorie malnutrition, severe   Chronic dementia (HCC)   Fall at home   Evelyn Simmons is a 89 y.o. female with history of dementia, history of COPD, CKD 3B, GERD, insomnia, hyperlipidemia, depression, anxiety, hypertension, who was brought to the hospital because of confusion and increasing falls.  She was found to have AKI and acute UTI.     AKI on CKD stage IIIb --creat back to normal --encourage po intake    Acute UTI: Urine culture showed E. coli.. completed 5 days rx    Elevated troponins: This is likely from demand ischemia.  No further cardiac workup per cardiologist.    Chronic systolic CHF, valvular heart disease: Compensated.  2D echo on 11/28/2022 showed EF estimated at 45 to 50%, mild LVH, moderately reduced RV, moderate MR, mild AR and mild AS.Marland Kitchen  Per cardiologist, diuretics should be discontinued long-term. Close outpatient follow-up to monitor for fluid buildup and to reassess need for diuretics.    COPD, chronic hypoxemic respiratory failure: Continue bronchodilators and 2 L/min oxygen via [place]--chronic home oxygen   General Weakness, falls: PT recommended discharge to SNF.  Follow-up with TOC to assist with disposition.  Mouth sore--magic mouth wash   Nutrition Problem: Severe  Malnutrition Etiology: chronic illness (dementia)   Signs/Symptoms: moderate fat depletion, severe fat depletion, moderate muscle depletion, severe muscle depletion  Overall improving slowly. D/c to rehab  Spoke with dter at bedside    Pain control - [facility] Controlled Substance Reporting System database was reviewed. and patient was instructed, not to drive, operate heavy machinery, perform activities at heights, swimming or participation in water activities or provide baby-sitting services while on Pain, Sleep and Anxiety Medications; until their outpatient Physician has advised to do so again. Also recommended to not to take more than prescribed Pain, Sleep and Anxiety Medications.  Disposition: Rehabilitation facility Diet recommendation:  Discharge Diet Orders (From admission, onward)     Start     Ordered   12/02/22 0000  Diet - low sodium heart healthy        12/02/22 1210           Cardiac diet DISCHARGE MEDICATION: Allergies as of 12/02/2022       Reactions   Penicillins Nausea And Vomiting, Rash, Hives   Other Other (See Comments)   Sulfa Antibiotics    Pt unsure reaction, was told in hospital she had allergy   Penicillin G Rash        Medication List     STOP taking these medications    aspirin EC 81 MG tablet   cholecalciferol 1000 units tablet Commonly known as: VITAMIN D   cyanocobalamin 1000 MCG tablet   diclofenac Sodium 1 % Gel Commonly known as: VOLTAREN   furosemide 10 MG/ML injection

## 2022-12-02 NOTE — Progress Notes (Signed)
Report given to Nurse Alvira Philips from [facility]

## 2022-12-03 ENCOUNTER — Telehealth: Payer: Self-pay | Admitting: Internal Medicine

## 2022-12-03 NOTE — Telephone Encounter (Signed)
Pt daughter called in from a VM she received about pt missing her transmission. She states pt is in a rehab facility now and she is no where near the device. She is unsure how to move forward, please advise.

## 2022-12-04 NOTE — Telephone Encounter (Signed)
LMOVM for pt daughter to take monitor to Rehab facility.

## 2023-03-15 DEATH — deceased

## 2024-03-14 IMAGING — DX DG CHEST 1V PORT
1 series · 1 of 1 positions shown · non-contrast
Comparison: Chest x-ray 07/25/2020

CLINICAL DATA: Chest pain

EXAM:
PORTABLE CHEST 1 VIEW

[chest ap]
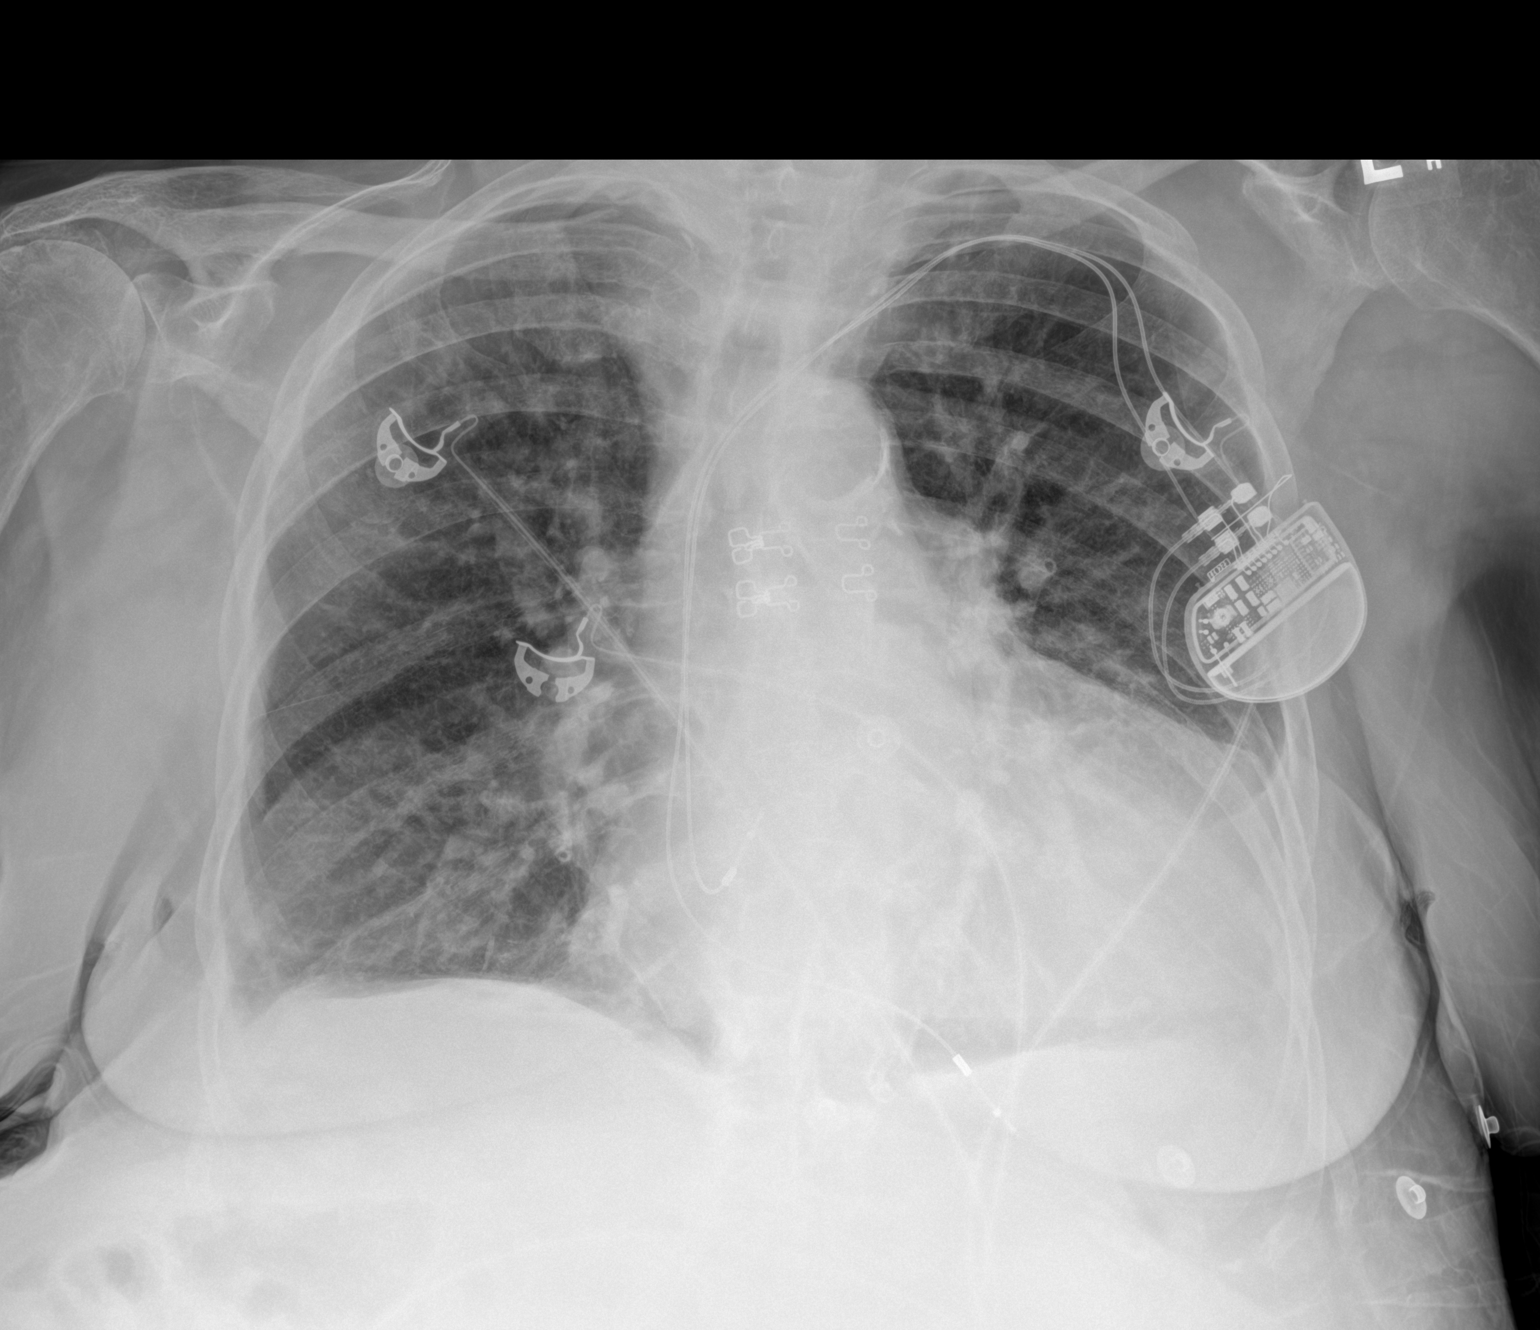

[1 of 1 positions shown; findings below may reference images not displayed]

FINDINGS: Heart is enlarged. Mediastinum appears stable. Calcified plaques in
the thoracic aorta. Left-sided cardiac pacemaker device. Mild
pulmonary vascular prominence with no focal consolidation
identified. No large pleural effusion visualized, trace effusions
not excluded. No pneumothorax.
IMPRESSION: Cardiomegaly and pulmonary vascular congestion.

## 2024-03-14 IMAGING — CT CT HEAD W/O CM
4 series · 16 of 47 positions shown, 18 images · non-contrast
Comparison: 11/07/2020

CLINICAL DATA: Headache new or worsening. Symptoms began 1555
hours.



[Series 2: head bone · axial · 0.40mm/px · z∈[+1534,+1564]mm · 3 of 76 slices shown]
[im 8/76  bone]
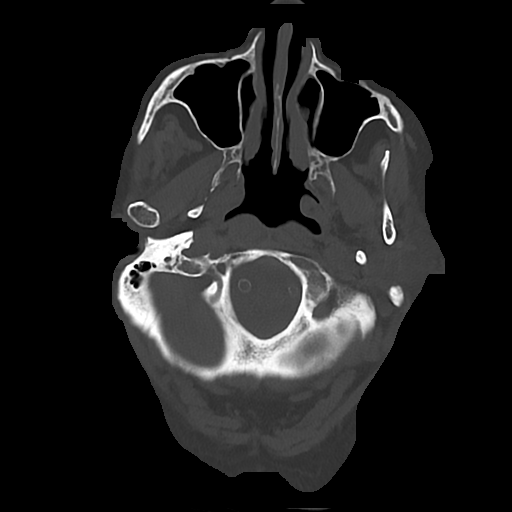
[im 16/76  bone]
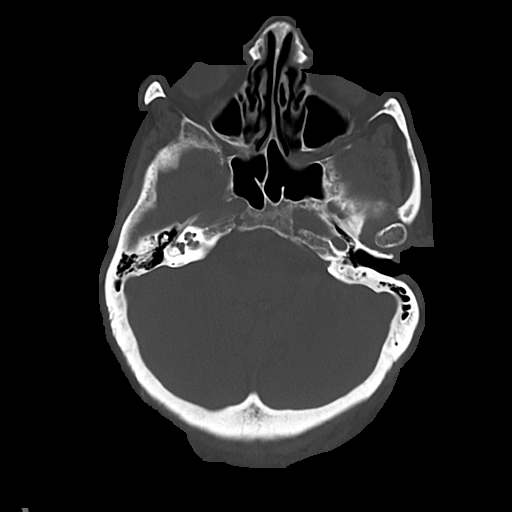
[im 23/76  bone]
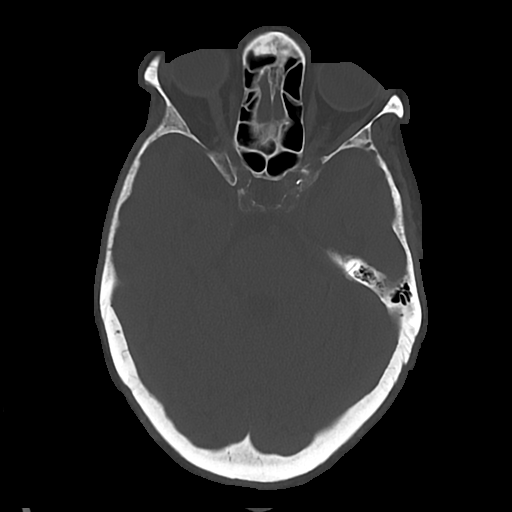

[Series 3: cor soft · coronal · 0.31mm/px · 3 of 72 slices shown]
[im 24/72  brain]
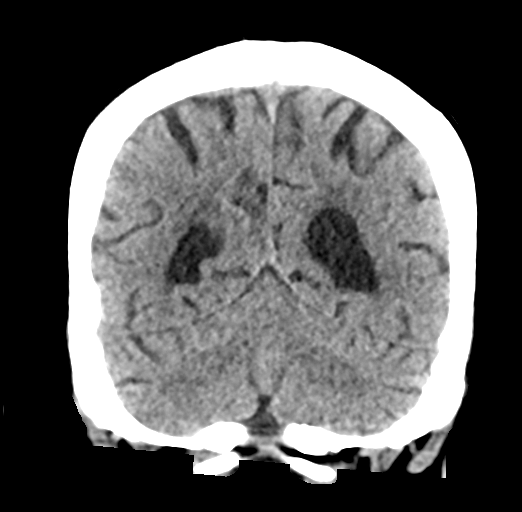
[im 32/72  brain]
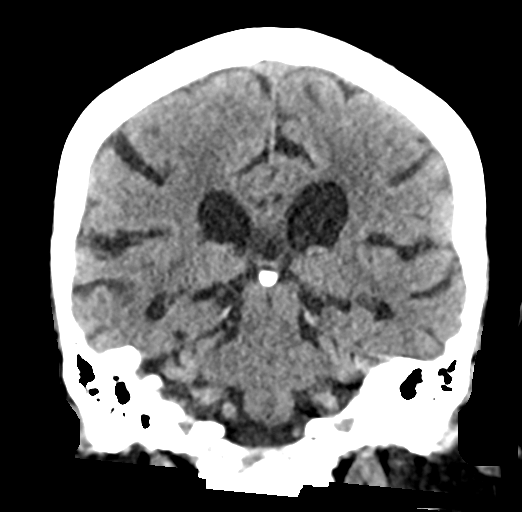
[im 40/72  brain]
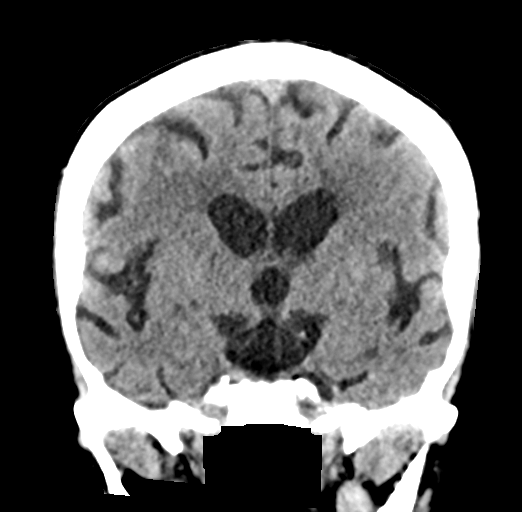

[Series 4: sag soft · sagittal · 0.31mm/px · 3 of 60 slices shown]
[im 20/60  brain]
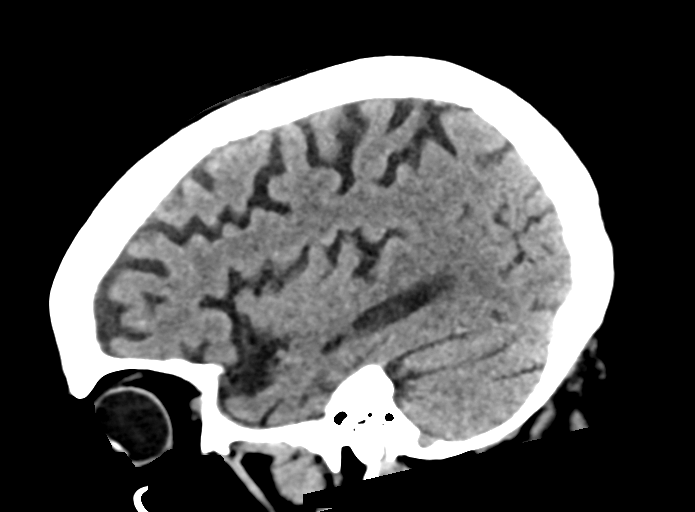
[im 30/60  brain]
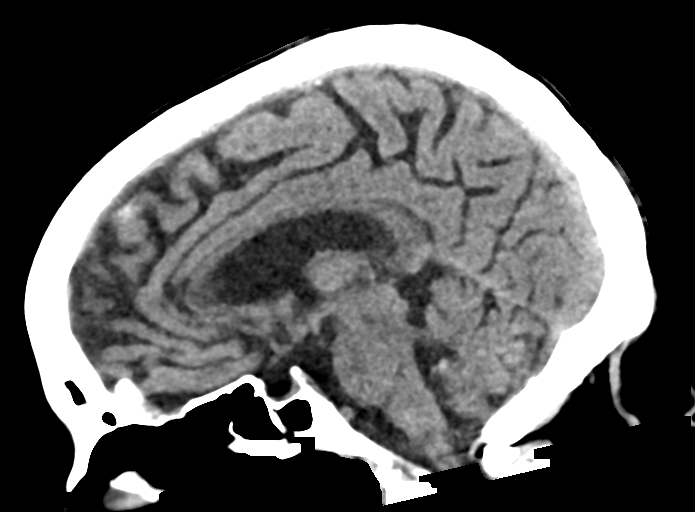
[im 40/60  brain]
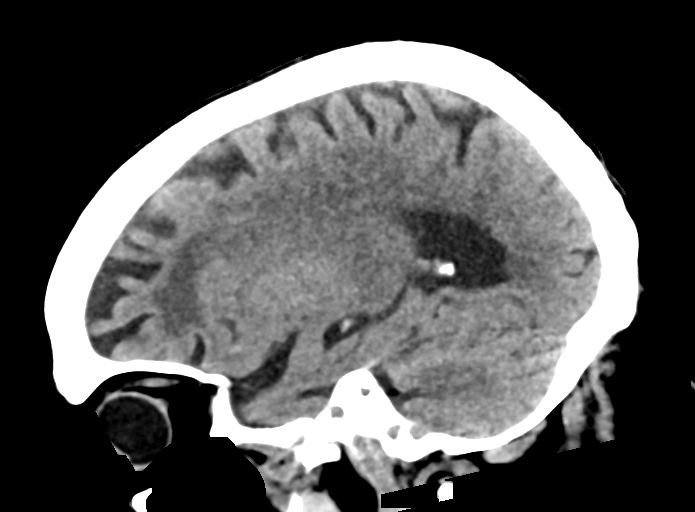

[Series 5: head wo · axial · 0.40mm/px · z∈[+1535,+1650]mm · 7 of 31 slices shown, 9 images]
[im 4/31  brain]
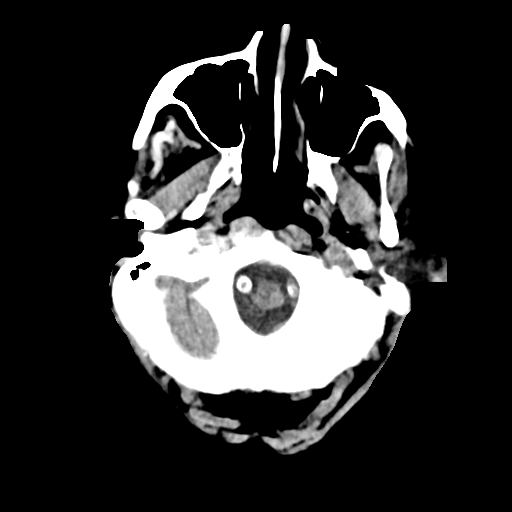
[im 4/31  bone]
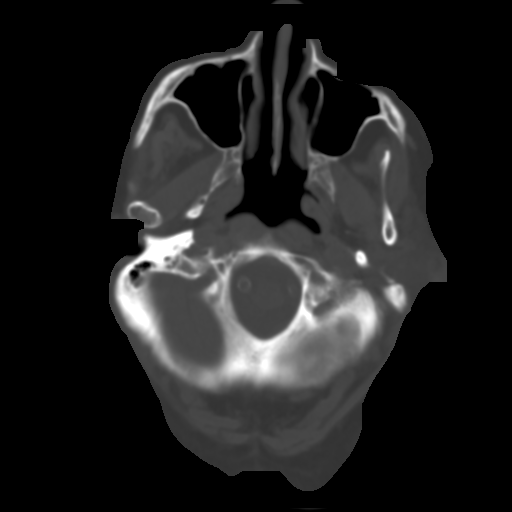
[im 8/31  brain]
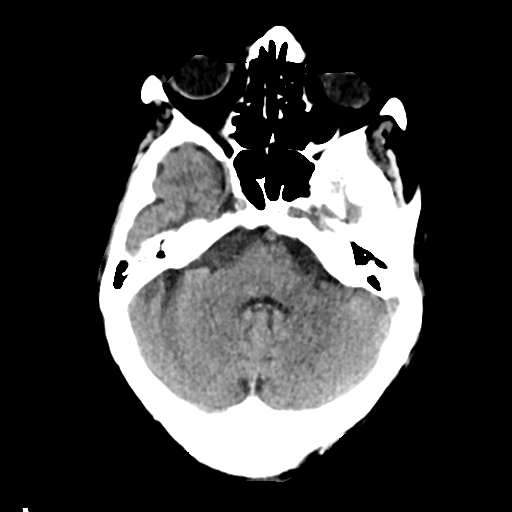
[im 12/31  brain]
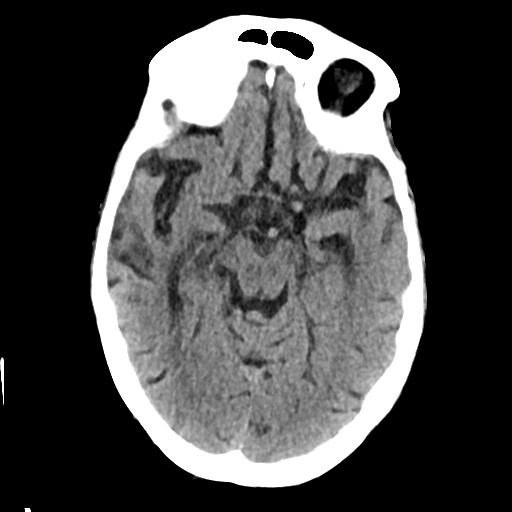
[im 16/31  brain]
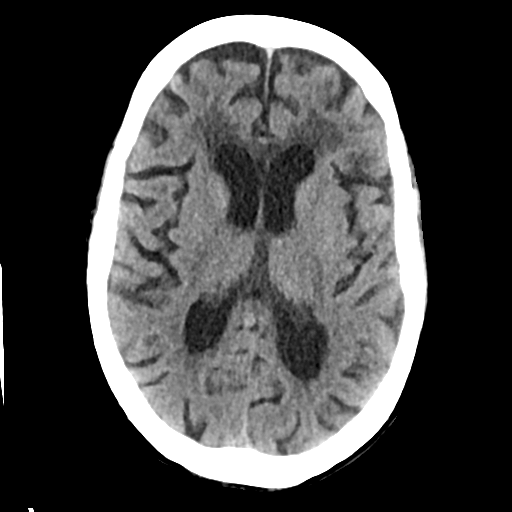
[im 19/31  brain]
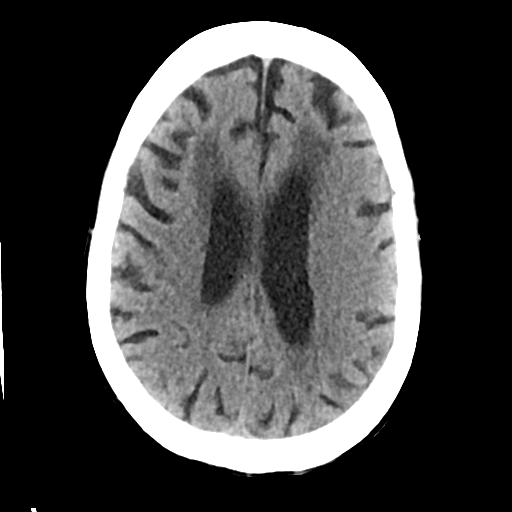
[im 19/31  bone]
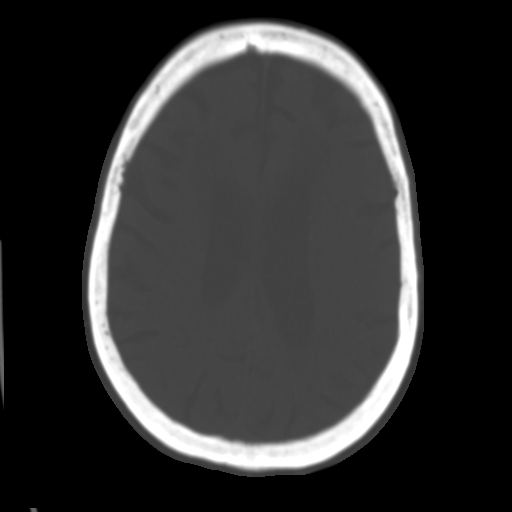
[im 23/31  brain]
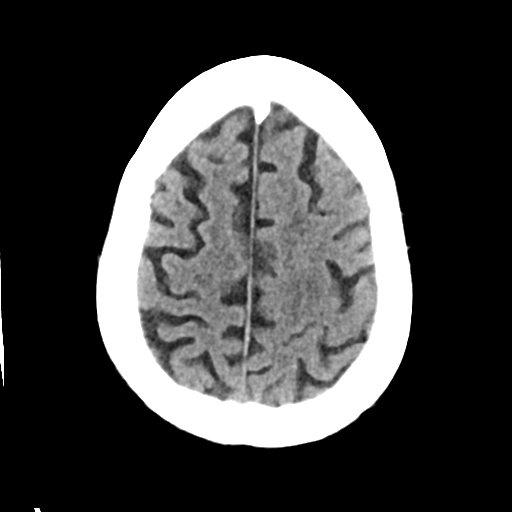
[im 27/31  brain]
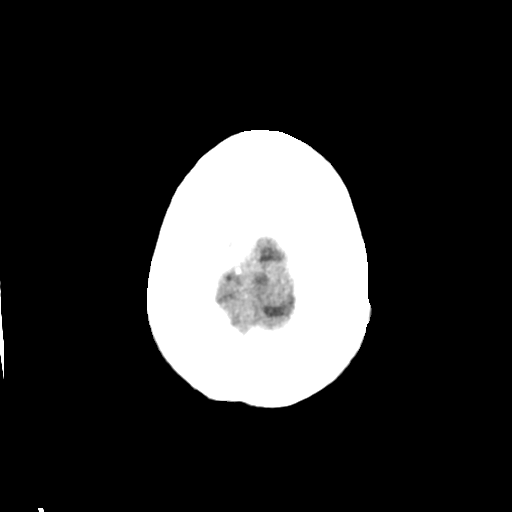

[16 of 47 positions shown; findings below may reference images not displayed]

FINDINGS: Brain: Generalized atrophy. Chronic small-vessel ischemic changes of
the cerebral hemispheric white matter. No sign of acute infarction,
mass lesion, hemorrhage, hydrocephalus or extra-axial collection.

Vascular: There is atherosclerotic calcification of the major
vessels at the base of the brain.

Skull: Negative

Sinuses/Orbits: Clear/normal

Other: None
IMPRESSION: No acute finding. Age related atrophy. Chronic small-vessel ischemic
changes of the cerebral hemispheric white matter.
